# Patient Record
Sex: Female | Born: 1960
Health system: Southern US, Community
[De-identification: ages and names within clinical notes are randomized; demographics above are authoritative.]

## PROBLEM LIST (undated history)

## (undated) DIAGNOSIS — K449 Diaphragmatic hernia without obstruction or gangrene: Secondary | ICD-10-CM

## (undated) DIAGNOSIS — B351 Tinea unguium: Secondary | ICD-10-CM

## (undated) DIAGNOSIS — R630 Anorexia: Secondary | ICD-10-CM

## (undated) DIAGNOSIS — F329 Major depressive disorder, single episode, unspecified: Secondary | ICD-10-CM

## (undated) DIAGNOSIS — F419 Anxiety disorder, unspecified: Secondary | ICD-10-CM

## (undated) DIAGNOSIS — K562 Volvulus: Secondary | ICD-10-CM

## (undated) DIAGNOSIS — I8390 Asymptomatic varicose veins of unspecified lower extremity: Secondary | ICD-10-CM

## (undated) DIAGNOSIS — K589 Irritable bowel syndrome without diarrhea: Secondary | ICD-10-CM

## (undated) DIAGNOSIS — Z9889 Other specified postprocedural states: Secondary | ICD-10-CM

## (undated) DIAGNOSIS — I83893 Varicose veins of bilateral lower extremities with other complications: Secondary | ICD-10-CM

## (undated) DIAGNOSIS — D72820 Lymphocytosis (symptomatic): Secondary | ICD-10-CM

## (undated) DIAGNOSIS — R7303 Prediabetes: Secondary | ICD-10-CM

## (undated) DIAGNOSIS — F32A Depression, unspecified: Secondary | ICD-10-CM

## (undated) DIAGNOSIS — Z460 Encounter for fitting and adjustment of spectacles and contact lenses: Secondary | ICD-10-CM

## (undated) DIAGNOSIS — T7840XA Allergy, unspecified, initial encounter: Secondary | ICD-10-CM

## (undated) DIAGNOSIS — Z8669 Personal history of other diseases of the nervous system and sense organs: Secondary | ICD-10-CM

## (undated) DIAGNOSIS — G4730 Sleep apnea, unspecified: Secondary | ICD-10-CM

## (undated) DIAGNOSIS — K219 Gastro-esophageal reflux disease without esophagitis: Secondary | ICD-10-CM

## (undated) DIAGNOSIS — M549 Dorsalgia, unspecified: Secondary | ICD-10-CM

## (undated) DIAGNOSIS — R5383 Other fatigue: Secondary | ICD-10-CM

## (undated) DIAGNOSIS — Z8679 Personal history of other diseases of the circulatory system: Secondary | ICD-10-CM

## (undated) DIAGNOSIS — R32 Unspecified urinary incontinence: Secondary | ICD-10-CM

## (undated) HISTORY — DX: Anxiety disorder, unspecified: F41.9

## (undated) HISTORY — DX: Lymphocytosis (symptomatic): D72.820

## (undated) HISTORY — DX: Unspecified urinary incontinence: R32

## (undated) HISTORY — DX: Other specified postprocedural states: Z98.890

## (undated) HISTORY — DX: Irritable bowel syndrome, unspecified: K58.9

## (undated) HISTORY — PX: NASAL SEPTUM SURGERY: SHX37

## (undated) HISTORY — DX: Personal history of other diseases of the circulatory system: Z86.79

## (undated) HISTORY — DX: Prediabetes: R73.03

## (undated) HISTORY — DX: Gastro-esophageal reflux disease without esophagitis: K21.9

## (undated) HISTORY — DX: Volvulus: K56.2

## (undated) HISTORY — DX: Asymptomatic varicose veins of unspecified lower extremity: I83.90

## (undated) HISTORY — DX: Varicose veins of bilateral lower extremities with other complications: I83.893

## (undated) HISTORY — PX: UPPER GASTROINTESTINAL ENDOSCOPY: SHX188

## (undated) HISTORY — DX: Dorsalgia, unspecified: M54.9

## (undated) HISTORY — PX: EYE SURGERY: SHX253

## (undated) HISTORY — DX: Allergy, unspecified, initial encounter: T78.40XA

## (undated) HISTORY — DX: Encounter for fitting and adjustment of spectacles and contact lenses: Z46.0

## (undated) HISTORY — DX: Other fatigue: R53.83

## (undated) HISTORY — DX: Tinea unguium: B35.1

## (undated) HISTORY — DX: Anorexia: R63.0

## (undated) HISTORY — DX: Personal history of other diseases of the nervous system and sense organs: Z86.69

## (undated) HISTORY — DX: Diaphragmatic hernia without obstruction or gangrene: K44.9

## (undated) HISTORY — PX: COLONOSCOPY: SHX174

## (undated) HISTORY — DX: Sleep apnea, unspecified: G47.30

## (undated) HISTORY — PX: BALLOON SINUPLASTY: SHX5740

---

## 1987-12-13 DIAGNOSIS — Z8679 Personal history of other diseases of the circulatory system: Secondary | ICD-10-CM

## 1987-12-13 DIAGNOSIS — Z8774 Personal history of (corrected) congenital malformations of heart and circulatory system: Secondary | ICD-10-CM

## 1987-12-13 HISTORY — DX: Personal history of other diseases of the circulatory system: Z86.79

## 1987-12-13 HISTORY — DX: Personal history of (corrected) congenital malformations of heart and circulatory system: Z87.74

## 1988-01-08 DIAGNOSIS — Q211 Atrial septal defect: Secondary | ICD-10-CM | POA: Insufficient documentation

## 1988-01-08 DIAGNOSIS — Q2111 Secundum atrial septal defect: Secondary | ICD-10-CM | POA: Insufficient documentation

## 1988-12-12 HISTORY — PX: ASD REPAIR, SECUNDUM: SHX1195

## 1999-06-07 ENCOUNTER — Ambulatory Visit (HOSPITAL_COMMUNITY): Admission: RE | Admit: 1999-06-07 | Discharge: 1999-06-07 | Payer: Self-pay | Admitting: *Deleted

## 2000-04-17 ENCOUNTER — Other Ambulatory Visit: Admission: RE | Admit: 2000-04-17 | Discharge: 2000-04-17 | Payer: Self-pay | Admitting: Gynecology

## 2001-05-01 ENCOUNTER — Other Ambulatory Visit: Admission: RE | Admit: 2001-05-01 | Discharge: 2001-05-01 | Payer: Self-pay | Admitting: Gynecology

## 2001-05-22 ENCOUNTER — Encounter: Admission: RE | Admit: 2001-05-22 | Discharge: 2001-05-22 | Payer: Self-pay | Admitting: Gynecology

## 2001-05-22 ENCOUNTER — Encounter: Payer: Self-pay | Admitting: Gynecology

## 2002-07-30 ENCOUNTER — Other Ambulatory Visit: Admission: RE | Admit: 2002-07-30 | Discharge: 2002-07-30 | Payer: Self-pay | Admitting: *Deleted

## 2002-10-24 ENCOUNTER — Encounter: Admission: RE | Admit: 2002-10-24 | Discharge: 2002-10-24 | Payer: Self-pay | Admitting: *Deleted

## 2002-10-24 ENCOUNTER — Encounter: Payer: Self-pay | Admitting: *Deleted

## 2002-12-12 HISTORY — PX: ABDOMINAL HYSTERECTOMY: SHX81

## 2003-01-28 ENCOUNTER — Observation Stay (HOSPITAL_COMMUNITY): Admission: RE | Admit: 2003-01-28 | Discharge: 2003-01-29 | Payer: Self-pay | Admitting: *Deleted

## 2003-01-28 ENCOUNTER — Encounter (INDEPENDENT_AMBULATORY_CARE_PROVIDER_SITE_OTHER): Payer: Self-pay

## 2003-04-23 ENCOUNTER — Ambulatory Visit (HOSPITAL_COMMUNITY): Admission: RE | Admit: 2003-04-23 | Discharge: 2003-04-23 | Payer: Self-pay | Admitting: Gastroenterology

## 2003-04-23 ENCOUNTER — Encounter: Payer: Self-pay | Admitting: Gastroenterology

## 2003-08-05 ENCOUNTER — Other Ambulatory Visit: Admission: RE | Admit: 2003-08-05 | Discharge: 2003-08-05 | Payer: Self-pay | Admitting: *Deleted

## 2003-10-30 ENCOUNTER — Ambulatory Visit (HOSPITAL_COMMUNITY): Admission: RE | Admit: 2003-10-30 | Discharge: 2003-10-30 | Payer: Self-pay | Admitting: *Deleted

## 2004-08-05 ENCOUNTER — Other Ambulatory Visit: Admission: RE | Admit: 2004-08-05 | Discharge: 2004-08-05 | Payer: Self-pay | Admitting: *Deleted

## 2004-10-28 ENCOUNTER — Ambulatory Visit: Payer: Self-pay | Admitting: Pulmonary Disease

## 2004-11-11 ENCOUNTER — Ambulatory Visit (HOSPITAL_COMMUNITY): Admission: RE | Admit: 2004-11-11 | Discharge: 2004-11-11 | Payer: Self-pay | Admitting: *Deleted

## 2004-11-24 ENCOUNTER — Ambulatory Visit: Payer: Self-pay | Admitting: Pulmonary Disease

## 2004-12-01 ENCOUNTER — Ambulatory Visit: Payer: Self-pay | Admitting: Pulmonary Disease

## 2005-01-03 ENCOUNTER — Ambulatory Visit: Payer: Self-pay | Admitting: Pulmonary Disease

## 2005-01-04 ENCOUNTER — Ambulatory Visit: Payer: Self-pay | Admitting: Pulmonary Disease

## 2005-01-06 ENCOUNTER — Ambulatory Visit: Payer: Self-pay | Admitting: Pulmonary Disease

## 2005-01-06 ENCOUNTER — Ambulatory Visit: Payer: Self-pay | Admitting: Gastroenterology

## 2005-08-10 ENCOUNTER — Other Ambulatory Visit: Admission: RE | Admit: 2005-08-10 | Discharge: 2005-08-10 | Payer: Self-pay | Admitting: *Deleted

## 2005-09-19 ENCOUNTER — Ambulatory Visit: Payer: Self-pay | Admitting: Pulmonary Disease

## 2005-09-21 ENCOUNTER — Ambulatory Visit (HOSPITAL_COMMUNITY): Admission: RE | Admit: 2005-09-21 | Discharge: 2005-09-21 | Payer: Self-pay | Admitting: Pulmonary Disease

## 2005-09-29 ENCOUNTER — Ambulatory Visit: Payer: Self-pay | Admitting: Pulmonary Disease

## 2006-01-02 ENCOUNTER — Encounter: Admission: RE | Admit: 2006-01-02 | Discharge: 2006-01-02 | Payer: Self-pay | Admitting: *Deleted

## 2006-02-21 ENCOUNTER — Ambulatory Visit: Payer: Self-pay | Admitting: Pulmonary Disease

## 2006-02-28 ENCOUNTER — Ambulatory Visit: Payer: Self-pay | Admitting: Gastroenterology

## 2006-04-11 ENCOUNTER — Ambulatory Visit: Payer: Self-pay | Admitting: Gastroenterology

## 2006-04-11 DIAGNOSIS — K562 Volvulus: Secondary | ICD-10-CM

## 2006-04-11 HISTORY — DX: Volvulus: K56.2

## 2006-05-03 ENCOUNTER — Emergency Department (HOSPITAL_COMMUNITY): Admission: EM | Admit: 2006-05-03 | Discharge: 2006-05-04 | Payer: Self-pay | Admitting: Emergency Medicine

## 2006-05-22 ENCOUNTER — Ambulatory Visit: Payer: Self-pay | Admitting: Gastroenterology

## 2006-08-16 ENCOUNTER — Other Ambulatory Visit: Admission: RE | Admit: 2006-08-16 | Discharge: 2006-08-16 | Payer: Self-pay | Admitting: *Deleted

## 2007-01-03 ENCOUNTER — Encounter: Admission: RE | Admit: 2007-01-03 | Discharge: 2007-01-03 | Payer: Self-pay | Admitting: *Deleted

## 2007-03-07 ENCOUNTER — Ambulatory Visit: Payer: Self-pay | Admitting: Pulmonary Disease

## 2007-03-07 LAB — CONVERTED CEMR LAB
ALT: 26 units/L (ref 0–40)
Albumin: 3.9 g/dL (ref 3.5–5.2)
Alkaline Phosphatase: 35 units/L — ABNORMAL LOW (ref 39–117)
BUN: 12 mg/dL (ref 6–23)
Basophils Absolute: 0.1 10*3/uL (ref 0.0–0.1)
Bilirubin Urine: NEGATIVE
Bilirubin, Direct: 0.2 mg/dL (ref 0.0–0.3)
CO2: 32 meq/L (ref 19–32)
Calcium: 9.2 mg/dL (ref 8.4–10.5)
Creatinine, Ser: 0.7 mg/dL (ref 0.4–1.2)
Eosinophils Absolute: 0.1 10*3/uL (ref 0.0–0.6)
Eosinophils Relative: 2.4 % (ref 0.0–5.0)
Hemoglobin: 14.5 g/dL (ref 12.0–15.0)
MCHC: 33.6 g/dL (ref 30.0–36.0)
Monocytes Relative: 9.8 % (ref 3.0–11.0)
Neutrophils Relative %: 62.4 % (ref 43.0–77.0)
Nitrite: NEGATIVE
Platelets: 333 10*3/uL (ref 150–400)
RBC: 4.77 M/uL (ref 3.87–5.11)
Sodium: 144 meq/L (ref 135–145)
Total Protein: 6.8 g/dL (ref 6.0–8.3)
Urine Glucose: NEGATIVE mg/dL
Urobilinogen, UA: 0.2 (ref 0.0–1.0)

## 2007-08-30 ENCOUNTER — Other Ambulatory Visit: Admission: RE | Admit: 2007-08-30 | Discharge: 2007-08-30 | Payer: Self-pay | Admitting: *Deleted

## 2007-09-05 ENCOUNTER — Ambulatory Visit: Payer: Self-pay | Admitting: Vascular Surgery

## 2007-10-05 ENCOUNTER — Ambulatory Visit: Payer: Self-pay | Admitting: Gastroenterology

## 2007-10-29 ENCOUNTER — Ambulatory Visit: Payer: Self-pay | Admitting: Pulmonary Disease

## 2007-10-29 LAB — CONVERTED CEMR LAB
ALT: 24 units/L (ref 0–35)
Albumin: 3.9 g/dL (ref 3.5–5.2)
Alkaline Phosphatase: 32 units/L — ABNORMAL LOW (ref 39–117)
Basophils Absolute: 0.1 10*3/uL (ref 0.0–0.1)
Chloride: 105 meq/L (ref 96–112)
Eosinophils Absolute: 0.1 10*3/uL (ref 0.0–0.6)
Eosinophils Relative: 1.2 % (ref 0.0–5.0)
Folate: 17 ng/mL
GFR calc Af Amer: 115 mL/min
GFR calc non Af Amer: 95 mL/min
HCT: 41 % (ref 36.0–46.0)
Hemoglobin: 14.5 g/dL (ref 12.0–15.0)
Lymphocytes Relative: 27 % (ref 12.0–46.0)
Neutro Abs: 3.9 10*3/uL (ref 1.4–7.7)
Neutrophils Relative %: 60.9 % (ref 43.0–77.0)
Platelets: 331 10*3/uL (ref 150–400)
Potassium: 3.8 meq/L (ref 3.5–5.1)
RBC: 4.42 M/uL (ref 3.87–5.11)
Sodium: 140 meq/L (ref 135–145)

## 2007-11-02 ENCOUNTER — Ambulatory Visit: Payer: Self-pay | Admitting: Vascular Surgery

## 2007-11-21 ENCOUNTER — Ambulatory Visit: Payer: Self-pay | Admitting: Vascular Surgery

## 2007-11-23 ENCOUNTER — Ambulatory Visit: Payer: Self-pay | Admitting: Vascular Surgery

## 2007-12-13 HISTORY — PX: OTHER SURGICAL HISTORY: SHX169

## 2008-01-04 ENCOUNTER — Ambulatory Visit: Payer: Self-pay | Admitting: Vascular Surgery

## 2008-01-07 ENCOUNTER — Telehealth (INDEPENDENT_AMBULATORY_CARE_PROVIDER_SITE_OTHER): Payer: Self-pay | Admitting: *Deleted

## 2008-01-07 DIAGNOSIS — F419 Anxiety disorder, unspecified: Secondary | ICD-10-CM | POA: Insufficient documentation

## 2008-01-07 DIAGNOSIS — Z87898 Personal history of other specified conditions: Secondary | ICD-10-CM | POA: Insufficient documentation

## 2008-01-07 DIAGNOSIS — K589 Irritable bowel syndrome without diarrhea: Secondary | ICD-10-CM | POA: Insufficient documentation

## 2008-01-08 ENCOUNTER — Ambulatory Visit: Payer: Self-pay | Admitting: Pulmonary Disease

## 2008-01-08 DIAGNOSIS — M79609 Pain in unspecified limb: Secondary | ICD-10-CM | POA: Insufficient documentation

## 2008-01-08 DIAGNOSIS — K562 Volvulus: Secondary | ICD-10-CM | POA: Insufficient documentation

## 2008-01-08 DIAGNOSIS — I8390 Asymptomatic varicose veins of unspecified lower extremity: Secondary | ICD-10-CM | POA: Insufficient documentation

## 2008-02-29 ENCOUNTER — Ambulatory Visit: Payer: Self-pay | Admitting: Vascular Surgery

## 2008-03-06 ENCOUNTER — Ambulatory Visit: Payer: Self-pay | Admitting: Pulmonary Disease

## 2008-03-06 DIAGNOSIS — N809 Endometriosis, unspecified: Secondary | ICD-10-CM | POA: Insufficient documentation

## 2008-03-14 ENCOUNTER — Telehealth: Payer: Self-pay | Admitting: Pulmonary Disease

## 2008-03-23 LAB — CONVERTED CEMR LAB
Bilirubin Urine: NEGATIVE
Crystals: NEGATIVE
Hemoglobin, Urine: NEGATIVE
Nitrite: NEGATIVE
RBC / HPF: NONE SEEN
Specific Gravity, Urine: 1.01 (ref 1.000–1.03)
Total CHOL/HDL Ratio: 3.8
Urine Glucose: NEGATIVE mg/dL
Urobilinogen, UA: 0.2 (ref 0.0–1.0)
VLDL: 11 mg/dL (ref 0–40)

## 2008-03-28 ENCOUNTER — Encounter: Payer: Self-pay | Admitting: Pulmonary Disease

## 2008-04-04 ENCOUNTER — Ambulatory Visit (HOSPITAL_COMMUNITY): Admission: RE | Admit: 2008-04-04 | Discharge: 2008-04-04 | Payer: Self-pay | Admitting: *Deleted

## 2008-04-18 ENCOUNTER — Ambulatory Visit: Payer: Self-pay | Admitting: Vascular Surgery

## 2008-08-22 ENCOUNTER — Ambulatory Visit: Payer: Self-pay | Admitting: Gastroenterology

## 2008-08-22 DIAGNOSIS — R197 Diarrhea, unspecified: Secondary | ICD-10-CM | POA: Insufficient documentation

## 2008-09-17 ENCOUNTER — Ambulatory Visit: Payer: Self-pay | Admitting: Pulmonary Disease

## 2008-10-23 ENCOUNTER — Other Ambulatory Visit: Admission: RE | Admit: 2008-10-23 | Discharge: 2008-10-23 | Payer: Self-pay | Admitting: Gynecology

## 2008-12-12 HISTORY — PX: OTHER SURGICAL HISTORY: SHX169

## 2009-01-27 ENCOUNTER — Telehealth: Payer: Self-pay | Admitting: Pulmonary Disease

## 2009-04-14 ENCOUNTER — Ambulatory Visit: Payer: Self-pay | Admitting: Pulmonary Disease

## 2009-04-17 ENCOUNTER — Ambulatory Visit: Payer: Self-pay | Admitting: Pulmonary Disease

## 2009-04-17 LAB — CONVERTED CEMR LAB
ALT: 24 units/L (ref 0–35)
AST: 23 units/L (ref 0–37)
Albumin: 3.8 g/dL (ref 3.5–5.2)
Basophils Relative: 0.1 % (ref 0.0–3.0)
Bilirubin, Direct: 0.3 mg/dL (ref 0.0–0.3)
Calcium: 8.8 mg/dL (ref 8.4–10.5)
Chloride: 106 meq/L (ref 96–112)
Creatinine, Ser: 0.8 mg/dL (ref 0.4–1.2)
Eosinophils Relative: 3.5 % (ref 0.0–5.0)
HDL: 42.5 mg/dL (ref >39.00)
Leukocytes, UA: NEGATIVE
Lymphocytes Relative: 18.4 % (ref 12.0–46.0)
MCHC: 34 g/dL (ref 30.0–36.0)
MCV: 94.3 fL (ref 78.0–100.0)
Monocytes Relative: 7.6 % (ref 3.0–12.0)
Neutro Abs: 5.8 10*3/uL (ref 1.4–7.7)
Platelets: 282 10*3/uL (ref 150.0–400.0)
RBC: 4.41 M/uL (ref 3.87–5.11)
RDW: 12.2 % (ref 11.5–14.6)
Total CHOL/HDL Ratio: 3
Total Protein, Urine: NEGATIVE mg/dL
Triglycerides: 45 mg/dL (ref 0.0–149.0)
Urine Glucose: NEGATIVE mg/dL
Urobilinogen, UA: 0.2 (ref 0.0–1.0)
VLDL: 9 mg/dL (ref 0.0–40.0)

## 2009-04-22 ENCOUNTER — Ambulatory Visit (HOSPITAL_COMMUNITY): Admission: RE | Admit: 2009-04-22 | Discharge: 2009-04-22 | Payer: Self-pay | Admitting: Gynecology

## 2009-08-31 ENCOUNTER — Telehealth: Payer: Self-pay | Admitting: Pulmonary Disease

## 2009-09-21 ENCOUNTER — Telehealth (INDEPENDENT_AMBULATORY_CARE_PROVIDER_SITE_OTHER): Payer: Self-pay | Admitting: *Deleted

## 2009-10-08 ENCOUNTER — Ambulatory Visit: Payer: Self-pay | Admitting: Pulmonary Disease

## 2009-11-10 ENCOUNTER — Telehealth: Payer: Self-pay | Admitting: Pulmonary Disease

## 2009-11-12 ENCOUNTER — Ambulatory Visit: Payer: Self-pay | Admitting: Pulmonary Disease

## 2009-11-12 DIAGNOSIS — M549 Dorsalgia, unspecified: Secondary | ICD-10-CM | POA: Insufficient documentation

## 2010-03-11 ENCOUNTER — Ambulatory Visit: Payer: Self-pay | Admitting: Pulmonary Disease

## 2010-03-11 DIAGNOSIS — J309 Allergic rhinitis, unspecified: Secondary | ICD-10-CM | POA: Insufficient documentation

## 2010-05-17 ENCOUNTER — Telehealth: Payer: Self-pay | Admitting: Pulmonary Disease

## 2010-05-21 ENCOUNTER — Encounter: Payer: Self-pay | Admitting: Pulmonary Disease

## 2010-05-26 ENCOUNTER — Ambulatory Visit: Payer: Self-pay | Admitting: Pulmonary Disease

## 2010-05-26 DIAGNOSIS — B351 Tinea unguium: Secondary | ICD-10-CM | POA: Insufficient documentation

## 2010-06-30 ENCOUNTER — Telehealth: Payer: Self-pay | Admitting: Pulmonary Disease

## 2010-07-23 ENCOUNTER — Ambulatory Visit: Payer: Self-pay | Admitting: Pulmonary Disease

## 2010-11-30 ENCOUNTER — Encounter: Payer: Self-pay | Admitting: Gastroenterology

## 2010-12-02 ENCOUNTER — Encounter: Payer: Self-pay | Admitting: Gastroenterology

## 2010-12-02 DIAGNOSIS — R1012 Left upper quadrant pain: Secondary | ICD-10-CM | POA: Insufficient documentation

## 2010-12-03 ENCOUNTER — Telehealth: Payer: Self-pay | Admitting: Gastroenterology

## 2010-12-03 ENCOUNTER — Ambulatory Visit: Payer: Self-pay | Admitting: Gastroenterology

## 2010-12-03 ENCOUNTER — Ambulatory Visit (HOSPITAL_COMMUNITY)
Admission: RE | Admit: 2010-12-03 | Discharge: 2010-12-03 | Payer: Self-pay | Source: Home / Self Care | Attending: Gastroenterology | Admitting: Gastroenterology

## 2010-12-07 ENCOUNTER — Telehealth: Payer: Self-pay | Admitting: Gastroenterology

## 2010-12-12 HISTORY — PX: TRANSTHORACIC ECHOCARDIOGRAM: SHX275

## 2010-12-16 ENCOUNTER — Telehealth (INDEPENDENT_AMBULATORY_CARE_PROVIDER_SITE_OTHER): Payer: Self-pay | Admitting: *Deleted

## 2010-12-16 ENCOUNTER — Telehealth: Payer: Self-pay | Admitting: Gastroenterology

## 2010-12-20 ENCOUNTER — Ambulatory Visit
Admission: RE | Admit: 2010-12-20 | Discharge: 2010-12-20 | Payer: Self-pay | Source: Home / Self Care | Attending: Pulmonary Disease | Admitting: Pulmonary Disease

## 2010-12-20 DIAGNOSIS — R002 Palpitations: Secondary | ICD-10-CM | POA: Insufficient documentation

## 2010-12-22 ENCOUNTER — Telehealth: Payer: Self-pay | Admitting: Gastroenterology

## 2010-12-24 ENCOUNTER — Encounter: Payer: Self-pay | Admitting: Pulmonary Disease

## 2010-12-26 ENCOUNTER — Encounter: Payer: Self-pay | Admitting: Gastroenterology

## 2010-12-27 ENCOUNTER — Ambulatory Visit (HOSPITAL_COMMUNITY)
Admission: RE | Admit: 2010-12-27 | Discharge: 2010-12-27 | Payer: Self-pay | Source: Home / Self Care | Attending: Gastroenterology | Admitting: Gastroenterology

## 2010-12-28 ENCOUNTER — Ambulatory Visit (HOSPITAL_COMMUNITY)
Admission: RE | Admit: 2010-12-28 | Discharge: 2010-12-28 | Payer: Self-pay | Source: Home / Self Care | Attending: Gastroenterology | Admitting: Gastroenterology

## 2010-12-29 ENCOUNTER — Telehealth: Payer: Self-pay | Admitting: Gastroenterology

## 2010-12-30 ENCOUNTER — Ambulatory Visit
Admission: RE | Admit: 2010-12-30 | Discharge: 2010-12-30 | Payer: Self-pay | Source: Home / Self Care | Attending: Gastroenterology | Admitting: Gastroenterology

## 2010-12-30 ENCOUNTER — Ambulatory Visit: Admit: 2010-12-30 | Payer: Self-pay | Admitting: Cardiovascular Disease

## 2010-12-30 ENCOUNTER — Encounter: Payer: Self-pay | Admitting: Gastroenterology

## 2010-12-31 ENCOUNTER — Encounter: Payer: Self-pay | Admitting: Gastroenterology

## 2011-01-04 ENCOUNTER — Telehealth: Payer: Self-pay | Admitting: Gastroenterology

## 2011-01-11 NOTE — Assessment & Plan Note (Signed)
Summary: review recent spirometry/ok per SN/lmr   Primary Care Provider:  Lorin Picket Lunden Mcleish,MD  CC:  5 month ROV & add-on to review PFT....  History of Present Illness: [name] here for a CPX...  she has multiple medical problems as noted below...    ~  Apr 17, 2009:  she has been doing well overall- it turns out that her right ankle was fractured while hiking in 2008, and they didn't find it for 6 months- finally had right ankle surgery w/ pinning 2/09 by DrRendall... the leg swelling & discomfort have all resolved w/ yoga, accupuncture,  & exercise... she is also s/p right leg laser ablation (saphenous endovenous laser ablation) 12/08 by DrEarly... she saw DrPatterson 9/09 for IBS w/ constip- improved w/ fiber & miralax... he checked celiac dis antibody test (all neg) but she states all symptoms resolved on a wheat-free diet...   ~  October 09, 2009:  Add-on to review PFT's- she works at [employer] and does some inspection that required her to wear a mask ("respirator")... she had employer sponsored PFT's at [facility] 09/21/09 showing FVC= 4.86 (109%), FEV1= 3.79 (107%), %1sec= 78, & mid-flows= 83% predicted... these are normal numbers but the [employer] PA suggested a medical eval (?why)...  pt has no hx resp problems other than some mild allergies, never smoked, etc... she is cleared to wear the respirator & letter written today.    Current Problems:   PHYSICAL EXAMINATION (ICD-V70.0) - she is up-to-date on needed screening procedures... GYN= DrMezer every Aug w/ PAP, Mammogram, & she's had one BMD (due to mother's hx)- told normal.  ALLERGY (ICD-995.3) - prev on shots per DrESL, off for some time now and doing satis... takes ZYRTEK 10mg /d.  Hx of ATRIAL SEPTAL DEFECT (ICD-745.5) - S/P ASD repair 1992 by DrGearhardt... doing well without CP, palpit, dizzy, syncope, dyspnea etc... 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  VARICOSE VEINS, LOWER EXTREMITIES (ICD-454.9) - full eval by  DrEarly...  ~  11/08 LE Venous Reflux Exam showed right GSV reflux  ~  s/p laser surg 12/08 w/ laser ablation right GSV by DrEarly & improved...  IRRITABLE BOWEL SYNDROME (ICD-564.1) - hx severe constipation, redundant colon & hx cecal volvulus... she's been taking MIRALAX & Metamucil w/ control of symptoms... last saw DrPatterson 9/09 & note reviewed...  ~  normal colonoscopy 5/04 by DrPatterson.   Hx of INTESTINAL VOLVULUS, LARGE BOWEL (ICD-560.2) - cecal volvulus 5/07 on CT in ER, resolved after BE without surgery.  Hx of ENDOMETRIOSIS (ICD-617.9) - s/p hysterectomy for severe endometriosis problem.  BACK PAIN, LUMBAR (ICD-724.2) - eval by DrGioffre and DrRamos in 2006.  LEG PAIN (ICD-729.5) - eval by DrEarly and DrRendall... MRI w/ "chipped bone" and DrRendall did arthroscopic surg right ankle 2/09... all symptoms resolved w/ yoga, accupuncture, and exercise...  MIGRAINES, HX OF (ICD-V13.8) - on RELPAX per DrFreeman- last seen  ~8/09 & doing well...  ANXIETY (ICD-300.00) - not currently on meds.    Allergies: 1)  ! Codeine 2)  ! Penicillin  Comments:  Nurse/Medical Assistant: The patient's medications and allergies were reviewed with the patient and were updated in the Medication and Allergy Lists.  Past History:  Past Medical History: ALLERGY (ICD-995.3) Hx of ATRIAL SEPTAL DEFECT (ICD-745.5) VARICOSE VEINS, LOWER EXTREMITIES (ICD-454.9) IRRITABLE BOWEL SYNDROME (ICD-564.1) Hx of INTESTINAL VOLVULUS, LARGE BOWEL (ICD-560.2) Hx of ENDOMETRIOSIS (ICD-617.9) BACK PAIN, LUMBAR (ICD-724.2) LEG PAIN (ICD-729.5) MIGRAINES, HX OF (ICD-V13.8) ANXIETY (ICD-300.00)  Past Surgical History:  S/P Repair of ASD S/P Hysterectomy for severe endometriosis Right Leg fracture with surgery Varicose laser vein surgery  Family History: Reviewed history from 08/22/2008 and no changes required. Lung cancer: Maternal grandmother No FH of Colon Cancer Family History of Diabetes:  Mother Family History of Heart Disease: Maternal Grandfather  Social History: Reviewed history from 08/22/2008 and no changes required. Married Patient has never smoked.  Alcohol Use - yes Daily Caffeine Use Illicit Drug Use - no  Review of Systems  The patient denies anorexia, fever, weight loss, weight gain, vision loss, decreased hearing, hoarseness, chest pain, syncope, dyspnea on exertion, peripheral edema, prolonged cough, headaches, hemoptysis, abdominal pain, melena, hematochezia, severe indigestion/heartburn, hematuria, incontinence, muscle weakness, suspicious skin lesions, transient blindness, difficulty walking, depression, unusual weight change, abnormal bleeding, enlarged lymph nodes, and angioedema.    Vital Signs:  Patient profile:   50 year old female Height:      71 inches Weight:      174.38 pounds BMI:     24.41 O2 Sat:      100 % on Room air Temp:     98.0 degrees F oral Pulse rate:   62 / minute BP sitting:   110 / 60  (left arm) Cuff size:   regular  Vitals Entered By: Marijo File CMA (October 08, 2009 11:56 AM)  O2 Sat at Rest %:  100 O2 Flow:  Room air CC: 5 month ROV & add-on to review PFT... Comments meds updated today   Physical Exam  Additional Exam:  WD, WN, [name] in NAD... GENERAL:  Alert & oriented; pleasant & cooperative... HEENT:  [place]/AT, EOM-wnl, PERRLA, Fundi-benign, EACs-clear, TMs-wnl, NOSE-clear, THROAT-clear & wnl. NECK:  Supple w/ full ROM; no JVD; normal carotid impulses w/o bruits; no thyromegaly or nodules palpated; no lymphadenopathy. CHEST:  Clear to P & A; without wheezes/ rales/ or rhonchi... s/p ASD repair 1992. HEART:  Regular Rhythm; gr 1/6 SEM without rubs or gallops... ABDOMEN:  Soft & nontender; normal bowel sounds; no organomegaly or masses detected. EXT: without deformities or arthritic changes; +vv laser surg, ven insuffic, no edema... NEURO:  CN's intact; motor testing normal; sensory testing normal; gait  normal & balance OK. DERM:  No lesions noted; no rash etc...     Impression & Recommendations:  Problem # 1:  PHYSICAL EXAMINATION (ICD-V70.0) Her chest is clear, PFT's at [facility] 10/11 reviewed and are WNL.Marland KitchenMarland Kitchen Letter written to company OK'ing the ret to work & use of the "respirator"...  Problem # 2:  Hx of ATRIAL SEPTAL DEFECT (ICD-745.5) Aware-  followed by Cards, stable...  Problem # 3:  VARICOSE VEINS, LOWER EXTREMITIES (ICD-454.9) Stable s/p laser Rx by DrEarly...  Problem # 4:  BACK PAIN, LUMBAR (ICD-724.2) Followed for Ortho by DrRendall...  Problem # 5:  OTHER MEDICAL PROBLEMS AS NOTED>>>  Complete Medication List: 1)  Zyrtec Allergy 10 Mg Tabs (Cetirizine hcl) .... Take 1 tablet by mouth once a day 2)  Coq10 100 Mg Caps (Coenzyme q10) .... Take 1 tablet by mouth once a day 3)  Miralax Powd (Polyethylene glycol 3350) .... Take 1 teaspoon by mouth as needed 4)  Metamucil Plus Calcium Caps (Psyllium-calcium) .... As needed 5)  Cvs Evening Primrose Oil 500 Mg Caps (Evening primrose oil) .... Take one capsule by mouth at bedtime 6)  Multivitamins Tabs (Multiple vitamin) .... Take 1 tablet by mouth once a day 7)  B Complex 100 Tabs (B complex vitamins) .[name].. 1 tab daily.Marland KitchenMarland Kitchen 8)  Relpax 40 Mg Tabs (Eletriptan hydrobromide) .... One tablet by mouth as needed 9)  Ambien 10 Mg Tabs (Zolpidem tartrate) .... Take one tablet by mouth at bedtime as needed 10)  Xanax 0.5 Mg Tabs (Alprazolam) .... Take 1/2 to 1 tablet by mouth three times a day as needed for nerves  Other Orders: Admin 1st Vaccine (63875) Flu Vaccine 32yrs + (64332) Flu Vaccine Consent Questions     Do you have a history of severe allergic reactions to this vaccine? no    Any prior history of allergic reactions to egg and/or gelatin? no    Do you have a sensitivity to the preservative Thimersol? no    Do you have a past history of Guillan-Barre Syndrome? no    Do you currently have an acute febrile illness? no     Have you ever had a severe reaction to latex? no    Vaccine information given and explained to patient? yes    Are you currently pregnant? no    Lot Number:[id number]   Exp Date:06/10/2010   Site Given  Right  Deltoid IM injection given by Caryl Asp, RN Marijo File CMA  October 08, 2009 2:01 PM [id number]]     .lbflu

## 2011-01-11 NOTE — Procedures (Signed)
Summary: Gastroenterology COLON  Gastroenterology COLON   Imported By: Lowry Ram CMA 02/07/2008 15:21:37  _____________________________________________________________________  External Attachment:    Type:   Image     Comment:   External Document

## 2011-01-11 NOTE — Assessment & Plan Note (Signed)
Summary: cpx/apc   Primary Care Provider:  Lorin Picket Michie Molnar,MD  CC:  6 month ROV & yearly CPX....  History of Present Illness: [name] here for a follow up visit...  she has multiple medical problems as noted below...    ~  May10:  she has been doing well overall- it turns out that her right ankle was fractured while hiking in 2008, and they didn't find it for 6 months- finally had right ankle surgery w/ pinning 2/09 by DrRendall... the leg swelling & discomfort have all resolved w/ yoga, accupuncture,  & exercise... she is also s/p right leg laser ablation (saphenous endovenous laser ablation) 12/08 by DrEarly... she saw DrPatterson 9/09 for IBS w/ constip- improved w/ fiber & miralax... he checked celiac dis antibody test (all neg) but she states all symptoms resolved on a wheat-free diet...  ~  Oct10:  Add-on to review PFT's- she works at [employer] and does some inspection that required her to wear a mask ("respirator")... she had employer sponsored PFT's at [facility] 09/21/09 showing FVC= 4.86 (109%), FEV1= 3.79 (107%), %1sec= 78, & mid-flows= 83% predicted... these are normal numbers but the [employer] PA suggested a medical eval (?why)...  pt has no hx resp problems other than some mild allergies, never smoked, etc... she is cleared to wear the respirator & letter written today.  ~  Dec10:  Add-on per GYN>> she was recently seen for routine GYN check & noted left sided back discomfort around to her left shoulder- resolved w/ ASA & no recurrence... GYN told her she needed cardiac eval:  no chest pain, palpit, SOB, etc... 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 hx ASD repair 1992 w/ norm coronaries on cath at that time... Risks:  neg Fam Hx- no CAD etc; & neg smoking, HBP, DM, Chol = 0/5 risk factors... baseline EKG shows NSR, WNL>> repeat today NSR, WNL.[name]. we discussed all this in detail.   ~  May 26, 2010:  3mo ROV & CPX- doing well overall and her  CC= onychomycosis on toenails that has been unresponsive to OTC topical Rx... we discussed LAMISIL orally & Rx written today... she denies cardiopulm symptoms, legs are doing well, and no GI complaints...   Current Problems:   PHYSICAL EXAMINATION (ICD-V70.0) - she is up-to-date on needed screening procedures... GYN= DrMezer every Aug w/ PAP, Mammogram, & she's had one BMD (due to mother's hx)- told normal.  ALLERGY (ICD-995.3) - prev on shots per DrESL, off for some time now and doing satis... takes ZYRTEK 10mg /d Prn...  Hx of ATRIAL SEPTAL DEFECT (ICD-745.5) - S/P ASD repair 1992 by DrGearhardt... doing well without CP, palpit, dizzy, syncope, dyspnea etc... 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  ~  12/10:  **see above** no cardiac risk factors, normal resting EKG...  VARICOSE VEINS, LOWER EXTREMITIES (ICD-454.9) - full eval by DrEarly...  ~  11/08 LE Venous Reflux Exam showed right GSV reflux...  ~  s/p laser surg 12/08 w/ laser ablation right GSV by DrEarly & improved.  IRRITABLE BOWEL SYNDROME (ICD-564.1) - hx severe constipation, redundant colon & hx cecal volvulus... she's been taking MIRALAX & Metamucil w/ control of symptoms... last saw DrPatterson 9/09 & note reviewed... she states all symptoms resolved on Gluten free diet.  ~  normal colonoscopy 5/04 by DrPatterson.   Hx of INTESTINAL VOLVULUS, LARGE BOWEL (ICD-560.2) - cecal volvulus 5/07 on CT in ER, resolved after BE without surgery.  Hx of ENDOMETRIOSIS (ICD-617.9) - s/p hysterectomy for severe endometriosis problem.  BACK PAIN, LUMBAR (ICD-724.2) - eval by DrGioffre and DrRamos in 2006.  LEG PAIN (ICD-729.5) - eval by DrEarly and DrRendall... MRI w/ "chipped bone" and DrRendall did arthroscopic surg right ankle 2/09... all symptoms resolved w/ yoga, accupuncture, and exercise...  MIGRAINES, HX OF (ICD-V13.8) - prev on RELPAX per DrFreeman- last seen  ~8/09 & doing well...  ANXIETY (ICD-300.00) - on ALPRAZOLAM 0.5mg   Prn...   Allergies: 1)  ! Codeine 2)  ! Penicillin  Past History:  Past Medical History: ALLERGY (ICD-995.3) Hx of ATRIAL SEPTAL DEFECT (ICD-745.5) VARICOSE VEINS, LOWER EXTREMITIES (ICD-454.9) IRRITABLE BOWEL SYNDROME (ICD-564.1) Hx of INTESTINAL VOLVULUS, LARGE BOWEL (ICD-560.2) Hx of ENDOMETRIOSIS (ICD-617.9) BACK PAIN, LUMBAR (ICD-724.2) LEG PAIN (ICD-729.5) MIGRAINES, HX OF (ICD-V13.8) ANXIETY (ICD-300.00)  Past Surgical History: S/P Repair of ASD S/P Hysterectomy for severe endometriosis Right Leg fracture with surgery Varicose laser vein surgery  Family History: Reviewed history from 11/12/2009 and no changes required. Father alive, estranged & medical hx unknown Mother [name] Covert) alive age 45, hx DJD, back pain, osteoporosis 2 Siblings- Sister Ames Coupe has hx AFib; sister Asher Muir is A/W...  No FH of Colon Cancer Lung cancer: Maternal grandmother Family History of Diabetes: Mother Family History of Heart Disease: Maternal Grandfather  Social History: Reviewed history from 11/12/2009 and no changes required. Married Children Ex-smoker- quit age 12 after 10 yrs of up to 1ppd Social alcohol Avoids caffeine  Review of Systems  The patient denies fever, chills, sweats, anorexia, fatigue, weakness, malaise, weight loss, sleep disorder, blurring, diplopia, eye irritation, eye discharge, vision loss, eye pain, photophobia, earache, ear discharge, tinnitus, decreased hearing, nasal congestion, nosebleeds, sore throat, hoarseness, chest pain, palpitations, syncope, dyspnea on exertion, orthopnea, PND, peripheral edema, cough, dyspnea at rest, excessive sputum, hemoptysis, wheezing, pleurisy, nausea, vomiting, diarrhea, constipation, change in bowel habits, abdominal pain, melena, hematochezia, jaundice, gas/bloating, indigestion/heartburn, dysphagia, odynophagia, dysuria, hematuria, urinary frequency, urinary hesitancy, nocturia, incontinence, back pain, joint pain,  joint swelling, muscle cramps, muscle weakness, stiffness, arthritis, sciatica, restless legs, leg pain at night, leg pain with exertion, rash, itching, dryness, suspicious lesions, paralysis, paresthesias, seizures, tremors, vertigo, transient blindness, frequent falls, frequent headaches, difficulty walking, depression, anxiety, memory loss, confusion, cold intolerance, heat intolerance, polydipsia, polyphagia, polyuria, unusual weight change, abnormal bruising, bleeding, enlarged lymph nodes, urticaria, allergic rash, hay fever, and recurrent infections.    Vital Signs:  Patient profile:   50 year old female Height:      71 inches Weight:      178 pounds BMI:     24.92 O2 Sat:      97 % on Room air Temp:     97.4 degrees F oral Pulse rate:   62 / minute BP sitting:   100 / 60  (right arm) Cuff size:   regular  Vitals Entered By: Randell Loop CMA (May 26, 2010 3:01 PM)  O2 Sat at Rest %:  97 O2 Flow:  Room air  Physical Exam  Additional Exam:  WD, WN, [name] in NAD... GENERAL:  Alert & oriented; pleasant & cooperative... HEENT:  [place]/AT, EOM-wnl, PERRLA, Fundi-benign, EACs-clear, TMs-wnl, NOSE-clear, THROAT-clear & wnl. NECK:  Supple w/ full ROM; no JVD; normal carotid impulses w/o bruits; no thyromegaly or nodules palpated; no lymphadenopathy. CHEST:  Clear to P & A; without wheezes/ rales/ or rhonchi... s/p ASD repair 1992. HEART:  Regular Rhythm; gr 1/6 SEM without rubs or gallops... ABDOMEN:  Soft &  nontender; normal bowel sounds; no organomegaly or masses detected. EXT: without deformities or arthritic changes; +vv laser surg, ven insuffic, no edema... NEURO:  CN's intact; motor testing normal; sensory testing normal; gait normal & balance OK. DERM:  No lesions noted; no rash etc...    CXR  Procedure date:  05/26/2010  Findings:      CHEST - 2 VIEW Comparison: 03/06/2008   Findings: Trachea is midline.  Heart size normal.  Lungs are clear. No pleural fluid.    IMPRESSION: No acute findings.   Read By:  Reyes Ivan.,  M.D.   EKG  Procedure date:  05/26/2010  Findings:      Normal sinus rhythm with rate of:  76/ min... Tracing is WNL, NAD...  SN   MISC. Report  Procedure date:  05/26/2010  Findings:      LAB DATA:  pending from [facility]... We will review & communicate results to pt...  SN   Impression & Recommendations:  Problem # 1:  PHYSICAL EXAMINATION (ICD-V70.0)  Orders: 12 Lead EKG (12 Lead EKG) T-2 View CXR (71020TC)  Problem # 2:  ONYCHOMYCOSIS (ICD-110.1) We discussed systemic Rx w/ LAMISIL 250mg  by mouth once daily... Her updated medication list for this problem includes:    Terbinafine Hcl 250 Mg Tabs (Terbinafine hcl) .[name]... Take 1 tab by mouth once daily...  Problem # 3:  Hx of ATRIAL SEPTAL DEFECT (ICD-745.5) Stable>  no signif CP, palpit, etc...  Problem # 4:  VARICOSE VEINS, LOWER EXTREMITIES (ICD-454.9) S/p treatment by DrEarly & doing fine now...  Problem # 5:  IRRITABLE BOWEL SYNDROME (ICD-564.1) GI is stable & we discussed her constip & redundent colon> rec Miralax, Senakot-S etc....  Problem # 6:  MIGRAINES, HX OF (ICD-V13.8) She is doing well- no pain & hasn't needed the Relpax> she credits the wheat/ gluten free diet...  Problem # 7:  ANXIETY (ICD-300.00) We will refill the Alpraz for Prn use... Her updated medication list for this problem includes:    Xanax 0.5 Mg Tabs (Alprazolam) .[name]... Take 1/2 to 1 tablet by mouth three times a day as needed for nerves  Complete Medication List: 1)  Zyrtec Allergy 10 Mg Tabs (Cetirizine hcl) .... Take 1 tablet by mouth once a day 2)  Coq10 100 Mg Caps (Coenzyme q10) .... Take 1 tablet by mouth once a day 3)  Cvs Evening Primrose Oil 500 Mg Caps (Evening primrose oil) .... Take one capsule by mouth at bedtime 4)  Multivitamins Tabs (Multiple vitamin) .... Take 1 tablet by mouth once a day 5)  B Complex 100 Tabs (B complex vitamins) .[name].. 1 tab  daily.Marland KitchenMarland Kitchen 6)  Xanax 0.5 Mg Tabs (Alprazolam) .... Take 1/2 to 1 tablet by mouth three times a day as needed for nerves 7)  Terbinafine Hcl 250 Mg Tabs (Terbinafine hcl) .... Take 1 tab by mouth once daily...  Patient Instructions: 1)  Today we updated your med list- see below.... 2)  We refilled your Alprazolam for as needed use.Marland KitchenMarland Kitchen 3)  We wrote a new perscription for Lamisil to take once daily for your nail fungus.Marland KitchenMarland Kitchen 4)  Today we did your follow up CXR & EKG... I will review your [facility] Labs when they are available & record a Phone Tree message for you... 5)  Call for any problems... Prescriptions: TERBINAFINE HCL 250 MG TABS (TERBINAFINE HCL) take 1 tab by mouth once daily...  #30 x 5   Entered and Authorized by:   Michele Mcalpine MD  Signed by:   Michele Mcalpine MD on 05/26/2010   Method used:   Print then Give to Patient   RxID:   [id number] Prudy Feeler 0.5 MG TABS (ALPRAZOLAM) take 1/2 to 1 tablet by mouth three times a day as needed for nerves  #90 x 6   Entered and Authorized by:   Michele Mcalpine MD   Signed by:   Michele Mcalpine MD on 05/26/2010   Method used:   Print then Give to Patient   RxID:   [id number]    CardioPerfect ECG  ID: [id number] Patient: Thurston Pounds DOB: 12-Mar-1961 Age: 50 Years Old Sex: Female Race: White Physician: Shawneequa Baldridge Technician: Randell Loop CMA Height: 71 Weight: 178 Status: Unconfirmed Past Medical History:  ALLERGY (ICD-995.3) Hx of ATRIAL SEPTAL DEFECT (ICD-745.5) VARICOSE VEINS, LOWER EXTREMITIES (ICD-454.9) IRRITABLE BOWEL SYNDROME (ICD-564.1) Hx of INTESTINAL VOLVULUS, LARGE BOWEL (ICD-560.2) Hx of ENDOMETRIOSIS (ICD-617.9) BACK PAIN, LUMBAR (ICD-724.2) LEG PAIN (ICD-729.5) MIGRAINES, HX OF (ICD-V13.8) ANXIETY (ICD-300.00)   Recorded: 05/26/2010 3:27 PM P/PR: 103 ms / 177 ms - Heart rate (maximum exercise) QRS: 101 QT/QTc/QTd: 406 ms / 432 ms / 49 ms - Heart rate (maximum exercise)  P/QRS/T axis: 57 deg / 76 deg /  42 deg - Heart rate (maximum exercise)  Heartrate: 75 bpm  Interpretation:  Normal sinus rhythm with rate of:  76/ min... Tracing is WNL, NAD...  SN    Appended Document: cpx/apc lmomtcb to make sure pt called the phonetree for her lab results  Appended Document: cpx/apc pt called me back and she did get the phonetree message from SN---all labs WNL--everything looks great..  pt is aware

## 2011-01-11 NOTE — Assessment & Plan Note (Signed)
Summary: follow up/discuss anxiety/la   Primary Care Provider:  Lorin Picket Nadel,MD  CC:  2 month ROV & add-on for anxiety....  History of Present Illness: [name] here for a follow up visit...  she has multiple medical problems as noted below...    ~  May10:  she has been doing well overall- it turns out that her right ankle was fractured while hiking in 2008, and they didn't find it for 6 months- finally had right ankle surgery w/ pinning 2/09 by DrRendall... the leg swelling & discomfort have all resolved w/ yoga, accupuncture,  & exercise... she is also s/p right leg laser ablation (saphenous endovenous laser ablation) 12/08 by DrEarly... she saw DrPatterson 9/09 for IBS w/ constip- improved w/ fiber & miralax... he checked celiac dis antibody test (all neg) but she states all symptoms resolved on a wheat-free diet...  ~  Oct10:  Add-on to review PFT's- she works at [employer] and does some inspection that required her to wear a mask ("respirator")... she had employer sponsored PFT's at [facility] 09/21/09 showing FVC= 4.86 (109%), FEV1= 3.79 (107%), %1sec= 78, & mid-flows= 83% predicted... these are normal numbers but the [facility] PA suggested a medical eval (?why)...  pt has no hx resp problems other than some mild allergies, never smoked, etc... she is cleared to wear the respirator & letter written today.  ~  Dec10:  Add-on per GYN>> she was recently seen for routine GYN check & noted left sided back discomfort around to her left shoulder- resolved w/ ASA & no recurrence... GYN told her she needed cardiac eval:  no chest pain, palpit, SOB, etc... she is an aerobic instructor & works out daily w/o any chest discomfort... hx ASD repair 1992 w/ norm coronaries on cath at that time... Risks:  neg Fam Hx- no CAD etc; & neg smoking, HBP, DM, Chol = 0/5 risk factors... baseline EKG shows NSR, WNL>> repeat today NSR, WNL.[name]. we discussed all this in detail.   ~  May 26, 2010:  66mo ROV &  CPX- doing well overall and her CC= onychomycosis on toenails that has been unresponsive to OTC topical Rx... we discussed LAMISIL orally & Rx written today... she denies cardiopulm symptoms, legs are doing well, and no GI complaints...   ~  July 23, 2010:  add-on to discuss anxiety issues> started w/ stress at work, then subconjuctival hemorrhage, & muscle contracton HA... she saw eye doc, then DrFreeman who gave her shots in neck, head for the pain... she noted hx eye prob as child w/ ?retinal hem that resolved & no known recurrence since then... we dioscussed Rx w/ Alprazolam but she can only tol 1/4 tab at a time (1/2 tab makes her sleepy).   Current Problems:   PHYSICAL EXAMINATION (ICD-V70.0) - she is up-to-date on needed screening procedures... GYN= DrMezer every Aug w/ PAP, Mammogram, & she's had one BMD (due to mother's hx)- told normal.  ALLERGY (ICD-995.3) - prev on shots per DrESL, off for some time now and doing satis... takes ZYRTEK 10mg /d Prn...  Hx of ATRIAL SEPTAL DEFECT (ICD-745.5) - S/P ASD repair 1992 by DrGearhardt... doing well without CP, palpit, dizzy, syncope, dyspnea etc... 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  ~  12/10:  **see above** no cardiac risk factors, normal resting EKG...  VARICOSE VEINS, LOWER EXTREMITIES (ICD-454.9) - full eval by DrEarly...  ~  11/08 LE Venous Reflux Exam showed right GSV reflux...  ~  s/p laser surg  12/08 w/ laser ablation right GSV by DrEarly & improved.  IRRITABLE BOWEL SYNDROME (ICD-564.1) - hx severe constipation, redundant colon & hx cecal volvulus... she's been taking MIRALAX & Metamucil w/ control of symptoms... last saw DrPatterson 9/09 & note reviewed... she states all symptoms resolved on Gluten free diet.  ~  normal colonoscopy 5/04 by DrPatterson.   Hx of INTESTINAL VOLVULUS, LARGE BOWEL (ICD-560.2) - cecal volvulus 5/07 on CT in ER, resolved after BE without surgery.  Hx of ENDOMETRIOSIS (ICD-617.9) - s/p hysterectomy for  severe endometriosis problem.  BACK PAIN, LUMBAR (ICD-724.2) - eval by DrGioffre and DrRamos in 2006.  LEG PAIN (ICD-729.5) - eval by DrEarly and DrRendall... MRI w/ "chipped bone" and DrRendall did arthroscopic surg right ankle 2/09... all symptoms resolved w/ yoga, accupuncture, and exercise...  MIGRAINES, HX OF (ICD-V13.8) - prev on RELPAX per DrFreeman- last seen  ~8/09 & doing well...  ANXIETY (ICD-300.00) - on ALPRAZOLAM 0.5mg  Prn >> we discussed taking 1/4 tab by mouth Bid-Tid regularly.   Preventive Screening-Counseling & Management  Alcohol-Tobacco     Alcohol drinks/day: <1     Alcohol type: wine     Smoking Status: quit     Packs/Day: 10 years/1ppd     Year Quit: 1990  Allergies: 1)  ! Codeine 2)  ! Penicillin  Comments:  Nurse/Medical Assistant: The patient's medications and allergies were reviewed with the patient and were updated in the Medication and Allergy Lists.  Past History:  Past Medical History: ALLERGY (ICD-995.3) Hx of ATRIAL SEPTAL DEFECT (ICD-745.5) VARICOSE VEINS, LOWER EXTREMITIES (ICD-454.9) IRRITABLE BOWEL SYNDROME (ICD-564.1) Hx of INTESTINAL VOLVULUS, LARGE BOWEL (ICD-560.2) Hx of ENDOMETRIOSIS (ICD-617.9) BACK PAIN, LUMBAR (ICD-724.2) LEG PAIN (ICD-729.5) MIGRAINES, HX OF (ICD-V13.8) ANXIETY (ICD-300.00)  Past Surgical History: S/P Repair of ASD S/P Hysterectomy for severe endometriosis Right Leg fracture with surgery Varicose laser vein surgery  Family History: Reviewed history from 11/12/2009 and no changes required. Father alive, estranged & medical hx unknown Mother [name] Covert) alive age 60, hx DJD, back pain, osteoporosis 2 Siblings- Sister Ames Coupe has hx AFib; sister Asher Muir is A/W...  No FH of Colon Cancer Lung cancer: Maternal grandmother Family History of Diabetes: Mother Family History of Heart Disease: Maternal Grandfather  Social History: Reviewed history from 05/26/2010 and no changes  required. Married Children Ex-smoker- quit age 64 after 10 yrs of up to 1ppd Social alcohol Avoids caffeine Packs/Day:  10 years/1ppd  Review of Systems      See HPI  The patient denies anorexia, fever, weight loss, weight gain, vision loss, decreased hearing, hoarseness, chest pain, syncope, dyspnea on exertion, peripheral edema, prolonged cough, headaches, hemoptysis, abdominal pain, melena, hematochezia, severe indigestion/heartburn, hematuria, incontinence, muscle weakness, suspicious skin lesions, transient blindness, difficulty walking, depression, abnormal bleeding, enlarged lymph nodes, and angioedema.    Vital Signs:  Patient profile:   49 year old female Height:      71 inches Weight:      177.50 pounds BMI:     24.85 O2 Sat:      99 % on Room air Temp:     98.6 degrees F oral Pulse rate:   68 / minute BP sitting:   112 / 62  (right arm) Cuff size:   regular  Vitals Entered By: Randell Loop CMA (July 23, 2010 9:51 AM)  O2 Sat at Rest %:  99 O2 Flow:  Room air CC: 2 month ROV & add-on for anxiety... Is Patient Diabetic? No Pain Assessment Patient in pain?  no      Comments meds updated today with pt   Physical Exam  Additional Exam:  WD, WN, [name] in NAD... GENERAL:  Alert & oriented; pleasant & cooperative... HEENT:  [place]/AT, EOM-wnl, PERRLA, Fundi-benign, EACs-clear, TMs-wnl, NOSE-clear, THROAT-clear & wnl. NECK:  Supple w/ full ROM; no JVD; normal carotid impulses w/o bruits; no thyromegaly or nodules palpated; no lymphadenopathy. CHEST:  Clear to P & A; without wheezes/ rales/ or rhonchi... s/p ASD repair 1992. HEART:  Regular Rhythm; gr 1/6 SEM without rubs or gallops... ABDOMEN:  Soft & nontender; normal bowel sounds; no organomegaly or masses detected. EXT: without deformities or arthritic changes; +vv laser surg, ven insuffic, no edema... NEURO:  CN's intact; motor testing normal; sensory testing normal; gait normal & balance OK. DERM:  No lesions  noted; no rash etc...    Impression & Recommendations:  Problem # 1:  ANXIETY (ICD-300.00) We had a long discussion about her symptoms... she is reassured. We discussed using the Alprazolam 1/4 tab Bid-Tid regularly... Her updated medication list for this problem includes:    Xanax 0.5 Mg Tabs (Alprazolam) .[name]... Take 1/2 to 1 tablet by mouth three times a day as needed for nerves  Problem # 2:  Hx of ATRIAL SEPTAL DEFECT (ICD-745.5) Stable cardiac...  Problem # 3:  IRRITABLE BOWEL SYNDROME (ICD-564.1) GI has been stable as well...  Problem # 4:  MIGRAINES, HX OF (ICD-V13.8) Hx migraines; these HAs seemed more muscle contraction related & shots from DrFreeman helped some but scared her> we discussed this & she will Rx w/ OTC pain meds...  Complete Medication List: 1)  Zyrtec Allergy 10 Mg Tabs (Cetirizine hcl) .... Take 1 tablet by mouth once a day 2)  Coq10 100 Mg Caps (Coenzyme q10) .... Take 1 tablet by mouth once a day 3)  Fish Oil 1000 Mg Caps (Omega-3 fatty acids) .... Take 1 tablet by mouth once a day 4)  Cvs Evening Primrose Oil 500 Mg Caps (Evening primrose oil) .... Take one capsule by mouth at bedtime 5)  Multivitamins Tabs (Multiple vitamin) .... Take 1 tablet by mouth once a day 6)  B Complex 100 Tabs (B complex vitamins) .[name].. 1 tab daily.Marland KitchenMarland Kitchen 7)  Xanax 0.5 Mg Tabs (Alprazolam) .... Take 1/2 to 1 tablet by mouth three times a day as needed for nerves 8)  Terbinafine Hcl 250 Mg Tabs (Terbinafine hcl) .... Take 1 tab by mouth once daily...  Patient Instructions: 1)  Today we updated your med list- see below.... 2)  We discussed trying the Alprazolam  ~1/4 tab twice daily to help the anxiety & avoid sedation... you may take an extra 1/4 tab as needed too.Marland KitchenMarland Kitchen 3)  Call for any questions.Marland KitchenMarland Kitchen 4)  Please schedule a follow-up appointment in 6 months.

## 2011-01-11 NOTE — Progress Notes (Signed)
Summary: lab orders-  Phone Note Call from Patient Call back at [phone]   Caller: Patient Call For: Daouda Lonzo Reason for Call: Talk to Nurse Summary of Call: need lab orders sent to [facility] on [address] for visit on 05/26/2010 w/ SN. Initial call taken by: Eugene Gavia,  May 17, 2010 11:59 AM  Follow-up for Phone Call        Please advise what labs to order. Thanks.Carron Curie CMA  May 17, 2010 12:09 PM   per SN---lip-[id number]-cbcd-tsh--v70.0/thanks Randell Loop CMA  May 17, 2010 1:42 PM   Additional Follow-up for Phone Call Additional follow up Details #1::        Spoke with pt and advised that we have the orders for her to have labs done.  She states that she will have to call us back with info on where to send the orders Vernie Murders  May 17, 2010 2:14 PM  [facility] to see if pt has contact info for lab. Carron Curie CMA  May 18, 2010 4:03 PM      Additional Follow-up for Phone Call Additional follow up Details #2::    pt returned call. says the contact # is : [phone]. pt # is [phone]. Tivis Ringer, CNA  May 18, 2010 4:29 PM  [employer] at [phone] and got fax #  [phone]. Faxed order for pt labs. Pt aware. Order placed in SN scan folder. Carron Curie CMA  May 18, 2010 4:35 PM

## 2011-01-11 NOTE — Assessment & Plan Note (Signed)
Summary: Acute NP office visit - bilat ear pain   Primary Provider/Referring Provider:  Lorin Picket Nadel,MD  CC:  bilateral ear pain, left worse than right x34months.  went to [facility] last friday and had wax removed and was told that it would improve but it has not, and pain is now going down the side of her neck. - has been treating with OTC therapies.  History of Present Illness: [name] with known hx of Allergic Rhinitis , IBS, hx of ASD s/p repair.     ~  October 09, 2009:  Add-on to review PFT's- she works at [employer] and does some inspection that required her to wear a mask ("respirator")... she had employer sponsored PFT's at [facility] 09/21/09 showing FVC= 4.86 (109%), FEV1= 3.79 (107%), %1sec= 78, & mid-flows= 83% predicted... these are normal numbers but the [facility] PA suggested a medical eval (?why)...  pt has no hx resp problems other than some mild allergies, never smoked, etc... she is cleared to wear the respirator & letter written today.   ~  November 12, 2009:  Add-on per GYN>> she was recently seen for routine GYN check & noted left sided back discomfort around to her left shoulder- resolved w/ ASA & no recurrence... GYN told her she needed cardiac eval:  no chest pain, palpit, SOB, etc... 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 hx ASD repair 1992 w/ norm coronaries on cath at that time... Risks:  neg Fam Hx- no CAD etc; & neg smoking, HBP, DM, Chol = 0/5 risk factors... baseline EKG shows NSR, WNL>> repeat today NSR, WNL.[name]. we discussed all this in detail.  March 11, 2010--Presents for an acute office visit. Complains of bilateral ear pain, left worse than right x72months.  went to [facility] last friday and had wax removed. Has nasal congesiton , drianage and dry cough. NO discolored mucus or fever. Has taken some allegra. Denies chest pain, dyspnea, orthopnea, hemoptysis, fever, n/v/d, edema, headache,recent travel or antibiotics.        Preventive Screening-Counseling & Management  Alcohol-Tobacco     Smoking Status: quit  Medications Prior to Update: 1)  Zyrtec Allergy 10 Mg  Tabs (Cetirizine Hcl) .... Take 1 Tablet By Mouth Once A Day 2)  Coq10 100 Mg  Caps (Coenzyme Q10) .... Take 1 Tablet By Mouth Once A Day 3)  Miralax   Powd (Polyethylene Glycol 3350) .... Take 1 Teaspoon By Mouth As Needed 4)  Metamucil Plus Calcium   Caps (Psyllium-Calcium) .... As Needed 5)  Cvs Evening Primrose Oil 500 Mg  Caps (Evening Primrose Oil) .... Take One Capsule By Mouth At Bedtime 6)  Multivitamins   Tabs (Multiple Vitamin) .... Take 1 Tablet By Mouth Once A Day 7)  B Complex 100   Tabs (B Complex Vitamins) .[name].. 1 Tab Daily.Marland KitchenMarland Kitchen 8)  Relpax 40 Mg Tabs (Eletriptan Hydrobromide) .... One Tablet By Mouth As Needed 9)  Ambien 10 Mg Tabs (Zolpidem Tartrate) .... Take One Tablet By Mouth At Bedtime As Needed 10)  Xanax 0.5 Mg Tabs (Alprazolam) .... Take 1/2 To 1 Tablet By Mouth Three Times A Day As Needed For Nerves  Current Medications (verified): 1)  Zyrtec Allergy 10 Mg  Tabs (Cetirizine Hcl) .... Take 1 Tablet By Mouth Once A Day 2)  Coq10 100 Mg  Caps (Coenzyme Q10) .... Take 1 Tablet By Mouth Once A Day 3)  Cvs Evening Primrose Oil 500 Mg  Caps (  Evening Primrose Oil) .... Take One Capsule By Mouth At Bedtime 4)  Multivitamins   Tabs (Multiple Vitamin) .... Take 1 Tablet By Mouth Once A Day 5)  B Complex 100   Tabs (B Complex Vitamins) .[name].. 1 Tab Daily.Marland KitchenMarland Kitchen 6)  Relpax 40 Mg Tabs (Eletriptan Hydrobromide) .... One Tablet By Mouth As Needed 7)  Xanax 0.5 Mg Tabs (Alprazolam) .... Take 1/2 To 1 Tablet By Mouth Three Times A Day As Needed For Nerves  Allergies (verified): 1)  ! Codeine 2)  ! Penicillin  Past History:  Past Medical History: Last updated: 11/12/2009 ALLERGY (ICD-995.3) Hx of ATRIAL SEPTAL DEFECT (ICD-745.5) VARICOSE VEINS, LOWER EXTREMITIES (ICD-454.9) IRRITABLE BOWEL SYNDROME (ICD-564.1) Hx of INTESTINAL  VOLVULUS, LARGE BOWEL (ICD-560.2) Hx of ENDOMETRIOSIS (ICD-617.9) BACK PAIN, LUMBAR (ICD-724.2) LEG PAIN (ICD-729.5) MIGRAINES, HX OF (ICD-V13.8) ANXIETY (ICD-300.00)  Past Surgical History: Last updated: 11/12/2009 S/P Repair of ASD S/P Hysterectomy for severe endometriosis Right Leg fracture with surgery Varicose laser vein surgery  Family History: Last updated: 11/12/2009 Father alive, estranged & medical hx unknown Mother Engineer, civil (consulting) Covert) alive age 64, hx DJD, back pain, osteoporosis 2 Siblings- Sister [name] has hx AFib; sister [name] is A/W...  No FH of Colon Cancer Lung cancer: Maternal grandmother Family History of Diabetes: Mother Family History of Heart Disease: Maternal Grandfather  Social History: Last updated: 11/12/2009 Married Cildren Ex-smoker- quit age 63 after 10 yrs of up to 1ppd Social alcohol Avoids caffeine  Risk Factors: Alcohol Use: <1 (08/22/2008)  Risk Factors: Smoking Status: quit (03/11/2010)  Social History: Smoking Status:  quit  Review of Systems      See HPI  Vital Signs:  Patient profile:   50 year old female Height:      71 inches Weight:      177.38 pounds BMI:     24.83 O2 Sat:      99 % on Room air Temp:     98.1 degrees F oral Pulse rate:   70 / minute BP sitting:   104 / 62  (left arm) Cuff size:   regular  Vitals Entered By: Candace Master CNA (March 11, 2010 10:20 AM)  O2 Flow:  Room air CC: bilateral ear pain, left worse than right x3months.  went to [facility] last friday and had wax removed and was told that it would improve but it has not, pain is now going down the side of her neck. - has been treating with OTC therapies Is Patient Diabetic? No Comments Medications reviewed with patient Daytime contact number verified with patient. Candace Master CNA  March 11, 2010 10:20 AM    Physical Exam  Additional Exam:  WD, WN, [name] in NAD... GENERAL:  Alert & oriented; pleasant & cooperative... HEENT:   [place]/AT, EOM-wnl, PERRLA, Fundi-benign, EACs-clear, TMs-wnl, NOSE-clear, THROAT-clear & wnl., no lesion noted.  NECK:  Supple w/ full ROM; no JVD; normal carotid impulses w/o bruits; no thyromegaly or nodules palpated; no lymphadenopathy. CHEST:  Clear to P & A; without wheezes/ rales/ or rhonchi... s/p ASD repair 1992. HEART:  Regular Rhythm; gr 1/6 SEM without rubs or gallops... ABDOMEN:  Soft & nontender; normal bowel sounds; no organomegaly or masses detected. EXT: without deformities or arthritic changes; +vv laser surg, ven insuffic, no edema...     Impression & Recommendations:  Problem # 1:  ALLERGIC RHINITIS (ICD-477.9)  Flare. no sign of ear infection  REC:  Take Allegra D once daily for 5 days then as needed for drainage/congestion  Tylneol or Motrin as needed pain/headache Nasonex 2 puffs two times a day until sample is gone.  Saline nasal rinses as needed  Please contact office for sooner follow up if symptoms do not improve or worsen   Her updated medication list for this problem includes:    Zyrtec Allergy 10 Mg Tabs (Cetirizine hcl) .[name]... Take 1 tablet by mouth once a day  Orders: Est. Patient Level III (91478)  Complete Medication List: 1)  Zyrtec Allergy 10 Mg Tabs (Cetirizine hcl) .... Take 1 tablet by mouth once a day 2)  Coq10 100 Mg Caps (Coenzyme q10) .... Take 1 tablet by mouth once a day 3)  Cvs Evening Primrose Oil 500 Mg Caps (Evening primrose oil) .... Take one capsule by mouth at bedtime 4)  Multivitamins Tabs (Multiple vitamin) .... Take 1 tablet by mouth once a day 5)  B Complex 100 Tabs (B complex vitamins) .[name].. 1 tab daily.Marland KitchenMarland Kitchen 6)  Relpax 40 Mg Tabs (Eletriptan hydrobromide) .... One tablet by mouth as needed 7)  Xanax 0.5 Mg Tabs (Alprazolam) .... Take 1/2 to 1 tablet by mouth three times a day as needed for nerves  Patient Instructions: 1)  Take Allegra D once daily for 5 days then as needed for drainage/congestion  2)  Tylneol or Motrin as needed  pain/headache 3)  Nasonex 2 puffs two times a day until sample is gone.  4)  Saline nasal rinses as needed  5)  Please contact office for sooner follow up if symptoms do not improve or worsen

## 2011-01-11 NOTE — Progress Notes (Signed)
Summary: HEADACHE & BUSTED BLOOD VESSEL> defer to dr Neale Burly  Phone Note Call from Patient Call back at [phone]   Caller: Patient Call For: Keely Drennan Reason for Call: Talk to Nurse Summary of Call: BUSTED BLOOD VESSEL IN RIGHT EYE - WENT TO EYE DOCTOR AND THEY SAID IT WAS FROM STRESS.  THIS MORNING PAIN IN HEAD FROM TEMPLE AND TOP OF THE BACK OF NECK - LIKE A TINY TUBE OR THREAD BURNING. PLEASE ADVISE.  TOOK 2 TYLENOL. Initial call taken by: Eugene Gavia,  June 30, 2010 10:00 AM  Follow-up for Phone Call        called and spoke with she stated that she has been under alot of stress at work---noticed yesterday that a blood vessel popped in her right eye and was told by eye doctor that it was stress related.  she has seen Dr. Neale Burly at the [facility] but not for a while.  she also has xanax but they make her sleepy and she has to work.  burning feeling from the right temple down to the top of her neck.. she has taken 2 tylenol with minimal relief,  please advise since SN is out of the office. Randell Loop CMA  June 30, 2010 10:38 AM  Dr Kriste Basque has deferred HA rx to Dr Neale Burly - she should contact him if the meds he rec aren't working and go to urgent care or er in meantime if condition worsens while trying to get DrFreeman Follow-up by: Nyoka Cowden MD,  June 30, 2010 1:13 PM  Additional Follow-up for Phone Call Additional follow up Details #1::        LMOVM to call back.Reynaldo Minium CMA  June 30, 2010 1:24 PM   patient returned call.  advised her of MW's recs.  patient states that she has spoken with Dr. Onnie Boer office who told her to stop the relpax d/t the right eye hemmorage and did not give her any alternatives.  pt does states that her HA is nearly resolved now.  pt also states that she was given drops for the hemmorage in her eye.  pt would like to be seen for eval of her stress and an alternative to the xanax that increases fatigue, though she refuses to see anyone other than SN.   no openings with SN but will speak with his nurse to see if patient may be worked in.  pt okay with a call back tomorrow.  advised pt to call Dr. Onnie Boer office for further recs and to go to UC/ER is symptoms return/worsen.  pt verbalized her understanding.  will forward message to Leigh's inbox for appt. Additional Follow-up by: Boone Master CNA/MA,  June 30, 2010 4:55 PM    Additional Follow-up for Phone Call Additional follow up Details #2::    called and spoke with pt and she is aware of appt on 8-12 with SN---she did see the neurologist this am and given shots to help wth the pain.   Randell Loop [facility]  July 01, 2010 11:44 AM

## 2011-01-12 ENCOUNTER — Telehealth: Payer: Self-pay | Admitting: Gastroenterology

## 2011-01-13 NOTE — Assessment & Plan Note (Signed)
Summary: PAIN LEFT SIDE UNDER RIB CAGE/YF    History of Present Illness Visit Type: Initial Consult Primary GI MD: Sheryn Bison MD FACP FAGA Primary Provider: Alroy Dust, MD  Requesting Provider: Alroy Dust, MD  Chief Complaint: Pt c/o of lower abd pain that radiates to her legs, and loss of appetite History of Present Illness:   50 year old who I previously had seen several years ago because of a very redundant and tortuous colon with possible previous cecal volvulus. She has severe constipation predominant IBS and is taking MiraLax or Metamucil in the past with good response. I really have not seen her in several years. She is on the primary care of Dr.Nadel.  She now presents with 5-6 days of rather sharp subxiphoid discomfort without real precipitating or alleviating elements. She's had no reflux symptoms or dysphagia, nausea and vomiting, change in bowel habits, fever, chills, or specific hepatobiliary complaints. She relates her previous constipation resolved with elimination of gluten from her diet. She now also describes crampy lower abdominal pain without melena or hematochezia, genitourinary or systemic symptomatology.   GI Review of Systems    Reports abdominal pain and  loss of appetite.     Location of  Abdominal pain: lower abdomen.    Denies acid reflux, belching, bloating, chest pain, dysphagia with liquids, dysphagia with solids, heartburn, nausea, vomiting, vomiting blood, weight loss, and  weight gain.        Denies anal fissure, black tarry stools, change in bowel habit, constipation, diarrhea, diverticulosis, fecal incontinence, heme positive stool, hemorrhoids, irritable bowel syndrome, jaundice, light color stool, liver problems, rectal bleeding, and  rectal pain.    Current Medications (verified): 1)  Zyrtec Allergy 10 Mg  Tabs (Cetirizine Hcl) .... Take 1 Tablet By Mouth Once A Day 2)  Coq10 100 Mg  Caps (Coenzyme Q10) .... Take 1 Tablet By Mouth Once A Day 3)   Fish Oil 1000 Mg Caps (Omega-3 Fatty Acids) .... Take 1 Tablet By Mouth Once A Day 4)  Cvs Evening Primrose Oil 500 Mg  Caps (Evening Primrose Oil) .... Take One Capsule By Mouth At Bedtime 5)  Multivitamins   Tabs (Multiple Vitamin) .... Take 1 Tablet By Mouth Once A Day 6)  B Complex 100   Tabs (B Complex Vitamins) .[name].. 1 Tab Daily.Marland KitchenMarland Kitchen 7)  Xanax 0.5 Mg Tabs (Alprazolam) .... Take 1/2 To 1 Tablet By Mouth Three Times A Day As Needed For Nerves 8)  Melatonin 5 Mg Tabs (Melatonin) .... One Tablet By Mouth At Bedtime  Allergies (verified): 1)  ! Codeine 2)  ! Penicillin  Past History:  Past medical, surgical, family and social histories (including risk factors) reviewed for relevance to current acute and chronic problems.  Past Medical History: LOSS OF APPETITE (ICD-783.0) ALLERGIC RHINITIS (ICD-477.9) BACK PAIN (ICD-724.5) DIARRHEA (ICD-787.91) PHYSICAL EXAMINATION (ICD-V70.0) ALLERGY (ICD-995.3) Hx of ATRIAL SEPTAL DEFECT (ICD-745.5) VARICOSE VEINS, LOWER EXTREMITIES (ICD-454.9) IRRITABLE BOWEL SYNDROME (ICD-564.1) Hx of INTESTINAL VOLVULUS, LARGE BOWEL (ICD-560.2) Hx of ENDOMETRIOSIS (ICD-617.9) BACK PAIN, LUMBAR (ICD-724.2) LEG PAIN (ICD-729.5) MIGRAINES, HX OF (ICD-V13.8) ANXIETY (ICD-300.00) ONYCHOMYCOSIS (ICD-110.1)  Past Surgical History: S/P Repair of ASD S/P Hysterectomy for severe endometriosis Right Leg fracture with surgery Varicose laser vein surgery RIght Ankel Surgery   Family History: Reviewed history from 11/12/2009 and no changes required. Father alive, estranged & medical hx unknown Mother [name] Covert) alive age 64, hx DJD, back pain, osteoporosis 2 Siblings- Sister Ames Coupe has hx AFib; sister Asher Muir is A/W... Lung cancer: Maternal  grandmother Family History of Diabetes: Mother Family History of Heart Disease: Maternal Grandfather Family History of Colon Cancer: ? PGF   Social History: Reviewed history from 05/26/2010 and no changes  required. Married Children Ex-smoker- quit age 69 after 10 yrs of up to 1ppd Social alcohol Avoids caffeine  Review of Systems       The patient complains of allergy/sinus, cough, fatigue, and muscle pains/cramps.  The patient denies anemia, anxiety-new, arthritis/joint pain, back pain, blood in urine, breast changes/lumps, change in vision, confusion, coughing up blood, depression-new, fainting, fever, headaches-new, hearing problems, heart murmur, heart rhythm changes, itching, menstrual pain, night sweats, nosebleeds, pregnancy symptoms, shortness of breath, skin rash, sleeping problems, sore throat, swelling of feet/legs, swollen lymph glands, thirst - excessive , urination - excessive , urination changes/pain, urine leakage, vision changes, and voice change.    Vital Signs:  Patient profile:   50 year old female Height:      71 inches Weight:      178 pounds BMI:     24.92 BSA:     2.01 Pulse rate:   72 / minute Pulse rhythm:   regular BP sitting:   126 / 74  (left arm) Cuff size:   regular  Vitals Entered By: Ok Anis CMA (December 02, 2010 11:17 AM)  Physical Exam  General:  Well developed, well nourished, no acute distress.healthy appearing.   Head:  Normocephalic and atraumatic. Eyes:  PERRLA, no icterus.exam deferred to patient's ophthalmologist.   Lungs:  Clear throughout to auscultation. Heart:  Regular rate and rhythm; no murmurs, rubs,  or bruits. Abdomen:  Soft, nontender and nondistended. No masses, hepatosplenomegaly or hernias noted. Normal bowel sounds.There is no distention, masses, tenderness, or abnormal bowel sounds. Most of her discomfort seems to be in the subxiphoid area and epigastric area. There is some scarring in her chest and upper abdomen from previous cardiac septal repair. Msk:  Symmetrical with no gross deformities. Normal posture. Extremities:  No clubbing, cyanosis, edema or deformities noted. Neurologic:  Alert and  oriented x4;  grossly  normal neurologically. Psych:  Alert and cooperative. Normal mood and affect.   Impression & Recommendations:  Problem # 1:  ABDOMINAL PAIN, LEFT UPPER QUADRANT (ICD-789.02) Assessment Unchanged This Patient Does not appear to be acutely ill, and her physical exam is unremarkable. I suspect she has a hiatal hernia with acid reflux disease, also exacerbation of IBS. Clinical presentation does not seem consistent at this point with cecal volvulus. I've schedule upper abdominal ultrasound exam, labs, and I placed her on p.r.n. tramadol 50 mg every 6-8 hours, Librax t.i.d., AcipHex 20 mg twice a day, and will consider endoscopic exam depending on above test and clinical course. Last colonoscopy was in 2004 and otherwise was unremarkable. She probably also will need followup colonoscopy. Patient is status post hysterectomy for severe endometriosis. Orders: TLB-CBC Platelet - w/Differential (85025-CBCD) TLB-BMP (Basic Metabolic Panel-BMET) (80048-METABOL) TLB-Hepatic/Liver Function Pnl (80076-HEPATIC) TLB-TSH (Thyroid Stimulating Hormone) (84443-TSH) TLB-B12, Serum-Total ONLY ([address]-U04) TLB-Ferritin (82728-FER) TLB-Folic Acid (Folate) (82746-FOL) TLB-Iron, (Fe) Total (83540-FE) TLB-IBC Pnl (Iron/FE;Transferrin) (83550-IBC) TLB-Amylase (82150-AMYL) TLB-Lipase (83690-LIPASE) TLB-Sedimentation Rate (ESR) (85652-ESR) Ultrasound Abdomen (UAS)  Problem # 2:  Hx of ATRIAL SEPTAL DEFECT (ICD-745.5) Assessment: Improved  Problem # 3:  Hx of ENDOMETRIOSIS (ICD-617.9) Assessment: Comment Only  Patient Instructions: 1)  Copy sent to : Alroy Dust, MD  2)  You will go to the basement for labs today 3)  Your Ultrasound is scheduled on 12/03/2010 at 8am at [facility],  Nothing to eat or drink after midnight 4)  We are sending in your prescriptions to your pharmacy today 5)  We are giving you Aciphex samples today 6)  The medication list was reviewed and reconciled.  All changed / newly prescribed  medications were explained.  A complete medication list was provided to the patient / caregiver. 7)  Endoscopy may be indicated depending on labs and clinical course. Prescriptions: TRAMADOL HCL 50 MG TABS (TRAMADOL HCL) 1 by mouth every 6-8 hours as needed  #30 x 1   Entered by:   Merri Ray CMA (AAMA)   Authorized by:   Mardella Layman MD [facility]   Signed by:   Merri Ray CMA (AAMA) on 12/02/2010   Method used:   Electronically to        [id number] (retail)       [address]       [place], [place]  [address]       Ph: [phone] or [phone]       Fax: [phone]   RxID:   [id number] CARAFATE 1 GM/10ML SUSP (SUCRALFATE) 1 tbs every 2 hours as needed  #1 bottle x 1   Entered by:   Merri Ray CMA (AAMA)   Authorized by:   Mardella Layman MD [facility]   Signed by:   Merri Ray CMA (AAMA) on 12/02/2010   Method used:   Electronically to        [id number] (retail)       [address]       [place], [place]  [address]       Ph: [phone] or [phone]       Fax: [phone]   RxID:   [id number] LIBRAX 2.5-5 MG CAPS (CLIDINIUM-CHLORDIAZEPOXIDE) 1 by mouth three times a day before meals  #90 x 3   Entered by:   Merri Ray CMA (AAMA)   Authorized by:   Mardella Layman MD [facility]   Signed by:   Merri Ray CMA (AAMA) on 12/02/2010   Method used:   Electronically to        [id number] (retail)       [address]       [place], [place]  [address]       Ph: [phone] or [phone]       Fax: [phone]   RxID:   [id number] ACIPHEX 20 MG TBEC (RABEPRAZOLE SODIUM) 1 by mouth two times a day  #60 x 2   Entered by:   Merri Ray CMA (AAMA)   Authorized by:   Mardella Layman MD [facility]   Signed by:   Merri Ray CMA (AAMA) on 12/02/2010   Method used:   Electronically to        [id number]  (retail)       [address]       [place], [place]  [address]       Ph: [phone] or [phone]       Fax: [phone]   RxID:   [id number]

## 2011-01-13 NOTE — Progress Notes (Signed)
Summary: speak to nurse   Phone Note Call from Patient Call back at [phone]   Caller: Patient Call For: Dr Jarold Motto Reason for Call: Talk to Nurse Summary of Call: Patient states that her GYN told her that the meds given to her might be to strong, wants to discuss with nurse. Initial call taken by: Tawni Levy,  December 16, 2010 3:45 PM  Follow-up for Phone Call        Late entry   12/16/10 @ 1655 for patient to return my call. Patient called Dr Gerre Scull 1 minute earlier and was given an appointment for 12/20/10. Graciella Freer RN  December 17, 2010 10:14 AM   Spoke with patient who stated her GYN thought maybe the Librax was too sedating for patient. Patient stated she takes Librax three times a day. Patient will try to decrease to two times a day -@ breakfast and supper, but will call us back or increase back to three times a day for any IBS symptoms. Follow-up by: Graciella Freer RN,  December 17, 2010 12:08 PM

## 2011-01-13 NOTE — Progress Notes (Signed)
Summary: Ran out of aciphex   Phone Note Call from Patient Call back at [phone]   Call For: Dr Jarold Motto Summary of Call: Her insurance will not pay for aciphex until the 16th and she ran out of medication. Her pharmacy told her they faxed [place] the information so we can get the preauth done for her. They also suggested she take OTC prilosec in the meantime. She looked online and found that maybe  Dexilant might help her. Would like for Dr Jarold Motto to suggest what she should do. Initial call taken by: Leanor Kail [facility],  December 22, 2010 5:04 PM  Follow-up for Phone Call        I advised the patient that I did prior auth for her Aciphex and it is approved and she can come by here an get some samples of Aciphex. I have placed them at the front desk.   She is also stating that she is having lower abdominal pain the same that she had years ago when she had a blockage and needed to have a BE done. She is concerned that she has another blockage. She would like to know if she needs any test before her appt. on 12/30/2010 if not she just wanted Dr. Jarold Motto to know.  Follow-up by: Harlow Mares CMA [name]),  December 23, 2010 8:32 AM  Additional Follow-up for Phone Call Additional follow up Details #1::        repeat BE Additional Follow-up by: Mardella Layman MD FACG,  December 23, 2010 10:32 AM    Additional Follow-up for Phone Call Additional follow up Details #2::    Left a message on patients machine to call back. Harlow Mares CMA (AAMA)  December 23, 2010 10:40 AM advised pt that she needs a BE and I will rx her a refill of carafate. BE schedule 12/27/2010, pt aware.  Dr Kriste Basque advised her to stop Librax i advised her ok to stop and resume after she sees Dr. Clarene Duke. Follow-up by: Harlow Mares CMA [name]),  December 23, 2010 12:25 PM  New/Updated Medications: CARAFATE 1 GM/10ML SUSP (SUCRALFATE) take one gram by mouth with meals and bedtime Prescriptions: CARAFATE 1 GM/10ML SUSP  (SUCRALFATE) take one gram by mouth with meals and bedtime  #1 month x 1   Entered by:   Harlow Mares CMA (AAMA)   Authorized by:   Mardella Layman MD [facility]   Signed by:   Harlow Mares CMA (AAMA) on 12/23/2010   Method used:   Electronically to        [facility]  [address] [id number] (retail)       [address]       [place], [place]  [address]       Ph: [phone] or [phone]       Fax: [phone]   RxID:   [id number]

## 2011-01-13 NOTE — Assessment & Plan Note (Signed)
Summary: fatigue///JJ   Primary Care Provider:  Alroy Dust, MD   CC:  5 month ROV & add-on for fatigue....  History of Present Illness: [name] here for a follow up visit...  she has multiple medical problems including hx ASD repair 1992, VV & VI, IBS improved on gluten free diet, hx LBP, hx migraines, anxiety...   ~  Dec10:  Add-on per GYN>> she was recently seen for routine GYN check & noted left sided back discomfort around to her left shoulder- resolved w/ ASA & no recurrence... GYN told her she needed cardiac eval:  no chest pain, palpit, SOB, etc... she is an aerobic instructor & works out daily w/o any chest discomfort... hx ASD repair 1992 w/ norm coronaries on cath at that time... Risks:  neg Fam Hx- no CAD etc; & neg smoking, HBP, DM, Chol = 0/5 risk factors... baseline EKG shows NSR, WNL>> repeat today NSR, WNL.[name]. we discussed all this in detail.   ~  Jun11:  47mo ROV & CPX- doing well overall and her CC= onychomycosis on toenails that has been unresponsive to OTC topical Rx... we discussed LAMISIL orally & Rx written today... she denies cardiopulm symptoms, legs are doing well, and no GI complaints...  ~  Aug11:  add-on to discuss anxiety issues> started w/ stress at work, then subconjuctival hemorrhage, & muscle contracton HA... she saw eye doc, then DrFreeman who gave her shots in neck, head for the pain... she noted hx eye prob as child w/ ?retinal hem that resolved & no known recurrence since then... we discussed Rx w/ Alprazolam but she can only tol 1/4 tab at a time (1/2 tab makes her sleepy).   ~  December 20, 2010:  Add-on at her request to discuss GYN (DrMezer) wanting to start Estrogen- discussed w/ pt... she also notes 3-4 "episodes" of feeling poorly, low energy, fatigue- "couldn't even  lift head";  rare palpit & advised no caffeine etc but she is concerned & we will refer to Cards for check up (she saw DrLittle in the past ... GI eval 12/11 by DrPatterson- hx redundant  colon, hx volvulus, constip- all improved w/ gluten free diet she says;  Sonar WNL, LABS reported WNL per pt (done by GI at [facility])...   Current Problems:   PHYSICAL EXAMINATION (ICD-V70.0) - she is up-to-date on needed screening procedures... GYN= DrMezer every Aug w/ PAP, Mammogram, & she's had one BMD (due to mother's hx)- told normal.  ~  NOTE: she works at [employer] and does some inspection that required her to wear a mask ("respirator")... she had employer sponsored PFT's at [facility] 09/21/09 showing FVC= 4.86 (109%), FEV1= 3.79 (107%), %1sec= 78, & mid-flows= 83% predicted... these are normal numbers but the [employer] PA suggested a medical eval (?why)>  pt has no hx resp problems other than some mild allergies, never smoked, etc... she is cleared to wear the respirator & letter written.  ALLERGY (ICD-995.3) - prev on shots per DrESL, off for some time now and doing satis... takes ZYRTEK 10mg /d Prn...  Hx of ATRIAL SEPTAL DEFECT (ICD-745.5) - S/P ASD repair 1992 by DrGearhardt... doing well without CP, palpit, dizzy, syncope, dyspnea etc... 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  ~  12/10:  **see above** no cardiac risk factors, normal resting EKG...  ~  1/12:  **see above** pt notes occas palpit & atyp CP> she would like cardiac eval & has seen DrLittle in the past.  VARICOSE VEINS, LOWER EXTREMITIES (ICD-454.9) - full eval by DrEarly...  ~  11/08 LE Venous Reflux Exam showed right GSV reflux...  ~  s/p laser surg 12/08 w/ laser ablation right GSV by DrEarly & improved.  IRRITABLE BOWEL SYNDROME (ICD-564.1) - hx severe constipation, redundant colon & hx cecal volvulus... she's been taking MIRALAX & Metamucil w/ control of symptoms... last saw DrPatterson 9/09 & note reviewed- he checked celiac dis antibody test (all neg)... she states all symptoms resolved on Gluten free diet.  ~  normal colonoscopy 5/04 by DrPatterson.   Hx of INTESTINAL VOLVULUS, LARGE BOWEL (ICD-560.2)  - cecal volvulus 5/07 on CT in ER, resolved after BE without surgery.  Hx of ENDOMETRIOSIS (ICD-617.9) - s/p hysterectomy for severe endometriosis problem.  BACK PAIN, LUMBAR (ICD-724.2) - eval by DrGioffre and DrRamos in 2006.  LEG PAIN (ICD-729.5) - eval by DrEarly and DrRendall... right ankle was fractured while hiking in 2008> they didn't find it for 6 months- finally had right ankle surgery w/ pinning 2/09 by DrRendall (MRI w/ "chipped bone" and DrRendall did arthroscopic surg right ankle 2/09)...  MIGRAINES, HX OF (ICD-V13.8) - prev on RELPAX per DrFreeman- last seen  ~8/09 & doing well...  ANXIETY (ICD-300.00) - on ALPRAZOLAM 0.5mg  Prn >> we discussed taking 1/4 tab by mouth Bid-Tid regularly.   Preventive Screening-Counseling & Management  Alcohol-Tobacco     Alcohol drinks/day: <1     Alcohol type: wine     Smoking Status: quit     Packs/Day: 10 years/1ppd     Year Quit: 1990  Allergies: 1)  ! Codeine 2)  ! Penicillin  Comments:  Nurse/Medical Assistant: The patient's medications and allergies were reviewed with the patient and were updated in the Medication and Allergy Lists.  Past History:  Past Medical History: LOSS OF APPETITE (ICD-783.0) ALLERGIC RHINITIS (ICD-477.9) BACK PAIN (ICD-724.5) DIARRHEA (ICD-787.91) PHYSICAL EXAMINATION (ICD-V70.0) ALLERGY (ICD-995.3) Hx of ATRIAL SEPTAL DEFECT (ICD-745.5) VARICOSE VEINS, LOWER EXTREMITIES (ICD-454.9) IRRITABLE BOWEL SYNDROME (ICD-564.1) Hx of INTESTINAL VOLVULUS, LARGE BOWEL (ICD-560.2) Hx of ENDOMETRIOSIS (ICD-617.9) BACK PAIN, LUMBAR (ICD-724.2) LEG PAIN (ICD-729.5) MIGRAINES, HX OF (ICD-V13.8) ANXIETY (ICD-300.00) ONYCHOMYCOSIS (ICD-110.1)_  Past Surgical History: S/P Repair of ASD S/P Hysterectomy for severe endometriosis Right Leg fracture with surgery Varicose laser vein surgery RIght Ankel Surgery  Family History: Reviewed history from 12/02/2010 and no changes required. Father alive,  estranged & medical hx unknown Mother [name] Covert) alive age 67, hx DJD, back pain, osteoporosis 2 Siblings- Sister Ames Coupe has hx AFib; sister Asher Muir is A/W... Lung cancer: Maternal grandmother Family History of Diabetes: Mother Family History of Heart Disease: Maternal Grandfather Family History of Colon Cancer: ? PGF   Social History: Reviewed history from 05/26/2010 and no changes required. Married Children Ex-smoker- quit age 25 after 10 yrs of up to 1ppd Social alcohol Avoids caffeine  Review of Systems      See HPI  The patient denies anorexia, fever, weight loss, weight gain, vision loss, decreased hearing, hoarseness, chest pain, syncope, dyspnea on exertion, peripheral edema, prolonged cough, headaches, hemoptysis, abdominal pain, melena, hematochezia, severe indigestion/heartburn, hematuria, incontinence, muscle weakness, suspicious skin lesions, transient blindness, difficulty walking, depression, unusual weight change, abnormal bleeding, enlarged lymph nodes, and angioedema.    Vital Signs:  Patient profile:   50 year old female Height:      71 inches Weight:      181.13 pounds BMI:     25.35 O2 Sat:      99 % on Room  air Temp:     98. degrees F oral Pulse rate:   77 / minute BP sitting:   108 / 78  (left arm) Cuff size:   regular  Vitals Entered By: Randell Loop CMA (December 20, 2010 1:54 PM)  O2 Sat at Rest %:  99 O2 Flow:  Room air CC: 5 month ROV & add-on for fatigue... Is Patient Diabetic? No Pain Assessment Patient in pain? yes      Onset of pain  at times with her stomach/cp Comments meds updated today with pt   Physical Exam  Additional Exam:  WD, WN, 49 y/o WF in NAD... GENERAL:  Alert & oriented; pleasant & cooperative... HEENT:  [place]/AT, EOM-wnl, PERRLA, Fundi-benign, EACs-clear, TMs-wnl, NOSE-clear, THROAT-clear & wnl. NECK:  Supple w/ full ROM; no JVD; normal carotid impulses w/o bruits; no thyromegaly or nodules palpated; no  lymphadenopathy. CHEST:  Clear to P & A; without wheezes/ rales/ or rhonchi... s/p ASD repair 1992. HEART:  Regular Rhythm; gr 1/6 SEM without rubs or gallops... ABDOMEN:  Soft & nontender; normal bowel sounds; no organomegaly or masses detected. EXT: without deformities or arthritic changes; +vv laser surg, ven insuffic, no edema... NEURO:  CN's intact; motor testing normal; sensory testing normal; gait normal & balance OK. DERM:  No lesions noted; no rash etc...    Impression & Recommendations:  Problem # 1:  PALPITATIONS (ICD-785.1) She has some atyp CP & palpit & needs reassurance>  she has hx ASD repair... refer to Cards for eval... Orders: Cardiology Referral (Cardiology)  Problem # 2:  FATIGUE (ICD-780.79) ?Etiology>  ?FM, ?other- she says recent blood work at [facility] was all 'WNL (we don't have copies to review)...  Problem # 3:  DIARRHEA (ICD-787.91) GI per DrPatterson>  she reports symptoms alot better on Gluten free diet...  Problem # 4:  MIGRAINES, HX OF (ICD-V13.8) Prev eval by DrFreeman...  Problem # 5:  ANXIETY (ICD-300.00) Advised low dose regular med but she is more inclined to take it just Prn Her updated medication list for this problem includes:    Xanax 0.5 Mg Tabs (Alprazolam) .[name]... Take 1/2 to 1 tablet by mouth three times a day as needed for nerves  Problem # 6:  OTHER MEDICAL PROBLEMS AS NOTED>>>  Complete Medication List: 1)  Zyrtec Allergy 10 Mg Tabs (Cetirizine hcl) .... Take 1 tablet by mouth once a day 2)  Fish Oil 1000 Mg Caps (Omega-3 fatty acids) .... Take 1 tablet by mouth once a day 3)  Coq10 100 Mg Caps (Coenzyme q10) .... Take 1 tablet by mouth once a day 4)  Cvs Evening Primrose Oil 500 Mg Caps (Evening primrose oil) .... Take one capsule by mouth at bedtime 5)  Multivitamins Tabs (Multiple vitamin) .... Take 1 tablet by mouth once a day 6)  B Complex 100 Tabs (B complex vitamins) .[name].. 1 tab daily.Marland KitchenMarland Kitchen 7)  Xanax 0.5 Mg Tabs (Alprazolam) ....  Take 1/2 to 1 tablet by mouth three times a day as needed for nerves 8)  Melatonin 5 Mg Tabs (Melatonin) .... One tablet by mouth at bedtime 9)  Aciphex 20 Mg Tbec (Rabeprazole sodium) .[name].. 1 by mouth two times a day 10)  Librax 2.5-5 Mg Caps (Clidinium-chlordiazepoxide) .[name].. 1 by mouth three times a day before meals  Patient Instructions: 1)  Today we updated your med list- see below.... 2)  Continue your current meds the same for now... 3)  We discussed using the Librax as needed for  abd cramping... 4)  Stay on your laxative regimen regularly to maintain regular movements.Marland KitchenMarland Kitchen 5)  Try the Alprazolam 1/4 tab 2-3 times daily as we discussed... 6)  We will arrange for a cardiac eval of your chest pain & palpitations w/ drLittle as soon as poss...   Immunization History:  Influenza Immunization History:    Influenza:  historical (11/11/2010)

## 2011-01-13 NOTE — Progress Notes (Signed)
Summary: results   Phone Note Call from Patient Call back at [phone]   Caller: Patient Call For: Dr Jarold Motto Reason for Call: Refill Medication, Talk to Nurse Summary of Call: Patient would like BE results from yesterday. Initial call taken by: Tawni Levy,  December 29, 2010 9:06 AM  Follow-up for Phone Call        pt aware scan normal. pt she will keep appt tomorrow Follow-up by: Harlow Mares CMA (AAMA),  December 29, 2010 9:10 AM

## 2011-01-13 NOTE — Progress Notes (Signed)
Summary: req to see sn > 1.9.12  Phone Note Call from Patient   Caller: Patient Call For: nadel Summary of Call: pt wants to see dr Kriste Basque re: fatigue. (didn't want to see tp). wants an appt asap. [phone] Initial call taken by: Tivis Ringer, CNA,  December 16, 2010 3:45 PM  Follow-up for Phone Call        called spoke with patient who c/o fatigue and recent flu-like symptoms.  requests appt to see SN rather than be handled thru phone msg.  again pt declined appt w/ TP.  appt scheduled w/ SN 1.9.12 @ 2pm.  pt ok with this date and time. Boone Master CNA/MA  December 16, 2010 5:10 PM

## 2011-01-13 NOTE — Medication Information (Signed)
Summary: Aciphex approved/[employer]  Aciphex approved/[employer]   Imported By: Sherian Rein 12/30/2010 14:21:40  _____________________________________________________________________  External Attachment:    Type:   Image     Comment:   External Document

## 2011-01-13 NOTE — Progress Notes (Signed)
Summary: Triage   Phone Note Call from Patient Call back at [phone]   Caller: Patient Call For: Dr. Jarold Motto Reason for Call: Talk to Nurse Summary of Call: Pt wants to know what she needs to do next since her results were  normal...thinks she needs a EGD, also medication she is on is making her constipated and needs to know what to do about this Initial call taken by: [place] Johnson,  December 07, 2010 8:36 AM  Follow-up for Phone Call        Spoke with patient to inform her Dr Jarold Motto is off this week. He did mention in his notes that patient may need an EGD and possible Colonoscopy. Patient does report feeling better except for some burning and tightness in her stomach. Patient stated she will continue her meds and try Miralax for constipation. Patient also mentioned she is under a lot of stress. She will call for further problems until we here from Dr Jarold Motto. Dr Jarold Motto, should we schedule EGD/COLON ? Follow-up by: Graciella Freer RN,  December 07, 2010 9:33 AM  Additional Follow-up for Phone Call Additional follow up Details #1::        OV F/U PLEASE Additional Follow-up by: Mardella Layman MD Clementeen Graham,  December 15, 2010 10:06 AM    Additional Follow-up for Phone Call Additional follow up Details #2::    Spoke with patient to inform her Dr Jarold Motto would like to see her to discuss her problems. REV 12/30/10 @ 1130. Patient asked about lab results which were drawn at [facility]. Patient requests we call the 800 number press #8 then #1. Graciella Freer RN  December 15, 2010 11:39 AM   Dr Jarold Motto, f/u is ordered. Patient wanted results of her labs and Ferritin was omitted by [facility]. If pt still wants that result, can we reorder it on 12/30/10? Thanks, Graciella Freer RN  December 16, 2010 8:53 AM   Additional Follow-up for Phone Call Additional follow up Details #3:: Details for Additional Follow-up Action Taken: I will see her in the office before other labs were  ordered. Additional Follow-up by: Mardella Layman MD Clementeen Graham,  December 16, 2010 12:14 PM   Appended Document: Triage Lmom for patient to return my call. Graciella Freer RN 12/16/10  @ 1320  Appended Document: Triage Spoke with patient to inform her her labs were normal except [facility] did not run a Ferritin even though it was ordered. Dr Jarold Motto did not want to reorder it d/t her other labs were good and he didn't think the Ferritin was necessary. Patient stated understanding, she only wanted to know if anemia was a factor in 2 "episodes" she had experienced.

## 2011-01-13 NOTE — Assessment & Plan Note (Addendum)
Summary: f/u to discuss EGD/COL cb    History of Present Illness Visit Type: Follow-up Visit Primary GI MD: Sheryn Bison MD FACP FAGA Primary Provider: Alroy Dust, MD  Requesting Provider: na  Chief Complaint: F/u from barium enema and discuss colon/endo. Pt states that she is having continued weight loss, and denies any GI complaints   History of Present Illness:   [name] is a 50 year old Caucasian female who has had previous hysterectomy and lysis of adhesions related to endometriosis. This was performed by Dr. Barbera Setters in 2004. She has chronic IBS and has a known redundant and tortuous colon with a questionable history of possible sigmoid volvulus in 2007. This was managed by Dr. Lebron Conners. Since that time she has had constipation predominant IBS exacerbated by stress, and has responded to anti-cholinergic medications.  She raises had constant lower abdominal discomfort unrelieved by correction of her constipation. Upper abdominal ultrasound was unremarkable, and barium enema was performed which showed no abnormalities. She denies nausea and vomiting, upper gastrointestinal, or hepatobiliary complaints, but continues with vague lower abdominal burning and discomfort. She denies any specific genitourinary or gynecologic problems, had recent negative urinalysis. Because of these complaints she has lost 15 pounds in weight over the last 2 months. She is followed medically by Dr. Alroy Dust.   GI Review of Systems    Reports weight loss.      Denies abdominal pain, acid reflux, belching, bloating, chest pain, dysphagia with liquids, dysphagia with solids, heartburn, loss of appetite, nausea, vomiting, vomiting blood, and  weight gain.        Denies anal fissure, black tarry stools, change in bowel habit, constipation, diarrhea, diverticulosis, fecal incontinence, heme positive stool, hemorrhoids, irritable bowel syndrome, jaundice, light color stool, liver problems, rectal  bleeding, and  rectal pain.    Current Medications (verified): 1)  Zyrtec Allergy 10 Mg  Tabs (Cetirizine Hcl) .... Take 1 Tablet By Mouth Once A Day 2)  Fish Oil 1000 Mg Caps (Omega-3 Fatty Acids) .... Take 1 Tablet By Mouth Once A Day 3)  Coq10 100 Mg  Caps (Coenzyme Q10) .... Take 1 Tablet By Mouth Once A Day 4)  Cvs Evening Primrose Oil 500 Mg  Caps (Evening Primrose Oil) .... Take One Capsule By Mouth At Bedtime 5)  Multivitamins   Tabs (Multiple Vitamin) .... Take 1 Tablet By Mouth Once A Day 6)  B Complex 100   Tabs (B Complex Vitamins) .[name].. 1 Tab Daily.Marland KitchenMarland Kitchen 7)  Xanax 0.5 Mg Tabs (Alprazolam) .... Take 1/2 To 1 Tablet By Mouth Three Times A Day As Needed For Nerves 8)  Melatonin 5 Mg Tabs (Melatonin) .... One Tablet By Mouth At Bedtime 9)  Omeprazole 20 Mg Cpdr (Omeprazole) .... One Tablet By Mouth Once Daily 10)  Librax 2.5-5 Mg Caps (Clidinium-Chlordiazepoxide) .[name].. 1 By Mouth 2 Times A Day Before Meals 11)  Vitamin C 1000 Mg Tabs (Ascorbic Acid) .... One Tablet By Mouth Once Daily  Allergies (verified): 1)  ! Codeine 2)  ! Penicillin  Past History:  Past medical, surgical, family and social histories (including risk factors) reviewed for relevance to current acute and chronic problems.  Past Medical History: Reviewed history from 12/20/2010 and no changes required. LOSS OF APPETITE (ICD-783.0) ALLERGIC RHINITIS (ICD-477.9) BACK PAIN (ICD-724.5) DIARRHEA (ICD-787.91) PHYSICAL EXAMINATION (ICD-V70.0) ALLERGY (ICD-995.3) Hx of ATRIAL SEPTAL DEFECT (ICD-745.5) VARICOSE VEINS, LOWER EXTREMITIES (ICD-454.9) IRRITABLE BOWEL SYNDROME (ICD-564.1) Hx of INTESTINAL VOLVULUS, LARGE BOWEL (ICD-560.2) Hx of ENDOMETRIOSIS (ICD-617.9) BACK PAIN, LUMBAR (  ICD-724.2) LEG PAIN (ICD-729.5) MIGRAINES, HX OF (ICD-V13.8) ANXIETY (ICD-300.00) ONYCHOMYCOSIS (ICD-110.1)_  Past Surgical History: Reviewed history from 12/20/2010 and no changes required. S/P Repair of ASD S/P Hysterectomy for  severe endometriosis Right Leg fracture with surgery Varicose laser vein surgery RIght Ankel Surgery  Family History: Reviewed history from 12/02/2010 and no changes required. Father alive, estranged & medical hx unknown Mother [name]) alive age 39, hx DJD, back pain, osteoporosis 2 Siblings- Sister [name] has hx AFib; sister [name] is A/W... Lung cancer: Maternal grandmother Family History of Diabetes: Mother Family History of Heart Disease: Maternal Grandfather Family History of Colon Cancer: ? PGF   Social History: Reviewed history from 05/26/2010 and no changes required. Married Children Ex-smoker- quit age 33 after 10 yrs of up to 1ppd Social alcohol Avoids caffeine  Review of Systems       The patient complains of allergy/sinus.  The patient denies anemia, anxiety-new, arthritis/joint pain, back pain, blood in urine, breast changes/lumps, change in vision, confusion, cough, coughing up blood, depression-new, fainting, fatigue, fever, headaches-new, hearing problems, heart murmur, heart rhythm changes, itching, menstrual pain, muscle pains/cramps, night sweats, nosebleeds, pregnancy symptoms, shortness of breath, skin rash, sleeping problems, sore throat, swelling of feet/legs, swollen lymph glands, thirst - excessive, urination - excessive, urination changes/pain, urine leakage, vision changes, and voice change.    Vital Signs:  Patient profile:   50 year old female Height:      71 inches Weight:      172 pounds BMI:     24.08 BSA:     1.98 Pulse rate:   78 / minute Pulse rhythm:   regular BP sitting:   116 / 74  (left arm) Cuff size:   regular  Vitals Entered By: Ok Anis CMA (December 30, 2010 11:32 AM)  Physical Exam  General:  Well developed, well nourished, no acute distress.healthy appearing.   Head:  Normocephalic and atraumatic. Eyes:  PERRLA, no icterus.exam deferred to patient's ophthalmologist.   Abdomen:  Soft, nontender and nondistended.  No masses, hepatosplenomegaly or hernias noted. Normal bowel sounds.Vague tenderness in the lower quadrant areas without any definite mass or rebound tenderness. Bowel sounds are normal. Extremities:  No clubbing, cyanosis, edema or deformities noted. Neurologic:  Alert and  oriented x4;  grossly normal neurologically. Inguinal Nodes:  No significant inguinal adenopathy. Psych:  Alert and cooperative. Normal mood and affect.   Impression & Recommendations:  Problem # 1:  IRRITABLE BOWEL SYNDROME (ICD-564.1) Assessment Unchanged She Has constipation predominant IBS with possible element of chronic abdominal pain related to recurrent pelvic-intestinal adhesions. A repeat her lab test but continue medications as outlined, and scheduled her an appointment to see Dr. Chevis Pretty in gynecology for possible laparoscopy exam. I see no problem with her using Librax several times a day in place of other benzos or narcotics. Orders: TLB-TSH (Thyroid Stimulating Hormone) (84443-TSH) TLB-Sedimentation Rate (ESR) (85652-ESR) TLB-Lipase (83690-LIPASE) TLB-IgA (Immunoglobulin A) (82784-IGA) TLB-IBC Pnl (Iron/FE;Transferrin) (83550-IBC) TLB-Hepatic/Liver Function Pnl (80076-HEPATIC) TLB-Folic Acid (Folate) (82746-FOL) TLB-Ferritin (82728-FER) TLB-CRP-High Sensitivity (C-Reactive Protein) (86140-FCRP) TLB-CBC Platelet - w/Differential (85025-CBCD) T-Sprue Panel (Celiac Disease Aby Eval) (83516x3/[address]) TLB-Amylase (82150-AMYL) TLB-B12, Serum-Total ONLY (16109-U04) TLB-BMP (Basic Metabolic Panel-BMET) (80048-METABOL)  Problem # 2:  LOSS OF APPETITE (ICD-783.0) Assessment: Improved  Problem # 3:  Hx of ENDOMETRIOSIS (ICD-617.9) Assessment: Unchanged  Problem # 4:  ANXIETY (ICD-300.00) Assessment: Improved  Other Orders: Gynecologic Referral (Gyn)  Patient Instructions: 1)  Copy sent to : Alroy Dust, MD  & Teodora Medici, MD  2)  Please go to the basement today for your labs.  3)  Please continue  current medications.  4)  You have an appt scheduled with Dr. Chevis Pretty 01/05/2011 at 2pm, please call his office if you need to reschedule or cancel this appt at [phone]. 5)  The medication list was reviewed and reconciled.  All changed / newly prescribed medications were explained.  A complete medication list was provided to the patient / caregiver.

## 2011-01-13 NOTE — Progress Notes (Signed)
Summary: Lab results   Phone Note Call from Patient Call back at [phone]   Call For: Dr Jarold Motto Reason for Call: Lab or Test Results Initial call taken by: Leanor Kail [facility],  January 04, 2011 10:11 AM  Follow-up for Phone Call        [facility] and they faxed completed lab results to [place]- some remain pending. Informed patient  received results are normal. Patient wanted me to inform Dr Jarold Motto that she has a complex cyst on her r ovary per Dr Carlyn Reichert. She also has adhesions and scar tissue in her abdomen. Patient wants this "fixed surgically" when Dr Jarold Motto complete their assessment. Lance Morin RN  January 04, 2011 3:48 PM  Follow-up by: Graciella Freer RN,  January 04, 2011 3:48 PM  Additional Follow-up for Phone Call Additional follow up Details #1::        noted Additional Follow-up by: Mardella Layman MD Clementeen Graham,  January 04, 2011 4:40 PM

## 2011-01-13 NOTE — Progress Notes (Signed)
Summary: Triage   Phone Note Call from Patient Call back at [phone]   Caller: Patient Call For: Dr. Jarold Motto Reason for Call: Talk to Nurse Summary of Call: Pt got sick to stomach this morning, and also had dark stools this morning Initial call taken by: [place] Johnson,  December 03, 2010 9:00 AM  Follow-up for Phone Call        labsI spoke with the patient this am.  She had an incident of vomiting this am and a dark green stool.  Patient  is advised to continue ordered meds carafate, librax, and aciphex.  I have asked her about the labs she was to have drawn yesterday.  She reports she went to [facility] to have labs drawn.  She is advised to remain on a bland diet and we will call back once Dr Wynonia Hazard her labs and [place].  Follow-up by: Darcey Nora RN, CGRN,  December 03, 2010 10:22 AM  Additional Follow-up for Phone Call Additional follow up Details #1::        ??? labs Additional Follow-up by: Mardella Layman MD FACG,  December 03, 2010 12:05 PM    Additional Follow-up for Phone Call Additional follow up Details #2::    Spoke with patient. She wants to know what the lab results are and ultrasound results. Obtained results of labs from [facility] (all except ferritin). Results are in yellow folder. Pateint aware labs and ultrasound are normal.  Follow-up by: Jesse Fall RN,  December 03, 2010 4:53 PM  Additional Follow-up for Phone Call Additional follow up Details #3:: Details for Additional Follow-up Action Taken: This call was ended d/t patient called in this am and another note was initiated and sent to Dr Jarold Motto when he returns next week. Additional Follow-up by: Graciella Freer RN,  December 07, 2010 1:45 PM   Appended Document: Triage see me if still sick....  Appended Document: Triage done

## 2011-01-18 ENCOUNTER — Ambulatory Visit (INDEPENDENT_AMBULATORY_CARE_PROVIDER_SITE_OTHER): Payer: 59 | Admitting: Gastroenterology

## 2011-01-18 ENCOUNTER — Encounter: Payer: Self-pay | Admitting: Gastroenterology

## 2011-01-18 DIAGNOSIS — K900 Celiac disease: Secondary | ICD-10-CM

## 2011-01-18 DIAGNOSIS — K589 Irritable bowel syndrome without diarrhea: Secondary | ICD-10-CM

## 2011-01-19 NOTE — Progress Notes (Signed)
Summary: results   Phone Note Call from Patient Call back at [phone]   Caller: Patient Call For: Dr. Jarold Motto Reason for Call: Talk to Nurse Summary of Call: calling about blood work results Initial call taken by: [place] Johnson,  January 12, 2011 9:00 AM  Follow-up for Phone Call        Phoned [facility] for remainder of test results. Graciella Freer RN  January 12, 2011 1:46 PM   Informed patient all lab results were received and given to Dr Jarold Motto for review. I will call her when an update is received. Patient stated understanding. Graciella Freer RN  January 12, 2011 2:18 PM   Lmom for patient to call back. Scheduled f/u with Dr Jacquenette Shone for 01/18/11.Graciella Freer RN  January 13, 2011 1:14 PM  Follow-up by: Graciella Freer RN,  January 13, 2011 1:34 PM  Additional Follow-up for Phone Call Additional follow up Details #1::        Spoke with patient and she will keep appointment on 01/18/11. Additional Follow-up by: Graciella Freer RN,  January 13, 2011 1:34 PM

## 2011-01-19 NOTE — Consult Note (Signed)
Summary: Janifer Adie MD/[facility]  Janifer Adie MD/[facility]   Imported By: Lester [place] 01/14/2011 11:31:35  _____________________________________________________________________  External Attachment:    Type:   Image     Comment:   External Document

## 2011-01-19 NOTE — Letter (Signed)
Summary: [facility]  [facility]   Imported By: Lester [place] 01/11/2011 10:49:45  _____________________________________________________________________  External Attachment:    Type:   Image     Comment:   External Document

## 2011-01-25 ENCOUNTER — Telehealth: Payer: Self-pay | Admitting: Gastroenterology

## 2011-01-26 ENCOUNTER — Telehealth (INDEPENDENT_AMBULATORY_CARE_PROVIDER_SITE_OTHER): Payer: Self-pay | Admitting: *Deleted

## 2011-01-26 ENCOUNTER — Telehealth: Payer: Self-pay | Admitting: Pulmonary Disease

## 2011-01-27 NOTE — Assessment & Plan Note (Signed)
Summary: f/u labs abdominal pain/cb   Vital Signs:  Patient profile:   50 year old female Height:      71 inches Weight:      176.50 pounds BMI:     24.71 Pulse rate:   56 / minute Pulse rhythm:   regular BP sitting:   100 / 58  (left arm) Cuff size:   regular  Vitals Entered By: June McMurray CMA [name]) (January 18, 2011 8:40 AM)  History of Present Illness Visit Type: Follow-up Visit Primary GI MD: Sheryn Bison MD FACP FAGA Primary Provider: Alroy Dust, MD  Requesting Provider: na  Chief Complaint: abdominal pain that come & goes since Dec 19, lab results: constipation even with Miralax History of Present Illness:   50 year old Caucasian female with IBS and possible gluten sensitivity. Celiac disease HLA DQ Association is positive for DQ 2 which is associated with a 1-35 risk of celiac disease. Other labs recently drawn were entirely normal including iron levels, B12, folic acid, C. reactive protein, IgA, CBC and liver profile. The patient has had an extensive GI workup recently including barium enema, upper abdominal ultrasound, pelvic ultrasound, and in the past has had negative endoscopy and colonoscopies.  She recently was evaluated by Dr. Chevis Pretty for her chronic abdominal-pelvic pain and was found to have a right ovarian cyst which is felt to be benign in etiology. The patient is convinced that she has" adhesions" and again question surgical intervention. She has rather typical IBS with alternating diarrhea and constipation, currently being constipated on the gluten restricted diet. She uses p.r.n. Librax, Xanax, and is on a variety of vitamin supplements. There is no history of anorexia, weight loss, nausea and vomiting, fever, chills, or other systemic complaints. She is followed closely by Dr. Alroy Dust and by cardiology and gynecology with Dr.Mezer.   GI Review of Systems    Reports bloating, loss of appetite, and  nausea.     Location of  Abdominal pain: lower abdomen.      Denies abdominal pain, acid reflux, belching, chest pain, dysphagia with liquids, dysphagia with solids, heartburn, vomiting, vomiting blood, weight loss, and  weight gain.      Reports constipation.     Denies anal fissure, black tarry stools, change in bowel habit, diarrhea, diverticulosis, fecal incontinence, heme positive stool, hemorrhoids, irritable bowel syndrome, jaundice, light color stool, liver problems, rectal bleeding, and  rectal pain.  Current Medications (verified): 1)  Zyrtec Allergy 10 Mg  Tabs (Cetirizine Hcl) .... Take 1 Tablet By Mouth Once A Day 2)  Fish Oil 1000 Mg Caps (Omega-3 Fatty Acids) .... Take 1 Tablet By Mouth Once A Day 3)  Coq10 100 Mg  Caps (Coenzyme Q10) .... Take 1 Tablet By Mouth Once A Day 4)  Cvs Evening Primrose Oil 500 Mg  Caps (Evening Primrose Oil) .... Take One Capsule By Mouth At Bedtime 5)  Multivitamins   Tabs (Multiple Vitamin) .... Take 1 Tablet By Mouth Once A Day 6)  B Complex 100   Tabs (B Complex Vitamins) .[name].. 1 Tab Daily.Marland KitchenMarland Kitchen 7)  Xanax 0.5 Mg Tabs (Alprazolam) .... Take 1/2 To 1 Tablet By Mouth Three Times A Day As Needed For Nerves 8)  Melatonin 5 Mg Tabs (Melatonin) .... One Tablet By Mouth At Bedtime 9)  Librax 2.5-5 Mg Caps (Clidinium-Chlordiazepoxide) .[name].. 1 By Mouth 2 Times A Day Before Meals 10)  Vitamin C 1000 Mg Tabs (Ascorbic Acid) .... One Tablet By Mouth Once Daily  Allergies (  verified): 1)  ! Codeine 2)  ! Penicillin  Past History:  Past medical, surgical, family and social histories (including risk factors) reviewed for relevance to current acute and chronic problems.  Past Medical History: Reviewed history from 12/20/2010 and no changes required. LOSS OF APPETITE (ICD-783.0) ALLERGIC RHINITIS (ICD-477.9) BACK PAIN (ICD-724.5) DIARRHEA (ICD-787.91) PHYSICAL EXAMINATION (ICD-V70.0) ALLERGY (ICD-995.3) Hx of ATRIAL SEPTAL DEFECT (ICD-745.5) VARICOSE VEINS, LOWER EXTREMITIES (ICD-454.9) IRRITABLE BOWEL SYNDROME  (ICD-564.1) Hx of INTESTINAL VOLVULUS, LARGE BOWEL (ICD-560.2) Hx of ENDOMETRIOSIS (ICD-617.9) BACK PAIN, LUMBAR (ICD-724.2) LEG PAIN (ICD-729.5) MIGRAINES, HX OF (ICD-V13.8) ANXIETY (ICD-300.00) ONYCHOMYCOSIS (ICD-110.1)_  Past Surgical History: Reviewed history from 12/20/2010 and no changes required. S/P Repair of ASD S/P Hysterectomy for severe endometriosis Right Leg fracture with surgery Varicose laser vein surgery RIght Ankel Surgery  Family History: Reviewed history from 12/02/2010 and no changes required. Father alive, estranged & medical hx unknown Mother [name] Covert) alive age 9, hx DJD, back pain, osteoporosis 2 Siblings- Sister Ames Coupe has hx AFib; sister Asher Muir is A/W... Lung cancer: Maternal grandmother Family History of Diabetes: Mother Family History of Heart Disease: Maternal Grandfather Family History of Colon Cancer: ? PGF   Social History: Reviewed history from 05/26/2010 and no changes required. Married Children Ex-smoker- quit age 55 after 10 yrs of up to 1ppd Social alcohol Avoids caffeine  Review of Systems       The patient complains of fatigue, sleeping problems, and urine leakage.  The patient denies allergy/sinus, anemia, anxiety-new, arthritis/joint pain, back pain, blood in urine, breast changes/lumps, change in vision, confusion, cough, coughing up blood, depression-new, fainting, fever, headaches-new, hearing problems, heart murmur, heart rhythm changes, itching, menstrual pain, muscle pains/cramps, night sweats, nosebleeds, pregnancy symptoms, shortness of breath, sore throat, swelling of feet/legs, swollen lymph glands, thirst - excessive , urination - excessive , urination changes/pain, vision changes, and voice change.    Physical Exam  General:  Well developed, well nourished, no acute distress. Head:  Normocephalic and atraumatic. Eyes:  PERRLA, no icterus.exam deferred to patient's ophthalmologist.   Psych:  Alert and  cooperative. Normal mood and affect.   Impression & Recommendations:  Problem # 1:  IRRITABLE BOWEL SYNDROME (ICD-564.1) Assessment Improved I suspect she has gluten" sensitivity" without overt celiac disease. However, she is greatly improved on a gluten restricted diet which I have asked her to continue empirically. As mentioned above, she is positive for DQ 2 allele. She is tapering off of her Librax, and has an  appointment to see Dr. Chevis Pretty in gynecology for followup of her ovarian cyst with her history of endometriosis and previous hysterectomy. Because of her constipation, we will try p.r.n. Amitiza 8 micrograms twice a day as needed. We had a long discussion today concerning her multiple gastrointestinal problems including possible intermittent bowel obstruction. At one point it was suspected that she had an acute cecal volvulus, but this has not recurred, and has not been demonstrated on barium enema exam which was performed recently. I suspect she does have some pelvic-intestinal adhesions exacerbating her chronic IBS. I have urged cautious observation with the above treatment options.  Problem # 2:  CHEST PAIN, ATYPICAL (ICD-786.59) Assessment: Unchanged Cardiac evaluation suggested by Dr. Kriste Basque. She has had previous repair of an atrial septal defect.  Problem # 3:  ABDOMINAL PAIN, LEFT UPPER QUADRANT (ICD-789.02) Assessment: Improved  Problem # 4:  ANXIETY (ICD-300.00) Assessment: Improved  Patient Instructions: 1)  Copy sent to : Alroy Dust, MD and Dr. Teodora Medici in gynecology 2)  Your prescription(s) have been sent to you pharmacy.   3)  Celiac Sprue brochure given.  4)  The medication list was reviewed and reconciled.  All changed / newly prescribed medications were explained.  A complete medication list was provided to the patient / caregiver. 5)  Amitiza 8 micrograms twice a day as needed for constipation. 6)  Please schedule a follow-up appointment as needed.    Prescriptions: AMITIZA 8 MCG CAPS (LUBIPROSTONE) take one by mouth two times a day  #60 x 3   Entered by:   Harlow Mares CMA (AAMA)   Authorized by:   Mardella Layman MD [facility]   Signed by:   Harlow Mares CMA (AAMA) on 01/18/2011   Method used:   Electronically to        [facility]  [address] # [id number]* (retail)       [address]       [place], [place]  [address]       Ph: [phone] or [phone]       Fax: [phone]   RxID:   [id number]

## 2011-02-02 NOTE — Progress Notes (Signed)
Summary: needs a note   Phone Note Call from Patient Call back at Home Phone [phone]   Caller: Patient Call For: Dr Jarold Motto Reason for Call: Talk to Nurse Summary of Call: Patient needs a note from Dr Jarold Motto stating that she needs to see a nutrionist. Initial call taken by: Tawni Levy,  January 25, 2011 3:40 PM  Follow-up for Phone Call        Left a message on patients machine to call back.  Follow-up by: Harlow Mares CMA [name]),  January 25, 2011 3:48 PM  Additional Follow-up for Phone Call Additional follow up Details #1::        Left a message on patients machine to call back.  Additional Follow-up by: Harlow Mares CMA [name]),  January 26, 2011 4:05 PM    Additional Follow-up for Phone Call Additional follow up Details #2::    Left a message on patients machine to call back. multiple attempts if pt needs a referral she will have to call back  Follow-up by: Harlow Mares CMA [name]),  January 27, 2011 8:28 AM

## 2011-02-02 NOTE — Progress Notes (Signed)
  Phone Note Other Incoming   Request: Send information Summary of Call: Patient presented at the window with a completed [place] medical release. Patient was given 15 pages of her records.

## 2011-02-07 ENCOUNTER — Telehealth: Payer: Self-pay | Admitting: Gastroenterology

## 2011-02-08 ENCOUNTER — Encounter (INDEPENDENT_AMBULATORY_CARE_PROVIDER_SITE_OTHER): Payer: Self-pay | Admitting: *Deleted

## 2011-02-08 NOTE — Progress Notes (Signed)
Summary: papers dropped off/ labs  Phone Note Call from Patient   Caller: Patient Call For: Gitty Osterlund Summary of Call: pt dropped off papers (labs) for dr Liviya Santini to look at. call her at [phone]. i have given this to leigh Initial call taken by: Tivis Ringer, CNA,  January 26, 2011 11:08 AM  Follow-up for Phone Call        patient phoned stated that she was still waitting on a call back regarding the paper work that she dropped off. Patient can be reached at [phone].Vedia Coffer  January 28, 2011 8:57 AM  Additional Follow-up for Phone Call Additional follow up Details #1::        pt was called by SN to discuss her labs that she dropped off for review [address] [facility]  February 04, 2011 5:08 PM

## 2011-02-09 ENCOUNTER — Telehealth: Payer: Self-pay | Admitting: Gastroenterology

## 2011-02-09 ENCOUNTER — Encounter: Payer: Self-pay | Admitting: Gastroenterology

## 2011-02-14 ENCOUNTER — Encounter: Payer: 59 | Admitting: Gastroenterology

## 2011-02-14 ENCOUNTER — Other Ambulatory Visit: Payer: 59 | Admitting: Gastroenterology

## 2011-02-14 ENCOUNTER — Telehealth: Payer: Self-pay | Admitting: Gastroenterology

## 2011-02-16 ENCOUNTER — Telehealth: Payer: Self-pay | Admitting: Gastroenterology

## 2011-02-17 NOTE — Miscellaneous (Signed)
Summary: [facility] PREVISIT/PREP  Clinical Lists Changes  Medications: Added new medication of MOVIPREP 100 GM  SOLR (PEG-KCL-NACL-NASULF-NA ASC-C) As per prep instructions. - Signed Rx of MOVIPREP 100 GM  SOLR (PEG-KCL-NACL-NASULF-NA ASC-C) As per prep instructions.;  #1 x 0;  Signed;  Entered by: Wyona Almas RN;  Authorized by: Mardella Layman MD [facility];  Method used: Electronically to [facility] [address] # [id number]*, [address] [place], [place], [place]  [address], Ph: [phone] or [phone], Fax: [phone] Observations: Added new observation of ALLERGY REV: Done (02/09/2011 8:59)    Prescriptions: MOVIPREP 100 GM  SOLR (PEG-KCL-NACL-NASULF-NA ASC-C) As per prep instructions.  #1 x 0   Entered by:   Wyona Almas RN   Authorized by:   Mardella Layman MD [facility]   Signed by:   Wyona Almas RN on 02/09/2011   Method used:   Electronically to        [facility] [address] # [id number]* (retail)       [address]       [place], [place]  [address]       Ph: [phone] or [phone]       Fax: [phone]   RxID:   [id number]

## 2011-02-17 NOTE — Miscellaneous (Signed)
Summary: Orders Update  Clinical Lists Changes  Orders: Added new Test order of T- * Misc. Laboratory test [id number]) - Signed Added new Test order of T- * Misc. Laboratory test [phone]) - Signed   labs ordered per pt request and she will purchase kits and [facility]

## 2011-02-17 NOTE — Progress Notes (Signed)
Summary: speak to nurse   Phone Note Call from Patient Call back at [phone]   Caller: Patient Call For: Dr Jarold Motto Reason for Call: Talk to Nurse, Talk to Doctor Summary of Call: Patient wants to speak to nurse regarding fax that she sent on Friday and a procedure she wants to have done. Initial call taken by: Tawni Levy,  February 07, 2011 10:14 AM  Follow-up for Phone Call        Hulan Saas has labs patient is talking about. Patient wants to know if we can schedule an EGD with biopsy? She has been researching Celiac Disease and feels she needs the biopsy to determine she has the disease.  OK to schedule?Graciella Freer RN  February 07, 2011 11:08 AM   Additional Follow-up for Phone Call Additional follow up Details #1::        DO CELIAC SEROLOGY FIRST Additional Follow-up by: Mardella Layman MD Clementeen Graham,  February 07, 2011 11:38 AM    Additional Follow-up for Phone Call Additional follow up Details #2::    Dr Jarold Motto, patient would like a Colon at the same time if OK. Last Colon 04/23/2003- IN EMR- and she states she has a family hx of Colon Cancer. OK to schedule? Thanks.Graciella Freer RN  February 07, 2011 4:21 PM   Additional Follow-up for Phone Call Additional follow up Details #3:: Details for Additional Follow-up Action Taken: ok..propofol Additional Follow-up by: Mardella Layman MD Clementeen Graham,  February 08, 2011 8:58 AM   Appended Document: speak to nurse Patient scheduled for EGD/COLON on 03/16/11 with Propofol, she will come in am for Pre Visit

## 2011-02-17 NOTE — Letter (Signed)
Summary: [facility] Instructions  [facility]  [address] [place], [place] [address]   Phone: [phone]  Fax: [phone]       [name]    07/04/1961    MRN: [id number]        Procedure Day [name]:  [facility]  03/16/11     Arrival Time:  7:30AM     Procedure Time:  8:30AM     Location of Procedure:                    _ X_  [facility] (4th Floor)                      PREPARATION FOR COLONOSCOPY WITH MOVIPREP   Starting 5 days prior to your procedure 03/11/11 do not eat nuts, seeds, popcorn, corn, beans, peas,  salads, or any raw vegetables.  Do not take any fiber supplements (e.g. Metamucil, Citrucel, and Benefiber).  THE DAY BEFORE YOUR PROCEDURE         DATE: 03/15/11  DAY: TUESDAY  1.  Drink clear liquids the entire day-NO SOLID FOOD  2.  Do not drink anything colored red or purple.  Avoid juices with pulp.  No orange juice.  3.  Drink at least 64 oz. (8 glasses) of fluid/clear liquids during the day to prevent dehydration and help the prep work efficiently.  CLEAR LIQUIDS INCLUDE: Water Jello Ice Popsicles Tea (sugar ok, no milk/cream) Powdered fruit flavored drinks Coffee (sugar ok, no milk/cream) Gatorade Juice: apple, white grape, white cranberry  Lemonade Clear bullion, consomm, broth Carbonated beverages (any kind) Strained chicken noodle soup Hard Candy                             4.  In the morning, mix first dose of MoviPrep solution:    Empty 1 Pouch A and 1 Pouch B into the disposable container    Add lukewarm drinking water to the top line of the container. Mix to dissolve    Refrigerate (mixed solution should be used within 24 hrs)  5.  Begin drinking the prep at 5:00 p.m. The MoviPrep container is divided by 4 marks.   Every 15 minutes drink the solution down to the next mark (approximately 8 oz) until the full liter is complete.   6.  Follow completed prep with 16 oz of clear liquid of your choice (Nothing  red or purple).  Continue to drink clear liquids until bedtime.  7.  Before going to bed, mix second dose of MoviPrep solution:    Empty 1 Pouch A and 1 Pouch B into the disposable container    Add lukewarm drinking water to the top line of the container. Mix to dissolve    Refrigerate  THE DAY OF YOUR PROCEDURE      DATE: 03/16/11  DAY: WEDNESDAY  Beginning at 3:30AM (5 hours before procedure):         1. Every 15 minutes, drink the solution down to the next mark (approx 8 oz) until the full liter is complete.  2. Follow completed prep with 16 oz. of clear liquid of your choice.    3. You may drink clear liquids until 6:30AM (2 HOURS BEFORE PROCEDURE).   MEDICATION INSTRUCTIONS  Unless otherwise instructed, you should take regular prescription medications with a small sip of water   as early as possible the morning of your  procedure.         OTHER INSTRUCTIONS  You will need a responsible adult at least 52 years of age to accompany you and drive you home.   This person must remain in the waiting room during your procedure.  Wear loose fitting clothing that is easily removed.  Leave jewelry and other valuables at home.  However, you may wish to bring a book to read or  an iPod/MP3 player to listen to music as you wait for your procedure to start.  Remove all body piercing jewelry and leave at home.  Total time from sign-in until discharge is approximately 2-3 hours.  You should go home directly after your procedure and rest.  You can resume normal activities the  day after your procedure.  The day of your procedure you should not:   Drive   Make legal decisions   Operate machinery   Drink alcohol   Return to work  You will receive specific instructions about eating, activities and medications before you leave.    The above instructions have been reviewed and explained to me by   Wyona Almas RN  February 09, 2011 9:43 AM     I fully understand and  can verbalize these instructions _____________________________ Date _________

## 2011-02-17 NOTE — Progress Notes (Signed)
Summary: Movi/aspartame   Phone Note Call from Patient   Caller: Patient Details for Reason: Gets Headaches with Aspartame Summary of Call: [name] was here for her previsit and I gave her Movi Prep.  She questioned if you wanted  her to take it because you'd told her to avoid artificial sweetners.  Please advise. Initial call taken by: Wyona Almas RN,  February 09, 2011 10:07 AM  Follow-up for Phone Call        OK FOR THIS Follow-up by: Mardella Layman MD FACG,  February 09, 2011 10:16 AM  Additional Follow-up for Phone Call Additional follow up Details #1::        Pt. notified Additional Follow-up by: Wyona Almas RN,  February 09, 2011 10:34 AM

## 2011-02-18 ENCOUNTER — Encounter: Payer: Self-pay | Admitting: Gastroenterology

## 2011-02-18 ENCOUNTER — Encounter (AMBULATORY_SURGERY_CENTER): Payer: 59 | Admitting: Gastroenterology

## 2011-02-18 ENCOUNTER — Other Ambulatory Visit: Payer: Self-pay | Admitting: Gastroenterology

## 2011-02-18 DIAGNOSIS — K5900 Constipation, unspecified: Secondary | ICD-10-CM

## 2011-02-18 DIAGNOSIS — K219 Gastro-esophageal reflux disease without esophagitis: Secondary | ICD-10-CM

## 2011-02-18 DIAGNOSIS — R1012 Left upper quadrant pain: Secondary | ICD-10-CM

## 2011-02-18 DIAGNOSIS — R630 Anorexia: Secondary | ICD-10-CM

## 2011-02-18 DIAGNOSIS — D126 Benign neoplasm of colon, unspecified: Secondary | ICD-10-CM

## 2011-02-18 DIAGNOSIS — K449 Diaphragmatic hernia without obstruction or gangrene: Secondary | ICD-10-CM

## 2011-02-18 DIAGNOSIS — R197 Diarrhea, unspecified: Secondary | ICD-10-CM

## 2011-02-18 DIAGNOSIS — K209 Esophagitis, unspecified without bleeding: Secondary | ICD-10-CM

## 2011-02-21 ENCOUNTER — Telehealth: Payer: Self-pay | Admitting: Gastroenterology

## 2011-02-22 NOTE — Progress Notes (Signed)
Summary: Questions about Lab order   Phone Note Other Incoming   Caller: Sabrina @ [facility] [phone] Summary of Call: Has questions about Lab orders that was faxed over on Friday Initial call taken by: Karna Christmas,  February 14, 2011 9:13 AM  Follow-up for Phone Call        advised [facility] that we ordered the test per pt request we do not know anything about what type of stool studies they are that her Wellness MD advised her to get them so they would have to contact the patient to get more information.  Follow-up by: Harlow Mares CMA [name]),  February 14, 2011 9:36 AM

## 2011-02-22 NOTE — Procedures (Addendum)
Summary: Colonoscopy  Patient: [name] Note: All result statuses are Final unless otherwise noted.  Tests: (1) Colonoscopy (COL)   COL Colonoscopy           DONE     [facility]     [address]     [place], [place]  [address]          COLONOSCOPY PROCEDURE REPORT          PATIENT:  [name], [name]  MR#:  [id number]     BIRTHDATE:  1961-07-09, 49 yrs. old  GENDER:  female     ENDOSCOPIST:  Vania Rea. Jarold Motto, MD, [facility]     REF. BY:  Alroy Dust, M.D.     PROCEDURE DATE:  02/18/2011     PROCEDURE:  Average-risk screening colonoscopy     G0121     ASA CLASS:  Class II     INDICATIONS:  constipation, Abdominal pain     MEDICATIONS:   propofol (Diprivan) 230 mg IV          DESCRIPTION OF PROCEDURE:   After the risks benefits and     alternatives of the procedure were thoroughly explained, informed     consent was obtained.  Digital rectal exam was performed and     revealed no abnormalities.   The LB 180AL [id number] endoscope was     introduced through the anus and advanced to the cecum, which was     identified by both the appendix and ileocecal valve, limited by a     redundant colon.    The quality of the prep was excellent, using     MoviPrep.  The instrument was then slowly withdrawn as the colon     was fully examined.     <<PROCEDUREIMAGES>>          FINDINGS:  No polyps or cancers were seen.  This was otherwise a     normal examination of the colon.   Retroflexed views in the rectum     revealed no abnormalities.    The scope was then withdrawn from     the patient and the procedure completed.          COMPLICATIONS:  None     ENDOSCOPIC IMPRESSION:     1) No polyps or cancers     2) Otherwise normal examination     CONSTIPATION PREDOMINANT IBS.     RECOMMENDATIONS:     1) Repeat Colonscopy in 10 years.     CONSTIPATION REGIME.     REPEAT EXAM:  No          ______________________________     Vania Rea. Jarold Motto, MD, Clementeen Graham          CC:           n.     eSIGNED:   Vania Rea. Patterson at 02/18/2011 04:15 PM          Gwendalyn Ege, [id number]  Note: An exclamation mark (!) indicates a result that was not dispersed into the flowsheet. Document Creation Date: 02/18/2011 4:16 PM _______________________________________________________________________  (1) Order result status: Final Collection or observation date-time: 02/18/2011 16:07 Requested date-time:  Receipt date-time:  Reported date-time:  Referring Physician:   Ordering Physician: Sheryn Bison [phone]) Specimen Source:  Source: Launa Grill Order Number: [id number] Lab site:   Appended Document: Colonoscopy    Clinical Lists Changes  Observations: Added new observation of COLONNXTDUE: 02/2021 (02/18/2011 16:44)      Appended  Document: Colonoscopy     Procedures Next Due Date:    Colonoscopy: 02/2021

## 2011-02-22 NOTE — Procedures (Addendum)
Summary: Upper Endoscopy  Patient: [name] Note: All result statuses are Final unless otherwise noted.  Tests: (1) Upper Endoscopy (EGD)   EGD Upper Endoscopy       DONE (C)     [facility]     [address]     [place], [place]  [address]          ENDOSCOPY PROCEDURE REPORT          PATIENT:  Candace, Lawson  MR#:  [id number]     BIRTHDATE:  August 16, 1961, 49 yrs. old  GENDER:  female          ENDOSCOPIST:  Vania Rea. Jarold Motto, MD, [facility]     Referred by:          PROCEDURE DATE:  02/18/2011     PROCEDURE:  EGD with biopsy for H. pylori 19147     ASA CLASS:  Class II     INDICATIONS:  abdominal pain despite treatment R/O CELIAC DISEASE.               MEDICATIONS:   propofol (Diprivan) 150 mg IV, There was residual     sedation effect present from prior procedure.     TOPICAL ANESTHETIC:          DESCRIPTION OF PROCEDURE:   After the risks benefits and     alternatives of the procedure were thoroughly explained, informed     consent was obtained.  The LB [id number]-H180 [id number] endoscope was     introduced through the mouth and advanced to the second portion of     the duodenum, without limitations.  The instrument was slowly     withdrawn as the mucosa was fully examined.     <<PROCEDUREIMAGES>>          A hiatal hernia was found. 4 CM HH AND EROSIONS AT GE JUNCTION.CLO     AND DUODENAL BIOPSIES DONE.  The duodenal bulb was normal in     appearance, as was the postbulbar duodenum.  The stomach was     entered and closely examined. The antrum, angularis, and lesser     curvature were well visualized, including a retroflexed view of     the cardia and fundus. The stomach wall was normally distensable.     The scope passed easily through the pylorus into the duodenum.     Esophagitis was found. SEE PICTURES.    Retroflexed views revealed     a hiatal hernia.    The scope was then withdrawn from the patient     and the procedure completed.       COMPLICATIONS:  None          ENDOSCOPIC IMPRESSION:     1) Hiatal hernia     2) Normal duodenum     3) Normal stomach     4) Esophagitis     5) A hiatal hernia     1.GERD     2.R/O CELIAC DISEASE,H.PYLORI VS IBS.     RECOMMENDATIONS:     1) Await pathology results     DEXILANT 60 MG/DAY          REPEAT EXAM:  No          ______________________________     Vania Rea. Jarold Motto, MD, [facility]          CC:  Michele Mcalpine, MD          n.     REVISED:  02/23/2011  04:18 PM     eSIGNED:   Vania Rea. Patterson at 02/23/2011 04:18 PM          Gwendalyn Ege, [id number]  Note: An exclamation mark (!) indicates a result that was not dispersed into the flowsheet. Document Creation Date: 02/23/2011 4:18 PM _______________________________________________________________________  (1) Order result status: Final Collection or observation date-time: 02/18/2011 16:21 Requested date-time:  Receipt date-time:  Reported date-time:  Referring Physician:   Ordering Physician: Sheryn Bison [id number]) Specimen Source:  Source: Launa Grill Order Number: [id number] Lab site:

## 2011-02-22 NOTE — Miscellaneous (Signed)
Summary: RX Dexilant  Clinical Lists Changes  Medications: Added new medication of DEXILANT 60 MG CPDR (DEXLANSOPRAZOLE) Take 1 tab 30 min before breakfast - Signed Rx of DEXILANT 60 MG CPDR (DEXLANSOPRAZOLE) Take 1 tab 30 min before breakfast;  #30 x 6;  Signed;  Entered by: Lowry Ram NCMA;  Authorized by: Mardella Layman MD [facility];  Method used: Electronically to [facility] [address] # [id number]*, [address] [place], [place], [place]  [address], Ph: [phone] or [phone], Fax: [phone]    Prescriptions: DEXILANT 60 MG CPDR (DEXLANSOPRAZOLE) Take 1 tab 30 min before breakfast  #30 x 6   Entered by:   Lowry Ram NCMA   Authorized by:   Mardella Layman MD [facility]   Signed by:   Lowry Ram NCMA on 02/18/2011   Method used:   Electronically to        [facility]  [address] # 11350* (retail)       [address]       [place], [place]  [address]       Ph: [phone] or [phone]       Fax: [phone]   RxID:   [id number]   Appended Document: RX Dexilant Samples given today for Dexilant 60 mg.  lot # [id number] Exp date: 09/2012 Prescription sent to [facility] [address] w/ 6 refills

## 2011-02-22 NOTE — Progress Notes (Signed)
Summary: Triage   Phone Note Call from Patient Call back at [phone]   Caller: Patient Call For: Dr. Jarold Motto Reason for Call: Talk to Nurse Summary of Call: Taking Miralax and still no BM in 4 days, RLQ pain Initial call taken by: Karna Christmas,  February 16, 2011 9:27 AM  Follow-up for Phone Call        Patient stated she's no better than she was in December. Patient continues to have problems with constipation. She is afraid to use Miralax daily- read where she won't go on her own. She gets diarrhea if she takes Miralax and a stool softener daily. Also, the pain in her right side has returned. She saw her GYN and he stated there's no change  in her cyst. Patient has altered her diet for possible Celiac disease and with increased fruit to aid in BM with no luck. She stated the Amitizia did not help and she's not taking it. 1) Do you have any suggestions for the constipation? 2) She is scheduled for ECL on 03/16/11, but your 02/18/11 4PM cancelled- you only have one slot do you want to DOUBLE book to do her procedure on Firday? Thanks. Follow-up by: Graciella Freer RN  February 16, 2011 11:31 AM     Additional Follow-up for Phone Call Additional follow up Details #2::    YES WITH PROPOFOL... Follow-up by: Mardella Layman MD [facility],  February 16, 2011 12:05 PM  Additional Follow-up for Phone Call Additional follow up Details #3:: Details for Additional Follow-up Action Taken: Per Dr Jarold Motto and Dorene Grebe in ENDO, ok to r/s patient for [facility] with Propofol on 02/18/11 at 4pm. Patient will begin her prep tomorrow. Additional Follow-up by: Graciella Freer RN,  February 16, 2011 2:14 PM

## 2011-02-22 NOTE — Miscellaneous (Signed)
Summary: Orders Update  Clinical Lists Changes  Orders: Added new Test order of TLB-H Pylori Screen Gastric Biopsy (83013-CLOTEST) - Signed 

## 2011-02-24 ENCOUNTER — Telehealth: Payer: Self-pay | Admitting: Gastroenterology

## 2011-02-28 ENCOUNTER — Other Ambulatory Visit: Payer: Self-pay | Admitting: Pulmonary Disease

## 2011-02-28 ENCOUNTER — Ambulatory Visit (INDEPENDENT_AMBULATORY_CARE_PROVIDER_SITE_OTHER)
Admission: RE | Admit: 2011-02-28 | Discharge: 2011-02-28 | Disposition: A | Discharge status: Home / Self Care | Payer: 59 | Source: Ambulatory Visit | Attending: Pulmonary Disease | Admitting: Pulmonary Disease

## 2011-02-28 ENCOUNTER — Ambulatory Visit (INDEPENDENT_AMBULATORY_CARE_PROVIDER_SITE_OTHER): Payer: 59 | Admitting: Adult Health

## 2011-02-28 ENCOUNTER — Encounter: Payer: Self-pay | Admitting: Adult Health

## 2011-02-28 ENCOUNTER — Encounter: Payer: Self-pay | Admitting: Gastroenterology

## 2011-02-28 DIAGNOSIS — R0602 Shortness of breath: Secondary | ICD-10-CM

## 2011-02-28 DIAGNOSIS — K219 Gastro-esophageal reflux disease without esophagitis: Secondary | ICD-10-CM | POA: Insufficient documentation

## 2011-03-01 NOTE — Progress Notes (Signed)
Summary: Having trouble breathing   Phone Note Call from Patient Call back at [phone]   Call For: Dr Jarold Motto Summary of Call: Is having trouble breathing - like she cant catch her breath. By the end of the day she is totally eshausted. Initial call taken by: Leanor Kail [facility],  February 24, 2011 10:21 AM  Follow-up for Phone Call        patient complains of SOB when talking and she is crying on the phone. She is very tired and by the end of the day she does not feel like she can even move off the couch. I have advised pt that if she is SOB to the point where she can not function she should go to the ER and if she is feels she can function she should follow up with Dr. Kriste Basque office. she states that she is having anxiety about everything going on and I have explained that her current problems are not related to her Hernia and she had the hernia prior to her EGD and she was not having any of these problems. She verbalized understand and she will call Dr. Kirstie Mirza office.  Follow-up by: Harlow Mares CMA [name]),  February 24, 2011 11:44 AM

## 2011-03-01 NOTE — Medication Information (Signed)
Summary: Amitiza Denial/[employer]  Amitiza Denial/[employer]   Imported By: Lester Forbes 02/23/2011 10:47:20  _____________________________________________________________________  External Attachment:    Type:   Image     Comment:   External Document

## 2011-03-02 ENCOUNTER — Telehealth: Payer: Self-pay | Admitting: Gastroenterology

## 2011-03-03 NOTE — Telephone Encounter (Signed)
Informed pt Dr Jarold Motto stated it's ok to try Nexium. Pt asked about the difference between Dexilant and Nexium and I informed her the Dexilant was longer acting. Pt then decided she will give the Dexilant a little longer to work- she's only be on it 1 week. She then stated she has been seeing a Nutritionist who placed her on various herbs- I cautioned her in that we don't know how some herbs affect prescribed meds. She stated she's on Licorice, Ginger, Vit A with cod liver oil, etc. Pt will for for further problems.

## 2011-03-07 ENCOUNTER — Telehealth: Payer: Self-pay | Admitting: Gastroenterology

## 2011-03-07 DIAGNOSIS — K219 Gastro-esophageal reflux disease without esophagitis: Secondary | ICD-10-CM

## 2011-03-07 DIAGNOSIS — K449 Diaphragmatic hernia without obstruction or gangrene: Secondary | ICD-10-CM

## 2011-03-07 DIAGNOSIS — R110 Nausea: Secondary | ICD-10-CM

## 2011-03-07 NOTE — Telephone Encounter (Signed)
Pt reports nausea that started yesterday that occurs in between meals.  She is also states she has early satiety after a small amount of food. Today she's eaten only 1/2 banana and a container of yogurt. Since she's been told she doesn't have Celiac disease, she ate a bite of a cupcake and the nausea began. Pt is on Dexilant, off Librax. Reports: 1) nausea   2) early fullness    3) feels as though her stomach is in her chest     4) feels like she can't breathe when she talks or walks, but she doesn't feel as though she's not anxious.

## 2011-03-08 NOTE — Telephone Encounter (Signed)
Schedule her for esophageal manometry a 24-hour pH probe testing  off PPI therapy for 3 days.

## 2011-03-09 NOTE — Telephone Encounter (Signed)
Notified pt that Dr Jarold Motto wants her to have an Esophageal Manometry test and 24hr ph probe study. Both scheduled for 04/04/11 @ 0900 at [facility]. She should be off her PPI x 3 days. Booking # [id number] with Katie. Pt stated understanding. Booking #[id number] with Florentina Addison

## 2011-03-10 ENCOUNTER — Encounter: Payer: Self-pay | Admitting: Gastroenterology

## 2011-03-10 ENCOUNTER — Telehealth: Payer: Self-pay | Admitting: Adult Health

## 2011-03-10 NOTE — Letter (Signed)
Summary: Patient [facility] Biopsy Results  [facility]  [address] [place], [place] [address]   Phone: [phone]  Fax: [phone]        February 28, 2011 MRN: [id number]    [name] [address] [place], [place]  [address]    Dear [name],  I am pleased to inform you that the biopsies taken during your recent endoscopic examination did not show any evidence of cancer upon pathologic examination. Biopsies did not show any evidence of celiac disease.  Additional information/recommendations:  __No further action is needed at this time.  Please follow-up with      your primary care physician for your other healthcare needs.  __ Please call [phone] to schedule a return visit to review      your condition.  x__ Continue with the treatment plan as outlined on the day of your      exam.  we will make an appointment with Dr. Luretha Murphy 4 consideration of laparoscopic hiatal hernia repair.  __ You should have a repeat endoscopic examination for this problem              in _ months/years.   Please call us if you are having persistent problems or have questions about your condition that have not been fully answered at this time.  Sincerely,  Mardella Layman MD [facility]  This letter has been electronically signed by your physician.  Appended Document: Patient Notice-Endo Biopsy Results letter mailed

## 2011-03-10 NOTE — Telephone Encounter (Signed)
Called and spoke with pt and she is aware per TP that cxr results were normal.  Pt voiced her understanding

## 2011-03-10 NOTE — Progress Notes (Signed)
Summary: Triage   Phone Note Call from Patient Call back at [phone]   Caller: Patient Call For: Dr. Jarold Motto Reason for Call: Talk to Nurse Summary of Call: Wants to know if it is possible to have corrective surgery for her hernia Initial call taken by: Karna Christmas,  February 21, 2011 8:45 AM  Follow-up for Phone Call        Patient had questions about her [facility] mainly about her activity level. Patient has a one year old grandchild, works in Programme researcher, broadcasting/film/video exercise.  Explained to patient I do not know at what point surgery is done- whether it's the size and/or symptoms, etc. She asks that I ask Dr Jarold Motto when he returns and she has a F/U on 03/22/11. Follow-up by: Graciella Freer RN,  February 21, 2011 12:16 PM  Additional Follow-up for Phone Call Additional follow up Details #1::        REF3ER TO dR. MATT MARTIN FOR FUNDOPLICATION CONSIDERATION... Additional Follow-up by: Mardella Layman MD FACG,  February 28, 2011 11:49 AM    Additional Follow-up for Phone Call Additional follow up Details #2::    [facility] for patient to call back. Patient scheduled to see Dr Daphine Deutscher on 03/24/11, arrive @ 0900. Graciella Freer RN  February 28, 2011 3:26 PM   Notified patient she has been referred to Dr Luretha Murphy at [facility]. Will fax info to them and I mailed directions to patient.  Follow-up by: Graciella Freer RN,  February 28, 2011 4:05 PM

## 2011-03-10 NOTE — Assessment & Plan Note (Signed)
Summary: Acute NP office visit - dyspnea   Copy to:  na Primary Provider/Referring Provider:  Alroy Dust, MD   CC:  increased SOB x1 week at rest and w/ exertion, tightness in chest, wheezing, and occ lightheadedness.  states xanax makes symptoms more tolerable.[name]  History of Present Illness: [name] with known hx of Allergic Rhinitis , IBS, hx of ASD s/p repair.      ~  Dec10:  Add-on per GYN>> she was recently seen for routine GYN check & noted left sided back discomfort around to her left shoulder- resolved w/ ASA & no recurrence... GYN told her she needed cardiac eval:  no chest pain, palpit, SOB, etc... she is an aerobic instructor & works out daily w/o any chest discomfort... hx ASD repair 1992 w/ norm coronaries on cath at that time... Risks:  neg Fam Hx- no CAD etc; & neg smoking, HBP, DM, Chol = 0/5 risk factors... baseline EKG shows NSR, WNL>> repeat today NSR, WNL.[name]. we discussed all this in detail.    February 28, 2011 --Presents for work in visit. She is very distraught over last month. Recently dx with GERd and Hiatal hernia. Feels overwhelmed that she is such a strict diet. She was recently seen by cards for atypical chest pain and dyspnea with neg stress test and echo. Records not available . Also had labs but not availabe. Says she got a good report. She feels very stressed . We discussed in detail GERD dx and prognosis . Moderation in diet and lifestyle modifications. She does use xanax occasionally but uses on 1/4 tablet.  Denies chest pain,  orthopnea, hemoptysis, fever, n/v/d, edema, headache,recent travel or antibiotics., exertional chest pain . discolored mucus.   Medications Prior to Update: 1)  Zyrtec Allergy 10 Mg  Tabs (Cetirizine Hcl) .... Take 1 Tablet By Mouth Once A Day 2)  Fish Oil 1000 Mg Caps (Omega-3 Fatty Acids) .... Take 1 Tablet By Mouth Once A Day 3)  Coq10 100 Mg  Caps (Coenzyme Q10) .... Take 1 Tablet By Mouth Once A Day 4)  Cvs Evening Primrose Oil 500 Mg   Caps (Evening Primrose Oil) .... Take One Capsule By Mouth At Bedtime 5)  Multivitamins   Tabs (Multiple Vitamin) .... Take 1 Tablet By Mouth Once A Day 6)  B Complex 100   Tabs (B Complex Vitamins) .[name].. 1 Tab Daily.Marland KitchenMarland Kitchen 7)  Xanax 0.5 Mg Tabs (Alprazolam) .... Take 1/2 To 1 Tablet By Mouth Three Times A Day As Needed For Nerves 8)  Melatonin 5 Mg Tabs (Melatonin) .... One Tablet By Mouth At Bedtime 9)  Librax 2.5-5 Mg Caps (Clidinium-Chlordiazepoxide) .[name].. 1 By Mouth 2 Times A Day Before Meals 10)  Vitamin C 1000 Mg Tabs (Ascorbic Acid) .... One Tablet By Mouth Once Daily 11)  Amitiza 8 Mcg Caps (Lubiprostone) .... Take One By Mouth Two Times A Day 12)  Moviprep 100 Gm  Solr (Peg-Kcl-Nacl-Nasulf-Na Asc-C) .... As Per Prep Instructions. 13)  Dexilant 60 Mg Cpdr (Dexlansoprazole) .... Take 1 Tab 30 Min Before Breakfast  Current Medications (verified): 1)  Zyrtec Allergy 10 Mg  Tabs (Cetirizine Hcl) .... Take 1 Tablet By Mouth Once A Day 2)  Fish Oil 1000 Mg Caps (Omega-3 Fatty Acids) .... Take 1 Tablet By Mouth Once A Day 3)  Coq10 100 Mg  Caps (Coenzyme Q10) .... Take 1 Tablet By Mouth Once A Day 4)  Multivitamins   Tabs (Multiple Vitamin) .... Take 1 Tablet By Mouth  Once A Day 5)  B Complex 100   Tabs (B Complex Vitamins) .[name].. 1 Tab Daily.Marland KitchenMarland Kitchen 6)  Xanax 0.5 Mg Tabs (Alprazolam) .... Take 1/2 To 1 Tablet By Mouth Three Times A Day As Needed For Nerves 7)  Melatonin 5 Mg Tabs (Melatonin) .... One Tablet By Mouth At Bedtime As Needed 8)  Librax 2.5-5 Mg Caps (Clidinium-Chlordiazepoxide) .[name].. 1 By Mouth 2 Times A Day Before Meals 9)  Vitamin C 1000 Mg Tabs (Ascorbic Acid) .... One Tablet By Mouth Once Daily 10)  Dexilant 60 Mg Cpdr (Dexlansoprazole) .... Take 1 Tab 30 Min Before Breakfast  Allergies (verified): 1)  ! Codeine 2)  ! Penicillin  Past History:  Past Medical History: Last updated: 12/20/2010 LOSS OF APPETITE (ICD-783.0) ALLERGIC RHINITIS (ICD-477.9) BACK PAIN  (ICD-724.5) DIARRHEA (ICD-787.91) PHYSICAL EXAMINATION (ICD-V70.0) ALLERGY (ICD-995.3) Hx of ATRIAL SEPTAL DEFECT (ICD-745.5) VARICOSE VEINS, LOWER EXTREMITIES (ICD-454.9) IRRITABLE BOWEL SYNDROME (ICD-564.1) Hx of INTESTINAL VOLVULUS, LARGE BOWEL (ICD-560.2) Hx of ENDOMETRIOSIS (ICD-617.9) BACK PAIN, LUMBAR (ICD-724.2) LEG PAIN (ICD-729.5) MIGRAINES, HX OF (ICD-V13.8) ANXIETY (ICD-300.00) ONYCHOMYCOSIS (ICD-110.1)_  Past Surgical History: Last updated: 12/20/2010 S/P Repair of ASD S/P Hysterectomy for severe endometriosis Right Leg fracture with surgery Varicose laser vein surgery RIght [facility]kel Surgery  Family History: Last updated: 12/02/2010 Father alive, estranged & medical hx unknown Mother Engineer, civil (consulting) [employer]) alive age 54, hx DJD, back pain, osteoporosis 2 Siblings- Sister Ames Coupe has hx AFib; sister Asher Muir is A/W... Lung cancer: Maternal grandmother Family History of Diabetes: Mother Family History of Heart Disease: Maternal Grandfather Family History of Colon Cancer: ? PGF   Social History: Last updated: 05/26/2010 Married Children Ex-smoker- quit age 55 after 10 yrs of up to 1ppd Social alcohol Avoids caffeine  Risk Factors: Smoking Status: quit (12/20/2010) Packs/Day: 10 years/1ppd (12/20/2010)  Review of Systems      See HPI  Vital Signs:  Patient profile:   50 year old female Height:      71 inches Weight:      169.25 pounds BMI:     23.69 O2 Sat:      98 % on Room air Temp:     98.0 degrees F oral Pulse rate:   65 / minute BP sitting:   94 / 56  (left arm) Cuff size:   regular  Vitals Entered By: Boone Master CNA/MA (February 28, 2011 2:54 PM)  O2 Flow:  Room air CC: increased SOB x1 week at rest and w/ exertion, tightness in chest, wheezing, occ lightheadedness.  states xanax makes symptoms more tolerable. Is Patient Diabetic? No Comments Medications reviewed with patient Daytime contact number verified with patient. Boone Master CNA/MA   February 28, 2011 2:52 PM    Physical Exam  Additional Exam:  WD, WN, [name] in NAD... GENERAL:  Alert & oriented; pleasant & cooperative... HEENT:  [place]/AT, EOM-wnl, PERRLA, Fundi-benign, EACs-clear, TMs-wnl, NOSE-clear, THROAT-clear & wnl., no lesion noted.  NECK:  Supple w/ full ROM; no JVD; normal carotid impulses w/o bruits; no thyromegaly or nodules palpated; no lymphadenopathy. CHEST:  Clear to P & A; without wheezes/ rales/ or rhonchi... s/p ASD repair 1992. HEART:  Regular Rhythm; gr 1/6 SEM without rubs or gallops... ABDOMEN:  Soft & nontender; normal bowel sounds; no organomegaly or masses detected. EXT: without deformities or arthritic changes; +vv laser surg, ven insuffic, no edema...     Impression & Recommendations:  Problem # 1:  DYSPNEA (ICD-786.05)  ?etiology  possible anxiety related.  wil check xray and follow  closely if not improving.   Orders: T-2 View CXR (71020TC) Est. Patient Level IV ([address])  Problem # 2:  GERD (ICD-530.81) gerd diet  meds per gi   Her updated medication list for this problem includes:    Librax 2.5-5 Mg Caps (Clidinium-chlordiazepoxide) .[name]... 1 by mouth 2 times a day before meals    Dexilant 60 Mg Cpdr (Dexlansoprazole) .[name]... Take 1 tab 30 min before breakfast  Orders: Est. Patient Level IV ([address])  Problem # 3:  ANXIETY (ICD-300.00) stress reducers  hold on additonal meds for now.  Her updatsted medication list for this problem includes:    Xanax 0.5 Mg Tabs (Alprazolam) .[name]... Take 1/2 to 1 tablet by mouth three times a day as needed for nerves  Medications Added to Medication List This Visit: 1)  Melatonin 5 Mg Tabs (Melatonin) .... One tablet by mouth at bedtime as needed  Patient Instructions: 1)  Stress reducers  2)  Exercise as tolerated 3)  GERD diet.  4)  Please contact office for sooner follow up if symptoms do not improve or worsen

## 2011-03-15 NOTE — Letter (Signed)
Summary: Appt Reminder 2  [facility]  [address] [place], [place] [address]   Phone: [phone]  Fax: [phone]        March 10, 2011 MRN: [id number]    [name] [address] [place], [place]  [address]    Dear [name],   You have a return appointment with Dr. ______________ on _________ at ________ am/pm.  Please remember to bring a complete list of the medicines you are taking, your insurance card and your co-pay.  If you have to cancel or reschedule this appointment, please call before 5:00 pm the evening before to avoid a cancellation fee.  If you have any questions or concerns, please call [phone].    Sincerely,    Graciella Freer RN

## 2011-03-15 NOTE — Letter (Signed)
Summary: Appt Reminder 2   Gastroenterology  [address] [place], [place] [address]   Phone: [phone]  Fax: [phone]        March 10, 2011 MRN: [id number]    MILICENT ACHEAMPONG [address] [place], [place]  [address]    Dear [name],   You have a return appointment with Dr. Jarold Motto on Apr 26, 2011 at 0900 am. I changed your appointment until after all you tests and reports were complete.  Please remember to bring a complete list of the medicines you are taking, your insurance card and your co-pay.  If you have to cancel or reschedule this appointment, please call before 5:00 pm the evening before to avoid a cancellation fee.  If you have any questions or concerns, please call [phone].    Sincerely,    Graciella Freer RN

## 2011-03-16 ENCOUNTER — Telehealth: Payer: Self-pay | Admitting: Gastroenterology

## 2011-03-16 ENCOUNTER — Encounter: Payer: 59 | Admitting: Gastroenterology

## 2011-03-16 NOTE — Telephone Encounter (Signed)
LMOM for pt that we don't do the testing, but she might try [facility].

## 2011-03-21 ENCOUNTER — Encounter: Payer: 59 | Admitting: Gastroenterology

## 2011-03-22 ENCOUNTER — Ambulatory Visit: Payer: 59 | Admitting: Gastroenterology

## 2011-03-22 ENCOUNTER — Telehealth: Payer: Self-pay | Admitting: Pulmonary Disease

## 2011-03-22 ENCOUNTER — Encounter: Payer: Self-pay | Admitting: Pulmonary Disease

## 2011-03-22 ENCOUNTER — Ambulatory Visit (INDEPENDENT_AMBULATORY_CARE_PROVIDER_SITE_OTHER): Payer: 59 | Admitting: Pulmonary Disease

## 2011-03-22 DIAGNOSIS — R002 Palpitations: Secondary | ICD-10-CM

## 2011-03-22 DIAGNOSIS — Z87898 Personal history of other specified conditions: Secondary | ICD-10-CM

## 2011-03-22 DIAGNOSIS — N809 Endometriosis, unspecified: Secondary | ICD-10-CM

## 2011-03-22 DIAGNOSIS — R5381 Other malaise: Secondary | ICD-10-CM

## 2011-03-22 DIAGNOSIS — F411 Generalized anxiety disorder: Secondary | ICD-10-CM

## 2011-03-22 DIAGNOSIS — B351 Tinea unguium: Secondary | ICD-10-CM

## 2011-03-22 DIAGNOSIS — R0602 Shortness of breath: Secondary | ICD-10-CM

## 2011-03-22 DIAGNOSIS — R5383 Other fatigue: Secondary | ICD-10-CM

## 2011-03-22 DIAGNOSIS — R1012 Left upper quadrant pain: Secondary | ICD-10-CM

## 2011-03-22 DIAGNOSIS — R0789 Other chest pain: Secondary | ICD-10-CM

## 2011-03-22 NOTE — Patient Instructions (Signed)
We updated your med list in EPIC today... Continue your current meds the same... We want to reassure you as much as possible about your medical condition... We will arrange for an appt for you to talk to a specialist about the anxiety you are feeling... Gunnar Fusi in the GI department will discuss the information hand-outs that you received from [facility].Marland KitchenMarland Kitchen

## 2011-03-22 NOTE — Progress Notes (Signed)
Subjective:    Patient ID: [name], female    DOB: Feb 14, 1961, 50 y.o.   MRN: [id number]  HPI [name] here for a follow up visit...  she has multiple medical problems including hx ASD repair 1992, VV & VI, IBS improved on gluten free diet, hx LBP, hx migraines, anxiety...  ~  Aug11:  add-on to discuss anxiety issues> started w/ stress at work, then subconjuctival hemorrhage, & muscle contracton HA... she saw eye doc, then DrFreeman who gave her shots in neck, head for the pain... she noted hx eye prob as child w/ ?retinal hem that resolved & no known recurrence since then... we discussed Rx w/ Alprazolam but she can only tol 1/4 tab at a time (1/2 tab makes her sleepy).  ~  December 20, 2010:  Add-on at her request to discuss GYN (DrMezer) wanting to start Estrogen- discussed w/ pt... she also notes 3-4 "episodes" of feeling poorly, low energy, fatigue- "couldn't even  lift head";  rare palpit & advised no caffeine etc but she is concerned & we will refer to Cards for check up (she saw DrLittle in the past ... GI eval 12/11 by DrPatterson- hx redundant colon, hx volvulus, constip- all improved w/ gluten free diet she says;  Sonar WNL, LABS reported WNL per pt (done by GI at [facility])...  ~  March 22, 2011:  Add-on appt due to her extreme anxiety over her health condition> [name] is anxious, tearful, almost hysterical over her medical problems & restrictions she has placed on herself due to the recent EGD w/ 4cmHH & erosions found- she describes all the things she can't eat & can't do because of the handout she received;  But her anxiety issue are truly generalized & extend to mult organ systems as below;  Unfortunately she is intol to Rebeca Allegra stating that she gets too sedated w/ even 0.125mg  of this med;  We provided reassurance to the pt & husb today, and [name] in GI is going to counsel her about the appropriate GI restrictions w/ her HH...    HAs>  She is followed by DrFreeman w/ prev  trigger point injections for cervicogenic HAs, Migraines, ?FM.Marland KitchenMarland Kitchen    Dyspnea>  Seen by TP w/ dyspnea presentation: exam neg; CXR clear; tried to reassure pt about her resp status...    Cards>  Seen by [facility] 1/12 w/ hx ASD repair yrs ago & stable; they noted "gasping SOB", palpit, CP, etc; they planned Echo & Myoview for completeness...    GI>  Seen by [facility] w/ mult GI complaints & known IBS (constip predom), redundant colon, hx volvulus, etc; she prev noted that everything was better on a gluten free diet but apparently symptoms increased & her chr anxiety lead to full GI work up including BE, EGD, Colonoscopy, etc;  She had a 4cmHH w/ some GE erosions treated w/ PPI (Dexilant) but continued complaints lead to a [facility] referral which is pending;  She is very distraught over her restrictions both dietary & physically- to the point of tears in the office today;  We discussed a milder, reasonable adjustment in lifestyle ( no spicey foods- otherw OK, Ok to pick up grandchild & do her water exercises)...    GYN>  Seen by DrMezer for a complex right ovarian cyst that appears to be benign...    Psyche>  She clearly has a generalized anxiety disorder & in need of psychiatric consultation & counseling...    Past Medical History  Diagnosis Date  . Loss of appetite   . Allergic rhinitis   . Back pain       . History of atrial septal defect   . Varicose veins   . IBS (irritable bowel syndrome)   . Intestinal volvulus   . Endometriosis   . Hx of migraines   . Anxiety   . Onychomycosis     Past Surgical History  Procedure Date  . Repair of asd   . Hysterectomy for severe endometriosis   . Right leg fracture with surgery   . Varicose laser vein surgery   . Right ankle surgery     Outpatient Encounter Prescriptions as of 03/22/2011  Medication Sig Dispense Refill  . ALPRAZolam (XANAX) 0.5 MG tablet Take 1/2 to 1 tablet by mouth three times daily as needed for nerves       . B Complex-Biotin-FA (B  COMPLEX 100 TR PO) Take 1 tablet by mouth daily.        . cetirizine (ZYRTEC) 10 MG tablet Take 10 mg by mouth daily.        . Coenzyme Q10 (COQ10) 100 MG CAPS Take 1 capsule by mouth daily.        [name] dexlansoprazole (DEXILANT) 60 MG capsule Take 60 mg by mouth daily. Take 30 minutes prior to breakfast       . Melatonin 5 MG CAPS Take 1 capsule by mouth at bedtime as needed.        . Multiple Vitamin (MULTIVITAMINS PO) Take 1 tablet by mouth daily.        . Omega-3 Fatty Acids (FISH OIL) 1000 MG CAPS Take 1 capsule by mouth daily.        . clidinium-chlordiazePOXIDE (LIBRAX) 2.5-5 MG per capsule Take 1 capsule by mouth 3 (three) times daily before meals.        . Evening Primrose Oil 500 MG CAPS Take 1 capsule by mouth at bedtime.                Allergies  Allergen Reactions  . Codeine     REACTION: nausea,vomitting and dizziness  . Penicillins     REACTION: hives ---taken as a child    Review of Systems        See HPI - all other systems neg except as noted... The patient notes +HA, +dyspnea, +CP/ palpit, +Abd discomfort, +severe anxiety;  She denies anorexia, fever, weight loss, weight gain, vision loss, decreased hearing, hoarseness, syncope, peripheral edema, prolonged cough, hemoptysis, melena, hematochezia, severe indigestion/heartburn, hematuria, incontinence, muscle weakness, suspicious skin lesions, transient blindness, difficulty walking, unusual weight change, abnormal bleeding, enlarged lymph nodes, and angioedema.     Objective:   Physical Exam     WD, WN, [name] in NAD... GENERAL:  Alert & oriented; pleasant & cooperative... She is distraught & tearful today... HEENT:  [place]/AT, EOM-wnl, PERRLA, Fundi-benign, EACs-clear, TMs-wnl, NOSE-clear, THROAT-clear & wnl. NECK:  Supple w/ fair ROM; no JVD; normal carotid impulses w/o bruits; no thyromegaly or nodules palpated; no lymphadenopathy. CHEST:  Clear to P & A; without wheezes/ rales/ or rhonchi... s/p ASD repair  1992. HEART:  Regular Rhythm; gr 1/6 SEM without rubs or gallops... ABDOMEN:  Soft & nontender; normal bowel sounds; no organomegaly or masses detected. EXT: without deformities or arthritic changes; +vv laser surg, ven insuffic, no edema... NEURO:  CN's intact; motor testing normal; sensory testing normal; gait normal & balance OK. DERM:  No lesions noted; no rash etc... PSYCHE:  She is very distraught & tearful, out of proportion to any real physical ailment...   Assessment & Plan:   Generalized Anxiety Disorder>  Her anxiety & concern are way out of proportion;  We spent 45+min going over her extensive recent medical evaluations & pointed out all the neg findings and good news;  She is sens to the Alprazolam Rx & we discussed Psychiatric consultation for med management & counseling to help her cope better;  We will try to set this up for her...  Dyspnea>  pulm eval neg & CXR clear;  Pt given reassurance...  Cards>  Hx ASD repair; CP & dyspnea eval by [facility] w/ Myoview & 2DEcho...  GI>  Followed by DrPatterson w/ extensive eval as noted;  On Dexilant for erosions, we discussed more approp restrictions & [name] will see pt today for reassurance...  GYN>  Followed by DrMezer for complex right ovarian cyst which he feels is asymtomatic;  Hx endometriosis w/ prev hysterectomy  HAs>  Followed by DrFreeman w/ shots etc...  Other medical problems as noted.Marland KitchenMarland Kitchen

## 2011-03-22 NOTE — Telephone Encounter (Signed)
Pt states she has had increased anxiety and depression. Recent diagnosis of GERD and doesn't feel well in general. Pt crying a lot and this is affecting her daily life. Pt coming in today to see SN at 2:30 pm.

## 2011-03-24 ENCOUNTER — Telehealth: Payer: Self-pay | Admitting: Pulmonary Disease

## 2011-03-24 NOTE — Telephone Encounter (Signed)
lmomtcb x1 to advise pt i did not see anywhere where leigh tried to contact pt. Looked on [employer] as well and did not see anything or papers where she tried to contact pt

## 2011-03-24 NOTE — Telephone Encounter (Signed)
I spoke with [name] and told her she would need to call and set up the appointment herself about the problems discussed.Provided her with information on 2 different providers to select from per Dr. Kriste Basque. Dr. Jennelle Human [phone].Orion Modest at [facility]. [phone]. [phone].Informed her that we were no longer able to set up the appt. For her therefore she needs to call.

## 2011-03-24 NOTE — Telephone Encounter (Signed)
Pt states Leigh tried to call her regarding an appt with psychologist. Pt aware Marliss Czar is out of the office unitl Mon., 03/28/2011 but will forward msg to Sn to see if he has any idea why Leigh had called the patient. Pls advise.

## 2011-03-25 ENCOUNTER — Encounter: Payer: Self-pay | Admitting: Pulmonary Disease

## 2011-03-29 ENCOUNTER — Telehealth: Payer: Self-pay | Admitting: Gastroenterology

## 2011-03-29 ENCOUNTER — Other Ambulatory Visit: Payer: Self-pay | Admitting: Surgery

## 2011-03-29 DIAGNOSIS — K449 Diaphragmatic hernia without obstruction or gangrene: Secondary | ICD-10-CM

## 2011-03-29 NOTE — Telephone Encounter (Signed)
Ok

## 2011-03-29 NOTE — Telephone Encounter (Signed)
Please advise. Thanks.  

## 2011-03-30 NOTE — Telephone Encounter (Signed)
Notified pt that Dr Jarold Motto stated it's ok to cancel the Esophageal Mano. and 24hr probe scheduled for 04/04/11 at 0900. Cancelled appt with Katie at [facility]

## 2011-04-12 ENCOUNTER — Ambulatory Visit
Admission: RE | Admit: 2011-04-12 | Discharge: 2011-04-12 | Disposition: A | Discharge status: Home / Self Care | Payer: 59 | Source: Ambulatory Visit | Attending: Surgery | Admitting: Surgery

## 2011-04-12 DIAGNOSIS — K449 Diaphragmatic hernia without obstruction or gangrene: Secondary | ICD-10-CM

## 2011-04-26 ENCOUNTER — Encounter: Payer: Self-pay | Admitting: Gastroenterology

## 2011-04-26 ENCOUNTER — Ambulatory Visit (INDEPENDENT_AMBULATORY_CARE_PROVIDER_SITE_OTHER): Payer: 59 | Admitting: Gastroenterology

## 2011-04-26 ENCOUNTER — Telehealth: Payer: Self-pay | Admitting: Gastroenterology

## 2011-04-26 VITALS — BP 104/56 | HR 56 | Ht 71.0 in | Wt 170.0 lb

## 2011-04-26 DIAGNOSIS — F32A Depression, unspecified: Secondary | ICD-10-CM

## 2011-04-26 DIAGNOSIS — F419 Anxiety disorder, unspecified: Secondary | ICD-10-CM

## 2011-04-26 DIAGNOSIS — K219 Gastro-esophageal reflux disease without esophagitis: Secondary | ICD-10-CM

## 2011-04-26 DIAGNOSIS — F341 Dysthymic disorder: Secondary | ICD-10-CM

## 2011-04-26 DIAGNOSIS — K589 Irritable bowel syndrome without diarrhea: Secondary | ICD-10-CM

## 2011-04-26 MED ORDER — CITALOPRAM HYDROBROMIDE 10 MG PO TABS: 10.0000 mg | ORAL_TABLET | Freq: Every day | ORAL | Status: DC

## 2011-04-26 NOTE — Assessment & Plan Note (Signed)
OFFICE VISIT   LUELLEN, HOWSON  DOB:  1961/04/27                                       04/18/2008  OZHYQ#:[id number]   The patient presents today for continued followup of her right leg laser  ablation.  She had represented a month ago with concern regarding some  fullness of her right pretibial area.  She had had an ankle fracture and  treatment of this approximately 1 month after laser ablation.  She has  completely resolved any discomfort and is quite pleased with the  results.  She has no further evidence of any varicosities and no  swelling.  She will see [place] again on an as needed basis.   Larina Earthly, M.D.  Electronically Signed   TFE/MEDQ  D:  04/18/2008  T:  04/19/2008  Job:  [id number]

## 2011-04-26 NOTE — Progress Notes (Signed)
History of Present Illness: This is a 50 year old Caucasian female with chronic IBS and acid reflux disease. She is much improved on the Exelon 60 mg a day and strict anti-reflux maneuvers. She has constipation predominant IBS and currently is on daily MiraLax. She continued to have some mild symptoms of anxiety and depression despite Xanax 0.5 mg 3 times a day. She apparently is undergoing menopause and is followed by Dr. Chevis Pretty has recently started estradiol patch. She denies dysphagia, hepatobiliary complaints, melena, hematochezia anorexia or weight loss or systemic complaints. She denies any psychotic symptomatology.    Current Medications, Allergies, Past Medical History, Past Surgical History, Family History and Social History were reviewed in [employer] record.  PE: Awake alert no acute distress. I cannot appreciate stigmata of chronic liver disease. Chest clear cardiac exam is unremarkable. There is no organomegaly, masses or tenderness. Mental status is normal peripheral extremities are unremarkable. There are no gross focal neurological deficits.  Assessment and plan: Acid reflux without significant hiatal hernia demonstrated on upper GI series. However, hiatal hernia was definitely demonstrated the time of endoscopic exam. He'll continue Dexilant 60 mg a day , antireflux maneuvers, and we will add Celexa 10 mg a day to her regime. We reviewed side effects from antidepressants, and she is to call in 2-3 weeks' time for progress report. I decreased her MiraLax to every other day usage. Also advised her to take Caltrate 600 with vitamin D twice a day per menopausal long-term PPI use. We had a long discussion today concerning her symptomatology, and I think she is doing much better than previously assessed. She seems to be very active and very dedicated to improving   her health.     Please copy her primary care physician, referring physician, and pertinent  subspecialists. Encounter Diagnoses  Name Primary?  . GERD (gastroesophageal reflux disease) Yes  . Anxiety and depression   . IBS (irritable bowel syndrome)

## 2011-04-26 NOTE — Assessment & Plan Note (Signed)
[facility]                         GASTROENTEROLOGY OFFICE NOTE   RITAL, CAVEY                     MRN:          [id number]  DATE:10/05/2007                            DOB:          1961/07/08    [name] is a 50 year old white female followed by Dr. Kriste Basque for her  wellness exams.  I have seen her for many years because of a long,  redundant colon associated with complication and 1 episode 2 years ago  of a cecal volvulus.  Last colonoscopy was in May of 2004.   The patient continues with chronic constipation, gas and bloating, and  vague lower abdominal discomfort.  This has all been exacerbated  recently by the dismissal of her husband from a job and rather marked  anxiety.  She, in the past for constipation, has been treated with both  Zelnorm and Amitiza without response.  She takes copious fiber  supplements, says she drinks plenty of liquids, and is experimenting  with MiraLax.  She denies melena, hematochezia, anorexia, weight loss,  or any upper GI or hepatobiliary problems.   MEDICATIONS:  1. Calcium with vitamin D.  2. Multivitamins.  3. Cilium C.  4. Flax seed oil.  5. Something called ColonClenz.  6. Relpax 5 mg a day.  7. P.r.n. Xanax.  8. She apparently uses rather significant doses of ibuprofen.   PAST MEDICAL HISTORY:  She carries a chronic diagnosis of constipation  predominant irritable bowel syndrome.  She was hospitalized in May of  2007 for a cecal volvulus that resolved spontaneously and had a CT scan  and barium enema at that time.  She has had previous repair of an atrial  septal defect and has had a hysterectomy because of uterine fibroids.   EXAMINATION:  She is a healthy-appearing white female in no acute  distress, appearing her stated age.  She weighs 172 pounds.  Her blood pressure is 100/60.  Pulse was 58 and  regular.  I could not appreciate stigmata of chronic liver disease or thyromegaly.  CHEST:  Clear.  She was in a regular rhythm without murmurs, gallops, or  rubs.  I could not appreciate hepatosplenomegaly, abdominal masses, or  significant tenderness.  Bowel sounds are normal.  RECTAL:  Basically unremarkable without an impaction and stool is guaiac  negative.  Peripheral extremities were unremarkable.  Mental status was clear.   ASSESSMENT:  [name] has constipation predominant irritable bowel  syndrome, and this has definitely been exacerbated by recent stress.  She is really not interested in any further evaluations at this point.  I do not think they are indicated at this time.   RECOMMENDATIONS:  1. Continue fiber as tolerated with liberal p.o. fluids.  2. MiraLax 8 ounces on a regular basis at bedtime with p.r.n. Dulcolax      suppository use.  3. Trial of Librax 1 p.o. t.i.d. before meals.  This may paradoxically      help her constipation or perhaps make it worse.  4. Consider sitz marker studies for further clarification of her      constipation, although I  think she has a very elongated redundant      sigmoid colon and has chronic sigmoid colon dysfunction.  5. Consider other medications per Dr. Kriste Basque with GI followup in 3      weeks' time.     Vania Rea. Jarold Motto, MD, Caleen Essex, FAGA  Electronically Signed    DRP/MedQ  DD: 10/05/2007  DT: 10/06/2007  Job #: [id number]   cc:   Lonzo Cloud. Kriste Basque, MD

## 2011-04-26 NOTE — Patient Instructions (Signed)
Start Celexa 10mg  once a day, rx sent to your pharmacy.  Continue all other medication. Call back in a few weeks to give an update on your condition.

## 2011-04-26 NOTE — Assessment & Plan Note (Signed)
OFFICE VISIT   LEXIA, VANDEVENDER  DOB:  12-Oct-1961                                       02/29/2008  EAVWU#:[id number]   The patient presents today for concern regarding discomfort in her  pretibial area and medial calf on the right.  She is status post laser  ablation of right greater saphenous vein on 11/21/2007.  She  subsequently had a fracture and underwent an arthroscopic surgery of her  right ankle on 01/16/2008.  She had sensation of a tingling and burning  sensation on her pretibial and medial calf area.  She also reports some  swelling in the morning when she first arises, and this improves through  the day.  She also reports that she does have some distension of the  veins in her pretibial area with first arising as well.   PHYSICAL EXAM:  She does have a 2+ dorsalis pedis pulse.  She has  incisions on her medial and lateral ankle, which appear to be healing  quite nicely from arthroscopic ankle surgery on 01/16/2008.  She did not  have any venous distension currently or any swelling.  She does have  some typical veins over the dorsum of her foot, which do not appear to  be abnormal or enlarged.  She currently does not have any veins that I  can visualize over her pretibial area.  I imaged her saphenous vein, and  this does reveal complete closure of her vein from her knee proximally.  I have reassured the patient.  I am unclear as to the etiology of her  discomfort.  I doubt that it is related to veins, and discussed this  with her.  I explained to her that her venous hypertension related to  saphenous vein incompetence has been treated.  I explained that we could  inject the veins on the dorsum of her foot, where she has some concern,  and also if they were distended, we could inject them in the pretibial  area, although I do not see that currently.  I am certainly not  convinced that these slight venous distensions are causing any of the  discomfort that she has.  I explained that, the only way to tell would  be to treat these with sclerotherapy and see if she obtains any  improvement.  I explained that, since she is just over 1 month out from  her ankle surgery, that I would certainly wait for at least an  additional month before I would proceed with any treatment to determine  if this resolves spontaneously.  She is reassured with this discussion  and will see [place] again at her convenience to consider further treatment.   Larina Earthly, M.D.  Electronically Signed   TFE/MEDQ  D:  02/29/2008  T:  03/03/2008  Job:  [id number]

## 2011-04-26 NOTE — Assessment & Plan Note (Signed)
OFFICE VISIT   [name], [name]  DOB:  [birth year]                                       11/02/2007  UJWJX#:[id number]   The patient presents today for continued followup of her right leg  venous pathology.  She continues to have a great deal of discomfort with  her right leg despite wearing graduated compression garments.  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  This is very  difficult due to her leg pain.  She reports severe pain in the distal  thigh and proximal calf.  Reports this is a pressure sensation that is  more severe with walking.  She also has pain in driving secondary to  this, and is unable to squat.  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  She also reports that, at the end of  the day, she has severe fatigue in her legs, and is unable to do routine  housework.  She has worn 30-40 mm compression garments thigh-high for  greater than 3 months with no relief of her symptoms.  She elevates her  legs as much as possible and does take ibuprofen 600 mg t.i.d. with no  relief.  She underwent formal duplex evaluation in our office today, and  this reveals incompetence in the saphenous vein from her knee to  proximal thigh.  I had a long discussion with the patient.  I explained  that she clearly has venous hypertension.  I feel that this is most  likely the etiology for her discomfort since her symptoms all appear to  be related to increased venous pressure.  I have recommended that we  proceed with right leg laser ablation of her saphenous vein for  reduction of venous hypertension due to her valvular incompetence.  She  understands and wishes to proceed as an outpatient when insurance  coverage is assured.   Larina Earthly, M.D.  Electronically Signed   TFE/MEDQ  D:  11/02/2007  T:  11/05/2007  Job:  [id number]

## 2011-04-26 NOTE — Assessment & Plan Note (Signed)
OFFICE VISIT   [name], [name]  DOB:  09/05/1961                                       01/04/2008  [id number]#:[id number]   The patient presents today for final followup of her laser ablation of  right greater saphenous vein on 11/21/2007.  She has completely resolved  any erythema that she has had and there has been no further discomfort.  She did have a chip fracture of a bone in her right ankle and is for  orthoscopic treatment of this by Dr. Erasmo Leventhal.  She does have a 2+  dorsalis pedis pulse.  I am pleased with her result, as is the patient,  and she will see [place] again on as needed basis.   Larina Earthly, M.D.  Electronically Signed   TFE/MEDQ  D:  01/04/2008  T:  01/07/2008  Job:  [id number]

## 2011-04-26 NOTE — Assessment & Plan Note (Signed)
OFFICE VISIT   TIARA, MAULTSBY  DOB:  1961-07-06                                       11/23/2007  MVHQI#:[id number]   The patient presents today for followup of her right leg laser ablation  of her saphenous vein and a tributary phlebectomy of a small varix over  the tibial area.  She is now 48 hours post her procedure.  She called  this morning with the sensation of a popping in her right medial thigh.  Removed all of her dressings.  She has a very good initial result with  no bruising and really no discomfort.  She underwent limited venous  duplex showing closure of her saphenous vein and widely patent common  femoral vein.  I have reassured the patient and her husband regarding  this.  I am quite pleased with her initial results.  She will return to  full activities without limitation.  Plan to see her again in 6 weeks  for final followup.  She will notify should she develop any  difficulties.   Larina Earthly, M.D.  Electronically Signed   TFE/MEDQ  D:  11/23/2007  T:  11/26/2007  Job:  [id number]   cc:   Lonzo Cloud. Kriste Basque, MD

## 2011-04-26 NOTE — Procedures (Signed)
LOWER EXTREMITY VENOUS REFLUX EXAM   INDICATION:  Right leg varicosities.   EXAM:  Using color-flow imaging and pulse Doppler spectral analysis, the  right common femoral, superficial femoral, popliteal, posterior tibial,  greater and lesser saphenous veins are evaluated.  There is no evidence  suggesting deep venous insufficiency in the right lower extremity.   The right saphenofemoral junction is competent.  The right GSV is  competent with the caliber as described below.   The right mid-to-distal greater saphenous vein is incompetent (please  see accompanying drawing).   The right proximal short saphenous vein demonstrates competency.   Right saphenofemoral junction 0.64 cm.   GSV Diameter (used if found to be incompetent only)                                            Right    Left  Proximal Greater Saphenous Vein           0.44 cm  cm  Proximal-to-mid-thigh                     0.37 cm  cm  Mid thigh                                 0.39 cm  cm  Mid-distal thigh                          0.37 cm  cm  Distal thigh                              0.40 cm  cm  Knee                                      0.27 cm  cm   IMPRESSION:  1. Right greater saphenous vein reflux is identified with the caliber      ranging from 0.27 cm to 0.67 cm knee to groin.  2. The right greater saphenous vein is not aneurysmal.  3. The right greater saphenous vein is not tortuous.  4. There is no evidence of significant deep venous insufficiency in      the right lower extremity.  5. The deep venous system is competent  6. The right lesser saphenous vein is competent.  7. The right greater saphenous vein is incompetent from the proximal      mid thigh distally at the site of incompetent branch (please see      drawing attached).   ___________________________________________  Larina Earthly, M.D.   DP/MEDQ  D:  11/02/2007  T:  11/03/2007  Job:  [id number]

## 2011-04-26 NOTE — Telephone Encounter (Signed)
Pt forgot to ask Dr Jarold Motto these questions: 1) Is alkaline water ok to take her Dexilant with? 2) Is lemonade alkaline? Informed the pt lemons were acidic but I don't know what happens when sugar is added- still wants you to answer the question. 3) She read WEB MD and it said Celexa could inhibit her sexual drive; she will wait to start this if it's ok?

## 2011-04-26 NOTE — Telephone Encounter (Signed)
Ok x 3  

## 2011-04-26 NOTE — Consult Note (Signed)
NEW PATIENT CONSULTATION   [name], [name]  DOB:  [birth year]                                       09/05/2007  ZOXWR#:[id number]   OFFICE VISIT:  The patient presents today for evaluation of right leg  venous hypertension.  She is an otherwise healthy white female, who  reports right leg discomfort.  This is more pronounced with prolonged  standing and is quite limiting to her at the end of a long day of  standing.  She does not have any history of deep venous thrombosis or  superficial thrombophlebitis.  Her past medical history is significant  for atrial septal defect repaired at age 50.  She does not have any  history of hypertension or diabetes.  She does have premature  atherosclerotic disease in her mother and sister.  She is married.  She  does not smoke, having quit at age 50.  She drinks one to two glasses of  wine per week.  Review of systems is positive for occasional abdominal  pain and constipation, headaches, joint pain, muscle pain, and  nervousness.   ALLERGIES:  MEDICATION ALLERGIES ARE:  1. PENICILLIN.  2. CODEINE.   PHYSICAL EXAMINATION:  GENERAL:  A well-developed, well-nourished white  female, appearing stated age of 50.  VITAL SIGNS:  Blood pressure is 118/62, pulse 68, respirations 16.  EXTREMITIES:  Her radial pulses are 2+ bilaterally.  She has 2+ dorsalis  pedis pulses bilaterally.  She does have some small tributary  varicosities of her pretibial area on the right and at her right medial  calf.  She does not have any varicosities on her left leg.  She  underwent screening venous duplex by me, and this revealed incompetence  in her right greater saphenous vein and no evidence of incompetence in  her left greater saphenous vein.  She had seen an outlying vein clinic  and was not in their network.  She did not have a formal duplex at that  time, but was started on thigh-high graduated compression stockings at  30 mmHg to 40 mmHg on  07/31/2007.  She has also been taking ibuprofen  600 mg for treatment of this and also elevates her legs.  I explained to  her that in all likelihood her feeling of heaviness, especially with  prolonged standing, is related to her venous reflux and venous  hypertension.  She will continue her compression use.  We will see her  again in two months with formal duplex for evaluation of her deep and  superficial system.  We will make further recommendations pending that  examination and also pending her symptom relief.   Larina Earthly, M.D.  Electronically Signed   TFE/MEDQ  D:  09/05/2007  T:  09/06/2007  Job:  [id number]

## 2011-04-27 NOTE — Telephone Encounter (Signed)
Notified pt per Dr Jarold Motto, ok to take Dexilant with alkaline water, lemonade is alkaline, ok to hold Celexa until she feels she should begin taking it. Pt verbalized understanding.

## 2011-04-29 NOTE — Discharge Summary (Signed)
   NAME:  [name], [name]                        ACCOUNT NO.:  [id number]   MEDICAL RECORD NO.:  [id number]                   PATIENT TYPE:  OBV   LOCATION:  0446                                 FACILITY:  [facility]   PHYSICIAN:  Almedia Balls. Fore, M.D.                DATE OF BIRTH:  [birth year]   DATE OF ADMISSION:  01/28/2003  DATE OF DISCHARGE:  01/29/2003                                 DISCHARGE SUMMARY   HISTORY OF PRESENT ILLNESS:  The patient is a 50 year old with rapid  enlargement of uterus with leiomyomata, possibly adenomyosis, history of  endometriosis, history of abnormal uterine bleeding, who was admitted on 17  February for hysterectomy, possible bilateral salpingo-oophorectomy.  The  remainder of his history and physical are as previously dictated.   LABORATORY DATA:  Preoperative hemoglobin 14.5.   Chest x-ray showed chronic obstructive pulmonary disease, mild.  EKG showed  question inferior ischemia versus left ventricular strain, but otherwise  normal.   HOSPITAL COURSE:  The patient was taken to the operating room on 17  February, at which time abdominal supracervical hysterectomy and lysis of  adhesions were performed.  The patient did well postoperatively.  Diet and  ambulation were progressed over the evening of 17 February and early morning  of 18 February.  On the morning of 18 February she was afebrile and  experiencing no problems, and it was felt that she could be discharged at  this time.   FINAL DIAGNOSES:  1. Uterine enlargement.  2. Abnormal uterine bleeding.  3. Pelvic pain.  4. History of endometriosis.   OPERATION:  Abdominal supracervical hysterectomy, lysis of adhesions.  Pathology report unavailable at the time of dictation.   DISPOSITION:  Discharged home to return to the office in two weeks for  follow-up.   ACTIVITY:  She was instructed to gradually progress her activities over  several weeks at home and to limit lifting and driving for  two weeks.  She  was going ambulatory, on a regular diet, and in good condition at the time  of discharge.    MEDICATIONS:  1. She was given a prescription for Dilaudid or generic 2 mg, #30, to be     used one or two q.4h. p.r.n. pain.  2. Doxycycline 100 mg, #12, to be taken one b.i.d.   FOLLOW UP:  She will call for any problems.                                                  Almedia Balls. [name], M.D.    SRF/MEDQ  D:  01/29/2003  T:  01/29/2003  Job:  [id number]

## 2011-04-29 NOTE — H&P (Signed)
[name], [name] NO.:  [id number]   MEDICAL RECORD NO.:  [id number]          PATIENT TYPE:  EMS   LOCATION:  ED                           FACILITY:  [facility]   PHYSICIAN:  Lebron Conners, M.D.   DATE OF BIRTH:  Mar 30, 1961   DATE OF ADMISSION:  05/03/2006  DATE OF DISCHARGE:                                HISTORY & PHYSICAL   CHIEF COMPLAINT:  Abdominal pain.   PRESENT ILLNESS:  The patient is a 50 year old white female who has  irritable bowel syndrome but has no severe problems with constipation or  diarrhea, who has about a 12-hour history of central and lower abdominal  pain, mostly band-like across the lower abdomen.  She had a bowel movement  this morning, has noted some passage of gas, and has had only slight nausea  but no vomiting.  She had fairly significant pain and came to the emergency  department, where she was found have a temperature of 99.9 and a white count  9500 and some tenderness the right lower quadrant.  CT scan was obtained,  which demonstrated some distension of the colon and was read as consistent  with a cecal volvulus per Dr. Molli Posey.  The patient has no history of this  phenomenon.  She was not on any medications for her irritable bowel syndrome  at this time.  She formerly was on Zelnorm.  I was consulted to see her for  this problem.   PAST MEDICAL HISTORY:  The patient has had a repair of an atrial septal  defect and has had a hysterectomy for fibroids.  She has no history of small  intestinal obstruction.  She denies other serious chronic ailments.   MEDICINES:  Tramadol and acetaminophen, Xanax, Allegra occasionally, Optivar  ophthalmic solution.   She has PENICILLIN allergy and intolerance of codeine.   Her family history and childhood illnesses are unremarkable.   A 15-point detailed review of systems is unremarkable.   PHYSICAL EXAMINATION:  VITAL SIGNS:  Temperature recorded as 99.9, blood  pressure 109/68, heart  rate 88, respirations 20.  Pulse oximetry  demonstrated oxygen saturation of 99%.  GENERAL:  The patient was in no acute distress.  She was alert and  appropriate with a normal mental status.  HEENT:  Head, neck, eyes, ears, nose, mouth and throat unremarkable with  moist mucous membranes.  NECK:  No enlargement of thyroid gland.  No thyroid or other neck mass.  No  carotid bruit.  CHEST:  Median sternotomy and mediastinal tube drainage sites.  BREASTS:  Normal.  CHEST:  Clear to auscultation.  CARDIAC:  Rate and rhythm were normal and no murmur or gallop is detected.  ABDOMEN:  Generally soft, minimally distended.  Bowel sounds normoactive.  Very mild right lower quadrant tenderness.  No rebound tenderness detected.  GENITALIA:  Externally normal.  No vaginal exam performed.  RECTAL:  Not performed.  EXTREMITIES:  No edema.  Good pulses.  No lesions noted.  NEUROLOGICAL:  Grossly normal.  LYMPH NODES:  None enlarged in the groin or axillae.   IMPRESSION:  Gastrointestinal upset  of uncertain etiology, possible cecal  volvulus.   PLAN:  I will follow the patient closely in the hospital and obtain a  contrast enema.  I have talked this over with Dr. Molli Posey, the radiologist,  who agrees the study should be done.      Lebron Conners, M.D.  Electronically Signed     WB/MEDQ  D:  05/04/2006  T:  05/04/2006  Job:  [id number]

## 2011-04-29 NOTE — H&P (Signed)
NAME:  [name], [name]                        ACCOUNT NO.:  [id number]   MEDICAL RECORD NO.:  [id number]                   PATIENT TYPE:  AMB   LOCATION:  DAY                                  FACILITY:  [facility]   PHYSICIAN:  Almedia Balls. Fore, M.D.                DATE OF BIRTH:  [birth year]   DATE OF ADMISSION:  01/28/2003  DATE OF DISCHARGE:                                HISTORY & PHYSICAL   CHIEF COMPLAINT:  Heavy bleeding, uterine enlargement.   HISTORY:  The patient is a 50 year old, gravida 2, para 2, whose last  menstrual period was late December 2003.  She has been seen in our office  since August 2003, for the problems as noted above.  She was noted to have  uterus enlarged to approximately [redacted] weeks gestational size and quite  irregular.  She underwent hysteroscopy D&C and laparoscopy in October 2003  with benign findings on the pathology specimens, which were those of  endocervical endometrial polyps.  She has continued to have abnormal  bleeding since that time with pain and findings of endometriosis at the time  of laparoscopy and have led [place] to admit her for definitive therapy at this  time.  She has been on Depot Lupron over the past several months for attempt  to decrease the volume of the uterus to provide for a vaginal approach.  She  has been fully counseled as to the nature of the hysterectomy and possible  bilateral salpingo-oophorectomy.  This discussion has included the procedure  and full risks including anesthesia; injury to bowel, bladder, blood  vessels, ureters, postoperative hemorrhage, infection, recuperation, and  possible hormone replacement should her ovaries be removed.  She fully  understands all of these considerations and wishes to proceed on 29 January 2003.   PAST MEDICAL HISTORY:  1. Repair of probable atrial septal defect in June 1991.  2. She has a history of hypertension for which she has been placed on     Lopressor and takes Xanax for some  occasional anxiety problems.  3. She is ALLERGIC to PENICILLIN, CODEINE.  4. She has a history of also of anemia.   FAMILY HISTORY:  Paternal grandmother with cancer of the lung, grandfather  with cardiovascular disease.   REVIEW OF SYSTEMS:  HEENT:  Negative.  CARDIORESPIRATORY:  As noted above.  GASTROINTESTINAL:  Negative.  GENITOURINARY:  As noted above.  NEUROMUSCULAR:  Negative.   PHYSICAL EXAMINATION:  VITAL SIGNS:  Height 5 feet 11-1/4 inch, weight 172  pounds, blood pressure 119/74, pulse 80, respirations 18.  GENERAL:  Well-developed, white female, in no acute distress.  HEENT:  Within normal limits.  NECK:  Supple without masses, adenopathy, or bruits.  HEART:  Regular rate and rhythm without murmurs.  LUNGS:  Clear to P&A.  BREASTS:  Examined sitting and lying without mass.  Axilla negative.  ABDOMEN:  Soft with slight lower  abdominal mass palpable which is nontender.  PELVIC:  External genitalia, Bartholin urethral Skene's glands within normal  limits.  Cervix slightly inflamed.  Uterus is mid position and approximately  10-[redacted] weeks gestational size and somewhat tender.  There are no palpable  adnexal masses.  Anterior and posterior cul-de-sac exam is confirmatory.  EXTREMITIES:  Within normal limits.  CENTRAL NERVOUS SYSTEM:  Grossly intact.  SKIN:  Without suspicious lesions.   IMPRESSION:  1. Uterine enlargement.  Probable leiomyomata.  2. Abnormal uterine bleeding.  3. History of anemia.  4. History of endometriosis.   DISPOSITION:  As noted above.                                               Almedia Balls. [name], M.D.    SRF/MEDQ  D:  01/23/2003  T:  01/23/2003  Job:  [id number]

## 2011-04-29 NOTE — Op Note (Signed)
NAME:  [name], [name]                        ACCOUNT NO.:  [id number]   MEDICAL RECORD NO.:  [id number]                   Lawson TYPE:  AMB   LOCATION:  DAY                                  FACILITY:  [facility]   PHYSICIAN:  Almedia Balls. Fore, M.D.                DATE OF BIRTH:  05-17-1961   DATE OF PROCEDURE:  01/28/2003  DATE OF DISCHARGE:                                 OPERATIVE REPORT   PREOPERATIVE DIAGNOSES:  1. Abnormal uterine bleeding.  2. Uterine enlargement.  3. Pelvic pain.  4. History of endometriosis.   POSTOPERATIVE DIAGNOSES:  1. Abnormal uterine bleeding.  2. Uterine enlargement.  3. Pelvic pain.  4. History of endometriosis.   Pending pathology.   PROCEDURE:  Abdominal supracervical hysterectomy, lysis of adhesions.   ANESTHESIA:  General orotracheal.   SURGEON:  Almedia Balls. [name], M.D.   ASSISTANT:  Leona Singleton, M.D.   INDICATION FOR PROCEDURE:  The Lawson is a 50 year old with the above-noted  problem, who was counseled as to the need for surgery to treat these  problems and the type of surgery to be performed.  She was fully counseled  as to the nature of the procedure and the risks involved, to include risks  of anesthesia, injury to bowel, blood vessels, ureters, postoperative  hemorrhage, infection, recuperation, possible hormone replacement should her  ovaries be removed.  She fully understands all these considerations and  wishes to proceed on 01/28/03.   OPERATIVE FINDINGS:  An attempt was made to effect descent of the uterus for  a laparoscopic approach to this hysterectomy, which was unsuccessful because  of the size of the uterus.  It was later found that there were adhesions  involving the descending rectosigmoid to the left broad ligament, which  likewise limited the mobility of the uterus.  Exploration of the upper  abdomen revealed some adhesion and loops of bowel to the right upper  quadrant in the area of the gallbladder.  The lower  liver edge, spleen,  periaortic areas, appendix, ovaries were normal.  The uterus was enlarged  and somewhat soft.   DESCRIPTION OF PROCEDURE:  With the Lawson under general anesthesia,  prepared and draped in the usual sterile fashion in the dorsal lithotomy  position, a speculum was placed in the vagina and the anterior lip of the  cervix was grasped with a single-tooth tenaculum.  An attempt was made to  effect descent of the uterus, which was unsuccessful.  Accordingly, it was  felt that proceeding with an abdominal procedure was indicated.   A Foley catheter was placed, and the Lawson was positioned for an abdominal  procedure.  A lower abdominal transverse incision was made and carried into  the peritoneal cavity without difficulty.  A self-retaining retractor was  placed, and the bowel was packed off.  The adhesions involving the  descending rectosigmoid to the left broad ligament  area were lysed using  Bovie electrocoagulation for improved mobility of the uterus.  Kelly clamps  were then placed across the uterine-ovarian anastomoses, tubes, and round  ligaments bilaterally for traction and hemostasis.  The round ligaments were  transected bilaterally using Bovie electrocoagulation with development of a  bladder flap anteriorly and entry into the retroperitoneal space.  Because  the ovaries were normal, it was felt that these structures should be  conserved.  Accordingly, Heaney clamps were placed proximal to the ovaries  and cut free of the uterus.  The ovarian pedicles were then doubly tied with  1 chromic catgut.  Uterine vessels were skeletonized bilaterally, clamped,  cut, and suture ligated with 1 chromic catgut.  Heaney clamps were used to  clamp the remaining portions of the broad ligaments and cardinal ligaments  bilaterally, which were then cut free and suture ligated individually with 1  chromic catgut.  It was then possible to core out the endocervix using Bovie   electrocoagulation, which was done with good hemostasis.  Small bleeders  were rendered hemostatic with Bovie electrocoagulation.  The cervix was  reapproximated and rendered hemostatic with interrupted figure-of-eight  sutures of #1 chromic catgut.  The area was lavaged with copious amounts of  lactated Ringer's solution, and after noting that hemostasis was maintained  and that sponge and instrument counts were correct and after  reperitonealization of the surgical area and suspension of the ovaries to  the round ligaments bilaterally, the peritoneum was closed with a continuous  suture of 0 Vicryl.  Fascia was closed with two sutures of 0 Vicryl, which  were brought from the lateral aspects of the incision and tied separately in  the midline.  Subcutaneous fat was reapproximated with interrupted sutures  of 0 Vicryl.  Skin was closed with a subcuticular suture of 3-0 plain  catgut.  Each layer was lavaged with copious amounts of lactated Ringer's  solution prior to closure.  The estimated blood loss 150 mL.  The Lawson  was taken to the recovery room in good condition with clear urine in the  Foley catheter tubing.  She will be placed on 23-hour observation following  surgery.                                               Almedia Balls. [name], M.D.    SRF/MEDQ  D:  01/28/2003  T:  01/28/2003  Job:  [id number]   cc:   Leona Singleton, M.D.  [address]  [place]  [place] [address]  Fax: [phone]

## 2011-06-01 ENCOUNTER — Telehealth: Payer: Self-pay | Admitting: Gastroenterology

## 2011-06-01 MED ORDER — RABEPRAZOLE SODIUM 20 MG PO TBEC: 20.0000 mg | DELAYED_RELEASE_TABLET | Freq: Every day | ORAL | Status: DC

## 2011-06-01 NOTE — Telephone Encounter (Signed)
lmom for pt; ok to switch to Aciphex, if she needs a script, please call back.

## 2011-06-01 NOTE — Telephone Encounter (Signed)
ANY PPI IS OK.Marland KitchenMarland Kitchen

## 2011-06-01 NOTE — Telephone Encounter (Signed)
Pt reports she's beginning to lose her hair which began 3-4 weeks ago. She saw Dr Chevis Pretty who drew a Thyroid panel, but it was normal. She spoke with a pharmacist who stated it could be the Dexilant, blocking nutrient absorption to her hair. Pt has tried Aciphex before and tolerated it she thinks; she stated she did not do well with omeprazole.

## 2011-06-13 ENCOUNTER — Encounter (INDEPENDENT_AMBULATORY_CARE_PROVIDER_SITE_OTHER): Payer: 59 | Admitting: Surgery

## 2011-06-24 ENCOUNTER — Telehealth: Payer: Self-pay | Admitting: Pulmonary Disease

## 2011-06-24 ENCOUNTER — Telehealth: Payer: Self-pay | Admitting: Gastroenterology

## 2011-06-24 DIAGNOSIS — L659 Nonscarring hair loss, unspecified: Secondary | ICD-10-CM

## 2011-06-24 NOTE — Telephone Encounter (Signed)
Advised her she will be on dexilant long term and she can take caltrate with vit d if she is concerned about osteoporosis

## 2011-06-24 NOTE — Telephone Encounter (Signed)
I spoke with the pt and she states she has been having issues with hair loss, and fatigue so her GYN ordered a TSH level and told her it was abnormal and advised her to start on thyroid medication. Pt wanted to know what SN recs were. She did not know the TSH level so I called her GYN Dr. Chevis Pretty and requested the results be faxed for SN to review. I will await fax. Pt aware SN out until Monday. Carron Curie, CMA   Fax received and phone note printed and placed on SN look-at. Carron Curie, CMA

## 2011-06-28 NOTE — Telephone Encounter (Signed)
Per SN---received labs and these have been reviewed--and her tsh is normal---its not her thyroid---this corresponds to our labs that were done and they have all been normal here.  recs are to send pt to dermatology  asap for eval of hair loss. lmomtcb for pt

## 2011-06-29 DIAGNOSIS — L659 Nonscarring hair loss, unspecified: Secondary | ICD-10-CM | POA: Insufficient documentation

## 2011-06-29 NOTE — Telephone Encounter (Signed)
Called and spoke with pt and she is aware per SN that her thyroid is normal and she will not need thyroid meds.  She is aware that we will set up appt for her to see dermatology for her hair loss.  Pt is aware that we will call once appt has been made and she wanted SN to know that she is using a personal trainer at this time and she is feeling much better.

## 2011-06-29 NOTE — Telephone Encounter (Signed)
Pt retuning call to triage, call [phone] [place]

## 2011-07-05 ENCOUNTER — Encounter (INDEPENDENT_AMBULATORY_CARE_PROVIDER_SITE_OTHER): Payer: Self-pay | Admitting: General Surgery

## 2011-07-06 ENCOUNTER — Encounter (INDEPENDENT_AMBULATORY_CARE_PROVIDER_SITE_OTHER): Payer: Self-pay | Admitting: Surgery

## 2011-07-06 ENCOUNTER — Ambulatory Visit (INDEPENDENT_AMBULATORY_CARE_PROVIDER_SITE_OTHER): Payer: 59 | Admitting: Surgery

## 2011-07-06 VITALS — Temp 99.1°F

## 2011-07-06 DIAGNOSIS — K219 Gastro-esophageal reflux disease without esophagitis: Secondary | ICD-10-CM

## 2011-07-06 NOTE — Progress Notes (Signed)
[name] comes in today in followup. I last saw her on May 4 regarding her gastroesophageal erosions on a recent upper endoscopy. An upper GI was performed which showed no evidence of reflux and no significant hiatal hernia.  She comes in today having had a chemical peel of her face. She's been having quite a bit of discomfort with that. She raised concerns about being on Dexilant long-term and its effect on osteoporosis.  I suggested that she try to stop excellent and see how symptomatic her GERD  is.  If she is not having any problems after stopping the excellent and I think she could discuss the pros and cons of PPI therapy with Dr. Jarold Motto.  If she has symptomatic GERD and I think she will have to decide if she wants to go ahead and pursue a laparoscopic Nissen fundoplication.  I will see her again in 2 months and followup unless she wants to move adequately. She is to do the trial of stopping her Dexilant and see Dr. Jarold Motto.

## 2011-07-24 ENCOUNTER — Other Ambulatory Visit: Payer: Self-pay | Admitting: Pulmonary Disease

## 2011-07-27 ENCOUNTER — Other Ambulatory Visit: Payer: Self-pay | Admitting: *Deleted

## 2011-07-27 MED ORDER — ALPRAZOLAM 0.5 MG PO TABS: ORAL_TABLET | ORAL | Status: DC

## 2011-08-03 ENCOUNTER — Telehealth: Payer: Self-pay | Admitting: Gastroenterology

## 2011-08-03 MED ORDER — DEXLANSOPRAZOLE 60 MG PO CPDR: 60.0000 mg | DELAYED_RELEASE_CAPSULE | Freq: Every day | ORAL | Status: DC

## 2011-08-03 NOTE — Telephone Encounter (Signed)
Pt reports her insurance has changed from [facility] to [facility] and she doesn't think they will pay for Dexilant; asked for samples and I gave her 15. Asked her to have pharmacy sent [place] a prior auth and we will send it in; pt stated understanding.

## 2011-08-05 ENCOUNTER — Telehealth: Payer: Self-pay | Admitting: *Deleted

## 2011-08-05 MED ORDER — PANTOPRAZOLE SODIUM 40 MG PO TBEC: 40.0000 mg | DELAYED_RELEASE_TABLET | Freq: Every day | ORAL | Status: DC

## 2011-08-05 NOTE — Telephone Encounter (Signed)
Covered PPIs include Omeprazole, Zegerid, protonix, prevacid, I have dced the Dexilant and will sent Protonix.

## 2011-08-22 ENCOUNTER — Encounter (INDEPENDENT_AMBULATORY_CARE_PROVIDER_SITE_OTHER): Payer: Self-pay | Admitting: Surgery

## 2011-09-15 ENCOUNTER — Telehealth: Payer: Self-pay | Admitting: Pulmonary Disease

## 2011-09-15 ENCOUNTER — Telehealth (INDEPENDENT_AMBULATORY_CARE_PROVIDER_SITE_OTHER): Payer: Self-pay | Admitting: General Surgery

## 2011-09-15 NOTE — Telephone Encounter (Signed)
Can take Ibuprofen 200mg  3 tabs Twice daily  With food  Alternate ice and heat  Please contact office for sooner follow up if symptoms do not improve or worsen or seek emergency care

## 2011-09-15 NOTE — Telephone Encounter (Signed)
Patient states that you asked her to come off her dexilant for several weeks, pt still having problems with GERD while on medication, would like to know if it is a necessity to go off the dexilant and can we proceed with the nissen before the end of the year.

## 2011-09-15 NOTE — Telephone Encounter (Signed)
Pt is aware of recs per TP and will call if her symptoms do not improve or seek emergency care if needed.

## 2011-09-15 NOTE — Telephone Encounter (Signed)
Pt says while exercising last night she pulled muscles in her back and she is having muscle spasms. She did see a chiropractor this morning and they gave her instructions of using heat this weekend. She wants to know if there is anything she can take that will help her relax and sleep at night. Pls advise. Allergies  Allergen Reactions  . Codeine     REACTION: nausea,vomitting and dizziness  . Librax     "loopy"  . Penicillins     REACTION: hives ---taken as a child

## 2011-09-19 ENCOUNTER — Other Ambulatory Visit: Payer: Self-pay | Admitting: Gastroenterology

## 2011-09-19 ENCOUNTER — Telehealth: Payer: Self-pay | Admitting: *Deleted

## 2011-09-19 MED ORDER — DEXLANSOPRAZOLE 60 MG PO CPDR: 60.0000 mg | DELAYED_RELEASE_CAPSULE | Freq: Every day | ORAL | Status: DC

## 2011-09-19 MED ORDER — DEXLANSOPRAZOLE 30 MG PO CPDR: 30.0000 mg | DELAYED_RELEASE_CAPSULE | Freq: Every day | ORAL | Status: DC

## 2011-09-19 NOTE — Telephone Encounter (Signed)
yes

## 2011-09-19 NOTE — Telephone Encounter (Signed)
Dr Jarold Motto, can pt decrease her Dexilant to 30mg  daily?

## 2011-09-19 NOTE — Telephone Encounter (Signed)
Notified pt Dr Jarold Motto will order the 30mg  Dexilant.

## 2011-09-19 NOTE — Telephone Encounter (Signed)
Received a call from the [facility] pharmacist stating she received a refill for Dexilant 30mg  and they had sent a refill request for 60mg  tabs. Again, spoke with pt who stated she requested 30mg  in order to try to cut down on the med. She will however, take 30 tabs of 60 mg while I work on the prior [place].

## 2011-09-21 ENCOUNTER — Telehealth: Payer: Self-pay | Admitting: *Deleted

## 2011-09-21 ENCOUNTER — Telehealth: Payer: Self-pay | Admitting: Gastroenterology

## 2011-09-21 MED ORDER — DEXLANSOPRAZOLE 60 MG PO CPDR: 60.0000 mg | DELAYED_RELEASE_CAPSULE | Freq: Every day | ORAL | Status: DC

## 2011-09-21 NOTE — Telephone Encounter (Signed)
Called patient and left a message. I called [employer] for Candace Freer RN and per [name]. They will not cover Dexilant 30mg  but will continue to cover Dexilant 60mg . [name] and will wait for patient call back

## 2011-09-21 NOTE — Telephone Encounter (Signed)
Notified pt Prior [name] has not gone thru- will leave her samples at the front desk.

## 2011-09-22 NOTE — Telephone Encounter (Signed)
Prior auth still hasn't gone thru.

## 2011-09-22 NOTE — Telephone Encounter (Signed)
Notified pt her script finally went through; pt stated understanding.

## 2011-10-21 ENCOUNTER — Telehealth: Payer: Self-pay | Admitting: Pulmonary Disease

## 2011-10-21 NOTE — Telephone Encounter (Signed)
Called and spoke with pt and she stated that for her CPX--she will come in to see SN and we will give her rx that lists her labs to be done so she can take this to [facility].  Pt is aware that we will put a fax number on the orders so they can fax the results back to SN.

## 2011-10-25 ENCOUNTER — Ambulatory Visit (INDEPENDENT_AMBULATORY_CARE_PROVIDER_SITE_OTHER): Payer: BC Managed Care – PPO | Admitting: Pulmonary Disease

## 2011-10-25 ENCOUNTER — Encounter: Payer: Self-pay | Admitting: Pulmonary Disease

## 2011-10-25 VITALS — BP 100/60 | HR 60 | Temp 98.4°F | Resp 16 | Ht 71.0 in | Wt 179.0 lb

## 2011-10-25 DIAGNOSIS — Q2111 Secundum atrial septal defect: Secondary | ICD-10-CM

## 2011-10-25 DIAGNOSIS — I8390 Asymptomatic varicose veins of unspecified lower extremity: Secondary | ICD-10-CM

## 2011-10-25 DIAGNOSIS — M545 Low back pain, unspecified: Secondary | ICD-10-CM

## 2011-10-25 DIAGNOSIS — Z23 Encounter for immunization: Secondary | ICD-10-CM

## 2011-10-25 DIAGNOSIS — K219 Gastro-esophageal reflux disease without esophagitis: Secondary | ICD-10-CM

## 2011-10-25 DIAGNOSIS — F411 Generalized anxiety disorder: Secondary | ICD-10-CM

## 2011-10-25 DIAGNOSIS — J309 Allergic rhinitis, unspecified: Secondary | ICD-10-CM

## 2011-10-25 DIAGNOSIS — Z87898 Personal history of other specified conditions: Secondary | ICD-10-CM

## 2011-10-25 DIAGNOSIS — Q211 Atrial septal defect: Secondary | ICD-10-CM

## 2011-10-25 DIAGNOSIS — K589 Irritable bowel syndrome without diarrhea: Secondary | ICD-10-CM

## 2011-10-25 DIAGNOSIS — Z0000 Encounter for general adult medical examination without abnormal findings: Secondary | ICD-10-CM

## 2011-10-25 MED ORDER — ELETRIPTAN HYDROBROMIDE 40 MG PO TABS: ORAL_TABLET | ORAL | Status: DC

## 2011-10-25 NOTE — Patient Instructions (Signed)
Today we updated your med list in our EPIC system...    Continue your current medications the same...    We refilled your Relpax per request...  We wrote a prescription for labs to be done at [facility]...    We will call you w/ the results when available...  We gave you the 2012 flu vaccine today...  Call for any questions.Marland KitchenMarland Kitchen

## 2011-10-25 NOTE — Progress Notes (Signed)
Subjective:    Patient ID: [name], female    DOB: 01-24-1961, 50 y.o.   MRN: [id number]  HPI  [name] here for a follow up visit...  she has multiple medical problems including hx ASD repair 1992, VV & VI, HH/ GERD, IBS improved on gluten free diet, hx LBP, hx migraines, anxiety...  ~  Aug11:  add-on to discuss anxiety issues> started w/ stress at work, then subconjuctival hemorrhage, & muscle contracton HA... she saw eye doc, then DrFreeman who gave her shots in neck & head for the pain... she noted hx eye prob as child w/ ?retinal hem that resolved & no known recurrence since then... we discussed Rx w/ Alprazolam but she can only tol 1/4 tab at a time (1/2 tab makes her sleepy).  ~  December 20, 2010:  Add-on at her request to discuss GYN (DrMezer) wanting to start Estrogen- discussed w/ pt... she also notes 3-4 "episodes" of feeling poorly, low energy, fatigue- "couldn't even  lift head";  rare palpit & advised no caffeine etc but she is concerned & we will refer to Cards for check up (she saw DrLittle in the past)... GI eval 12/11 by DrPatterson- hx redundant colon, hx volvulus, constip- all improved w/ gluten free diet she says;  Sonar WNL, LABS reported WNL per pt (done by GI at [facility])...  ~  March 22, 2011:  Add-on appt due to her extreme anxiety over her health condition> [name] is anxious, tearful, almost hysterical over her medical problems & restrictions she has placed on herself due to the recent EGD w/ 4cmHH & erosions found- she describes all the things she can't eat & can't do because of the handout she received;  But her anxiety issue are truly generalized & extend to mult organ systems as below;  Unfortunately she is intol to Rebeca Allegra stating that she gets too sedated w/ even 0.125mg  of this med;  We provided reassurance to the pt & husb today, and [name] in GI is going to counsel her about the appropriate GI restrictions w/ her HH...    HAs>  She is followed by  DrFreeman w/ prev trigger point injections for cervicogenic HAs, Migraines, ?FM; he treats w/ Relpax40mg  prn...    Dyspnea>  Seen by TP w/ dyspnea presentation: exam neg; CXR clear; tried to reassure pt about her resp status...    Cards>  Seen by [facility] 1/12 w/ hx ASD repair yrs ago & stable; they noted "gasping SOB", palpit, CP, etc; they planned Echo & Myoview for completeness...    GI>  Seen by [facility] w/ mult GI complaints & known IBS (constip predom), redundant colon, hx volvulus, etc; she prev noted that everything was better on a gluten free diet but apparently symptoms increased & her chr anxiety lead to full GI work up including BE, EGD, Colonoscopy, etc;  She had a 4cmHH w/ some GE erosions treated w/ PPI (Dexilant) but continued complaints lead to a [facility] referral which is pending;  She is very distraught over her restrictions both dietary & physically- to the point of tears in the office today;  We discussed a milder, reasonable adjustment in lifestyle (no spicey foods- otherw OK, & Ok to pick up grandchild & do her water exercises)...    GYN>  Seen by DrMezer for a complex right ovarian cyst that appears to be benign...    Psyche>  She clearly has a generalized anxiety disorder & in need of psychiatric consultation &  counseling (she declines)...   ~  October 25, 2011:  39mo ROV & CPX today (she had EKG by [facility] 1/12, & CXR here 3/12)> we discussed her last visit & she states "I was sick";  OK flu shot today & she will get fasting labs from [facility]; See prob list below >>    GI> she remains on Dexilant daily & doing well; has seen DrMMartin & UGIseries didn't show much of a hernia & otherw wnl, mod stool burden; she wonders about proceeding w/ Nissen but advised to continue PPI & reasonable antireflux accommodations instead; she notes that [facility] did a manipulation that "rearranged" her [facility] & she feels better...    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    Anxiety> on Alprazolam 0.5mg  taking 1/2 tab about every other day on ave; DrPatterson tried her on Celexa but she was intol "it didn't work for me"...          Problem List:   PHYSICAL EXAMINATION (ICD-V70.0) - she is up-to-date on needed screening procedures... GYN= DrMezer every Aug w/ PAP, Mammogram, & she's had one BMD (due to mother's hx)- told normal. ~  NOTE: she works at [employer] and does some inspection that required her to wear a mask ("respirator")... she had employer sponsored PFT's at [facility] 09/21/09 showing FVC= 4.86 (109%), FEV1= 3.79 (107%), %1sec= 78, & mid-flows= 83% predicted... these are normal numbers but the [facility] PA suggested a medical eval (?why)>  pt has no hx resp problems other than some mild allergies, never smoked, etc... she is cleared to wear the respirator & letter written.  ALLERGY (ICD-995.3) - prev on shots per DrESL, off for some time now and doing satis... takes ZYRTEK 10mg /d Prn...  Hx of ATRIAL SEPTAL DEFECT (ICD-745.5) - S/P ASD repair 1992 by DrGearhardt... doing well without CP, palpit, dizzy, syncope, dyspnea etc... 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 ~  1/12: pt notes occas palpit & atyp CP> she would like cardiac eval & was set up to see DrLittle... ~  1/12: Cardiac eval DrHarding,[facility]> hx ASD repair by DrGearhardt 1989, occas fluttering episodes, no CP/ angina; he reports doing an EKG= SBrady at 54, NSSTTWA, NAD;  2DEcho reported to show norm LV size & function w/ EF>55%, mild LAdil w/ mild MR/ TR, triv effusion, othrew neg;  Nuclear Stress test showed breast attenuation, no ischemia, no infarct, exercise  capacity >49mets...  VARICOSE VEINS, LOWER EXTREMITIES (ICD-454.9) - full eval by DrEarly... ~  11/08 LE Venous Reflux Exam showed right GSV reflux... ~  s/p laser surg 12/08 w/ laser ablation right GSV by DrEarly & improved.  ?of HH/ GERD >> GI eval by DrPatterson   IRRITABLE BOWEL SYNDROME (ICD-564.1) - hx severe constipation, redundant colon & hx cecal volvulus... she's been taking MIRALAX & Metamucil w/ control of symptoms... last saw DrPatterson 9/09 & note reviewed- he checked celiac dis antibody test (all neg)... she states all symptoms resolved on Gluten free diet. ~  normal colonoscopy 5/04 by DrPatterson.   Hx of INTESTINAL VOLVULUS, LARGE BOWEL (ICD-560.2) - cecal volvulus 5/07 on CT in ER, resolved  after BE without surgery.  Hx of ENDOMETRIOSIS (ICD-617.9) - s/p hysterectomy for severe endometriosis problem.  BACK PAIN, LUMBAR (ICD-724.2) - eval by DrGioffre and DrRamos in 2006. ~  She tells me that DrRendall diagnosed 3 deteriorating discs & rec surgery; she went to [facility] who notes sl scoliosis & keeps her in-line w/ adjustments Q2wks that help...  LEG PAIN (ICD-729.5) - eval by DrEarly and DrRendall... right ankle was fractured while hiking in 2008> they didn't find it for 6 months- finally had right ankle surgery w/ pinning 2/09 by DrRendall (MRI w/ "chipped bone" and DrRendall did arthroscopic surg right ankle 2/09)...  MIGRAINES, HX OF (ICD-V13.8) - on RELPAX 40mg  prn per DrFreeman- she asked me to refill this prn med for her...  ANXIETY (ICD-300.00) - on ALPRAZOLAM 0.5mg  Prn >> we discussed taking 1/4 tab by mouth Bid-Tid regularly.   Past Surgical History  Procedure Date  . Repair of asd 1990  . Hysterectomy for severe endometriosis   . Right leg fracture with surgery     done with ankle surgery  . Varicose laser vein surgery   . Right ankle surgery     Outpatient Encounter Prescriptions as of 10/25/2011  Medication Sig Dispense Refill  .  ALPRAZolam (XANAX) 0.5 MG tablet Take 1/2 to 1 tablet by mouth three times daily as needed for nerves  90 tablet  5  . B Complex-Biotin-FA (B COMPLEX 100 TR PO) Take 1 tablet by mouth daily.        . cetirizine (ZYRTEC) 10 MG tablet Take 10 mg by mouth daily.        . Coenzyme Q10 (COQ10) 100 MG CAPS Take 1 capsule by mouth daily.        [name] DEXILANT 60 MG capsule Take 1 capsule by mouth daily.      [name] eletriptan (RELPAX) 40 MG tablet One tablet by mouth as needed for migraine headache.  If the headache improves and then returns, dose may be repeated after 2 hours have elapsed since first dose (do not exceed 80 mg per day). may repeat in 2 hours if necessary       . estradiol (VIVELLE-DOT) 0.075 MG/24HR Place 1 patch onto the skin 2 (two) times a week.        . Multiple Vitamin (MULTIVITAMINS PO) Take 1 tablet by mouth daily.        . Omega-3 Fatty Acids (FISH OIL) 1000 MG CAPS Take 1 capsule by mouth daily.        . polyethylene glycol powder (MIRALAX) powder Take 17 g by mouth at bedtime.       . citalopram (CELEXA) 10 MG tablet Take 1 tablet (10 mg total) by mouth daily.  30 tablet  11  . Evening Primrose Oil 500 MG CAPS Take 1 capsule by mouth at bedtime.        . Melatonin 5 MG CAPS Take 1 capsule by mouth at bedtime as needed.        [name] DISCONTD: pantoprazole (PROTONIX) 40 MG tablet Take 1 tablet (40 mg total) by mouth daily.  30 tablet  9    Allergies  Allergen Reactions  . Codeine     REACTION: nausea,vomitting and dizziness  . Librax     "loopy"  . Penicillins     REACTION: hives ---taken as a child    Current Medications, Allergies, Past Medical History, Past Surgical History, Family History, and Social History were reviewed in [employer] record.  Review of Systems        See HPI - all other systems neg except as noted... The patient notes +HA, +dyspnea, +CP/ palpit, +Abd discomfort, +anxiety;  She denies anorexia, fever, weight loss, weight gain,  vision loss, decreased hearing, hoarseness, syncope, peripheral edema, prolonged cough, hemoptysis, melena, hematochezia, severe indigestion/heartburn, hematuria, incontinence, muscle weakness, suspicious skin lesions, transient blindness, difficulty walking, unusual weight change, abnormal bleeding, enlarged lymph nodes, and angioedema.     Objective:   Physical Exam     WD, WN, [name] in NAD... GENERAL:  Alert & oriented; pleasant & cooperative... She is distraught & tearful today... HEENT:  [place]/AT, EOM-wnl, PERRLA, Fundi-benign, EACs-clear, TMs-wnl, NOSE-clear, THROAT-clear & wnl. NECK:  Supple w/ fair ROM; no JVD; normal carotid impulses w/o bruits; no thyromegaly or nodules palpated; no lymphadenopathy. CHEST:  Clear to P & A; without wheezes/ rales/ or rhonchi... s/p ASD repair 1992. HEART:  Regular Rhythm; gr 1/6 SEM without rubs or gallops... ABDOMEN:  Soft & nontender; normal bowel sounds; no organomegaly or masses detected. EXT: without deformities or arthritic changes; +vv laser surg, ven insuffic, no edema... NEURO:  CN's intact; motor testing normal; sensory testing normal; gait normal & balance OK. DERM:  No lesions noted; no rash etc... PSYCHE:  She is very distraught & tearful, out of proportion to any real physical ailment...  RADIOLOGY DATA:  Reviewed in the EPIC EMR & discussed w/ the patient...  LABORATORY DATA:  Reviewed in the EPIC EMR & discussed w/ the patient...   Assessment & Plan:   Dyspnea>  pulm eval neg & CXR clear;  Pt given reassurance...  Cards>  Hx ASD repair; CP & dyspnea eval by [facility] w/ Myoview & 2DEcho...  GI>  Followed by DrPatterson w/ extensive eval as noted;  On Dexilant & reasonable antireflux restrictions...  GYN>  Followed by DrMezer for complex right ovarian cyst which he feels is asymtomatic;  Hx endometriosis w/ prev hysterectomy  ORTHO>  Followed by DrRendall as above, but her back is better after Chiropractor adjustments...  HAs>   Followed by DrFreeman w/ shots & Relpax which we have refilled for her...  Generalized Anxiety Disorder>  Her anxiety & concern are way out of proportion;  We spent 45+min going over her extensive recent medical evaluations & pointed out all the neg findings and good news;  She is sens to the Alprazolam Rx & we discussed Psychiatric consultation for med management & counseling to help her cope better;  We will try to set this up for her...  Other medical problems as noted.Marland KitchenMarland Kitchen

## 2011-10-27 ENCOUNTER — Encounter (INDEPENDENT_AMBULATORY_CARE_PROVIDER_SITE_OTHER): Payer: Self-pay | Admitting: Surgery

## 2011-11-10 ENCOUNTER — Telehealth: Payer: Self-pay | Admitting: Pulmonary Disease

## 2011-11-10 NOTE — Telephone Encounter (Signed)
Pt seen by SN and given rx for her labs to be done at [facility].  [facility] called and needed the diagnosis code for the labs---dx code given was v70.0.

## 2011-11-15 ENCOUNTER — Telehealth (INDEPENDENT_AMBULATORY_CARE_PROVIDER_SITE_OTHER): Payer: Self-pay | Admitting: Surgery

## 2011-11-15 NOTE — Telephone Encounter (Signed)
Pt states that she called last week and left a voice mail message for you regarding a couple of questions she needed to ask Dr. Kerry Fort, please call.

## 2011-11-16 ENCOUNTER — Telehealth: Payer: Self-pay | Admitting: Pulmonary Disease

## 2011-11-16 NOTE — Telephone Encounter (Signed)
We have not received her lab results back from [facility].  i will have to call and get these sent over and i will call her once they have faxed these and SN has reviewed her labs.  Thanks.

## 2011-11-16 NOTE — Telephone Encounter (Signed)
Spoke with pt and notified of recs per Leigh. She verbalized understanding. Will forward this back to Leigh until we receive the results.

## 2011-11-16 NOTE — Telephone Encounter (Signed)
lmomtcb for pt about her lab results per SN

## 2011-11-16 NOTE — Telephone Encounter (Signed)
I spoke with pt and she is requesting her lab results that she had done at [facility]. She states Dr. Kriste Basque ordered these for her to have done before thanksgiving. Please advise Dr. Kriste Basque, thanks

## 2011-11-17 NOTE — Telephone Encounter (Signed)
SN reviewed lab results from [facility]----all labs are normal.  Cont the same meds.  Called and spoke with pt and she is aware per SN of all normal labs.  Copy of labs placed in scan folder to be scanned into pts chart.  Pt will call for any other concerns.

## 2011-11-19 ENCOUNTER — Other Ambulatory Visit: Payer: Self-pay | Admitting: Gastroenterology

## 2011-11-23 ENCOUNTER — Encounter: Payer: Self-pay | Admitting: Pulmonary Disease

## 2011-11-28 ENCOUNTER — Telehealth: Payer: Self-pay | Admitting: Gastroenterology

## 2011-11-28 NOTE — Telephone Encounter (Signed)
Removed orders for mano and 24hour ph

## 2012-01-12 ENCOUNTER — Telehealth: Payer: Self-pay | Admitting: Pulmonary Disease

## 2012-01-12 NOTE — Telephone Encounter (Signed)
I do not see where the last Tetanus is documented in Epic, I have order the paper chart for review

## 2012-01-13 NOTE — Telephone Encounter (Signed)
I have placed pt on injection schedule to get TDAP injection. Nothing further was needed

## 2012-01-13 NOTE — Telephone Encounter (Signed)
I have looked through pt's paper chart x 2 and have did not see where pt has ever had a tetanus shot. Pt states she is wanting to come in next week to have a tetanus shot. Please advise if okay to place pt on injections schedule Dr. Kriste Basque, thanks

## 2012-01-13 NOTE — Telephone Encounter (Signed)
Per SN----ok for her to have the tdap.  thanks

## 2012-01-16 ENCOUNTER — Ambulatory Visit (INDEPENDENT_AMBULATORY_CARE_PROVIDER_SITE_OTHER): Payer: BC Managed Care – PPO

## 2012-01-16 DIAGNOSIS — Z23 Encounter for immunization: Secondary | ICD-10-CM

## 2012-01-24 ENCOUNTER — Telehealth: Payer: Self-pay | Admitting: Gastroenterology

## 2012-01-25 NOTE — Telephone Encounter (Signed)
Pt reports a case of food poisoning on Saturday with N/V, diarrhea. She spoke with Dr Kriste Basque and he ordered something for nausea and Imodium. She has gradually increased her POs from Powerade diluted with water to straight Powerade and she is slowly advancing her diet. Is there anything else she can do? She remains concerned about her Hiatal Hernia, She has recently read about LYNX surgery for [facility]. A magnetic bracelet is wrapped around your esophagus and opens to allow food in, then closes to prevent reflux. She reports she can eat the next day and there's a 1 week out of work recovery. On the other hand, fundoplication requires a 3 week recuperation   And diet restrictions. 1) What do you think about the LYNX surgery and could she get a referral? 2) Is there anything else she can do for recovery from food poisoning- she is eating yogurt and taking a Probiotic?  Thanks, no capital letters.

## 2012-01-25 NOTE — Telephone Encounter (Signed)
peptobismol 2 tabs tid,,,,She would need to call [facility] per surgery,,,

## 2012-01-25 NOTE — Telephone Encounter (Signed)
Informed pt to try Pepto Bismol tabs 2, tid. Dr Jarold Motto is not aware of the LYNX surgery; she can call should she decide to have it and I will ask for a referral then; pt stated understanding.

## 2012-02-10 ENCOUNTER — Telehealth: Payer: Self-pay | Admitting: Gastroenterology

## 2012-02-10 MED ORDER — ESCITALOPRAM OXALATE 10 MG PO TABS: ORAL_TABLET | ORAL | Status: DC

## 2012-02-10 NOTE — Telephone Encounter (Signed)
OK 

## 2012-02-10 NOTE — Telephone Encounter (Signed)
ok 

## 2012-02-10 NOTE — Telephone Encounter (Signed)
Notified pt that an interaction with Relpax came up when I ordered the Lexapro. The  Nurse's drug book states there may be a reaction when taken together: weakness, hyperreflexia, incoordination. Pt reports she hasn't taken the drug in several months, but is aware of the problems.

## 2012-02-10 NOTE — Telephone Encounter (Signed)
lmom for pt to call back. Dr Jarold Motto ok'd Lexapro, but when I brought it up, there is a warning with Relpax.

## 2012-02-10 NOTE — Telephone Encounter (Signed)
Pt reports problems with her marriage and at work due to the economy. She reports this has taken a toll on her stomach. Dr Jarold Motto started her on Celexa last May, but she states it's not helping. Her sister in law takes Lexapro and she wonders if you will order this. INFORMED PT SHE SHOULD SEE HER PCP FOR THIS, BUT SHE STATES DR NADEL WILL MAKE HER SEE A PSYCHOLOGIST. DR Jarold Motto, WILL YOU ORDER THE LEXAPRO? Thanks.

## 2012-03-12 ENCOUNTER — Encounter (INDEPENDENT_AMBULATORY_CARE_PROVIDER_SITE_OTHER): Payer: Self-pay | Admitting: Surgery

## 2012-03-14 ENCOUNTER — Encounter (INDEPENDENT_AMBULATORY_CARE_PROVIDER_SITE_OTHER): Payer: Self-pay | Admitting: Surgery

## 2012-03-14 ENCOUNTER — Telehealth (INDEPENDENT_AMBULATORY_CARE_PROVIDER_SITE_OTHER): Payer: Self-pay | Admitting: Surgery

## 2012-03-14 ENCOUNTER — Ambulatory Visit (INDEPENDENT_AMBULATORY_CARE_PROVIDER_SITE_OTHER): Payer: BC Managed Care – PPO | Admitting: Surgery

## 2012-03-14 VITALS — BP 88/58 | HR 62 | Temp 97.7°F | Resp 16 | Ht 71.0 in | Wt 175.1 lb

## 2012-03-14 DIAGNOSIS — K219 Gastro-esophageal reflux disease without esophagitis: Secondary | ICD-10-CM | POA: Insufficient documentation

## 2012-03-14 NOTE — Progress Notes (Signed)
[name] 51 y.o.  Body mass index is 24.42 kg/(m^2).  Patient Active Problem List  Diagnoses  . ANXIETY  . VARICOSE VEINS, LOWER EXTREMITIES  . ALLERGIC RHINITIS  . INTESTINAL VOLVULUS, LARGE BOWEL  . IRRITABLE BOWEL SYNDROME  . ENDOMETRIOSIS  . BACK PAIN  . LEG PAIN  . ATRIAL SEPTAL DEFECT  . MIGRAINES, HX OF  . CHEST PAIN, ATYPICAL  . GERD  . DYSPNEA  . Hair loss  . Annual physical exam  . GERD (gastroesophageal reflux disease)-probable paroxsysmal relaxation LES    Allergies  Allergen Reactions  . Codeine     REACTION: nausea,vomitting and dizziness  . Librax     "loopy"  . Penicillins     REACTION: hives ---taken as a child    Past Surgical History  Procedure Date  . Repair of asd 1990  . Hysterectomy for severe endometriosis   . Right leg fracture with surgery     done with ankle surgery  . Varicose laser vein surgery 2009  . Right ankle surgery 2010  . Abdominal hysterectomy 2004   Michele Mcalpine, MD, MD 1. GERD (gastroesophageal reflux disease)-probable paroxsysmal relaxation LES     [name] is interested in the Torx magnetic beads device for GER.  This has preliminary approval of the FDA.  I told her I will be going to [facility] in 2 weeks and would try to find out about the stage of approval of this device. I would be interested in performing the surgery and she would like to work with me on this. She has a number of health concerns and is trying to get more fit and also Ms. a minimal amount of work. She works for [employer] at [employer].   Plan is for her to return in 6 weeks and we will discuss the Torex device Matt B. Daphine Deutscher, MD, [facility] [phone] beeper [phone]  03/14/2012 10:46 AM

## 2012-03-29 ENCOUNTER — Telehealth: Payer: Self-pay | Admitting: Pulmonary Disease

## 2012-03-29 MED ORDER — AZELASTINE-FLUTICASONE 137-50 MCG/ACT NA SUSP: 2.0000 | Freq: Every day | NASAL | Status: DC

## 2012-03-29 NOTE — Telephone Encounter (Signed)
Called and spoke with pt and she stated that she has had this for about 2-3 weeks with the nasal congestion.  She has been taking the zyrtec and using the salt water saline spray without relief.  Pt stated that the left side of her nose gets stopped up at night while sleeping and this makes it hard for her to breath.  Small amount of congestion that is green in color but no fever or sore throat.  Pt has appt with TP on 4/24 but is wanting something called in for her now.  SN please advise. Thanks  Allergies  Allergen Reactions  . Codeine     REACTION: nausea,vomitting and dizziness  . Librax     "loopy"  . Penicillins     REACTION: hives ---taken as a child

## 2012-03-29 NOTE — Telephone Encounter (Signed)
Per SN---can try dymista  2 sprays each nostril at bedtime.  attemtped to call pt to make her aware but unable to speak with pt.  lmomtcb

## 2012-03-29 NOTE — Telephone Encounter (Signed)
Pt called back and she is aware of SN recs to try the dymista.  Pt is aware that this has been sent to her pharmacy.

## 2012-03-30 ENCOUNTER — Telehealth: Payer: Self-pay | Admitting: *Deleted

## 2012-03-30 NOTE — Telephone Encounter (Signed)
Received paper from pts pharmacy stating that the dymista requires PA.  Called and lmomtcb for pt to see if she wants to try fluticasone or if she would like for [place] to try to get the dymista approved.

## 2012-04-02 MED ORDER — FLUTICASONE PROPIONATE 50 MCG/ACT NA SUSP: 2.0000 | Freq: Every day | NASAL | Status: DC

## 2012-04-02 NOTE — Telephone Encounter (Signed)
Called and spoke with pt and she is aware that her insurance will not cover the dymista without a PA---pt is ok to change to fluticasone instead.  This has been sent to the pts pharmacy.

## 2012-04-02 NOTE — Telephone Encounter (Signed)
Pt return call. Please cb at [phone]

## 2012-04-04 ENCOUNTER — Ambulatory Visit: Payer: BC Managed Care – PPO | Admitting: Adult Health

## 2012-04-13 ENCOUNTER — Encounter (INDEPENDENT_AMBULATORY_CARE_PROVIDER_SITE_OTHER): Payer: Self-pay | Admitting: Surgery

## 2012-04-13 ENCOUNTER — Ambulatory Visit (INDEPENDENT_AMBULATORY_CARE_PROVIDER_SITE_OTHER): Payer: BC Managed Care – PPO | Admitting: Surgery

## 2012-04-13 VITALS — BP 104/62 | HR 54 | Temp 98.8°F | Resp 16 | Ht 71.0 in | Wt 173.0 lb

## 2012-04-13 DIAGNOSIS — K219 Gastro-esophageal reflux disease without esophagitis: Secondary | ICD-10-CM

## 2012-04-13 NOTE — Progress Notes (Signed)
[name] wants the Torax Linxx magnetic anti GER device.  I just attended these presentations at [facility] and am waiting to hear from the company about being a site.  Will see her back in 2 months.  She will be checking with her insurance about coverage and I will be waiting to hear from the company.

## 2012-05-24 ENCOUNTER — Other Ambulatory Visit: Payer: Self-pay | Admitting: Pulmonary Disease

## 2012-06-05 ENCOUNTER — Telehealth (INDEPENDENT_AMBULATORY_CARE_PROVIDER_SITE_OTHER): Payer: Self-pay | Admitting: General Surgery

## 2012-06-07 ENCOUNTER — Telehealth (INDEPENDENT_AMBULATORY_CARE_PROVIDER_SITE_OTHER): Payer: Self-pay | Admitting: General Surgery

## 2012-06-07 NOTE — Telephone Encounter (Signed)
The patient wanted to advise [place] she is not able to have surgery right now, will call back to schedule a visit with Dr Daphine Deutscher in Sept/oct

## 2012-06-07 NOTE — Telephone Encounter (Signed)
Message copied by Latricia Heft on Thu Jun 07, 2012  5:24 PM ------      Message from: Zacarias Pontes      Created: Tue Jun 05, 2012 10:29 AM       Pt has a question about her 7/3 visit.... [phone]

## 2012-06-09 ENCOUNTER — Other Ambulatory Visit: Payer: Self-pay | Admitting: Gastroenterology

## 2012-06-13 ENCOUNTER — Encounter (INDEPENDENT_AMBULATORY_CARE_PROVIDER_SITE_OTHER): Payer: BC Managed Care – PPO | Admitting: Surgery

## 2012-06-28 NOTE — Telephone Encounter (Signed)
Erroneous encounter

## 2012-07-03 ENCOUNTER — Ambulatory Visit (INDEPENDENT_AMBULATORY_CARE_PROVIDER_SITE_OTHER): Payer: BC Managed Care – PPO | Admitting: Gastroenterology

## 2012-07-03 ENCOUNTER — Encounter: Payer: Self-pay | Admitting: Gastroenterology

## 2012-07-03 VITALS — BP 92/60 | HR 64 | Ht 71.0 in | Wt 174.2 lb

## 2012-07-03 DIAGNOSIS — K5901 Slow transit constipation: Secondary | ICD-10-CM

## 2012-07-03 DIAGNOSIS — K219 Gastro-esophageal reflux disease without esophagitis: Secondary | ICD-10-CM

## 2012-07-03 MED ORDER — DEXLANSOPRAZOLE 60 MG PO CPDR: 60.0000 mg | DELAYED_RELEASE_CAPSULE | Freq: Every day | ORAL | Status: DC

## 2012-07-03 NOTE — Progress Notes (Signed)
History of Present Illness: This is a 51 year old Caucasian female with chronic GERD currently asymptomatic on Dexilant 60 mg a day and probiotic therapy. She has not had surgical GERD-reflux surgery, but has been evaluated previously by Dr. Luretha Murphy. We had a long discussion today concerning PPI therapy and long-term side effects. She is on a variety of herbal supplements, also when necessary MiraLax for constipation. Because of chronic anxiety syndrome she takes and ask 0.5 mg 3 times a day as needed. Otherwise, the patient is doing well without dysphagia, hepatobiliary or other general medical problems. She the past as had reactions to Librax, codeine, and penicillin.    Current Medications, Allergies, Past Medical History, Past Surgical History, Family History and Social History were reviewed in [employer] record.   Assessment and plan:Chronic GERD doing well on daily PPI therapy. I gave her printed information today concerning long-term PPI use, and have suggested and oral B12 supplement. Otherwise she is to continue Dexilant with when necessary MiraLax use. I see no problems with intermittent probiotic therapy. We will copy this note to Dr. Luretha Murphy At [facility]. No diagnosis found.

## 2012-07-03 NOTE — Patient Instructions (Addendum)
You have been given Dexilant samples and a prescription has been sent.

## 2012-08-14 ENCOUNTER — Telehealth: Payer: Self-pay | Admitting: Pulmonary Disease

## 2012-08-14 ENCOUNTER — Other Ambulatory Visit: Payer: Self-pay | Admitting: Pulmonary Disease

## 2012-08-14 DIAGNOSIS — Z0000 Encounter for general adult medical examination without abnormal findings: Secondary | ICD-10-CM

## 2012-08-14 NOTE — Telephone Encounter (Signed)
Labs have been placed in the computer for the pt.

## 2012-08-14 NOTE — Telephone Encounter (Signed)
Spoke with pt. She wants to come in the first wk of Nov for cpx labs to be done.  Please advise thanks! No need to call her back unless there is a question/problem thanks

## 2012-09-20 ENCOUNTER — Encounter: Payer: Self-pay | Admitting: Adult Health

## 2012-09-20 ENCOUNTER — Ambulatory Visit (INDEPENDENT_AMBULATORY_CARE_PROVIDER_SITE_OTHER): Payer: PRIVATE HEALTH INSURANCE | Admitting: Adult Health

## 2012-09-20 VITALS — BP 110/66 | HR 68 | Temp 97.0°F | Ht 71.0 in | Wt 171.6 lb

## 2012-09-20 DIAGNOSIS — M549 Dorsalgia, unspecified: Secondary | ICD-10-CM

## 2012-09-20 MED ORDER — METAXALONE 800 MG PO TABS: 800.0000 mg | ORAL_TABLET | Freq: Three times a day (TID) | ORAL | Status: DC | PRN

## 2012-09-20 MED ORDER — HYDROCODONE-ACETAMINOPHEN 5-325 MG PO TABS: 1.0000 | ORAL_TABLET | Freq: Four times a day (QID) | ORAL | Status: DC | PRN

## 2012-09-20 NOTE — Patient Instructions (Addendum)
May use Advil 200mg  2-3 tabs Twice daily  For 5 days , take w/ food  Skelaxin 800mg  Three times a day  As needed  Muscle spasm Alternate ice and heat.  Vicodin 1 every 6 hr As needed  Severe pain. May make you sleepy.  Please contact office for sooner follow up if symptoms do not improve or worsen or seek emergency care  follow up with Dr. Kriste Basque  For physical in 2 months
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
## 2012-10-03 ENCOUNTER — Encounter: Payer: Self-pay | Admitting: Adult Health

## 2012-10-03 ENCOUNTER — Ambulatory Visit (INDEPENDENT_AMBULATORY_CARE_PROVIDER_SITE_OTHER): Payer: BC Managed Care – PPO | Admitting: Adult Health

## 2012-10-03 VITALS — BP 108/70 | HR 51 | Temp 97.4°F | Ht 71.0 in | Wt 175.0 lb

## 2012-10-03 DIAGNOSIS — J069 Acute upper respiratory infection, unspecified: Secondary | ICD-10-CM

## 2012-10-03 MED ORDER — HYDROCODONE-HOMATROPINE 5-1.5 MG/5ML PO SYRP: 5.0000 mL | ORAL_SOLUTION | ORAL | Status: DC | PRN

## 2012-10-03 MED ORDER — AZITHROMYCIN 250 MG PO TABS: ORAL_TABLET | ORAL | Status: AC

## 2012-10-03 NOTE — Assessment & Plan Note (Signed)
Acute URI  Plan Mucinex DM twice daily as needed. For cough and congestion Saline nasal rinses twice daily and as needed. Begin Flonase 2 puffs twice daily. Zyrtec 10 mg daily as needed. For drainage Tylenol as needed. Fluids and rest. Z-Pak to have on hold if symptoms do not improve or worsen with discolored mucus Please contact office for sooner follow up if symptoms do not improve or worsen or seek emergency care    Ear irrigation for cerumen impaction

## 2012-10-03 NOTE — Patient Instructions (Addendum)
Mucinex DM twice daily as needed. For cough and congestion Saline nasal rinses twice daily and as needed. Begin Flonase 2 puffs twice daily. Zyrtec 10 mg daily as needed. For drainage Tylenol as needed. Fluids and rest. Z-Pak to have on hold if symptoms do not improve or worsen with discolored mucus Please contact office for sooner follow up if symptoms do not improve or worsen or seek emergency care
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
## 2012-10-03 NOTE — Addendum Note (Signed)
Addended by: Boone Master E on: 10/03/2012 10:11 AM   Modules accepted: Orders

## 2012-10-25 ENCOUNTER — Ambulatory Visit (INDEPENDENT_AMBULATORY_CARE_PROVIDER_SITE_OTHER): Payer: BC Managed Care – PPO | Admitting: Pulmonary Disease

## 2012-10-25 ENCOUNTER — Encounter: Payer: Self-pay | Admitting: Pulmonary Disease

## 2012-10-25 ENCOUNTER — Telehealth: Payer: Self-pay | Admitting: Pulmonary Disease

## 2012-10-25 VITALS — BP 112/72 | HR 62 | Temp 96.7°F | Ht 71.0 in | Wt 175.2 lb

## 2012-10-25 DIAGNOSIS — F411 Generalized anxiety disorder: Secondary | ICD-10-CM

## 2012-10-25 DIAGNOSIS — Z0000 Encounter for general adult medical examination without abnormal findings: Secondary | ICD-10-CM

## 2012-10-25 DIAGNOSIS — Q2111 Secundum atrial septal defect: Secondary | ICD-10-CM

## 2012-10-25 DIAGNOSIS — M79609 Pain in unspecified limb: Secondary | ICD-10-CM

## 2012-10-25 DIAGNOSIS — Z23 Encounter for immunization: Secondary | ICD-10-CM

## 2012-10-25 DIAGNOSIS — I8390 Asymptomatic varicose veins of unspecified lower extremity: Secondary | ICD-10-CM

## 2012-10-25 DIAGNOSIS — Z87898 Personal history of other specified conditions: Secondary | ICD-10-CM

## 2012-10-25 DIAGNOSIS — N809 Endometriosis, unspecified: Secondary | ICD-10-CM

## 2012-10-25 DIAGNOSIS — K219 Gastro-esophageal reflux disease without esophagitis: Secondary | ICD-10-CM

## 2012-10-25 DIAGNOSIS — K589 Irritable bowel syndrome without diarrhea: Secondary | ICD-10-CM

## 2012-10-25 DIAGNOSIS — M549 Dorsalgia, unspecified: Secondary | ICD-10-CM

## 2012-10-25 DIAGNOSIS — Q211 Atrial septal defect: Secondary | ICD-10-CM

## 2012-10-25 MED ORDER — HYDROCODONE-ACETAMINOPHEN 5-325 MG PO TABS: 1.0000 | ORAL_TABLET | Freq: Three times a day (TID) | ORAL | Status: DC | PRN

## 2012-10-25 MED ORDER — CELECOXIB 200 MG PO CAPS: 200.0000 mg | ORAL_CAPSULE | Freq: Two times a day (BID) | ORAL | Status: DC | PRN

## 2012-10-25 MED ORDER — CYCLOBENZAPRINE HCL 10 MG PO TABS: 10.0000 mg | ORAL_TABLET | Freq: Three times a day (TID) | ORAL | Status: DC | PRN
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
## 2012-10-25 NOTE — Patient Instructions (Addendum)
Today we updated your med list in our EPIC system...    Continue your current medications the same...  We wrote for an anti-inflamm medication (CELEBREX 200mg - take one cap twice daily w/ food)  We will arrange for an Orthopedic consultation w/ [facility] for your LBP...  Call for any questions or if we can be of service in any way.Marland KitchenMarland Kitchen

## 2012-10-25 NOTE — Telephone Encounter (Signed)
Strength of Hydrocodone was given to Weston Brass at [facility] on [address].

## 2012-11-02 ENCOUNTER — Telehealth: Payer: Self-pay | Admitting: Gastroenterology

## 2012-11-02 NOTE — Telephone Encounter (Signed)
lmom for pt to call back

## 2012-11-12 NOTE — Telephone Encounter (Signed)
Pt never called back.

## 2012-12-13 ENCOUNTER — Other Ambulatory Visit: Payer: Self-pay | Admitting: Pulmonary Disease

## 2013-02-21 ENCOUNTER — Telehealth: Payer: Self-pay | Admitting: Gastroenterology

## 2013-02-22 NOTE — Telephone Encounter (Signed)
Pt states she wakes up with dry, cotton-like mouth in the am and wants to make sure it's not related reflux. She is on Dexilant. Informed her it may be d/t sleeping with her mouth open d/t her age, but she wants to make sure it's not worsening reflux. When she 1st came in for GERD, she had upper abdominal pain. Can GERD cause dry mouth in the am? Thanks.

## 2013-02-22 NOTE — Telephone Encounter (Signed)
Informed pt that Dr Jarold Motto is not aware that Dexilant causes dry mouth; pt states understanding.

## 2013-02-22 NOTE — Telephone Encounter (Signed)
Not that I am aware of.

## 2013-04-21 ENCOUNTER — Other Ambulatory Visit: Payer: Self-pay | Admitting: Pulmonary Disease

## 2013-06-19 ENCOUNTER — Encounter: Payer: Self-pay | Admitting: *Deleted

## 2013-06-26 ENCOUNTER — Encounter: Payer: Self-pay | Admitting: Gastroenterology

## 2013-06-26 ENCOUNTER — Ambulatory Visit (INDEPENDENT_AMBULATORY_CARE_PROVIDER_SITE_OTHER): Payer: BC Managed Care – PPO | Admitting: Gastroenterology

## 2013-06-26 VITALS — BP 100/60 | HR 68 | Ht 70.47 in | Wt 177.0 lb

## 2013-06-26 DIAGNOSIS — K219 Gastro-esophageal reflux disease without esophagitis: Secondary | ICD-10-CM

## 2013-06-26 MED ORDER — DEXLANSOPRAZOLE 60 MG PO CPDR: 60.0000 mg | DELAYED_RELEASE_CAPSULE | Freq: Every day | ORAL | Status: DC

## 2013-06-26 NOTE — Progress Notes (Signed)
This is a very nice 52 year old Caucasian female who has chronic GERD proven by endoscopy which recently showed erosive esophagitis.  Attempts esophageal manometry been unsuccessful.  She currently is doing extremely well and denies any acid reflux symptoms or dysphagia.  She is concerned that she has been to the dentist and has had some black discoloration of her teeth which she attributes to possible GERD.  She otherwise denies any GI or general medical problems.  She takes Dexilant 60 mg every morning in addition to other medicines listed and reviewed.  She is up-to-date on her endoscopy and colonoscopy exams.  Current Medications, Allergies, Past Medical History, Past Surgical History, Family History and Social History were reviewed in [employer] record.  ROS: All systems were reviewed and are negative unless otherwise stated in the HPI.          Physical Exam: Healthy  appearing patient in no distress.  Blood pressure 100/60 pulse 68 and weight 177 with a BMI of 25.06.  Cannot appreciate stigmata of chronic liver disease.  Examination oral pharyngeal areas entirely unremarkable.  There is no thyromegaly or lymphadenopathy.,  Exam is unremarkable.  Mental status is normal    Assessment and Plan: Chronic GERD under good control on daily Dexilant therapy.  I do not think repeat endoscopy is needed at this time.  I have reviewed reflux and is management with this patient.  She does not have Barrett's mucosa on review of her previous endoscopic exams.  She is to call should she have worsening symptoms, dysphagia, recurrent chest pain.  Otherwise she is to continue medications as listed and reviewed. No diagnosis found.

## 2013-06-26 NOTE — Patient Instructions (Signed)
  New prescription for Dexilant was sent to your pharmacy.  Please follow up in one year or sooner if symptoms reoccur. ______________________________________________________________                                               We are excited to introduce MyChart, a new best-in-class service that provides you online access to important information in your electronic medical record. We want to make it easier for you to view your health information - all in one secure location - when and where you need it. We expect MyChart will enhance the quality of care and service we provide.  When you register for MyChart, you can:    View your test results.    Request appointments and receive appointment reminders via email.    Request medication renewals.    View your medical history, allergies, medications and immunizations.    Communicate with your physician's office through a password-protected site.    Conveniently print information such as your medication lists.  To find out if MyChart is right for you, please talk to a member of our clinical staff today. We will gladly answer your questions about this free health and wellness tool.  If you are age 75 or older and want a member of your family to have access to your record, you must provide written consent by completing a proxy form available at our office. Please speak to our clinical staff about guidelines regarding accounts for patients younger than age 47.  As you activate your MyChart account and need any technical assistance, please call the MyChart technical support line at [phone]) or email your question to [email or web address]. If you email your question(s), please include your name, a return phone number and the best time to reach you.  If you have non-urgent health-related questions, you can send a message to our office through MyChart at [email or web address]. If you have a medical emergency, call  [phone].  Thank you for using MyChart as your new health and wellness resource!   MyChart licensed from [employer],  [id number]. Patents Pending.

## 2013-06-26 NOTE — Addendum Note (Signed)
Addended by: Ok Anis A on: 06/26/2013 12:48 PM   Modules accepted: Orders

## 2013-08-26 ENCOUNTER — Telehealth: Payer: Self-pay | Admitting: Pulmonary Disease

## 2013-08-26 MED ORDER — LEVOFLOXACIN 500 MG PO TABS: 500.0000 mg | ORAL_TABLET | Freq: Every day | ORAL | Status: DC

## 2013-08-26 NOTE — Telephone Encounter (Signed)
I spoke with pt. She c/o cough w/ brown-green phlem, chest congestion, ears feels clogged, nasal congestion, chest tx, PND, slight sore throat x 1 week. She has been taking muciex 1200 mg BID, used netti pot, and took a steamed shower. Not helping. Please advise SN thanks Last OV 10/25/12  Allergies  Allergen Reactions  . Clidinium-Chlordiazepoxide     "loopy"  . Codeine     REACTION: nausea,vomitting and dizziness  . Penicillins     REACTION: hives ---taken as a child

## 2013-08-26 NOTE — Telephone Encounter (Signed)
Per SN  Call in levaquin 500 mg #7  1 daily Cont the mucinex, fluids, netti pot, etc.

## 2013-08-26 NOTE — Telephone Encounter (Signed)
I called and spoke with pt. She is aware and needed nothing further. RX sent

## 2013-10-22 ENCOUNTER — Telehealth: Payer: Self-pay | Admitting: Gastroenterology

## 2013-10-22 NOTE — Telephone Encounter (Signed)
Pt reports more hoarseness than usual and she is clearing her throat more around 10am. She reports taking Zyrtec for allergies. Advised her to try Mucinex for sinus drainage; it may not be reflux. Pt stated understanding.

## 2013-11-18 ENCOUNTER — Ambulatory Visit (INDEPENDENT_AMBULATORY_CARE_PROVIDER_SITE_OTHER): Payer: BC Managed Care – PPO

## 2013-11-18 DIAGNOSIS — Z23 Encounter for immunization: Secondary | ICD-10-CM

## 2013-12-02 ENCOUNTER — Ambulatory Visit: Payer: Self-pay | Admitting: Cardiology

## 2013-12-04 ENCOUNTER — Telehealth: Payer: Self-pay | Admitting: Pulmonary Disease

## 2013-12-04 NOTE — Telephone Encounter (Signed)
I spoke with the pt and she states that she has to get her labs done at [facility] now and would like to wait until after oV with SN to have these done and wants to make sure this was fine. I advised this is fine to do. Carron Curie, CMA

## 2013-12-11 ENCOUNTER — Encounter: Payer: Self-pay | Admitting: Podiatry

## 2013-12-11 ENCOUNTER — Ambulatory Visit: Payer: BC Managed Care – PPO | Admitting: Podiatry

## 2013-12-11 VITALS — BP 100/59 | HR 57 | Resp 16 | Ht 71.0 in | Wt 176.0 lb

## 2013-12-11 DIAGNOSIS — B351 Tinea unguium: Secondary | ICD-10-CM

## 2013-12-11 DIAGNOSIS — M779 Enthesopathy, unspecified: Secondary | ICD-10-CM

## 2013-12-11 DIAGNOSIS — Q828 Other specified congenital malformations of skin: Secondary | ICD-10-CM

## 2013-12-11 DIAGNOSIS — L84 Corns and callosities: Secondary | ICD-10-CM

## 2013-12-11 NOTE — Progress Notes (Signed)
   Subjective:    Patient ID: [name], female    DOB: [birth year], 52 y.o.   MRN: [id number]  HPI Comments: "I've got a callus and a couple of nails that need to be checked"  Pt has callused area plantar forefoot right for years. Getting thicker and sometimes painful after walking a lot. Files it down occasionally  Also her 1st nails bilateral are thick and discolored. Interested in laser treatment, but concerned about cost.     Review of Systems  Skin:       Change in nails  All other systems reviewed and are negative.       Objective:   Physical Exam        Assessment & Plan:

## 2013-12-11 NOTE — Patient Instructions (Signed)
Onychomycosis/Fungal Toenails  WHAT IS IT? An infection that lies within the keratin of your nail plate that is caused by a fungus.  WHY ME? Fungal infections affect all ages, sexes, races, and creeds.  There may be many factors that predispose you to a fungal infection such as age, coexisting medical conditions such as diabetes, or an autoimmune disease; stress, medications, fatigue, genetics, etc.  Bottom line: fungus thrives in a warm, moist environment and your shoes offer such a location.  IS IT CONTAGIOUS? Theoretically, yes.  You do not want to share shoes, nail clippers or files with someone who has fungal toenails.  Walking around barefoot in the same room or sleeping in the same bed is unlikely to transfer the organism.  It is important to realize, however, that fungus can spread easily from one nail to the next on the same foot.  HOW DO WE TREAT THIS?  There are several ways to treat this condition.  Treatment may depend on many factors such as age, medications, pregnancy, liver and kidney conditions, etc.  It is best to ask your doctor which options are available to you.  1. No treatment.   Unlike many other medical concerns, you can live with this condition.  However for many people this can be a painful condition and may lead to ingrown toenails or a bacterial infection.  It is recommended that you keep the nails cut short to help reduce the amount of fungal nail. 2. Topical treatment.  These range from herbal remedies to prescription strength nail lacquers.  About 40-50% effective, topicals require twice daily application for approximately 9 to 12 months or until an entirely new nail has grown out.  The most effective topicals are medical grade medications available through physicians offices. 3. Oral antifungal medications.  With an 80-90% cure rate, the most common oral medication requires 3 to 4 months of therapy and stays in your system for a year as the new nail grows out.  Oral  antifungal medications do require blood work to make sure it is a safe drug for you.  A liver function panel will be performed prior to starting the medication and after the first month of treatment.  It is important to have the blood work performed to avoid any harmful side effects.  In general, this medication safe but blood work is required. 4. Laser Therapy.  This treatment is performed by applying a specialized laser to the affected nail plate.  This therapy is noninvasive, fast, and non-painful.  It is not covered by insurance and is therefore, out of pocket.  The results have been very good with a 80-95% cure rate.  [facility] is the only practice in the area to offer this therapy. 5. Permanent Nail Avulsion.  Removing the entire nail so that a new nail will not grow back.  Corns and Calluses A thickening of the skin layer (usually over bony areas, such as toe joints) is known as a corn. Two types of corns exist: hard corns and soft corns. Calluses are painless areas of skin thickening that are caused by repeated pressure or irritation. Corns tend to affect toe joints and the skin between the toes; whereas, a callus can appear on any part of the body (especially the hands, feet, or knees).  SYMPTOMS   Corn:  Presence of a small (1/8 to 3/8 inch [3 to 10 mm in diameter]), painful bump on the side or over the joint of a toe.  Hard corns are more common on the outer portion of the little (fifth) toe at the joint.  Soft corns are more common between bony bumps (prominences), usually between the fourth and fifth toes or between the second and third toes.  Callus:  A rough, thickened area of skin that appears after repeated pressure or irritation. CAUSES  The purpose of corns and calluses is to protect an area of skin from injury caused by repeated irritation (rubbing or squeezing). The presence of pressure causes the skin cells to grow at a faster rate than the cells of unaffected  areas. This leads to an overgrowth (corn or callus). As apposed to hard corns, soft corns tend to develop between toes, because there is more moisture. Soft corns are often the result of prolonged shoe wear, which leads to increased perspiration and moisture.  RISK INCREASES WITH:  Shoes that are too tight.  Occupations or sports that involve repetitive pressure on the hands (racquetball and baseball) or sudden stops on hard surfaces (track and tennis).  Sports that require the athlete to wear shoes, perspire, or wear clothing or protective gear that causes the production of heat and friction. PREVENTION  Properly fitted shoes and equipment.  Modify activities to prevent constant pressure on specific areas of skin.  If possible, wear padding over areas of skin that are exposed to repeated pressure or irritation.  Keep the area between the toes dry (with powder or by removing shoes often).  Relieve shoe pressure by stretching the areas of the shoe that cause the pressure and or use ointments to soften leather shoes. PROGNOSIS  Corns and calluses typically subside if the activity that causes them is eliminated. Recovery may take up to 3 weeks. Recurrence is likely even with treatment if the cause is not removed.  RELATED COMPLICATIONS  If one overcompensates in an attempt to avoid pain, he or she may experience pain in other areas due to the changes in body movements (mechanics). TREATMENT  The best way to treat corns and calluses is to remove the source of pressure. Corn and callus pads may be helpful in reducing pressure on the affected skin. For soft corns, try to keep the affected area dry. If you cannot find shoes that fit properly, a shoe repair shop may be able to alter your shoes to reduce pressure. Occasionally a cushion for the bottom of the foot (metatarsal bar) worn within the shoe may relieve pressure on corns or calluses of the foot. For calluses, you may be able to peel or rub  the thickened area with a pumice stone, sandstone, callus file, or with sandpaper to remove the callus; wetting the affected area may make this process more effective. Do not cut the corn or callus with a razor or knife. If the corn or callus must be removed, then a medically trained person should perform the procedure. After peeling away the upper layers of a corn once or twice a day, it may be recommended to apply a non-prescription 5% to 10% salicylic ointment and cover the area with a bandage. It very uncommon to have the bony bumps (at toe joints) surgically removed. MEDICATION   If pain medication is necessary, nonsteroidal anti-inflammatory medications, such as aspirin and ibuprofen, or other minor pain relievers, such as acetaminophen, are often recommended. Contact your caregiver immediately if any bleeding, stomach upset, or signs of an allergic reaction occur.  Topical salicylic ointments (5% to 10%) may be of benefit.  Prescription pain medications may  be given by a caregiver. Use only as directed and only as much as you need.  Soak the foot for 20 minutes, twice a day, in a gallon of warm water. This may help to soften corns and calluses. Care should be taken to thoroughly dry the foot, especially between the toes, after soaking. SEEK MEDICAL CARE IF:   Symptoms get worse or do not improve in 2 weeks despite treatment.  Any signs of infection develop, including redness, swelling, increased pain or tenderness, or increased warmth around the corn or callus.  New, unexplained symptoms develop (drugs used in treatment may produce side effects). Document Released: 11/28/2005 Document Revised: 02/20/2012 Document Reviewed: 03/12/2009 [facility] Patient Information 2014 [place], [place].

## 2013-12-13 NOTE — Progress Notes (Signed)
Subjective:     Patient ID: [name], female   DOB: 04-10-1961, 53 y.o.   MRN: [id number]  HPI patient presents stating that she has fungus on a number of her nails plantar calluses do become tender and pain in her feet in general   Review of Systems  All other systems reviewed and are negative.       Objective:   Physical Exam  Nursing note and vitals reviewed. Constitutional: She is oriented to person, place, and time.  Cardiovascular: Intact distal pulses.   Musculoskeletal: Normal range of motion.  Neurological: She is oriented to person, place, and time.  Skin: Skin is warm.   neurovascular status intact with muscle strength adequate and no equinus condition noted bilateral. Patient has plantar keratotic lesions that are painful when pressed so 13 right and inflammation in the forefoot noted with chronic pain on pressure. Nail disease times approximate 4-6 nails with the big toenails being worse     Assessment:     Plantar callus with inflammation and capsulitis noted. Mycotic nail infection with thickness and disease noted    Plan:     H&P performed and conditions discussed. Debridement of lesions accomplished today and orthotics to reduce stress on the feet were scanned at this time. Discussed laser therapy for nails and patient wants to have this done and is scheduled and formula 3 dispensed at the current time

## 2013-12-16 ENCOUNTER — Ambulatory Visit: Payer: Self-pay | Admitting: Pulmonary Disease

## 2013-12-20 ENCOUNTER — Encounter: Payer: Self-pay | Admitting: Cardiology

## 2013-12-20 ENCOUNTER — Ambulatory Visit (INDEPENDENT_AMBULATORY_CARE_PROVIDER_SITE_OTHER): Payer: BC Managed Care – PPO | Admitting: Cardiology

## 2013-12-20 VITALS — BP 128/72 | HR 58 | Ht 71.0 in | Wt 185.1 lb

## 2013-12-20 DIAGNOSIS — Q2111 Secundum atrial septal defect: Secondary | ICD-10-CM

## 2013-12-20 DIAGNOSIS — R0789 Other chest pain: Secondary | ICD-10-CM | POA: Insufficient documentation

## 2013-12-20 DIAGNOSIS — Q211 Atrial septal defect: Secondary | ICD-10-CM

## 2013-12-20 DIAGNOSIS — I8393 Asymptomatic varicose veins of bilateral lower extremities: Secondary | ICD-10-CM

## 2013-12-20 DIAGNOSIS — I8390 Asymptomatic varicose veins of unspecified lower extremity: Secondary | ICD-10-CM

## 2013-12-20 NOTE — Patient Instructions (Signed)
Your physician wants you to follow-up in 24 months DR HARDING.  You will receive a reminder letter in the mail two months in advance. If you don't receive a letter, please call our office to schedule the follow-up appointment.

## 2013-12-20 NOTE — Assessment & Plan Note (Deleted)
The symptoms she described were basically 2 occasions at rest left parasternal. When I examined her as a loop pinpoint a locations where the symptoms were, and with deep palpation could reproduce some of the discomfort she had to this event suggests that the symptoms are probably related to costochondritis type musculoskeletal pain. Most likely related to her exercise routines. She was quite reassured by this explanation of her symptoms. 

## 2013-12-20 NOTE — Assessment & Plan Note (Signed)
The symptoms she described were basically 2 occasions at rest left parasternal. When I examined her as a loop pinpoint a locations where the symptoms were, and with deep palpation could reproduce some of the discomfort she had to this event suggests that the symptoms are probably related to costochondritis type musculoskeletal pain. Most likely related to her exercise routines. She was quite reassured by this explanation of her symptoms.

## 2013-12-20 NOTE — Progress Notes (Signed)
PATIENT: [name] MRN: [id number]  DOB: 02-11-1961   DOV:12/20/2013 PCP: Noralee Space, MD  Clinic Note: Chief Complaint  Patient presents with  . Annual Exam    C/o 2 episodes of burning, dull aching chest pain since Thanksgiving-no radiation. Has an atrial-septal defect, wants to know how the "patch" in her heart is doing.    HPI: [name] is a 53 y.o.  female with a PMH below who presents today for what is essentially a 2 year followup. As you may recall she is a pleasant 53 year old woman who I last saw in January of 2012 after being followed by Dr. Rex Kras back in the 1990s. She has a history of ASD repair by Dr. Servando Snare in either 1989 or 1990.  She also has a history of varicose vein with reflux status post laser surgery by Dr. Donnetta Hutching in December 2008. When I saw her in 2012, we checked an echocardiogram, but did not check a stress test as her symptoms were diagnosed as GI related.  Interval History: She presents today partially because she wanted to followup with her history of the ASD repair and also because of some episodes of chest discomfort she's had since Thanksgiving. She really describe 2 major episodes of left-sided parasternal chest discomfort that was somewhat sharp in nature. It occurred while sitting at her desk. Never occurred with exertion. It was worse with deep inspiration. She is otherwise not had any more symptoms that would be associated with this or similar to it. She's not had PND, orthopnea or edema. No more episodes of chest discomfort with rest her surgeon. No rapid or irregular heartbeats. No lightheadedness, dizziness, weakness or syncope/near syncope. No TIA or amaurosis fugax symptoms. No melena, hematochezia or hematuria. No claudication.  Past Medical History  Diagnosis Date  . Loss of appetite   . Allergic rhinitis   . Back pain   . History of Ostium Secundum Atrial Septal Defect 1989    Status post repair in 1989/1990  . Varicose veins   .  IBS (irritable bowel syndrome)   . 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 Endometriosis   . Hx of migraines   . Anxiety   . Onychomycosis   . Fatigue   . Contact lens/glasses fitting   . Incontinence   . Hiatal hernia     Prior Cardiac Evaluation and Past Surgical History: Past Surgical History  Procedure Laterality Date  . Asd repair, secundum  1990    Dr. Servando Snare  . Hysterectomy for severe endometriosis    . Right leg fracture with surgery      done with ankle surgery  . Varicose laser vein surgery  2009  . Right ankle surgery  2010  . Abdominal hysterectomy  2004  . Transthoracic echocardiogram   January 2012    Normal LV size and function, EF greater than 55%. Mild LA dilation. No intra-atrial shunt with bubble study. Normal pulmonary pressures. Only trace to mild mitral and tricuspid regurgitation.    Allergies  Allergen Reactions  . Chlordiazepoxide-Clidinium     "loopy"  . Codeine     REACTION: nausea,vomitting and dizziness  . Penicillins     REACTION: hives ---taken as a child    Current Outpatient Prescriptions  Medication Sig Dispense Refill  . B Complex-Biotin-FA (B COMPLEX 100 TR PO) Take 1 tablet by mouth daily.        . Calcium Carb-Cholecalciferol (CALTRATE 600+D) 600-800 MG-UNIT TABS Take 1 tablet by mouth 2 (  two) times daily.      . Calcium Glycerophosphate (PRELIEF) 340 (65-50) MG (CA-P) TABS Take 1 tablet by mouth as needed.      . cetirizine (ZYRTEC) 10 MG tablet Take 10 mg by mouth daily.        . cyclobenzaprine (FLEXERIL) 10 MG tablet Take 10 mg by mouth as needed.      [name] dexlansoprazole (DEXILANT) 60 MG capsule Take 1 capsule (60 mg total) by mouth daily.  30 capsule  11  . fluticasone (FLONASE) 50 MCG/ACT nasal spray Place 1 spray into both nostrils as needed.      . magnesium oxide (MAG-OX) 400 MG tablet Take 400 mg by mouth daily.      . Multiple Vitamin (MULTIVITAMINS PO) Take 1 tablet by mouth daily.        . Omega-3 Fatty Acids (FISH OIL) 1000  MG CAPS Take 1 capsule by mouth daily.        . polyethylene glycol powder (MIRALAX) powder Take 17 g by mouth at bedtime.       . Probiotic Product (PROBIOTIC DAILY PO) Take by mouth.       No current facility-administered medications for this visit.   History   Social History Narrative   She has been married for 33 years. They have 2 children and at least one grandchild. She lives with her husband. She does all her activities of daily living. She works as a Midwife for [employer].     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 She does other days of the gym and she is able to at least 60 minutes a day 5 days a week.   She is a former smoker who quit in 1990. This was after smoking a pack a day for 10 years. She drinks social alcohol on occasion.   ROS: A comprehensive Review of Systems - Negative if not noted above in history of present illness  PHYSICAL EXAM BP 128/72  Pulse 58  Ht 5\' 11"  (1.803 m)  Wt 185 lb 1.6 oz (83.961 kg)  BMI 25.83 kg/m2 General appearance: alert, cooperative, appears stated age, no distress and Healthy-appearing. Well-nourished and well-groomed. Pleasant mood and affect. Neck: no adenopathy, no carotid bruit, no JVD, supple, symmetrical, trachea midline and HEENT: Door/AT, EOMI, MMM, anicteric sclera Lungs: clear to auscultation bilaterally, normal percussion bilaterally and Nonlabored, good air movement Heart: regular rate and rhythm, S1, S2 normal, no murmur, click, rub or gallop and normal apical impulse Abdomen: soft, non-tender; bowel sounds normal; no masses,  no organomegaly Extremities: extremities normal, atraumatic, no cyanosis or edema Pulses: 2+ and symmetric Neurologic: Alert and oriented X 3, normal strength and tone. Normal symmetric reflexes. Normal coordination and gait  VPX:[id number] today: Yes Rate:58 , Rhythm: Sinus Bradycardia - Sinus  Arrhythmia; Otherwise normal.  Recent Labs: none  ASSESSMENT / PLAN: ATRIAL SEPTAL DEFECT - Status Post Repair She was concerned about status of her "patch repair ". I assured her that at this stage again, her Gore-Tex patch as long since endothelialized, and is basically just a part of her.  Her echocardiogram in 2012 confirmed lack of persistent ASD with a negative bubble study, in fact the reader tonight and a mallet any abnormalities in the atrial septum.  VARICOSE VEINS, LOWER EXTREMITIES No current complaints of edema, or venous stasis symptoms.  Chest pain, atypical The symptoms  she described were basically 2 occasions at rest left parasternal. When I examined her as a loop pinpoint a locations where the symptoms were, and with deep palpation could reproduce some of the discomfort she had to this event suggests that the symptoms are probably related to costochondritis type musculoskeletal pain. Most likely related to her exercise routines. She was quite reassured by this explanation of her symptoms.    Orders Placed This Encounter  Procedures  . EKG 12-Lead    Followup: 2 yrs  January Bergthold W. Ellyn Hack, M.D., M.S. [facility] [address] [place], [place]  [id number]  [phone] Pager # [phone]

## 2013-12-20 NOTE — Assessment & Plan Note (Signed)
She was concerned about status of her "patch repair ". I assured her that at this stage again, her Gore-Tex patch as long since endothelialized, and is basically just a part of her.  Her echocardiogram in 2012 confirmed lack of persistent ASD with a negative bubble study, in fact the reader tonight and a mallet any abnormalities in the atrial septum.

## 2013-12-20 NOTE — Assessment & Plan Note (Signed)
No current complaints of edema, or venous stasis symptoms.

## 2013-12-30 ENCOUNTER — Encounter: Payer: Self-pay | Admitting: Podiatry

## 2013-12-30 ENCOUNTER — Ambulatory Visit: Payer: BC Managed Care – PPO | Admitting: Podiatry

## 2013-12-30 VITALS — BP 109/62 | HR 62 | Resp 12

## 2013-12-30 DIAGNOSIS — B351 Tinea unguium: Secondary | ICD-10-CM

## 2013-12-30 NOTE — Progress Notes (Signed)
Subjective:     Patient ID: [name], female   DOB: Apr 05, 1961, 53 y.o.   MRN: [id number]  HPI patient presents for laser surgery x5 at this time   Review of Systems     Objective:   Physical Exam Neurovascular status intact with the hallux nails thickened second both feet and fourth right    Assessment:     Mycotic nails with trauma also as part of his shoe both feet    Plan:     Laser of 5 nails approximate 1500 pulses tolerated well

## 2014-01-13 LAB — HM MAMMOGRAPHY

## 2014-02-10 ENCOUNTER — Ambulatory Visit (INDEPENDENT_AMBULATORY_CARE_PROVIDER_SITE_OTHER): Payer: BC Managed Care – PPO | Admitting: Podiatry

## 2014-02-10 ENCOUNTER — Encounter: Payer: Self-pay | Admitting: Podiatry

## 2014-02-10 VITALS — BP 113/94 | HR 60 | Resp 12

## 2014-02-10 DIAGNOSIS — B351 Tinea unguium: Secondary | ICD-10-CM

## 2014-02-11 NOTE — Progress Notes (Signed)
Subjective:     Patient ID: [name], female   DOB: October 23, 1961, 53 y.o.   MRN: [id number]  HPI patient presents for laser x5 stating my nails are getting better   Review of Systems     Objective:   Physical Exam The nailbeds look improved with the distal yellow discoloration of approximately 1/3 of the nailbeds 12 left and 1-3 right    Assessment:     Improvement of mycosis both feet    Plan:     Laser accomplished today with no problems and continue topical and reappoint 4 months

## 2014-02-14 ENCOUNTER — Telehealth: Payer: Self-pay | Admitting: Pulmonary Disease

## 2014-02-14 NOTE — Telephone Encounter (Signed)
Called and spoke with pt and she is requesting that appt be scheduled with TP and then pt will set up with new primary care doctor.  This has been done and nothing further is needed.

## 2014-02-19 ENCOUNTER — Encounter: Payer: Self-pay | Admitting: Podiatry

## 2014-02-19 ENCOUNTER — Ambulatory Visit: Payer: Self-pay | Admitting: Pulmonary Disease

## 2014-02-19 ENCOUNTER — Ambulatory Visit: Payer: BC Managed Care – PPO | Admitting: Podiatry

## 2014-02-19 VITALS — BP 109/62 | HR 62 | Resp 16

## 2014-02-19 DIAGNOSIS — M779 Enthesopathy, unspecified: Secondary | ICD-10-CM

## 2014-02-19 DIAGNOSIS — M7660 Achilles tendinitis, unspecified leg: Secondary | ICD-10-CM

## 2014-02-19 DIAGNOSIS — M21549 Acquired clubfoot, unspecified foot: Secondary | ICD-10-CM

## 2014-02-19 NOTE — Patient Instructions (Signed)

## 2014-02-20 NOTE — Progress Notes (Signed)
Subjective:     Patient ID: [name], female   DOB: 18-Jul-1961, 53 y.o.   MRN: [id number]  HPI patient presents stating I'm here to get my orthotics and my feet are feeling painful   Review of Systems     Objective:   Physical Exam Neurovascular status unchanged with discomfort plantar feet chronic inflammation    Assessment:     Tendinitis both feet    Plan:     Dispensed orthotics with instructions on usage and begin physical therapy

## 2014-02-24 ENCOUNTER — Ambulatory Visit (INDEPENDENT_AMBULATORY_CARE_PROVIDER_SITE_OTHER): Payer: BC Managed Care – PPO | Admitting: Adult Health

## 2014-02-24 ENCOUNTER — Ambulatory Visit: Payer: BC Managed Care – PPO | Admitting: Podiatry

## 2014-02-24 ENCOUNTER — Encounter: Payer: Self-pay | Admitting: Adult Health

## 2014-02-24 VITALS — BP 100/56 | HR 56 | Temp 98.7°F | Ht 71.0 in | Wt 172.4 lb

## 2014-02-24 DIAGNOSIS — K219 Gastro-esophageal reflux disease without esophagitis: Secondary | ICD-10-CM

## 2014-02-24 DIAGNOSIS — J309 Allergic rhinitis, unspecified: Secondary | ICD-10-CM

## 2014-02-24 NOTE — Patient Instructions (Signed)
Saline nasal rinses As needed   Refresh eye drops As needed   May add Chlortrimeton 4mg  1-2 At bedtime  As needed  Drainage  Continue on Flonase and Zyrtec for allergy symptoms.  Labs as discussed  Continue great job with low fat cholesterol diet and exercise.  Dr. Lenna Gilford is retiring and we will need to set you up with a new Primary Care provider.  Please let us know if you would like [place] to assist with this referral.

## 2014-02-24 NOTE — Progress Notes (Signed)
Subjective:    Patient ID: [name], female    DOB: 1961-10-09, 53 y.o.   MRN: [id number]  HPI    Review of Systems     Objective:   Physical Exam        Assessment & Plan:    Subjective:    Patient ID: [name], female    DOB: 01/30/1961, 53 y.o.   MRN: [id number]  HPI  53y/o WF  she has multiple medical problems including hx ASD repair 1992, VV & VI, HH/ GERD, IBS improved on gluten free diet, hx LBP, hx migraines, anxiety...   02/24/2014  Follow up  Returns for follow up . Reports doing well.  Does complain of some allergy-type symptoms with puffy/itchy/watery/red eyes x2 months.  Taking Zytec-D, Flonase, eye drops with some relief.   Would like labs done today as she is fasting.  Doing well on Dexilant , no GERD flare/sx.  Very active , exercises most days.  No chest pain, orthopnea, edema , n/v, wt loss, or dyspnea.          Problem List:   PHYSICAL EXAMINATION (ICD-V70.0) - she is up-to-date on needed screening procedures... GYN= DrMezer every Aug w/ PAP, Mammogram, & she's had one BMD (due to mother's hx)- told normal. 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 she had employer sponsored PFT's at [place] 09/21/09 showing FVC= 4.86 (109%), FEV1= 3.79 (107%), %1sec= 78, & mid-flows= 83% predicted... these are normal numbers but the [facility] PA suggested a medical eval (?why)>  pt has no hx resp problems other than some mild allergies, never smoked, etc... she is cleared to wear the respirator & letter written.  ALLERGY (ICD-995.3) - prev on shots per DrESL, off for some time now and doing satis... takes ZYRTEK 10mg /d Prn...  Hx of ATRIAL SEPTAL DEFECT (ICD-745.5) - S/P ASD repair 1992 by DrGearhardt... doing well without CP, palpit, dizzy, syncope, dyspnea etc... she [facility] water aerob[employer], and works out w/ a Physiological scientist... ~  1/12: pt notes occas  palpit & atyp CP> she would like cardiac eval & was set up to see DrLittle... ~  1/12: Cardiac eval DrHarding,[facility]> hx ASD repair by DrGearhardt 1989, occas fluttering episodes, no CP/ angina; he reports doing an EKG= SBrady at 48, NSSTTWA, NAD;  2DEcho reported to show norm LV size & function w/ EF>55%, mild LAdil w/ mild MR/ TR, triv effusion, othrew neg;  Nuclear Stress test showed breast attenuation, no ischemia, no infarct, exercise capacity >45mets...  VARICOSE VEINS, LOWER EXTREMITIES (ICD-454.9) - full eval by DrEarly... ~  11/08 LE Venous Reflux Exam showed right GSV reflux... ~  s/p laser surg 12/08 w/ laser ablation right GSV by DrEarly & improved.  ?of HH/ GERD >> GI eval by DrPatterson   IRRITABLE BOWEL SYNDROME (ICD-564.1) - hx severe constipation, redundant colon & hx cecal volvulus... she's been taking MIRALAX & Metamucil w/ control of symptoms... last saw DrPatterson 9/09 & note reviewed- he checked celiac dis antibody test (all neg)... she states all symptoms resolved on Gluten free diet. ~  normal colonoscopy 5/04 by DrPatterson.   Hx of INTESTINAL VOLVULUS, LARGE BOWEL (ICD-560.2) - cecal volvulus 5/07 on CT in ER, resolved after BE without surgery.  Hx of ENDOMETRIOSIS (ICD-617.9) - s/p hysterectomy for severe endometriosis problem.  BACK PAIN, LUMBAR (ICD-724.2) - eval by DrGioffre and DrRamos in 2006. ~  She tells me that DrRendall diagnosed 3 deteriorating discs & rec surgery; she went to [facility] who notes sl scoliosis & keeps her in-line w/ adjustments Q2wks that help...  LEG PAIN (ICD-729.5) - eval by DrEarly and DrRendall... right ankle was fractured while hiking in 2008> they didn't find it for 6 months- finally had right ankle surgery w/ pinning 2/09 by DrRendall (MRI w/ "chipped bone" and DrRendall did arthroscopic surg right ankle 2/09)...  MIGRAINES, HX OF (ICD-V13.8) - on RELPAX 40mg  prn per DrFreeman- she asked me to refill this prn med for  her...  ANXIETY (ICD-300.00) - on ALPRAZOLAM 0.5mg  Prn >> we discussed taking 1/4 tab by mouth Bid-Tid regularly.   Past Surgical History  Procedure Laterality Date  . Asd repair, secundum  1990    Dr. Servando Snare  . Hysterectomy for severe endometriosis    . Right leg fracture with surgery      done with ankle surgery  . Varicose laser vein surgery  2009  . Right ankle surgery  2010  . Abdominal hysterectomy  2004  . Transthoracic echocardiogram   January 2012    Normal LV size and function, EF greater than 55%. Mild LA dilation. No intra-atrial shunt with bubble study. Normal pulmonary pressures. Only trace to mild mitral and tricuspid regurgitation.   Allergies  Allergen Reactions  . Chlordiazepoxide-Clidinium     "loopy"  . Codeine     REACTION: nausea,vomitting and dizziness  . Penicillins     REACTION: hives ---taken as a child    Current Medications, Allergies, Past Medical History, Past Surgical History, Family History, and Social History were reviewed in [facility] record.     Review of Systems        Constitutional:   No  weight loss, night sweats,  Fevers, chills, fatigue, or  lassitude.  HEENT:   No headaches,  Difficulty swallowing,  Tooth/dental problems, or  Sore throat,                No sneezing, itching, ear ache,  +nasal congestion, post nasal drip,   CV:  No chest pain,  Orthopnea, PND, swelling in lower extremities, anasarca, dizziness, palpitations, syncope.   GI  No heartburn, indigestion, abdominal pain, nausea, vomiting, diarrhea, change in bowel habits, loss of appetite, bloody stools.   Resp: No shortness of breath with exertion or at rest.  ,  No coughing up of blood.   No wheezing.  No chest wall deformity  Skin: no rash or lesions.  GU: no dysuria, change in color of urine, no urgency or frequency.  No flank pain, no hematuria   MS:    No decreased range of motion.     Psych:  No change in mood or affect. No  depression or anxiety.  No memory loss.       Objective:   Physical Exam     WD, WN, 53 y/o WF in NAD... GENERAL:  Alert & oriented; pleasant & cooperative...  HEENT:  [place]/AT,  EACs-bilateral wax impaction , TMs-wnl, NOSE-clear drainage THROAT-clear & wnl. NECK:  Supple w/ fair ROM; no JVD; normal carotid impulses w/o bruits; no thyromegaly or nodules palpated; no lymphadenopathy. CHEST:  Clear to P & A; without wheezes/ rales/ or rhonchi... s/p ASD repair 1992. HEART:  Regular Rhythm; gr 1/6 SEM without rubs or gallops... ABDOMEN:  Soft & nontender; normal bowel sounds; no organomegaly or masses detected. EXT: without deformities or arthritic changes;   ven insuffic, no  edema...  NEURO:   ; sensory testing normal; gait normal & balance OK. DERM:  No lesions noted; no rash etc...    Assessment & Plan:

## 2014-02-26 ENCOUNTER — Telehealth: Payer: Self-pay | Admitting: Pulmonary Disease

## 2014-02-26 DIAGNOSIS — K219 Gastro-esophageal reflux disease without esophagitis: Secondary | ICD-10-CM

## 2014-02-26 DIAGNOSIS — F411 Generalized anxiety disorder: Secondary | ICD-10-CM

## 2014-02-26 DIAGNOSIS — Z0000 Encounter for general adult medical examination without abnormal findings: Secondary | ICD-10-CM

## 2014-02-26 NOTE — Telephone Encounter (Signed)
Pt returning call, csn be reached @ [phone].[name]

## 2014-02-26 NOTE — Telephone Encounter (Signed)
I spoke with the pt and she states that she checked with her insurance and she can now come to our lab and get labs done so she wanted to know what she needed to do. She did not have the order with her at this time to let me know what labs were ordered. I advised her to bring the order with her to the lab and they will draw what is ordered. [name] states she will notify the lab as well. Prospect Bing, CMA

## 2014-02-26 NOTE — Telephone Encounter (Signed)
lmomtcb x1 

## 2014-02-27 NOTE — Assessment & Plan Note (Signed)
Doing well   Plan  Labs as discussed  GERD diet  No changes in rx

## 2014-02-27 NOTE — Assessment & Plan Note (Signed)
Flare   Plan  Saline nasal rinses As needed   Refresh eye drops As needed   May add Chlortrimeton 4mg  1-2 At bedtime  As needed  Drainage  Continue on Flonase and Zyrtec for allergy symptoms.

## 2014-02-28 ENCOUNTER — Other Ambulatory Visit (INDEPENDENT_AMBULATORY_CARE_PROVIDER_SITE_OTHER): Payer: BC Managed Care – PPO

## 2014-02-28 DIAGNOSIS — Z0000 Encounter for general adult medical examination without abnormal findings: Secondary | ICD-10-CM

## 2014-02-28 DIAGNOSIS — K219 Gastro-esophageal reflux disease without esophagitis: Secondary | ICD-10-CM

## 2014-02-28 DIAGNOSIS — F411 Generalized anxiety disorder: Secondary | ICD-10-CM

## 2014-02-28 LAB — BASIC METABOLIC PANEL
BUN: 13 mg/dL (ref 6–23)
CO2: 32 mEq/L (ref 19–32)
Calcium: 9.4 mg/dL (ref 8.4–10.5)
Chloride: 104 mEq/L (ref 96–112)
Creatinine, Ser: 0.8 mg/dL (ref 0.4–1.2)
GFR: 79.35 mL/min (ref >60.00)
Glucose, Bld: 92 mg/dL (ref 70–99)
POTASSIUM: 3.9 meq/L (ref 3.5–5.1)
Sodium: 141 mEq/L (ref 135–145)

## 2014-02-28 LAB — LIPID PANEL
Cholesterol: 144 mg/dL (ref 0–200)
HDL: 40.5 mg/dL (ref >39.00)
LDL Cholesterol: 96 mg/dL (ref 0–99)
Total CHOL/HDL Ratio: 4
Triglycerides: 40 mg/dL (ref 0.0–149.0)
VLDL: 8 mg/dL (ref 0.0–40.0)

## 2014-02-28 LAB — CBC WITH DIFFERENTIAL/PLATELET
Basophils Absolute: 0 10*3/uL (ref 0.0–0.1)
Basophils Relative: 0.7 % (ref 0.0–3.0)
EOS ABS: 0.2 10*3/uL (ref 0.0–0.7)
Eosinophils Relative: 4.2 % (ref 0.0–5.0)
HCT: 43.9 % (ref 36.0–46.0)
Hemoglobin: 14.8 g/dL (ref 12.0–15.0)
Lymphocytes Relative: 33.6 % (ref 12.0–46.0)
Lymphs Abs: 1.8 10*3/uL (ref 0.7–4.0)
MCHC: 33.6 g/dL (ref 30.0–36.0)
MCV: 90.9 fl (ref 78.0–100.0)
MONO ABS: 0.5 10*3/uL (ref 0.1–1.0)
Monocytes Relative: 9.3 % (ref 3.0–12.0)
NEUTROS PCT: 52.2 % (ref 43.0–77.0)
Neutro Abs: 2.8 10*3/uL (ref 1.4–7.7)
Platelets: 310 10*3/uL (ref 150.0–400.0)
RBC: 4.83 Mil/uL (ref 3.87–5.11)
RDW: 12.5 % (ref 11.5–14.6)
WBC: 5.3 10*3/uL (ref 4.5–10.5)

## 2014-02-28 LAB — URINALYSIS, ROUTINE W REFLEX MICROSCOPIC
Bilirubin Urine: NEGATIVE
Hgb urine dipstick: NEGATIVE
Ketones, ur: NEGATIVE
NITRITE: NEGATIVE
PH: 7.5 (ref 5.0–8.0)
SPECIFIC GRAVITY, URINE: 1.015 (ref 1.000–1.030)
TOTAL PROTEIN, URINE-UPE24: NEGATIVE
Urine Glucose: NEGATIVE
Urobilinogen, UA: 0.2 (ref 0.0–1.0)

## 2014-02-28 LAB — TSH: TSH: 2.13 u[IU]/mL (ref 0.35–5.50)

## 2014-02-28 LAB — HEPATIC FUNCTION PANEL
ALBUMIN: 4.1 g/dL (ref 3.5–5.2)
ALT: 17 U/L (ref 0–35)
AST: 22 U/L (ref 0–37)
Alkaline Phosphatase: 55 U/L (ref 39–117)
Bilirubin, Direct: 0.1 mg/dL (ref 0.0–0.3)
Total Bilirubin: 1.2 mg/dL (ref 0.3–1.2)
Total Protein: 6.7 g/dL (ref 6.0–8.3)

## 2014-02-28 NOTE — Addendum Note (Signed)
Addended by: Lorane Gell on: 02/28/2014 08:53 AM   Modules accepted: Orders

## 2014-03-01 LAB — VITAMIN D 25 HYDROXY (VIT D DEFICIENCY, FRACTURES): Vit D, 25-Hydroxy: 68 ng/mL (ref 30–89)

## 2014-03-06 NOTE — Progress Notes (Signed)
Quick Note:  Patient returned call. Advised of lab results / recs as stated by TP. Pt verbalized understanding and denied any questions - pt denies any urinary symptoms at this time and will call if any develop. ______

## 2014-03-06 NOTE — Progress Notes (Signed)
Quick Note:  LMOM TCB x1. ______ 

## 2014-03-24 ENCOUNTER — Encounter: Payer: Self-pay | Admitting: Podiatry

## 2014-05-02 ENCOUNTER — Other Ambulatory Visit: Payer: Self-pay

## 2014-05-15 ENCOUNTER — Ambulatory Visit (INDEPENDENT_AMBULATORY_CARE_PROVIDER_SITE_OTHER): Payer: BC Managed Care – PPO | Admitting: Internal Medicine

## 2014-05-15 ENCOUNTER — Encounter: Payer: Self-pay | Admitting: Internal Medicine

## 2014-05-15 ENCOUNTER — Ambulatory Visit: Payer: Self-pay | Admitting: Internal Medicine

## 2014-05-15 VITALS — BP 90/64 | HR 68 | Ht 71.0 in | Wt 170.0 lb

## 2014-05-15 DIAGNOSIS — Z1211 Encounter for screening for malignant neoplasm of colon: Secondary | ICD-10-CM

## 2014-05-15 DIAGNOSIS — K219 Gastro-esophageal reflux disease without esophagitis: Secondary | ICD-10-CM

## 2014-05-15 MED ORDER — DEXLANSOPRAZOLE 30 MG PO CPDR: 30.0000 mg | DELAYED_RELEASE_CAPSULE | Freq: Every day | ORAL | Status: DC

## 2014-05-15 NOTE — Progress Notes (Signed)
Subjective:    Patient ID: AMIJAH TIMOTHY, female    DOB: 1961/02/23, 53 y.o.   MRN: [id number]  HPI [name] is a 53 year old female please followed by Dr. Sharlett Iles for GERD who is seen in followup. She also has a history of atrial septal defect status post repair, endometriosis, migraines, and back pain. She is here alone today. She was last seen by Dr. Sharlett Iles on 06/26/2013. Her last upper endoscopy was in March 2012 and she had a hiatal hernia and mild erosive esophagitis. She has been on Dexilant 60 mg daily. She reports this medication works very well for her heartburn. She is also using an over-the-counter product called Prelief 1-2 capsules before each meal. This medication consists of calcium glycerophosphate and mag stearate.  She also feels that this helps prevent heartburn and would like to decrease Dexilant possible. She denies nausea or vomiting. No dysphagia. No abdominal pain normal bowel movements. She did try a whole foods smoothie diet and this seemed to help with constipation. He does have a history of mild constipation. No blood in her stools or melena.  She has a bone mineral density test scheduled for later this year.  Dr. Sharlett Iles had a previous order manometry which was unsuccessful, but she is currently denying swallowing difficulty, see above  The patient did recently have nasal septal surgery  Last colonoscopy performed March 2012 excellent prep no polyps. Repeat recommended March 2022. No history of polyps  Review of Systems As per history of present illness, otherwise negative  Current Medications, Allergies, Past Medical History, Past Surgical History, Family History and Social History were reviewed in [facility] record.     Objective:   Physical Exam BP 90/64  Pulse 68  Ht 5\' 11"  (1.803 m)  Wt 170 lb (77.111 kg)  BMI 23.72 kg/m2 Constitutional: Well-developed and well-nourished. No distress. HEENT: Normocephalic with  small bruises over maxillary sinuses bilaterally. Oropharynx is clear and moist. No oropharyngeal exudate. Conjunctivae are normal.  No scleral icterus. Neck: Neck supple. Trachea midline. Cardiovascular: Normal rate, regular rhythm and intact distal pulses. No M/R/G Pulmonary/chest: Effort normal and breath sounds normal. No wheezing, rales or rhonchi. Abdominal: Soft, nontender, nondistended. Bowel sounds active throughout.  Extremities: no clubbing, cyanosis, or edema Lymphadenopathy: No cervical adenopathy noted. Neurological: Alert and oriented to person place and time. Skin: Skin is warm and dry. No rashes noted. Psychiatric: Normal mood and affect. Behavior is normal.  CBC    Component Value Date/Time   WBC 5.3 02/28/2014 0911   RBC 4.83 02/28/2014 0911   HGB 14.8 02/28/2014 0911   HCT 43.9 02/28/2014 0911   PLT 310.0 02/28/2014 0911   MCV 90.9 02/28/2014 0911   MCHC 33.6 02/28/2014 0911   RDW 12.5 02/28/2014 0911   LYMPHSABS 1.8 02/28/2014 0911   MONOABS 0.5 02/28/2014 0911   EOSABS 0.2 02/28/2014 0911   BASOSABS 0.0 02/28/2014 0911    CMP     Component Value Date/Time   NA 141 02/28/2014 0911   K 3.9 02/28/2014 0911   CL 104 02/28/2014 0911   CO2 32 02/28/2014 0911   GLUCOSE 92 02/28/2014 0911   BUN 13 02/28/2014 0911   CREATININE 0.8 02/28/2014 0911   CALCIUM 9.4 02/28/2014 0911   PROT 6.7 02/28/2014 0911   ALBUMIN 4.1 02/28/2014 0911   AST 22 02/28/2014 0911   ALT 17 02/28/2014 0911   ALKPHOS 55 02/28/2014 0911   BILITOT 1.2 02/28/2014 0911   GFRNONAA 80.88  04/14/2009 0808   GFRAA 115 10/29/2007 1126        Assessment & Plan:   53 year old female please followed by Dr. Sharlett Iles for GERD who is seen in followup.  1.  GERD -- well-controlled without alarm symptoms. We will try to decrease Dexilant at 30 mg daily. She is reminded to take this 30 minutes before her first meal of the day. She can continue OTC Prelief as directed. GERD diet. Return in one year for this issue  2.   CRC screening -- up-to-date next screening colonoscopy recommended March 2022.

## 2014-05-15 NOTE — Patient Instructions (Signed)
Decrease Dexilant to 30 mg daily, a new prescription has been sent to your pharmacy.  Prelief is ok to take as you are.  Diet for Gastroesophageal Reflux Disease, Adult Reflux (acid reflux) is when acid from your stomach flows up into the esophagus. When acid comes in contact with the esophagus, the acid causes irritation and soreness (inflammation) in the esophagus. When reflux happens often or so severely that it causes damage to the esophagus, it is called gastroesophageal reflux disease (GERD). Nutrition therapy can help ease the discomfort of GERD. FOODS OR DRINKS TO AVOID OR LIMIT  Smoking or chewing tobacco. Nicotine is one of the most potent stimulants to acid production in the gastrointestinal tract.  Caffeinated and decaffeinated coffee and black tea.  Regular or low-calorie carbonated beverages or energy drinks (caffeine-free carbonated beverages are allowed).   Strong spices, such as black pepper, white pepper, red pepper, cayenne, curry powder, and chili powder.  Peppermint or spearmint.  Chocolate.  High-fat foods, including meats and fried foods. Extra added fats including oils, butter, salad dressings, and nuts. Limit these to less than 8 tsp per day.  Fruits and vegetables if they are not tolerated, such as citrus fruits or tomatoes.  Alcohol.  Any food that seems to aggravate your condition. If you have questions regarding your diet, call your caregiver or a registered dietitian. OTHER THINGS THAT MAY HELP GERD INCLUDE:   Eating your meals slowly, in a relaxed setting.  Eating 5 to 6 small meals per day instead of 3 large meals.  Eliminating food for a period of time if it causes distress.  Not lying down until 3 hours after eating a meal.  Keeping the head of your bed raised 6 to 9 inches (15 to 23 cm) by using a foam wedge or blocks under the legs of the bed. Lying flat may make symptoms worse.  Being physically active. Weight loss may be helpful in  reducing reflux in overweight or obese adults.  Wear loose fitting clothing EXAMPLE MEAL PLAN This meal plan is approximately 2,000 calories based on [email or web address] meal planning guidelines. Breakfast   cup cooked oatmeal.  1 cup strawberries.  1 cup low-fat milk.  1 oz almonds. Snack  1 cup cucumber slices.  6 oz yogurt (made from low-fat or fat-free milk). Lunch  2 slice whole-wheat bread.  2 oz sliced [place].  2 tsp mayonnaise.  1 cup blueberries.  1 cup snap peas. Snack  6 whole-wheat crackers.  1 oz string cheese. Dinner   cup brown rice.  1 cup mixed veggies.  1 tsp olive oil.  3 oz grilled fish. Document Released: 11/28/2005 Document Revised: 02/20/2012 Document Reviewed: 10/14/2011 [facility] Patient Information 2014 [place], [place].

## 2014-06-30 ENCOUNTER — Ambulatory Visit: Payer: BC Managed Care – PPO | Admitting: Podiatry

## 2014-07-02 ENCOUNTER — Ambulatory Visit: Payer: BC Managed Care – PPO | Admitting: Podiatry

## 2014-07-04 ENCOUNTER — Telehealth: Payer: Self-pay | Admitting: Pulmonary Disease

## 2014-07-04 NOTE — Telephone Encounter (Signed)
Called and spoke to pt. Pt requesting the last tdap vaccine from her husband and herself. Records given verbally. Pt also requesting information about a new PCP. All locations of [place] [facility] given to pt. Pt stated she will talk with her husband and they will discuss further. Nothing further needed.

## 2014-07-16 ENCOUNTER — Ambulatory Visit (INDEPENDENT_AMBULATORY_CARE_PROVIDER_SITE_OTHER): Payer: Self-pay | Admitting: Podiatry

## 2014-07-16 DIAGNOSIS — B351 Tinea unguium: Secondary | ICD-10-CM

## 2014-07-16 NOTE — Progress Notes (Signed)
Subjective:     Patient ID: [name], female   DOB: Apr 29, 1961, 53 y.o.   MRN: [id number]  HPI patient presents with nail disease of nails 1-3 of both feet  Review of Systems     Objective:   Physical Exam Neurovascular status unchanged with improved color of the nailbed with some fungal element left on the big toenails of both feet    Assessment:     Improving with laser treatment    Plan:     Laser #3 accomplished of nailbeds 12 in 3 of both feet tolerated well and 1500 pulses approximate

## 2014-08-01 ENCOUNTER — Encounter: Payer: Self-pay | Admitting: Gastroenterology

## 2014-09-12 ENCOUNTER — Ambulatory Visit (INDEPENDENT_AMBULATORY_CARE_PROVIDER_SITE_OTHER): Payer: PRIVATE HEALTH INSURANCE | Admitting: Family Medicine

## 2014-09-12 ENCOUNTER — Encounter: Payer: Self-pay | Admitting: Family Medicine

## 2014-09-12 VITALS — BP 114/70 | HR 67 | Temp 98.4°F | Wt 175.3 lb

## 2014-09-12 DIAGNOSIS — J0111 Acute recurrent frontal sinusitis: Secondary | ICD-10-CM

## 2014-09-12 DIAGNOSIS — G4700 Insomnia, unspecified: Secondary | ICD-10-CM

## 2014-09-12 DIAGNOSIS — J0100 Acute maxillary sinusitis, unspecified: Secondary | ICD-10-CM

## 2014-09-12 MED ORDER — CLARITHROMYCIN ER 500 MG PO TB24: 1000.0000 mg | ORAL_TABLET | Freq: Every day | ORAL | Status: AC

## 2014-09-12 MED ORDER — ALPRAZOLAM 0.5 MG PO TABS: ORAL_TABLET | ORAL | Status: DC

## 2014-09-12 NOTE — Progress Notes (Signed)
Pre visit review using our clinic review tool, if applicable. No additional management support is needed unless otherwise documented below in the visit note. 

## 2014-09-12 NOTE — Progress Notes (Signed)
  Subjective:     [name] is a 53 y.o. female who presents for evaluation of sinus pain. Symptoms include: congestion, cough, facial pain, fevers, headaches, nasal congestion and sinus pressure. Onset of symptoms was 3 days ago. Symptoms have been gradually worsening since that time. Past history is significant for environmental allergies. Patient is a non-smoker.  The following portions of the patient's history were reviewed and updated as appropriate: allergies, current medications, past family history, past medical history, past social history, past surgical history and problem list.  Review of Systems Pertinent items are noted in HPI.   Objective:    BP 114/70  Pulse 67  Temp(Src) 98.4 F (36.9 C) (Oral)  Wt 175 lb 4.3 oz (79.5 kg)  SpO2 98% General appearance: alert, cooperative, appears stated age and no distress Ears: normal TM's and external ear canals both ears Nose: green discharge, moderate congestion, turbinates red, swollen, sinus tenderness bilateral Throat: abnormal findings: mild oropharyngeal erythema Neck: mild anterior cervical adenopathy and thyroid not enlarged, symmetric, no tenderness/mass/nodules Lungs: clear to auscultation bilaterally Heart: S1, S2 normal Extremities: extremities normal, atraumatic, no cyanosis or edema    Assessment:    Acute bacterial sinusitis.    Plan:    Nasal steroids per medication orders. Antihistamines per medication orders. Biaxin per medication orders.

## 2014-09-12 NOTE — Patient Instructions (Signed)

## 2014-09-22 ENCOUNTER — Other Ambulatory Visit: Payer: Self-pay | Admitting: *Deleted

## 2014-09-22 MED ORDER — DEXLANSOPRAZOLE 30 MG PO CPDR: 30.0000 mg | DELAYED_RELEASE_CAPSULE | Freq: Every day | ORAL | Status: DC

## 2014-10-09 ENCOUNTER — Ambulatory Visit: Payer: Self-pay

## 2015-01-01 ENCOUNTER — Encounter: Payer: Self-pay | Admitting: Family Medicine

## 2015-01-01 ENCOUNTER — Encounter: Payer: Self-pay | Admitting: Internal Medicine

## 2015-01-01 ENCOUNTER — Telehealth: Payer: Self-pay | Admitting: Internal Medicine

## 2015-01-01 NOTE — Telephone Encounter (Signed)
Explained to the patient on how to get a prescription transferred. Also discussed OTC PPI prilosec and prevacid.

## 2015-01-01 NOTE — Telephone Encounter (Signed)
Error

## 2015-01-05 NOTE — Telephone Encounter (Signed)
Patient states that she no longer needs anything. She is back home from being out of town and has her Dexilant available.

## 2015-01-16 ENCOUNTER — Telehealth: Payer: Self-pay | Admitting: Internal Medicine

## 2015-01-16 ENCOUNTER — Other Ambulatory Visit: Payer: Self-pay | Admitting: *Deleted

## 2015-01-16 MED ORDER — DEXLANSOPRAZOLE 30 MG PO CPDR: 30.0000 mg | DELAYED_RELEASE_CAPSULE | Freq: Every day | ORAL | Status: DC

## 2015-01-16 NOTE — Telephone Encounter (Signed)
Rx sent 

## 2015-02-11 ENCOUNTER — Other Ambulatory Visit: Payer: Self-pay | Admitting: Gynecology

## 2015-02-12 LAB — CYTOLOGY - PAP

## 2015-02-16 ENCOUNTER — Telehealth: Payer: Self-pay | Admitting: *Deleted

## 2015-02-16 ENCOUNTER — Encounter: Payer: Self-pay | Admitting: *Deleted

## 2015-02-16 NOTE — Telephone Encounter (Signed)
Pre-Visit Call completed with patient and chart updated.   Pre-Visit Info documented in Specialty Comments under [employer].    

## 2015-02-17 ENCOUNTER — Ambulatory Visit (INDEPENDENT_AMBULATORY_CARE_PROVIDER_SITE_OTHER): Payer: PRIVATE HEALTH INSURANCE | Admitting: Family Medicine

## 2015-02-17 ENCOUNTER — Encounter: Payer: Self-pay | Admitting: Family Medicine

## 2015-02-17 VITALS — BP 116/60 | HR 65 | Temp 98.3°F | Ht 71.0 in | Wt 176.2 lb

## 2015-02-17 DIAGNOSIS — Z8774 Personal history of (corrected) congenital malformations of heart and circulatory system: Secondary | ICD-10-CM

## 2015-02-17 DIAGNOSIS — Z0000 Encounter for general adult medical examination without abnormal findings: Secondary | ICD-10-CM

## 2015-02-17 DIAGNOSIS — Z9889 Other specified postprocedural states: Secondary | ICD-10-CM

## 2015-02-17 NOTE — Progress Notes (Signed)
""""Subjective:     [name] is a 54 y.o. female and is here for a comprehensive physical exam. The patient reports no problems.  History   Social History  . Marital Status: Married    Spouse Name: Richard  . Number of Children: N/A  . Years of Education: N/A   Occupational History  . SALES    Social History Main Topics  . Smoking status: Former Smoker -- 1.00 packs/day for 10 years    Types: Cigarettes    Quit date: 12/12/1988  . Smokeless tobacco: Never Used  . Alcohol Use: Yes     Comment: social use/wine  . Drug Use: No  . Sexual Activity: Not on file   Other Topics Concern  . Not on file   Social History Narrative   She has been married for 33 years. They have 2 children and at least one grandchild. She lives with her husband. She does all her activities of daily living. She works as a Midwife for [employer].     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 She does other days of the gym and she is able to at least 60 minutes a day 5 days a week.   She is a former smoker who quit in 1990. This was after smoking a pack a day for 10 years. She drinks social alcohol on occasion.   Health Maintenance  Topic Date Due  . HIV Screening  02/17/2016 (Originally 10/30/1976)  . INFLUENZA VACCINE  07/13/2015  . MAMMOGRAM  01/13/2016  . PAP SMEAR  02/10/2018  . COLONOSCOPY  02/17/2021  . TETANUS/TDAP  01/15/2022    The following portions of the patient's history were reviewed and updated as appropriate:  She  has a past medical history of Loss of appetite; Allergic rhinitis; Back pain; History of Ostium Secundum Atrial Septal Defect (1989); Varicose veins; IBS (irritable bowel syndrome); Intestinal volvulus (May 2007); Endometriosis; migraines; Anxiety; Onychomycosis; Fatigue; Contact lens/glasses fitting; Incontinence; and Hiatal hernia. She  does not have any  pertinent problems on file. She  has past surgical history that includes ASD repair, secundum (1990); hysterectomy for severe endometriosis; right leg fracture with surgery; varicose laser vein surgery (2009); right ankle surgery (2010); Abdominal hysterectomy (2004); transthoracic echocardiogram ( January 2012); and Nasal septum surgery. Her family history includes Cancer in her maternal grandmother and paternal grandfather; Clotting disorder in her maternal grandmother and sister; Colon cancer in her paternal grandfather; Diabetes in her mother; Heart disease in her maternal grandfather and sister; Lung cancer in her maternal grandmother; Prostate cancer in her maternal grandfather. She  reports that she quit smoking about 26 years ago. Her smoking use included Cigarettes. She has a 10 pack-year smoking history. She has never used smokeless tobacco. She reports that she drinks alcohol. She reports that she does not use illicit drugs. She has a current medication list which includes the following prescription(s): alprazolam, b complex-biotin-fa, calcium carb-cholecalciferol, calcium glycerophosphate, cyclobenzaprine, dexlansoprazole, epipen 2-pak, levocetirizine, magnesium oxide, montelukast, multiple vitamin, fish oil, pataday, polyethylene glycol powder, and probiotic product. Current Outpatient Prescriptions on File Prior to Visit  Medication Sig Dispense Refill  . ALPRAZolam (XANAX) 0.5 MG tablet Take 1/2 to 1 tablet by mouth three times daily as needed for nerves 90 tablet 0  . B Complex-Biotin-FA (B COMPLEX 100 TR PO) Take 1 tablet by mouth daily.      [name]  Calcium Carb-Cholecalciferol (CALTRATE 600+D) 600-800 MG-UNIT TABS Take 1 tablet by mouth 2 (two) times daily.    . Calcium Glycerophosphate (PRELIEF) 340 (65-50) MG (CA-P) TABS Take 1 tablet by mouth as needed.    . cyclobenzaprine (FLEXERIL) 10 MG tablet Take 10 mg by mouth as needed.    [name] Dexlansoprazole 30 MG capsule Take 1 capsule (30 mg  total) by mouth daily. 30 capsule 3  . EPIPEN 2-PAK 0.3 MG/0.3ML SOAJ injection     . levocetirizine (XYZAL) 5 MG tablet     . magnesium oxide (MAG-OX) 400 MG tablet Take 400 mg by mouth daily.    . montelukast (SINGULAIR) 10 MG tablet     . Multiple Vitamin (MULTIVITAMINS PO) Take 1 tablet by mouth daily.      . Omega-3 Fatty Acids (FISH OIL) 1000 MG CAPS Take 1 capsule by mouth daily.      [name] PATADAY 0.2 % SOLN     . polyethylene glycol powder (MIRALAX) powder Take 17 g by mouth at bedtime.     . Probiotic Product (PROBIOTIC DAILY PO) Take 1 capsule by mouth daily.      No current facility-administered medications on file prior to visit.   She is allergic to chlordiazepoxide-clidinium; codeine; and penicillins..  Review of Systems Review of Systems  Constitutional: Negative for activity change, appetite change and fatigue.  HENT: Negative for hearing loss, congestion, tinnitus and ear discharge.  dentist q69m Eyes: Negative for visual disturbance (see optho q1y -- vision corrected to 20/20 with glasses).  Respiratory: Negative for cough, chest tightness and shortness of breath.   Cardiovascular: Negative for chest pain, palpitations and leg swelling.  Gastrointestinal: Negative for abdominal pain, diarrhea, constipation and abdominal distention.  Genitourinary: Negative for urgency, frequency, decreased urine volume and difficulty urinating.  Musculoskeletal: Negative for back pain, arthralgias and gait problem.  Skin: Negative for color change, pallor and rash.  Neurological: Negative for dizziness, light-headedness, numbness and headaches.  Hematological: Negative for adenopathy. Does not bruise/bleed easily.  Psychiatric/Behavioral: Negative for suicidal ideas, confusion, sleep disturbance, self-injury, dysphoric mood, decreased concentration and agitation.       Objective:    BP 116/60 mmHg  Pulse 65  Temp(Src) 98.3 F (36.8 C) (Oral)  Ht 5\' 11"  (1.803 m)  Wt 176 lb 3.2  oz (79.924 kg)  BMI 24.59 kg/m2  SpO2 97% General appearance: alert, cooperative, appears stated age and no distress Head: Normocephalic, without obvious abnormality, atraumatic Eyes: conjunctivae/corneas clear. PERRL, EOM's intact. Fundi benign. Ears: normal TM's and external ear canals both ears Nose: Nares normal. Septum midline. Mucosa normal. No drainage or sinus tenderness. Throat: lips, mucosa, and tongue normal; teeth and gums normal Neck: no adenopathy, no carotid bruit, no JVD, supple, symmetrical, trachea midline and thyroid not enlarged, symmetric, no tenderness/mass/nodules Back: symmetric, no curvature. ROM normal. No CVA tenderness. Lungs: clear to auscultation bilaterally Breasts: gyn Heart: regular rate and rhythm, S1, S2 normal, no murmur, click, rub or gallop Abdomen: soft, non-tender; bowel sounds normal; no masses,  no organomegaly Pelvic: deferred--gyn Extremities: extremities normal, atraumatic, no cyanosis or edema Pulses: 2+ and symmetric Skin: Skin color, texture, turgor normal. No rashes or lesions Lymph nodes: Cervical, supraclavicular, and axillary nodes normal. Neurologic: Alert and oriented X 3, normal strength and tone. Normal symmetric reflexes. Normal coordination and gait Psych- no depression, no anxiety      Assessment:    Healthy female exam.     Plan:    ghm utd  Check  labs See After Visit Summary for Counseling Recommendations    1. Preventative health care   - Basic metabolic panel; Future - CBC with Differential/Platelet; Future - Hepatic function panel; Future - Lipid panel; Future - POCT urinalysis dipstick; Future - TSH; Future  2. Status post patch closure of ASD F/u cardiology - EKG 12-Lead

## 2015-02-17 NOTE — Progress Notes (Signed)
Pre visit review using our clinic review tool, if applicable. No additional management support is needed unless otherwise documented below in the visit note. 

## 2015-02-17 NOTE — Patient Instructions (Signed)
Preventive Care for Adults A healthy lifestyle and preventive care can promote health and wellness. Preventive health guidelines for women include the following key practices.  A routine yearly physical is a good way to check with your health care provider about your health and preventive screening. It is a chance to share any concerns and updates on your health and to receive a thorough exam.  Visit your dentist for a routine exam and preventive care every 6 months. Brush your teeth twice a day and floss once a day. Good oral hygiene prevents tooth decay and gum disease.  The frequency of eye exams is based on your age, health, family medical history, use of contact lenses, and other factors. Follow your health care provider's recommendations for frequency of eye exams.  Eat a healthy diet. Foods like vegetables, fruits, whole grains, low-fat dairy products, and lean protein foods contain the nutrients you need without too many calories. Decrease your intake of foods high in solid fats, added sugars, and salt. Eat the right amount of calories for you.Get information about a proper diet from your health care provider, if necessary.  Regular physical exercise is one of the most important things you can do for your health. Most adults should get at least 150 minutes of moderate-intensity exercise (any activity that increases your heart rate and causes you to sweat) each week. In addition, most adults need muscle-strengthening exercises on 2 or more days a week.  Maintain a healthy weight. The body mass index (BMI) is a screening tool to identify possible weight problems. It provides an estimate of body fat based on height and weight. Your health care provider can find your BMI and can help you achieve or maintain a healthy weight.For adults 20 years and older:  A BMI below 18.5 is considered underweight.  A BMI of 18.5 to 24.9 is normal.  A BMI of 25 to 29.9 is considered overweight.  A BMI of  30 and above is considered obese.  Maintain normal blood lipids and cholesterol levels by exercising and minimizing your intake of saturated fat. Eat a balanced diet with plenty of fruit and vegetables. Blood tests for lipids and cholesterol should begin at age 76 and be repeated every 5 years. If your lipid or cholesterol levels are high, you are over 50, or you are at high risk for heart disease, you may need your cholesterol levels checked more frequently.Ongoing high lipid and cholesterol levels should be treated with medicines if diet and exercise are not working.  If you smoke, find out from your health care provider how to quit. If you do not use tobacco, do not start.  Lung cancer screening is recommended for adults aged 22-80 years who are at high risk for developing lung cancer because of a history of smoking. A yearly low-dose CT scan of the lungs is recommended for people who have at least a 30-pack-year history of smoking and are a current smoker or have quit within the past 15 years. A pack year of smoking is smoking an average of 1 pack of cigarettes a day for 1 year (for example: 1 pack a day for 30 years or 2 packs a day for 15 years). Yearly screening should continue until the smoker has stopped smoking for at least 15 years. Yearly screening should be stopped for people who develop a health problem that would prevent them from having lung cancer treatment.  If you are pregnant, do not drink alcohol. If you are breastfeeding,  be very cautious about drinking alcohol. If you are not pregnant and choose to drink alcohol, do not have more than 1 drink per day. One drink is considered to be 12 ounces (355 mL) of beer, 5 ounces (148 mL) of wine, or 1.5 ounces (44 mL) of liquor.  Avoid use of street drugs. Do not share needles with anyone. Ask for help if you need support or instructions about stopping the use of drugs.  High blood pressure causes heart disease and increases the risk of  stroke. Your blood pressure should be checked at least every 1 to 2 years. Ongoing high blood pressure should be treated with medicines if weight loss and exercise do not work.  If you are 75-52 years old, ask your health care provider if you should take aspirin to prevent strokes.  Diabetes screening involves taking a blood sample to check your fasting blood sugar level. This should be done once every 3 years, after age 15, if you are within normal weight and without risk factors for diabetes. Testing should be considered at a younger age or be carried out more frequently if you are overweight and have at least 1 risk factor for diabetes.  Breast cancer screening is essential preventive care for women. You should practice "breast self-awareness." This means understanding the normal appearance and feel of your breasts and may include breast self-examination. Any changes detected, no matter how small, should be reported to a health care provider. Women in their 58s and 30s should have a clinical breast exam (CBE) by a health care provider as part of a regular health exam every 1 to 3 years. After age 16, women should have a CBE every year. Starting at age 53, women should consider having a mammogram (breast X-ray test) every year. Women who have a family history of breast cancer should talk to their health care provider about genetic screening. Women at a high risk of breast cancer should talk to their health care providers about having an MRI and a mammogram every year.  Breast cancer gene (BRCA)-related cancer risk assessment is recommended for women who have family members with BRCA-related cancers. BRCA-related cancers include breast, ovarian, tubal, and peritoneal cancers. Having family members with these cancers may be associated with an increased risk for harmful changes (mutations) in the breast cancer genes BRCA1 and BRCA2. Results of the assessment will determine the need for genetic counseling and  BRCA1 and BRCA2 testing.  Routine pelvic exams to screen for cancer are no longer recommended for nonpregnant women who are considered low risk for cancer of the pelvic organs (ovaries, uterus, and vagina) and who do not have symptoms. Ask your health care provider if a screening pelvic exam is right for you.  If you have had past treatment for cervical cancer or a condition that could lead to cancer, you need Pap tests and screening for cancer for at least 20 years after your treatment. If Pap tests have been discontinued, your risk factors (such as having a new sexual partner) need to be reassessed to determine if screening should be resumed. Some women have medical problems that increase the chance of getting cervical cancer. In these cases, your health care provider may recommend more frequent screening and Pap tests.  The HPV test is an additional test that may be used for cervical cancer screening. The HPV test looks for the virus that can cause the cell changes on the cervix. The cells collected during the Pap test can be  tested for HPV. The HPV test could be used to screen women aged 30 years and older, and should be used in women of any age who have unclear Pap test results. After the age of 30, women should have HPV testing at the same frequency as a Pap test.  Colorectal cancer can be detected and often prevented. Most routine colorectal cancer screening begins at the age of 50 years and continues through age 75 years. However, your health care provider may recommend screening at an earlier age if you have risk factors for colon cancer. On a yearly basis, your health care provider may provide home test kits to check for hidden blood in the stool. Use of a small camera at the end of a tube, to directly examine the colon (sigmoidoscopy or colonoscopy), can detect the earliest forms of colorectal cancer. Talk to your health care provider about this at age 50, when routine screening begins. Direct  exam of the colon should be repeated every 5-10 years through age 75 years, unless early forms of pre-cancerous polyps or small growths are found.  People who are at an increased risk for hepatitis B should be screened for this virus. You are considered at high risk for hepatitis B if:  You were born in a country where hepatitis B occurs often. Talk with your health care provider about which countries are considered high risk.  Your parents were born in a high-risk country and you have not received a shot to protect against hepatitis B (hepatitis B vaccine).  You have HIV or AIDS.  You use needles to inject street drugs.  You live with, or have sex with, someone who has hepatitis B.  You get hemodialysis treatment.  You take certain medicines for conditions like cancer, organ transplantation, and autoimmune conditions.  Hepatitis C blood testing is recommended for all people born from 1945 through 1965 and any individual with known risks for hepatitis C.  Practice safe sex. Use condoms and avoid high-risk sexual practices to reduce the spread of sexually transmitted infections (STIs). STIs include gonorrhea, chlamydia, syphilis, trichomonas, herpes, HPV, and human immunodeficiency virus (HIV). Herpes, HIV, and HPV are viral illnesses that have no cure. They can result in disability, cancer, and death.  You should be screened for sexually transmitted illnesses (STIs) including gonorrhea and chlamydia if:  You are sexually active and are younger than 24 years.  You are older than 24 years and your health care provider tells you that you are at risk for this type of infection.  Your sexual activity has changed since you were last screened and you are at an increased risk for chlamydia or gonorrhea. Ask your health care provider if you are at risk.  If you are at risk of being infected with HIV, it is recommended that you take a prescription medicine daily to prevent HIV infection. This is  called preexposure prophylaxis (PrEP). You are considered at risk if:  You are a heterosexual woman, are sexually active, and are at increased risk for HIV infection.  You take drugs by injection.  You are sexually active with a partner who has HIV.  Talk with your health care provider about whether you are at high risk of being infected with HIV. If you choose to begin PrEP, you should first be tested for HIV. You should then be tested every 3 months for as long as you are taking PrEP.  Osteoporosis is a disease in which the bones lose minerals and strength   with aging. This can result in serious bone fractures or breaks. The risk of osteoporosis can be identified using a bone density scan. Women ages 65 years and over and women at risk for fractures or osteoporosis should discuss screening with their health care providers. Ask your health care provider whether you should take a calcium supplement or vitamin D to reduce the rate of osteoporosis.  Menopause can be associated with physical symptoms and risks. Hormone replacement therapy is available to decrease symptoms and risks. You should talk to your health care provider about whether hormone replacement therapy is right for you.  Use sunscreen. Apply sunscreen liberally and repeatedly throughout the day. You should seek shade when your shadow is shorter than you. Protect yourself by wearing long sleeves, pants, a wide-brimmed hat, and sunglasses year round, whenever you are outdoors.  Once a month, do a whole body skin exam, using a mirror to look at the skin on your back. Tell your health care provider of new moles, moles that have irregular borders, moles that are larger than a pencil eraser, or moles that have changed in shape or color.  Stay current with required vaccines (immunizations).  Influenza vaccine. All adults should be immunized every year.  Tetanus, diphtheria, and acellular pertussis (Td, Tdap) vaccine. Pregnant women should  receive 1 dose of Tdap vaccine during each pregnancy. The dose should be obtained regardless of the length of time since the last dose. Immunization is preferred during the 27th-36th week of gestation. An adult who has not previously received Tdap or who does not know her vaccine status should receive 1 dose of Tdap. This initial dose should be followed by tetanus and diphtheria toxoids (Td) booster doses every 10 years. Adults with an unknown or incomplete history of completing a 3-dose immunization series with Td-containing vaccines should begin or complete a primary immunization series including a Tdap dose. Adults should receive a Td booster every 10 years.  Varicella vaccine. An adult without evidence of immunity to varicella should receive 2 doses or a second dose if she has previously received 1 dose. Pregnant females who do not have evidence of immunity should receive the first dose after pregnancy. This first dose should be obtained before leaving the health care facility. The second dose should be obtained 4-8 weeks after the first dose.  Human papillomavirus (HPV) vaccine. Females aged 13-26 years who have not received the vaccine previously should obtain the 3-dose series. The vaccine is not recommended for use in pregnant females. However, pregnancy testing is not needed before receiving a dose. If a female is found to be pregnant after receiving a dose, no treatment is needed. In that case, the remaining doses should be delayed until after the pregnancy. Immunization is recommended for any person with an immunocompromised condition through the age of 26 years if she did not get any or all doses earlier. During the 3-dose series, the second dose should be obtained 4-8 weeks after the first dose. The third dose should be obtained 24 weeks after the first dose and 16 weeks after the second dose.  Zoster vaccine. One dose is recommended for adults aged 60 years or older unless certain conditions are  present.  Measles, mumps, and rubella (MMR) vaccine. Adults born before 1957 generally are considered immune to measles and mumps. Adults born in 1957 or later should have 1 or more doses of MMR vaccine unless there is a contraindication to the vaccine or there is laboratory evidence of immunity to   each of the three diseases. A routine second dose of MMR vaccine should be obtained at least 28 days after the first dose for students attending postsecondary schools, health care workers, or international travelers. People who received inactivated measles vaccine or an unknown type of measles vaccine during 1963-1967 should receive 2 doses of MMR vaccine. People who received inactivated mumps vaccine or an unknown type of mumps vaccine before 1979 and are at high risk for mumps infection should consider immunization with 2 doses of MMR vaccine. For females of childbearing age, rubella immunity should be determined. If there is no evidence of immunity, females who are not pregnant should be vaccinated. If there is no evidence of immunity, females who are pregnant should delay immunization until after pregnancy. Unvaccinated health care workers born before 1957 who lack laboratory evidence of measles, mumps, or rubella immunity or laboratory confirmation of disease should consider measles and mumps immunization with 2 doses of MMR vaccine or rubella immunization with 1 dose of MMR vaccine.  Pneumococcal 13-valent conjugate (PCV13) vaccine. When indicated, a person who is uncertain of her immunization history and has no record of immunization should receive the PCV13 vaccine. An adult aged 19 years or older who has certain medical conditions and has not been previously immunized should receive 1 dose of PCV13 vaccine. This PCV13 should be followed with a dose of pneumococcal polysaccharide (PPSV23) vaccine. The PPSV23 vaccine dose should be obtained at least 8 weeks after the dose of PCV13 vaccine. An adult aged 19  years or older who has certain medical conditions and previously received 1 or more doses of PPSV23 vaccine should receive 1 dose of PCV13. The PCV13 vaccine dose should be obtained 1 or more years after the last PPSV23 vaccine dose.  Pneumococcal polysaccharide (PPSV23) vaccine. When PCV13 is also indicated, PCV13 should be obtained first. All adults aged 65 years and older should be immunized. An adult younger than age 65 years who has certain medical conditions should be immunized. Any person who resides in a nursing home or long-term care facility should be immunized. An adult smoker should be immunized. People with an immunocompromised condition and certain other conditions should receive both PCV13 and PPSV23 vaccines. People with human immunodeficiency virus (HIV) infection should be immunized as soon as possible after diagnosis. Immunization during chemotherapy or radiation therapy should be avoided. Routine use of PPSV23 vaccine is not recommended for American Indians, [place] Natives, or people younger than 65 years unless there are medical conditions that require PPSV23 vaccine. When indicated, people who have unknown immunization and have no record of immunization should receive PPSV23 vaccine. One-time revaccination 5 years after the first dose of PPSV23 is recommended for people aged 19-64 years who have chronic kidney failure, nephrotic syndrome, asplenia, or immunocompromised conditions. People who received 1-2 doses of PPSV23 before age 65 years should receive another dose of PPSV23 vaccine at age 65 years or later if at least 5 years have passed since the previous dose. Doses of PPSV23 are not needed for people immunized with PPSV23 at or after age 65 years.  Meningococcal vaccine. Adults with asplenia or persistent complement component deficiencies should receive 2 doses of quadrivalent meningococcal conjugate (MenACWY-D) vaccine. The doses should be obtained at least 2 months apart.  Microbiologists working with certain meningococcal bacteria, military recruits, people at risk during an outbreak, and people who travel to or live in countries with a high rate of meningitis should be immunized. A first-year college student up through age   21 years who is living in a residence hall should receive a dose if she did not receive a dose on or after her 16th birthday. Adults who have certain high-risk conditions should receive one or more doses of vaccine.  Hepatitis A vaccine. Adults who wish to be protected from this disease, have certain high-risk conditions, work with hepatitis A-infected animals, work in hepatitis A research labs, or travel to or work in countries with a high rate of hepatitis A should be immunized. Adults who were previously unvaccinated and who anticipate close contact with an international adoptee during the first 60 days after arrival in the [place] from a country with a high rate of hepatitis A should be immunized.  Hepatitis B vaccine. Adults who wish to be protected from this disease, have certain high-risk conditions, may be exposed to blood or other infectious body fluids, are household contacts or sex partners of hepatitis B positive people, are clients or workers in certain care facilities, or travel to or work in countries with a high rate of hepatitis B should be immunized.  Haemophilus influenzae type b (Hib) vaccine. A previously unvaccinated person with asplenia or sickle cell disease or having a scheduled splenectomy should receive 1 dose of Hib vaccine. Regardless of previous immunization, a recipient of a hematopoietic stem cell transplant should receive a 3-dose series 6-12 months after her successful transplant. Hib vaccine is not recommended for adults with HIV infection. Preventive Services / Frequency Ages 64 to 68 years  Blood pressure check.** / Every 1 to 2 years.  Lipid and cholesterol check.** / Every 5 years beginning at age  22.  Clinical breast exam.** / Every 3 years for women in their 88s and 53s.  BRCA-related cancer risk assessment.** / For women who have family members with a BRCA-related cancer (breast, ovarian, tubal, or peritoneal cancers).  Pap test.** / Every 2 years from ages 90 through 51. Every 3 years starting at age 21 through age 56 or 3 with a history of 3 consecutive normal Pap tests.  HPV screening.** / Every 3 years from ages 24 through ages 1 to 46 with a history of 3 consecutive normal Pap tests.  Hepatitis C blood test.** / For any individual with known risks for hepatitis C.  Skin self-exam. / Monthly.  Influenza vaccine. / Every year.  Tetanus, diphtheria, and acellular pertussis (Tdap, Td) vaccine.** / Consult your health care provider. Pregnant women should receive 1 dose of Tdap vaccine during each pregnancy. 1 dose of Td every 10 years.  Varicella vaccine.** / Consult your health care provider. Pregnant females who do not have evidence of immunity should receive the first dose after pregnancy.  HPV vaccine. / 3 doses over 6 months, if 72 and younger. The vaccine is not recommended for use in pregnant females. However, pregnancy testing is not needed before receiving a dose.  Measles, mumps, rubella (MMR) vaccine.** / You need at least 1 dose of MMR if you were born in 1957 or later. You may also need a 2nd dose. For females of childbearing age, rubella immunity should be determined. If there is no evidence of immunity, females who are not pregnant should be vaccinated. If there is no evidence of immunity, females who are pregnant should delay immunization until after pregnancy.  Pneumococcal 13-valent conjugate (PCV13) vaccine.** / Consult your health care provider.  Pneumococcal polysaccharide (PPSV23) vaccine.** / 1 to 2 doses if you smoke cigarettes or if you have certain conditions.  Meningococcal vaccine.** /  1 dose if you are age 19 to 21 years and a first-year college  student living in a residence hall, or have one of several medical conditions, you need to get vaccinated against meningococcal disease. You may also need additional booster doses.  Hepatitis A vaccine.** / Consult your health care provider.  Hepatitis B vaccine.** / Consult your health care provider.  Haemophilus influenzae type b (Hib) vaccine.** / Consult your health care provider. Ages 40 to 64 years  Blood pressure check.** / Every 1 to 2 years.  Lipid and cholesterol check.** / Every 5 years beginning at age 20 years.  Lung cancer screening. / Every year if you are aged 55-80 years and have a 30-pack-year history of smoking and currently smoke or have quit within the past 15 years. Yearly screening is stopped once you have quit smoking for at least 15 years or develop a health problem that would prevent you from having lung cancer treatment.  Clinical breast exam.** / Every year after age 40 years.  BRCA-related cancer risk assessment.** / For women who have family members with a BRCA-related cancer (breast, ovarian, tubal, or peritoneal cancers).  Mammogram.** / Every year beginning at age 40 years and continuing for as long as you are in good health. Consult with your health care provider.  Pap test.** / Every 3 years starting at age 30 years through age 65 or 70 years with a history of 3 consecutive normal Pap tests.  HPV screening.** / Every 3 years from ages 30 years through ages 65 to 70 years with a history of 3 consecutive normal Pap tests.  Fecal occult blood test (FOBT) of stool. / Every year beginning at age 50 years and continuing until age 75 years. You may not need to do this test if you get a colonoscopy every 10 years.  Flexible sigmoidoscopy or colonoscopy.** / Every 5 years for a flexible sigmoidoscopy or every 10 years for a colonoscopy beginning at age 50 years and continuing until age 75 years.  Hepatitis C blood test.** / For all people born from 1945 through  1965 and any individual with known risks for hepatitis C.  Skin self-exam. / Monthly.  Influenza vaccine. / Every year.  Tetanus, diphtheria, and acellular pertussis (Tdap/Td) vaccine.** / Consult your health care provider. Pregnant women should receive 1 dose of Tdap vaccine during each pregnancy. 1 dose of Td every 10 years.  Varicella vaccine.** / Consult your health care provider. Pregnant females who do not have evidence of immunity should receive the first dose after pregnancy.  Zoster vaccine.** / 1 dose for adults aged 60 years or older.  Measles, mumps, rubella (MMR) vaccine.** / You need at least 1 dose of MMR if you were born in 1957 or later. You may also need a 2nd dose. For females of childbearing age, rubella immunity should be determined. If there is no evidence of immunity, females who are not pregnant should be vaccinated. If there is no evidence of immunity, females who are pregnant should delay immunization until after pregnancy.  Pneumococcal 13-valent conjugate (PCV13) vaccine.** / Consult your health care provider.  Pneumococcal polysaccharide (PPSV23) vaccine.** / 1 to 2 doses if you smoke cigarettes or if you have certain conditions.  Meningococcal vaccine.** / Consult your health care provider.  Hepatitis A vaccine.** / Consult your health care provider.  Hepatitis B vaccine.** / Consult your health care provider.  Haemophilus influenzae type b (Hib) vaccine.** / Consult your health care provider. Ages 65   years and over  Blood pressure check.** / Every 1 to 2 years.  Lipid and cholesterol check.** / Every 5 years beginning at age 22 years.  Lung cancer screening. / Every year if you are aged 73-80 years and have a 30-pack-year history of smoking and currently smoke or have quit within the past 15 years. Yearly screening is stopped once you have quit smoking for at least 15 years or develop a health problem that would prevent you from having lung cancer  treatment.  Clinical breast exam.** / Every year after age 4 years.  BRCA-related cancer risk assessment.** / For women who have family members with a BRCA-related cancer (breast, ovarian, tubal, or peritoneal cancers).  Mammogram.** / Every year beginning at age 40 years and continuing for as long as you are in good health. Consult with your health care provider.  Pap test.** / Every 3 years starting at age 9 years through age 34 or 91 years with 3 consecutive normal Pap tests. Testing can be stopped between 65 and 70 years with 3 consecutive normal Pap tests and no abnormal Pap or HPV tests in the past 10 years.  HPV screening.** / Every 3 years from ages 57 years through ages 64 or 45 years with a history of 3 consecutive normal Pap tests. Testing can be stopped between 65 and 70 years with 3 consecutive normal Pap tests and no abnormal Pap or HPV tests in the past 10 years.  Fecal occult blood test (FOBT) of stool. / Every year beginning at age 15 years and continuing until age 17 years. You may not need to do this test if you get a colonoscopy every 10 years.  Flexible sigmoidoscopy or colonoscopy.** / Every 5 years for a flexible sigmoidoscopy or every 10 years for a colonoscopy beginning at age 86 years and continuing until age 71 years.  Hepatitis C blood test.** / For all people born from [birth year] through 1967 and any individual with known risks for hepatitis C.  Osteoporosis screening.** / A one-time screening for women ages 83 years and over and women at risk for fractures or osteoporosis.  Skin self-exam. / Monthly.  Influenza vaccine. / Every year.  Tetanus, diphtheria, and acellular pertussis (Tdap/Td) vaccine.** / 1 dose of Td every 10 years.  Varicella vaccine.** / Consult your health care provider.  Zoster vaccine.** / 1 dose for adults aged 61 years or older.  Pneumococcal 13-valent conjugate (PCV13) vaccine.** / Consult your health care provider.  Pneumococcal  polysaccharide (PPSV23) vaccine.** / 1 dose for all adults aged 28 years and older.  Meningococcal vaccine.** / Consult your health care provider.  Hepatitis A vaccine.** / Consult your health care provider.  Hepatitis B vaccine.** / Consult your health care provider.  Haemophilus influenzae type b (Hib) vaccine.** / Consult your health care provider. ** Family history and personal history of risk and conditions may change your health care provider's recommendations. Document Released: 01/24/2002 Document Revised: 04/14/2014 Document Reviewed: 04/25/2011 [facility] Patient Information 2015 [place], [place]. This information is not intended to replace advice given to you by your health care provider. Make sure you discuss any questions you have with your health care provider.

## 2015-02-18 ENCOUNTER — Other Ambulatory Visit (INDEPENDENT_AMBULATORY_CARE_PROVIDER_SITE_OTHER): Payer: PRIVATE HEALTH INSURANCE

## 2015-02-18 DIAGNOSIS — Z0000 Encounter for general adult medical examination without abnormal findings: Secondary | ICD-10-CM

## 2015-02-18 LAB — HEPATIC FUNCTION PANEL
ALT: 20 U/L (ref 0–35)
AST: 25 U/L (ref 0–37)
Albumin: 4.2 g/dL (ref 3.5–5.2)
Alkaline Phosphatase: 52 U/L (ref 39–117)
BILIRUBIN DIRECT: 0.2 mg/dL (ref 0.0–0.3)
TOTAL PROTEIN: 7 g/dL (ref 6.0–8.3)
Total Bilirubin: 1.2 mg/dL (ref 0.2–1.2)

## 2015-02-18 LAB — CBC WITH DIFFERENTIAL/PLATELET
BASOS PCT: 0.6 % (ref 0.0–3.0)
Basophils Absolute: 0 10*3/uL (ref 0.0–0.1)
Eosinophils Absolute: 0.2 10*3/uL (ref 0.0–0.7)
Eosinophils Relative: 3.2 % (ref 0.0–5.0)
HEMATOCRIT: 42.9 % (ref 36.0–46.0)
Hemoglobin: 14.6 g/dL (ref 12.0–15.0)
LYMPHS ABS: 2.2 10*3/uL (ref 0.7–4.0)
LYMPHS PCT: 33.1 % (ref 12.0–46.0)
MCHC: 34 g/dL (ref 30.0–36.0)
MCV: 89.5 fl (ref 78.0–100.0)
MONO ABS: 0.6 10*3/uL (ref 0.1–1.0)
Monocytes Relative: 9.1 % (ref 3.0–12.0)
NEUTROS ABS: 3.5 10*3/uL (ref 1.4–7.7)
Neutrophils Relative %: 54 % (ref 43.0–77.0)
Platelets: 297 10*3/uL (ref 150.0–400.0)
RBC: 4.79 Mil/uL (ref 3.87–5.11)
RDW: 13.1 % (ref 11.5–15.5)
WBC: 6.5 10*3/uL (ref 4.0–10.5)

## 2015-02-18 LAB — LIPID PANEL
Cholesterol: 145 mg/dL (ref 0–200)
HDL: 45.4 mg/dL (ref >39.00)
LDL Cholesterol: 87 mg/dL (ref 0–99)
NONHDL: 99.6
TRIGLYCERIDES: 61 mg/dL (ref 0.0–149.0)
Total CHOL/HDL Ratio: 3
VLDL: 12.2 mg/dL (ref 0.0–40.0)

## 2015-02-18 LAB — BASIC METABOLIC PANEL
BUN: 14 mg/dL (ref 6–23)
CALCIUM: 9.5 mg/dL (ref 8.4–10.5)
CHLORIDE: 105 meq/L (ref 96–112)
CO2: 29 meq/L (ref 19–32)
CREATININE: 0.7 mg/dL (ref 0.40–1.20)
GFR: 92.23 mL/min (ref >60.00)
Glucose, Bld: 99 mg/dL (ref 70–99)
POTASSIUM: 3.8 meq/L (ref 3.5–5.1)
SODIUM: 140 meq/L (ref 135–145)

## 2015-02-18 LAB — TSH: TSH: 3.03 u[IU]/mL (ref 0.35–4.50)

## 2015-03-03 ENCOUNTER — Telehealth: Payer: Self-pay | Admitting: Family Medicine

## 2015-03-03 NOTE — Telephone Encounter (Signed)
No phone note

## 2015-05-05 ENCOUNTER — Encounter: Payer: Self-pay | Admitting: *Deleted

## 2015-05-13 ENCOUNTER — Ambulatory Visit (INDEPENDENT_AMBULATORY_CARE_PROVIDER_SITE_OTHER): Payer: PRIVATE HEALTH INSURANCE | Admitting: Physician Assistant

## 2015-05-13 ENCOUNTER — Encounter: Payer: Self-pay | Admitting: Physician Assistant

## 2015-05-13 VITALS — BP 118/57 | HR 72 | Temp 99.0°F | Ht 71.0 in | Wt 177.8 lb

## 2015-05-13 DIAGNOSIS — J019 Acute sinusitis, unspecified: Secondary | ICD-10-CM | POA: Diagnosis not present

## 2015-05-13 DIAGNOSIS — B9689 Other specified bacterial agents as the cause of diseases classified elsewhere: Secondary | ICD-10-CM | POA: Insufficient documentation

## 2015-05-13 MED ORDER — BENZONATATE 200 MG PO CAPS: 200.0000 mg | ORAL_CAPSULE | Freq: Three times a day (TID) | ORAL | Status: DC | PRN

## 2015-05-13 MED ORDER — DOXYCYCLINE HYCLATE 100 MG PO CAPS: 100.0000 mg | ORAL_CAPSULE | Freq: Two times a day (BID) | ORAL | Status: DC

## 2015-05-13 NOTE — Progress Notes (Signed)
Patient presents to clinic today c/o rapidly worsening chest congestion with productive cough associated with sinus pressure, sinus pain, facial tooth pain and ear pressure. Endorses intermittent fevers.  Denies recent travel or sick contact.  Past Medical History  Diagnosis Date  . Loss of appetite   . Allergic rhinitis   . Back pain   . History of Ostium Secundum Atrial Septal Defect 1989    Status post repair in 1989/1990  . Varicose veins   . IBS (irritable bowel syndrome)   . 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 Endometriosis   . Hx of migraines   . Anxiety   . Onychomycosis   . Fatigue   . Contact lens/glasses fitting   . Incontinence   . Hiatal hernia   . GERD (gastroesophageal reflux disease)     Current Outpatient Prescriptions on File Prior to Visit  Medication Sig Dispense Refill  . ALPRAZolam (XANAX) 0.5 MG tablet Take 1/2 to 1 tablet by mouth three times daily as needed for nerves 90 tablet 0  . B Complex-Biotin-FA (B COMPLEX 100 TR PO) Take 1 tablet by mouth daily.      . Calcium Carb-Cholecalciferol (CALTRATE 600+D) 600-800 MG-UNIT TABS Take 1 tablet by mouth 2 (two) times daily.    . Calcium Glycerophosphate (PRELIEF) 340 (65-50) MG (CA-P) TABS Take 1 tablet by mouth as needed.    . cyclobenzaprine (FLEXERIL) 10 MG tablet Take 10 mg by mouth as needed.    [name] Dexlansoprazole 30 MG capsule Take 1 capsule (30 mg total) by mouth daily. 30 capsule 3  . EPIPEN 2-PAK 0.3 MG/0.3ML SOAJ injection     . levocetirizine (XYZAL) 5 MG tablet     . magnesium oxide (MAG-OX) 400 MG tablet Take 400 mg by mouth daily.    . montelukast (SINGULAIR) 10 MG tablet     . Multiple Vitamin (MULTIVITAMINS PO) Take 1 tablet by mouth daily.      . Omega-3 Fatty Acids (FISH OIL) 1000 MG CAPS Take 1 capsule by mouth daily.      [name] PATADAY 0.2 % SOLN     . polyethylene glycol powder (MIRALAX) powder Take 17 g by mouth at bedtime.     . Probiotic Product (PROBIOTIC DAILY PO) Take 1 capsule by mouth  daily.      No current facility-administered medications on file prior to visit.    Allergies  Allergen Reactions  . Chlordiazepoxide-Clidinium     "loopy"  . Codeine     REACTION: nausea,vomitting and dizziness  . Penicillins     REACTION: hives ---taken as a child    Family History  Problem Relation Age of Onset  . Diabetes Mother   . Colon cancer Paternal Grandfather   . Cancer Paternal Grandfather     colon  . Heart disease Maternal Grandfather   . Prostate cancer Maternal Grandfather   . Lung cancer Maternal Grandmother   . Clotting disorder Maternal Grandmother   . Cancer Maternal Grandmother     lung  . Clotting disorder Sister   . Heart disease Sister     History   Social History  . Marital Status: Married    Spouse Name: Richard  . Number of Children: N/A  . Years of Education: N/A   Occupational History  . SALES    Social History Main Topics  . Smoking status: Former Smoker -- 1.00 packs/day for 10 years    Types: Cigarettes    Quit date: 12/12/1988  .  Smokeless tobacco: Never Used  . Alcohol Use: Yes     Comment: social use/wine  . Drug Use: No  . Sexual Activity: Not on file   Other Topics Concern  . None   Social History Narrative   She has been married for 33 years. They have 2 children and at least one grandchild. She lives with her husband. She does all her activities of daily living. She works as a Midwife for [employer].     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 She does other days of the gym and she is able to at least 60 minutes a day 5 days a week.   She is a former smoker who quit in 1990. This was after smoking a pack a day for 10 years. She drinks social alcohol on occasion.   Review of Systems - See HPI.  All other ROS are negative.  BP 118/57 mmHg  Pulse 72  Temp(Src) 99 F (37.2 C) (Oral)  Ht 5\' 11"  (1.803 m)  Wt  177 lb 12.8 oz (80.65 kg)  BMI 24.81 kg/m2  SpO2 100%  Physical Exam  Constitutional: She is oriented to person, place, and time and well-developed, well-nourished, and in no distress.  HENT:  Head: Normocephalic and atraumatic.  Right Ear: Tympanic membrane, external ear and ear canal normal.  Left Ear: Tympanic membrane, external ear and ear canal normal.  Nose: Mucosal edema and rhinorrhea present. Right sinus exhibits maxillary sinus tenderness. Left sinus exhibits maxillary sinus tenderness.  Mouth/Throat: Uvula is midline, oropharynx is clear and moist and mucous membranes are normal. No oropharyngeal exudate.  Eyes: Conjunctivae are normal.  Neck: Neck supple.  Cardiovascular: Normal rate, regular rhythm, normal heart sounds and intact distal pulses.   Pulmonary/Chest: Effort normal and breath sounds normal. No respiratory distress. She has no wheezes. She has no rales. She exhibits no tenderness.  Neurological: She is alert and oriented to person, place, and time.  Skin: Skin is warm and dry. No rash noted.  Psychiatric: Affect normal.  Vitals reviewed.   Recent Results (from the past 2160 hour(s))  Basic metabolic panel     Status: None   Collection Time: 02/18/15  7:55 AM  Result Value Ref Range   Sodium 140 135 - 145 mEq/L   Potassium 3.8 3.5 - 5.1 mEq/L   Chloride 105 96 - 112 mEq/L   CO2 29 19 - 32 mEq/L   Glucose, Bld 99 70 - 99 mg/dL   BUN 14 6 - 23 mg/dL   Creatinine, Ser 0.70 0.40 - 1.20 mg/dL   Calcium 9.5 8.4 - 10.5 mg/dL   GFR 92.23 >60.00 mL/min  CBC with Differential/Platelet     Status: None   Collection Time: 02/18/15  7:55 AM  Result Value Ref Range   WBC 6.5 4.0 - 10.5 K/uL   RBC 4.79 3.87 - 5.11 Mil/uL   Hemoglobin 14.6 12.0 - 15.0 g/dL   HCT 42.9 36.0 - 46.0 %   MCV 89.5 78.0 - 100.0 fl   MCHC 34.0 30.0 - 36.0 g/dL   RDW 13.1 11.5 - 15.5 %   Platelets 297.0 150.0 - 400.0 K/uL   Neutrophils Relative % 54.0 43.0 - 77.0 %   Lymphocytes Relative  33.1 12.0 - 46.0 %   Monocytes Relative 9.1 3.0 - 12.0 %   Eosinophils Relative 3.2 0.0 - 5.0 %  Basophils Relative 0.6 0.0 - 3.0 %   Neutro Abs 3.5 1.4 - 7.7 K/uL   Lymphs Abs 2.2 0.7 - 4.0 K/uL   Monocytes Absolute 0.6 0.1 - 1.0 K/uL   Eosinophils Absolute 0.2 0.0 - 0.7 K/uL   Basophils Absolute 0.0 0.0 - 0.1 K/uL  Hepatic function panel     Status: None   Collection Time: 02/18/15  7:55 AM  Result Value Ref Range   Total Bilirubin 1.2 0.2 - 1.2 mg/dL   Bilirubin, Direct 0.2 0.0 - 0.3 mg/dL   Alkaline Phosphatase 52 39 - 117 U/L   AST 25 0 - 37 U/L   ALT 20 0 - 35 U/L   Total Protein 7.0 6.0 - 8.3 g/dL   Albumin 4.2 3.5 - 5.2 g/dL  Lipid panel     Status: None   Collection Time: 02/18/15  7:55 AM  Result Value Ref Range   Cholesterol 145 0 - 200 mg/dL    Comment: ATP III Classification       Desirable:  < 200 mg/dL               Borderline High:  200 - 239 mg/dL          High:  > = 240 mg/dL   Triglycerides 61.0 0.0 - 149.0 mg/dL    Comment: Normal:  <150 mg/dLBorderline High:  150 - 199 mg/dL   HDL 45.40 >39.00 mg/dL   VLDL 12.2 0.0 - 40.0 mg/dL   LDL Cholesterol 87 0 - 99 mg/dL   Total CHOL/HDL Ratio 3     Comment:                Men          Women1/2 Average Risk     3.4          3.3Average Risk          5.0          4.42X Average Risk          9.6          7.13X Average Risk          15.0          11.0                       NonHDL 99.60     Comment: NOTE:  Non-HDL goal should be 30 mg/dL higher than patient's LDL goal (i.e. LDL goal of < 70 mg/dL, would have non-HDL goal of < 100 mg/dL)  TSH     Status: None   Collection Time: 02/18/15  7:55 AM  Result Value Ref Range   TSH 3.03 0.35 - 4.50 uIU/mL    Assessment/Plan: Acute bacterial sinusitis Rx Doxycycline.  Increase fluids.  Rest.  Saline nasal spray.  Probiotic.  Mucinex as directed.  Humidifier in bedroom. Tessalon per orders.  Call or return to clinic if symptoms are not improving.

## 2015-05-13 NOTE — Patient Instructions (Signed)
Please take antibiotic as directed.  Increase fluid intake.  Use Saline nasal spray.  Take a daily multivitamin. Use Tessalon as directed.  Place a humidifier in the bedroom.  Please call or return clinic if symptoms are not improving.  Sinusitis Sinusitis is redness, soreness, and swelling (inflammation) of the paranasal sinuses. Paranasal sinuses are air pockets within the bones of your face (beneath the eyes, the middle of the forehead, or above the eyes). In healthy paranasal sinuses, mucus is able to drain out, and air is able to circulate through them by way of your nose. However, when your paranasal sinuses are inflamed, mucus and air can become trapped. This can allow bacteria and other germs to grow and cause infection. Sinusitis can develop quickly and last only a short time (acute) or continue over a long period (chronic). Sinusitis that lasts for more than 12 weeks is considered chronic.  CAUSES  Causes of sinusitis include:  Allergies.  Structural abnormalities, such as displacement of the cartilage that separates your nostrils (deviated septum), which can decrease the air flow through your nose and sinuses and affect sinus drainage.  Functional abnormalities, such as when the small hairs (cilia) that line your sinuses and help remove mucus do not work properly or are not present. SYMPTOMS  Symptoms of acute and chronic sinusitis are the same. The primary symptoms are pain and pressure around the affected sinuses. Other symptoms include:  Upper toothache.  Earache.  Headache.  Bad breath.  Decreased sense of smell and taste.  A cough, which worsens when you are lying flat.  Fatigue.  Fever.  Thick drainage from your nose, which often is green and may contain pus (purulent).  Swelling and warmth over the affected sinuses. DIAGNOSIS  Your caregiver will perform a physical exam. During the exam, your caregiver may:  Look in your nose for signs of abnormal growths in  your nostrils (nasal polyps).  Tap over the affected sinus to check for signs of infection.  View the inside of your sinuses (endoscopy) with a special imaging device with a light attached (endoscope), which is inserted into your sinuses. If your caregiver suspects that you have chronic sinusitis, one or more of the following tests may be recommended:  Allergy tests.  Nasal culture A sample of mucus is taken from your nose and sent to a lab and screened for bacteria.  Nasal cytology A sample of mucus is taken from your nose and examined by your caregiver to determine if your sinusitis is related to an allergy. TREATMENT  Most cases of acute sinusitis are related to a viral infection and will resolve on their own within 10 days. Sometimes medicines are prescribed to help relieve symptoms (pain medicine, decongestants, nasal steroid sprays, or saline sprays).  However, for sinusitis related to a bacterial infection, your caregiver will prescribe antibiotic medicines. These are medicines that will help kill the bacteria causing the infection.  Rarely, sinusitis is caused by a fungal infection. In theses cases, your caregiver will prescribe antifungal medicine. For some cases of chronic sinusitis, surgery is needed. Generally, these are cases in which sinusitis recurs more than 3 times per year, despite other treatments. HOME CARE INSTRUCTIONS   Drink plenty of water. Water helps thin the mucus so your sinuses can drain more easily.  Use a humidifier.  Inhale steam 3 to 4 times a day (for example, sit in the bathroom with the shower running).  Apply a warm, moist washcloth to your face 3 to  4 times a day, or as directed by your caregiver.  Use saline nasal sprays to help moisten and clean your sinuses.  Take over-the-counter or prescription medicines for pain, discomfort, or fever only as directed by your caregiver. SEEK IMMEDIATE MEDICAL CARE IF:  You have increasing pain or severe  headaches.  You have nausea, vomiting, or drowsiness.  You have swelling around your face.  You have vision problems.  You have a stiff neck.  You have difficulty breathing. MAKE SURE YOU:   Understand these instructions.  Will watch your condition.  Will get help right away if you are not doing well or get worse. Document Released: 11/28/2005 Document Revised: 02/20/2012 Document Reviewed: 12/13/2011 [facility] Patient Information 2014 [place], [place].

## 2015-05-13 NOTE — Assessment & Plan Note (Signed)
Rx Doxycycline.  Increase fluids.  Rest.  Saline nasal spray.  Probiotic.  Mucinex as directed.  Humidifier in bedroom. Tessalon per orders.  Call or return to clinic if symptoms are not improving.  

## 2015-05-13 NOTE — Progress Notes (Signed)
Pre visit review using our clinic review tool, if applicable. No additional management support is needed unless otherwise documented below in the visit note. 

## 2015-05-14 ENCOUNTER — Other Ambulatory Visit: Payer: Self-pay

## 2015-05-14 MED ORDER — DEXLANSOPRAZOLE 30 MG PO CPDR: 30.0000 mg | DELAYED_RELEASE_CAPSULE | Freq: Every day | ORAL | Status: DC

## 2015-06-03 ENCOUNTER — Encounter: Payer: Self-pay | Admitting: Internal Medicine

## 2015-06-03 ENCOUNTER — Ambulatory Visit (INDEPENDENT_AMBULATORY_CARE_PROVIDER_SITE_OTHER): Payer: PRIVATE HEALTH INSURANCE | Admitting: Internal Medicine

## 2015-06-03 ENCOUNTER — Other Ambulatory Visit (INDEPENDENT_AMBULATORY_CARE_PROVIDER_SITE_OTHER): Payer: PRIVATE HEALTH INSURANCE

## 2015-06-03 VITALS — BP 102/60 | HR 68 | Ht 70.25 in | Wt 174.2 lb

## 2015-06-03 DIAGNOSIS — K219 Gastro-esophageal reflux disease without esophagitis: Secondary | ICD-10-CM | POA: Diagnosis not present

## 2015-06-03 LAB — MAGNESIUM: Magnesium: 2 mg/dL (ref 1.5–2.5)

## 2015-06-03 LAB — VITAMIN B12: Vitamin B-12: 563 pg/mL (ref 211–911)

## 2015-06-03 LAB — VITAMIN D 25 HYDROXY (VIT D DEFICIENCY, FRACTURES): VITD: 39.5 ng/mL (ref 30.00–100.00)

## 2015-06-03 MED ORDER — DEXLANSOPRAZOLE 30 MG PO CPDR: 30.0000 mg | DELAYED_RELEASE_CAPSULE | Freq: Every day | ORAL | Status: DC

## 2015-06-03 NOTE — Patient Instructions (Signed)
   Your physician has requested that you go to the basement for the lab work before leaving today.   We have sent the following medications to your pharmacy for you to pick up at your convenience: Dexilant refilled for a year   Follow up with Dr Hilarie Fredrickson in a year or sooner if needed.    I appreciate the opportunity to care for you.

## 2015-06-03 NOTE — Progress Notes (Signed)
   Subjective:    Patient ID: [name], female    DOB: 1961-05-07, 54 y.o.   MRN: [id number]  HPI [name] is a 54 year old female with past medical history of GERD who seen for follow-up. She also has a history of ASD status post repair, endometriosis, migraines and back pain. She's here alone today. She was last seen one year ago. At that time Dexilant was decreased to 30 mg daily. She reports this is been working well to control her reflux. She also has made dietary modifications including eating smaller more frequent meals. She is using alkaline water which she reports has a slightly higher pH to make her coffee in the morning. She feels this is helped. She is using MiraLAX 17 g at bedtime daily which helps with constipation. She's having regular bowel movements without blood in her stool or melena. She denies abdominal pain. Denies dysphagia or odynophagia.  Prior bone mineral density was normal  Colonoscopy March 2012 excellent prep no polyps. Repeat recommended 2022   Review of Systems As per history of present illness, otherwise negative  Current Medications, Allergies, Past Medical History, Past Surgical History, Family History and Social History were reviewed in [employer] record.     Objective:   Physical Exam BP 102/60 mmHg  Pulse 68  Ht 5' 10.25" (1.784 m)  Wt 174 lb 4 oz (79.039 kg)  BMI 24.83 kg/m2 Constitutional: Well-developed and well-nourished. No distress. HEENT: Normocephalic and atraumatic. Oropharynx is clear and moist. No oropharyngeal exudate. Conjunctivae are normal.  No scleral icterus. Neck: Neck supple. Trachea midline. Cardiovascular: Normal rate, regular rhythm and intact distal pulses. No M/R/G Pulmonary/chest: Effort normal and breath sounds normal. No wheezing, rales or rhonchi. Abdominal: Soft, nontender, nondistended. Bowel sounds active throughout. There are no masses palpable. No  hepatosplenomegaly. Extremities: no clubbing, cyanosis, or edema Lymphadenopathy: No cervical adenopathy noted. Neurological: Alert and oriented to person place and time. Skin: Skin is warm and dry. No rashes noted. Psychiatric: Normal mood and affect. Behavior is normal.     Assessment & Plan:   54 year old female with past medical history of GERD who seen for follow-up.  1. GERD -- well-controlled and tolerating the lower dose PPI. She did have concerns today regarding PPI and long-term side effects. We spent considerable time today discussing chronic PPI therapy. We discussed calcium and bone mineral density data, the possibility of hypomagnesemia, the association with C. difficile and also aspiration pneumonia. We discussed recent nonrandomized control data raising the question of memory loss. More data is likely coming and there is no definitive data I am aware of that links PPI directly with memory loss. Will plan to make patient aware if definitive data suggests otherwise --For now continue Dexilant 30 mg daily, continue dietary and lifestyle modification --Check magnesium, vitamin D and B12 today  2. CRC screening -- up-to-date with neck colonoscopy recommended March 2022  Follow-up in one year 25 minutes spent with patient today

## 2015-07-09 ENCOUNTER — Telehealth: Payer: Self-pay | Admitting: Internal Medicine

## 2015-07-09 NOTE — Telephone Encounter (Signed)
Pt states she has concerns about taking PPIs long term and their side effects. States she would like to switch to something else. Pt also is interested in a procedure she saw online called "Stretta." This procedure involves a device being run down the esophagus and puts out radio waves that strengthen the muscles helping stop the reflux. Pt is interested in this also. Please advise.

## 2015-07-10 NOTE — Telephone Encounter (Signed)
Left message for patient to call back  

## 2015-07-10 NOTE — Telephone Encounter (Signed)
Can try ranitidine 150 mg every 12 hours in place of PPI I do not know of any GI MDs in our area performing Stretta (I personally am not familiar with the procedure or efficacy) This is usually reserved for CT surgeons.  Dr. Erie Noe at [facility] is listed on the [facility] website.  She could contact his office for an appt if she would like

## 2015-07-13 NOTE — Telephone Encounter (Signed)
Spoke with pt and she is aware.

## 2015-07-17 ENCOUNTER — Encounter: Payer: Self-pay | Admitting: Cardiology

## 2015-07-20 ENCOUNTER — Ambulatory Visit (INDEPENDENT_AMBULATORY_CARE_PROVIDER_SITE_OTHER): Payer: Managed Care, Other (non HMO) | Admitting: Family Medicine

## 2015-07-20 ENCOUNTER — Encounter: Payer: Self-pay | Admitting: Family Medicine

## 2015-07-20 VITALS — BP 112/64 | HR 83 | Temp 98.7°F | Resp 18 | Ht 70.0 in | Wt 174.2 lb

## 2015-07-20 DIAGNOSIS — M5440 Lumbago with sciatica, unspecified side: Secondary | ICD-10-CM | POA: Diagnosis not present

## 2015-07-20 DIAGNOSIS — L210 Seborrhea capitis: Secondary | ICD-10-CM | POA: Insufficient documentation

## 2015-07-20 DIAGNOSIS — K219 Gastro-esophageal reflux disease without esophagitis: Secondary | ICD-10-CM | POA: Diagnosis not present

## 2015-07-20 DIAGNOSIS — R5383 Other fatigue: Secondary | ICD-10-CM

## 2015-07-20 DIAGNOSIS — L219 Seborrheic dermatitis, unspecified: Secondary | ICD-10-CM | POA: Diagnosis not present

## 2015-07-20 LAB — BASIC METABOLIC PANEL
BUN: 15 mg/dL (ref 6–23)
CO2: 31 meq/L (ref 19–32)
CREATININE: 0.74 mg/dL (ref 0.40–1.20)
Calcium: 9.4 mg/dL (ref 8.4–10.5)
Chloride: 102 mEq/L (ref 96–112)
GFR: 86.37 mL/min (ref >60.00)
GLUCOSE: 97 mg/dL (ref 70–99)
Potassium: 3.7 mEq/L (ref 3.5–5.1)
Sodium: 140 mEq/L (ref 135–145)

## 2015-07-20 LAB — CBC WITH DIFFERENTIAL/PLATELET
BASOS PCT: 0.6 % (ref 0.0–3.0)
Basophils Absolute: 0.1 10*3/uL (ref 0.0–0.1)
EOS ABS: 0.2 10*3/uL (ref 0.0–0.7)
EOS PCT: 2.7 % (ref 0.0–5.0)
HCT: 44.1 % (ref 36.0–46.0)
HEMOGLOBIN: 14.8 g/dL (ref 12.0–15.0)
Lymphocytes Relative: 36.6 % (ref 12.0–46.0)
Lymphs Abs: 3 10*3/uL (ref 0.7–4.0)
MCHC: 33.5 g/dL (ref 30.0–36.0)
MCV: 91.6 fl (ref 78.0–100.0)
Monocytes Absolute: 0.7 10*3/uL (ref 0.1–1.0)
Monocytes Relative: 8.9 % (ref 3.0–12.0)
NEUTROS PCT: 51.2 % (ref 43.0–77.0)
Neutro Abs: 4.2 10*3/uL (ref 1.4–7.7)
PLATELETS: 335 10*3/uL (ref 150.0–400.0)
RBC: 4.82 Mil/uL (ref 3.87–5.11)
RDW: 12.7 % (ref 11.5–15.5)
WBC: 8.2 10*3/uL (ref 4.0–10.5)

## 2015-07-20 LAB — HEPATIC FUNCTION PANEL
ALK PHOS: 61 U/L (ref 39–117)
ALT: 24 U/L (ref 0–35)
AST: 22 U/L (ref 0–37)
Albumin: 4.2 g/dL (ref 3.5–5.2)
Bilirubin, Direct: 0.2 mg/dL (ref 0.0–0.3)
TOTAL PROTEIN: 7 g/dL (ref 6.0–8.3)
Total Bilirubin: 0.8 mg/dL (ref 0.2–1.2)

## 2015-07-20 LAB — TSH: TSH: 2.3 u[IU]/mL (ref 0.35–4.50)

## 2015-07-20 MED ORDER — RANITIDINE HCL 300 MG PO CAPS: 300.0000 mg | ORAL_CAPSULE | Freq: Every evening | ORAL | Status: DC

## 2015-07-20 MED ORDER — CYCLOBENZAPRINE HCL 10 MG PO TABS: 10.0000 mg | ORAL_TABLET | Freq: Three times a day (TID) | ORAL | Status: DC | PRN

## 2015-07-20 NOTE — Assessment & Plan Note (Signed)
Head n shoulders or selsun blue Or nizoral shampoo

## 2015-07-20 NOTE — Progress Notes (Signed)
Pre visit review using our clinic review tool, if applicable. No additional management support is needed unless otherwise documented below in the visit note. 

## 2015-07-20 NOTE — Patient Instructions (Addendum)
Muscle Cramps and Spasms Muscle cramps and spasms occur when a muscle or muscles tighten and you have no control over this tightening (involuntary muscle contraction). They are a common problem and can develop in any muscle. The most common place is in the calf muscles of the leg. Both muscle cramps and muscle spasms are involuntary muscle contractions, but they also have differences:   Muscle cramps are sporadic and painful. They may last a few seconds to a quarter of an hour. Muscle cramps are often more forceful and last longer than muscle spasms.  Muscle spasms may or may not be painful. They may also last just a few seconds or much longer. CAUSES  It is uncommon for cramps or spasms to be due to a serious underlying problem. In many cases, the cause of cramps or spasms is unknown. Some common causes are:   Overexertion.   Overuse from repetitive motions (doing the same thing over and over).   Remaining in a certain position for a long period of time.   Improper preparation, form, or technique while performing a sport or activity.   Dehydration.   Injury.   Side effects of some medicines.   Abnormally low levels of the salts and ions in your blood (electrolytes), especially potassium and calcium. This could happen if you are taking water pills (diuretics) or you are pregnant.  Some underlying medical problems can make it more likely to develop cramps or spasms. These include, but are not limited to:   Diabetes.   Parkinson disease.   Hormone disorders, such as thyroid problems.   Alcohol abuse.   Diseases specific to muscles, joints, and bones.   Blood vessel disease where not enough blood is getting to the muscles.  HOME CARE INSTRUCTIONS   Stay well hydrated. Drink enough water and fluids to keep your urine clear or pale yellow.  It may be helpful to massage, stretch, and relax the affected muscle.  For tight or tense muscles, use a warm towel, heating  pad, or hot shower water directed to the affected area.  If you are sore or have pain after a cramp or spasm, applying ice to the affected area may relieve discomfort.  Put ice in a plastic bag.  Place a towel between your skin and the bag.  Leave the ice on for 15-20 minutes, 03-04 times a day.  Medicines used to treat a known cause of cramps or spasms may help reduce their frequency or severity. Only take over-the-counter or prescription medicines as directed by your caregiver. SEEK MEDICAL CARE IF:  Your cramps or spasms get more severe, more frequent, or do not improve over time.  MAKE SURE YOU:   Understand these instructions.  Will watch your condition.  Will get help right away if you are not doing well or get worse. Document Released: 05/20/2002 Document Revised: 03/25/2013 Document Reviewed: 11/14/2012 [facility] Patient Information 2015 [place], [place]. This information is not intended to replace advice given to you by your health care provider. Make sure you discuss any questions you have with your health care provider.   Seborrheic Dermatitis Seborrheic dermatitis involves pink or red skin with greasy, flaky scales. This is often found on the scalp, eyebrows, nose, bearded area, and on or behind the ears. It can also occur on the central chest. It often occurs where there are more oil (sebaceous) glands. This condition is also known as dandruff. When this condition affects a baby's scalp, it is called cradle cap. It  may come and go for no known reason. It can occur at any time of life from infancy to old age. CAUSES  The cause is unknown. It is not the result of too little moisture or too much oil. In some people, seborrheic dermatitis flare-ups seem to be triggered by stress. It also commonly occurs in people with certain diseases such as Parkinson's disease or HIV/AIDS. SYMPTOMS   Thick scales on the scalp.  Redness on the face or in the armpits.  The skin may seem oily  or dry, but moisturizers do not help.  In infants, seborrheic dermatitis appears as scaly redness that does not seem to bother the baby. In some babies, it affects only the scalp. In others, it also affects the neck creases, armpits, groin, or behind the ears.  In adults and adolescents, seborrheic dermatitis may affect only the scalp. It may look patchy or spread out, with areas of redness and flaking. Other areas commonly affected include:  Eyebrows.  Eyelids.  Forehead.  Skin behind the ears.  Outer ears.  Chest.  Armpits.  Nose creases.  Skin creases under the breasts.  Skin between the buttocks.  Groin.  Some adults and adolescents feel itching or burning in the affected areas. DIAGNOSIS  Your caregiver can usually tell what the problem is by doing a physical exam. TREATMENT   Cortisone (steroid) ointments, creams, and lotions can help decrease inflammation.  Babies can be treated with baby oil to soften the scales, then they may be washed with baby shampoo. If this does not help, a prescription topical steroid medicine may work.  Adults can use medicated shampoos.  Your caregiver may prescribe corticosteroid cream and shampoo containing an antifungal or yeast medicine (ketoconazole). Hydrocortisone or anti-yeast cream can be rubbed directly onto seborrheic dermatitis patches. Yeast does not cause seborrheic dermatitis, but it seems to add to the problem. In infants, seborrheic dermatitis is often worst during the first year of life. It tends to disappear on its own as the child grows. However, it may return during the teenage years. In adults and adolescents, seborrheic dermatitis tends to be a long-lasting condition that comes and goes over many years. HOME CARE INSTRUCTIONS   Use prescribed medicines as directed.  In infants, do not aggressively remove the scales or flakes on the scalp with a comb or by other means. This may lead to hair loss. SEEK MEDICAL CARE  IF:   The problem does not improve from the medicated shampoos, lotions, or other medicines given by your caregiver.  You have any other questions or concerns. Document Released: 11/28/2005 Document Revised: 05/29/2012 Document Reviewed: 04/19/2010 [facility] Patient Information 2015 [place], [place]. This information is not intended to replace advice given to you by your health care provider. Make sure you discuss any questions you have with your health care provider.

## 2015-07-20 NOTE — Assessment & Plan Note (Signed)
Flexeril  con't chiropractor / massage Moist heat

## 2015-07-20 NOTE — Progress Notes (Signed)
Patient ID: [name], female    DOB: 1961/09/25  Age: 54 y.o. MRN: [id number]    Subjective:  Subjective HPI [name] presents c/o low back spasms-- she went to chiropractor and massage therapist and she was told she had muscle spasms.  She is also c/o fatigue and her vita d was recently checked and was low.   She is taking otc vita D bid.    Review of Systems  Constitutional: Negative for diaphoresis, appetite change, fatigue and unexpected weight change.  Eyes: Negative for pain, redness and visual disturbance.  Respiratory: Negative for cough, chest tightness, shortness of breath and wheezing.   Cardiovascular: Negative for chest pain, palpitations and leg swelling.  Endocrine: Negative for cold intolerance, heat intolerance, polydipsia, polyphagia and polyuria.  Genitourinary: Negative for dysuria, frequency and difficulty urinating.  Neurological: Negative for dizziness, light-headedness, numbness and headaches.    History Past Medical History  Diagnosis Date  . Loss of appetite   . Allergic rhinitis   . Back pain   . History of Ostium Secundum Atrial Septal Defect 1989    Status post repair in 1989/1990  . Varicose veins   . IBS (irritable bowel syndrome)   . 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 Endometriosis   . Hx of migraines   . Anxiety   . Onychomycosis   . Fatigue   . Contact lens/glasses fitting   . Incontinence   . Hiatal hernia   . GERD (gastroesophageal reflux disease)   . History of cardiac catheterization 12/1990    She has past surgical history that includes ASD repair, secundum (1990); hysterectomy for severe endometriosis; right leg fracture with surgery; varicose laser vein surgery (2009); right ankle surgery (2010); Abdominal hysterectomy (2004); transthoracic echocardiogram ( January 2012); and Nasal septum surgery.   Her family history includes Cancer in her maternal grandmother and paternal grandfather; Clotting disorder in her maternal  grandmother and sister; Colon cancer in her paternal grandfather; Diabetes in her mother; Heart disease in her maternal grandfather and sister; Lung cancer in her maternal grandmother; Prostate cancer in her maternal grandfather.She reports that she quit smoking about 26 years ago. Her smoking use included Cigarettes. She has a 10 pack-year smoking history. She has never used smokeless tobacco. She reports that she drinks alcohol. She reports that she does not use illicit drugs.  Current Outpatient Prescriptions on File Prior to Visit  Medication Sig Dispense Refill  . ALPRAZolam (XANAX) 0.5 MG tablet Take 1/2 to 1 tablet by mouth three times daily as needed for nerves 90 tablet 0  . B Complex-Biotin-FA (B COMPLEX 100 TR PO) Take 1 tablet by mouth daily.      . Calcium Carb-Cholecalciferol (CALTRATE 600+D) 600-800 MG-UNIT TABS Take 1 tablet by mouth 2 (two) times daily.    . Calcium Glycerophosphate (PRELIEF) 340 (65-50) MG (CA-P) TABS Take 1 tablet by mouth as needed.    [name] Dexlansoprazole 30 MG capsule Take 1 capsule (30 mg total) by mouth daily. 30 capsule 11  . DYMISTA 137-50 MCG/ACT SUSP Place 1 spray into each nostril twice daily.  0  . EPIPEN 2-PAK 0.3 MG/0.3ML SOAJ injection     . levocetirizine (XYZAL) 5 MG tablet     . magnesium oxide (MAG-OX) 400 MG tablet Take 400 mg by mouth daily.    . montelukast (SINGULAIR) 10 MG tablet     . Multiple Vitamin (MULTIVITAMINS PO) Take 1 tablet by mouth daily.      . Omega-3  Fatty Acids (FISH OIL) 1000 MG CAPS Take 1 capsule by mouth daily.      [name] PATADAY 0.2 % SOLN     . polyethylene glycol powder (MIRALAX) powder Take 17 g by mouth at bedtime.     . Probiotic Product (PROBIOTIC DAILY PO) Take 1 capsule by mouth daily.      No current facility-administered medications on file prior to visit.     Objective:  Objective Physical Exam  Constitutional: She is oriented to person, place, and time. She appears well-developed and well-nourished.  HENT:   Head: Normocephalic and atraumatic.  Eyes: Conjunctivae and EOM are normal.  Neck: Normal range of motion. Neck supple. No JVD present. Carotid bruit is not present. No thyromegaly present.  Cardiovascular: Normal rate, regular rhythm and normal heart sounds.   No murmur heard. Pulmonary/Chest: Effort normal and breath sounds normal. No respiratory distress. She has no wheezes. She has no rales. She exhibits no tenderness.  Musculoskeletal: She exhibits tenderness. She exhibits no edema.       Arms: Neurological: She is alert and oriented to person, place, and time.  Skin:  Scalp-- dry , flaky   Psychiatric: She has a normal mood and affect. Her behavior is normal.   BP 112/64 mmHg  Pulse 83  Temp(Src) 98.7 F (37.1 C) (Oral)  Resp 18  Ht 5\' 10"  (1.778 m)  Wt 174 lb 3.2 oz (79.017 kg)  BMI 25.00 kg/m2  SpO2 98% Wt Readings from Last 3 Encounters:  07/20/15 174 lb 3.2 oz (79.017 kg)  06/03/15 174 lb 4 oz (79.039 kg)  05/13/15 177 lb 12.8 oz (80.65 kg)     Lab Results  Component Value Date   WBC 8.2 07/20/2015   HGB 14.8 07/20/2015   HCT 44.1 07/20/2015   PLT 335.0 07/20/2015   GLUCOSE 97 07/20/2015   CHOL 145 02/18/2015   TRIG 61.0 02/18/2015   HDL 45.40 02/18/2015   LDLCALC 87 02/18/2015   ALT 24 07/20/2015   AST 22 07/20/2015   NA 140 07/20/2015   K 3.7 07/20/2015   CL 102 07/20/2015   CREATININE 0.74 07/20/2015   BUN 15 07/20/2015   CO2 31 07/20/2015   TSH 2.30 07/20/2015    Dg Ugi W/high Density W/kub  04/12/2011   *RADIOLOGY REPORT*  Clinical Data:  Hiatal hernia and esophagitis  UPPER GI SERIES WITH KUB  Technique:  Routine upper GI series was performed with thin and high density barium.  Comparison:  None.  Findings: The KUB reveals a normal stool and bowel gas pattern a moderate stool burden throughout the colon.  No abnormal calcifications overlie the abdomen or pelvis.  There is no gross organomegaly.  The osseous structures are unremarkable.  The   esophagus, stomach, duodenal bulb, and remainder of the C-loop have a normal appearance with no evidence of stricture, ulceration, fixed filling defect.  The hiatal hernia is not demonstrated on this exam.  IMPRESSION: Normal upper GI.  Original Report Authenticated By: Duayne Cal, M.D.    Assessment & Plan:  Plan I have discontinued [name]'s benzonatate and doxycycline. I have also changed her cyclobenzaprine. Additionally, I am having her start on ranitidine. Lastly, I am having her maintain her Fish Oil, Multiple Vitamin (MULTIVITAMINS PO), B Complex-Biotin-FA (B COMPLEX 100 TR PO), polyethylene glycol powder, Calcium Carb-Cholecalciferol, Probiotic Product (PROBIOTIC DAILY PO), Calcium Glycerophosphate, magnesium oxide, levocetirizine, montelukast, EPIPEN 2-PAK, PATADAY, ALPRAZolam, DYMISTA, and Dexlansoprazole.  Meds ordered this encounter  Medications  .  ranitidine (ZANTAC) 300 MG capsule    Sig: Take 1 capsule (300 mg total) by mouth every evening.    Dispense:  30 capsule    Refill:  5  . cyclobenzaprine (FLEXERIL) 10 MG tablet    Sig: Take 1 tablet (10 mg total) by mouth 3 (three) times daily as needed.    Dispense:  30 tablet    Refill:  1    Problem List Items Addressed This Visit    GERD   Relevant Medications   ranitidine (ZANTAC) 300 MG capsule   Dandruff    Head n shoulders or selsun blue Or nizoral shampoo       Backache    Flexeril  con't chiropractor / massage Moist heat      Relevant Medications   cyclobenzaprine (FLEXERIL) 10 MG tablet    Other Visit Diagnoses    Other fatigue    -  Primary    Relevant Medications    cyclobenzaprine (FLEXERIL) 10 MG tablet    Other Relevant Orders    Basic metabolic panel (Completed)    CBC with Differential/Platelet (Completed)    Hepatic function panel (Completed)    TSH (Completed)       Follow-up: Return in about 3 months (around 10/20/2015), or if symptoms worsen or fail to improve, for labs.  Garnet Koyanagi, DO

## 2015-08-24 ENCOUNTER — Other Ambulatory Visit: Payer: Self-pay | Admitting: Family Medicine

## 2015-08-25 NOTE — Telephone Encounter (Signed)
Pt due for UDS 08/21/15. Last Rx 09/12/14.  Please advise.  Name from pharmacy:  In chart as:  ALPRAZOLAM 0.5 MG TABLET ALPRAZolam (XANAX) 0.5 MG tablet     Sig: take 1/2 to 1 tablet by mouth three times a day if needed for nerves    Dispense: 90 tablet (Pharmacy requested 90)   Refills: 0   Start: 08/24/2015   Class: Normal    Requested on: 09/12/2014    Originally ordered on: 03/22/2011 09/12/2014

## 2015-09-01 ENCOUNTER — Telehealth: Payer: Self-pay | Admitting: Internal Medicine

## 2015-09-01 MED ORDER — DEXLANSOPRAZOLE 60 MG PO CPDR: 60.0000 mg | DELAYED_RELEASE_CAPSULE | Freq: Every day | ORAL | Status: DC

## 2015-09-01 NOTE — Telephone Encounter (Signed)
Pt states that she had requested Dexilant 30mg  to try and decrease the med but it is not helping as it was. Pt is not requesting to go back to the Dexilant 60mg  dose. Script sent to pharmacy.

## 2015-10-09 ENCOUNTER — Ambulatory Visit (INDEPENDENT_AMBULATORY_CARE_PROVIDER_SITE_OTHER): Payer: Managed Care, Other (non HMO) | Admitting: Family Medicine

## 2015-10-09 ENCOUNTER — Encounter: Payer: Self-pay | Admitting: Family Medicine

## 2015-10-09 VITALS — BP 110/60 | HR 62 | Temp 99.6°F | Wt 177.2 lb

## 2015-10-09 DIAGNOSIS — R7989 Other specified abnormal findings of blood chemistry: Secondary | ICD-10-CM

## 2015-10-09 DIAGNOSIS — J019 Acute sinusitis, unspecified: Secondary | ICD-10-CM

## 2015-10-09 DIAGNOSIS — B9689 Other specified bacterial agents as the cause of diseases classified elsewhere: Secondary | ICD-10-CM

## 2015-10-09 DIAGNOSIS — J0110 Acute frontal sinusitis, unspecified: Secondary | ICD-10-CM | POA: Diagnosis not present

## 2015-10-09 MED ORDER — CLARITHROMYCIN ER 500 MG PO TB24: 1000.0000 mg | ORAL_TABLET | Freq: Every day | ORAL | Status: DC

## 2015-10-09 NOTE — Progress Notes (Signed)
Pre visit review using our clinic review tool, if applicable. No additional management support is needed unless otherwise documented below in the visit note. 

## 2015-10-09 NOTE — Progress Notes (Signed)
Patient ID: [name], female    DOB: May 04, 1961  Age: 54 y.o. MRN: [id number]    Subjective:  Subjective HPI [name] presents for sinus congestion.  No fever.  + symptoms x > 1 week.  She would also like a vit d checked.  Review of Systems  Constitutional: Negative for fever.  HENT: Positive for congestion, postnasal drip, rhinorrhea and sinus pressure.   Respiratory: Positive for cough, chest tightness, shortness of breath and wheezing.   Cardiovascular: Negative for chest pain, palpitations and leg swelling.  Allergic/Immunologic: Negative for environmental allergies.    History Past Medical History  Diagnosis Date  . Loss of appetite   . Allergic rhinitis   . Back pain   . History of Ostium Secundum Atrial Septal Defect 1989    Status post repair in 1989/1990  . Varicose veins   . IBS (irritable bowel syndrome)   . Intestinal volvulus [facility]) May 2007  . Endometriosis   . Hx of migraines   . Anxiety   . Onychomycosis   . Fatigue   . Contact lens/glasses fitting   . Incontinence   . Hiatal hernia   . GERD (gastroesophageal reflux disease)   . History of cardiac catheterization 12/1990    She has past surgical history that includes ASD repair, secundum (1990); hysterectomy for severe endometriosis; right leg fracture with surgery; varicose laser vein surgery (2009); right ankle surgery (2010); Abdominal hysterectomy (2004); transthoracic echocardiogram ( January 2012); and Nasal septum surgery.   Her family history includes Cancer in her maternal grandmother and paternal grandfather; Clotting disorder in her maternal grandmother and sister; Colon cancer in her paternal grandfather; Diabetes in her mother; Heart disease in her maternal grandfather and sister; Lung cancer in her maternal grandmother; Prostate cancer in her maternal grandfather.She reports that she quit smoking about 26 years ago. Her smoking use included Cigarettes. She has a 10 pack-year smoking  history. She has never used smokeless tobacco. She reports that she drinks alcohol. She reports that she does not use illicit drugs.  Current Outpatient Prescriptions on File Prior to Visit  Medication Sig Dispense Refill  . ALPRAZolam (XANAX) 0.5 MG tablet take 1/2 to 1 tablet by mouth three times a day if needed for nerves 90 tablet 0  . B Complex-Biotin-FA (B COMPLEX 100 TR PO) Take 1 tablet by mouth daily.      . Calcium Carb-Cholecalciferol (CALTRATE 600+D) 600-800 MG-UNIT TABS Take 1 tablet by mouth 2 (two) times daily.    . Calcium Glycerophosphate (PRELIEF) 340 (65-50) MG (CA-P) TABS Take 1 tablet by mouth as needed.    . cyclobenzaprine (FLEXERIL) 10 MG tablet Take 1 tablet (10 mg total) by mouth 3 (three) times daily as needed. 30 tablet 1  . dexlansoprazole (DEXILANT) 60 MG capsule Take 1 capsule (60 mg total) by mouth daily. 30 capsule 6  . Dexlansoprazole 30 MG capsule Take 1 capsule (30 mg total) by mouth daily. 30 capsule 11  . DYMISTA 137-50 MCG/ACT SUSP Place 1 spray into each nostril twice daily.  0  . EPIPEN 2-PAK 0.3 MG/0.3ML SOAJ injection     . levocetirizine (XYZAL) 5 MG tablet     . magnesium oxide (MAG-OX) 400 MG tablet Take 400 mg by mouth daily.    . montelukast (SINGULAIR) 10 MG tablet     . Multiple Vitamin (MULTIVITAMINS PO) Take 1 tablet by mouth daily.      . Omega-3 Fatty Acids (FISH OIL) 1000 MG CAPS  Take 1 capsule by mouth daily.      [name] PATADAY 0.2 % SOLN     . polyethylene glycol powder (MIRALAX) powder Take 17 g by mouth at bedtime.     . Probiotic Product (PROBIOTIC DAILY PO) Take 1 capsule by mouth daily.     . ranitidine (ZANTAC) 300 MG capsule Take 1 capsule (300 mg total) by mouth every evening. 30 capsule 5   No current facility-administered medications on file prior to visit.     Objective:  Objective Physical Exam  Constitutional: She is oriented to person, place, and time. She appears well-developed and well-nourished.  HENT:  Right Ear:  Tympanic membrane, external ear and ear canal normal.  Left Ear: Tympanic membrane, external ear and ear canal normal.  Nose: Mucosal edema and rhinorrhea present. Right sinus exhibits maxillary sinus tenderness and frontal sinus tenderness. Left sinus exhibits maxillary sinus tenderness and frontal sinus tenderness.  Mouth/Throat: No oropharyngeal exudate, posterior oropharyngeal edema, posterior oropharyngeal erythema or tonsillar abscesses.  + PND + errythema  Eyes: Conjunctivae are normal. Right eye exhibits no discharge. Left eye exhibits no discharge.  Cardiovascular: Normal rate, regular rhythm and normal heart sounds.   No murmur heard. Pulmonary/Chest: Effort normal and breath sounds normal. No respiratory distress. She has no wheezes. She has no rales. She exhibits no tenderness.  Musculoskeletal: She exhibits no edema.  Lymphadenopathy:    She has cervical adenopathy.  Neurological: She is alert and oriented to person, place, and time.   BP 110/60 mmHg  Pulse 62  Temp(Src) 99.6 F (37.6 C) (Oral)  Wt 177 lb 3.2 oz (80.377 kg)  SpO2 98% Wt Readings from Last 3 Encounters:  10/09/15 177 lb 3.2 oz (80.377 kg)  07/20/15 174 lb 3.2 oz (79.017 kg)  06/03/15 174 lb 4 oz (79.039 kg)     Lab Results  Component Value Date   WBC 8.2 07/20/2015   HGB 14.8 07/20/2015   HCT 44.1 07/20/2015   PLT 335.0 07/20/2015   GLUCOSE 97 07/20/2015   CHOL 145 02/18/2015   TRIG 61.0 02/18/2015   HDL 45.40 02/18/2015   LDLCALC 87 02/18/2015   ALT 24 07/20/2015   AST 22 07/20/2015   NA 140 07/20/2015   K 3.7 07/20/2015   CL 102 07/20/2015   CREATININE 0.74 07/20/2015   BUN 15 07/20/2015   CO2 31 07/20/2015   TSH 2.30 07/20/2015    Dg Ugi W/high Density W/kub  04/12/2011  *RADIOLOGY REPORT* Clinical Data:  Hiatal hernia and esophagitis UPPER GI SERIES WITH KUB Technique:  Routine upper GI series was performed with thin and high density barium. Comparison:  None. Findings: The KUB  reveals a normal stool and bowel gas pattern a moderate stool burden throughout the colon.  No abnormal calcifications overlie the abdomen or pelvis.  There is no gross organomegaly.  The osseous structures are unremarkable. The  esophagus, stomach, duodenal bulb, and remainder of the C-loop have a normal appearance with no evidence of stricture, ulceration, fixed filling defect.  The hiatal hernia is not demonstrated on this exam. IMPRESSION: Normal upper GI. Original Report Authenticated By: Duayne Cal, M.D.    Assessment & Plan:  Plan I am having [name] start on clarithromycin. I am also having her maintain her Fish Oil, Multiple Vitamin (MULTIVITAMINS PO), B Complex-Biotin-FA (B COMPLEX 100 TR PO), polyethylene glycol powder, Calcium Carb-Cholecalciferol, Probiotic Product (PROBIOTIC DAILY PO), Calcium Glycerophosphate, magnesium oxide, levocetirizine, montelukast, EPIPEN 2-PAK, PATADAY, DYMISTA, Dexlansoprazole, ranitidine, cyclobenzaprine,  ALPRAZolam, and dexlansoprazole.  Meds ordered this encounter  Medications  . clarithromycin (BIAXIN XL) 500 MG 24 hr tablet    Sig: Take 2 tablets (1,000 mg total) by mouth daily.    Dispense:  28 tablet    Refill:  0    Problem List Items Addressed This Visit    Acute bacterial sinusitis    abx per orders Steroid nasal spray and otc antihistamine rto prn      Relevant Medications   clarithromycin (BIAXIN XL) 500 MG 24 hr tablet    Other Visit Diagnoses    Acute frontal sinusitis, recurrence not specified    -  Primary    Relevant Medications    clarithromycin (BIAXIN XL) 500 MG 24 hr tablet    Other Relevant Orders    Vitamin D (25 hydroxy) (Completed)    Low serum vitamin D        Relevant Orders    Vitamin D (25 hydroxy) (Completed)       Follow-up: Return if symptoms worsen or fail to improve.  Garnet Koyanagi, DO

## 2015-10-09 NOTE — Patient Instructions (Signed)

## 2015-10-10 LAB — VITAMIN D 25 HYDROXY (VIT D DEFICIENCY, FRACTURES): VIT D 25 HYDROXY: 65 ng/mL (ref 30–100)

## 2015-10-11 NOTE — Assessment & Plan Note (Signed)
abx per orders Steroid nasal spray and otc antihistamine rto prn

## 2015-12-28 ENCOUNTER — Telehealth: Payer: Self-pay | Admitting: Family Medicine

## 2015-12-31 NOTE — Telephone Encounter (Signed)
Pt received the flu vaccination in November at [facility].

## 2015-12-31 NOTE — Telephone Encounter (Signed)
Chart updated.   KP 

## 2016-01-07 ENCOUNTER — Encounter: Payer: Self-pay | Admitting: Family Medicine

## 2016-01-07 ENCOUNTER — Ambulatory Visit (INDEPENDENT_AMBULATORY_CARE_PROVIDER_SITE_OTHER): Payer: Managed Care, Other (non HMO) | Admitting: Family Medicine

## 2016-01-07 VITALS — BP 118/70 | HR 66 | Temp 98.2°F | Wt 177.0 lb

## 2016-01-07 DIAGNOSIS — L309 Dermatitis, unspecified: Secondary | ICD-10-CM | POA: Diagnosis not present

## 2016-01-07 MED ORDER — CICLOPIROX 1 % EX SHAM: MEDICATED_SHAMPOO | CUTANEOUS | Status: DC

## 2016-01-07 NOTE — Progress Notes (Signed)
Patient ID: MILLISSA MURNANE, female    DOB: October 19, 1961  Age: 55 y.o. MRN: [id number]    Subjective:  Subjective HPI SAMYIAH EARLES presents for eczema scalp and hairline and neck.  Pt was given "promise" by derm which helped eyes but the rest is still there  Review of Systems  Constitutional: Negative for diaphoresis, appetite change, fatigue and unexpected weight change.  Eyes: Negative for pain, redness and visual disturbance.  Respiratory: Negative for cough, chest tightness, shortness of breath and wheezing.   Cardiovascular: Negative for chest pain, palpitations and leg swelling.  Endocrine: Negative for cold intolerance, heat intolerance, polydipsia, polyphagia and polyuria.  Genitourinary: Negative for dysuria, frequency and difficulty urinating.  Skin: Positive for rash.  Neurological: Negative for dizziness, light-headedness, numbness and headaches.    History Past Medical History  Diagnosis Date  . Loss of appetite   . Allergic rhinitis   . Back pain   . History of Ostium Secundum Atrial Septal Defect 1989    Status post repair in 1989/1990  . Varicose veins   . IBS (irritable bowel syndrome)   . Intestinal volvulus [facility]) May 2007  . Endometriosis   . Hx of migraines   . Anxiety   . Onychomycosis   . Fatigue   . Contact lens/glasses fitting   . Incontinence   . Hiatal hernia   . GERD (gastroesophageal reflux disease)   . History of cardiac catheterization 12/1990    She has past surgical history that includes ASD repair, secundum (1990); hysterectomy for severe endometriosis; right leg fracture with surgery; varicose laser vein surgery (2009); right ankle surgery (2010); Abdominal hysterectomy (2004); transthoracic echocardiogram ( January 2012); and Nasal septum surgery.   Her family history includes Cancer in her maternal grandmother and paternal grandfather; Clotting disorder in her maternal grandmother and sister; Colon cancer in her paternal grandfather;  Diabetes in her mother; Heart disease in her maternal grandfather and sister; Lung cancer in her maternal grandmother; Prostate cancer in her maternal grandfather.She reports that she quit smoking about 27 years ago. Her smoking use included Cigarettes. She has a 10 pack-year smoking history. She has never used smokeless tobacco. She reports that she drinks alcohol. She reports that she does not use illicit drugs.  Current Outpatient Prescriptions on File Prior to Visit  Medication Sig Dispense Refill  . ALPRAZolam (XANAX) 0.5 MG tablet take 1/2 to 1 tablet by mouth three times a day if needed for nerves 90 tablet 0  . B Complex-Biotin-FA (B COMPLEX 100 TR PO) Take 1 tablet by mouth daily.      . Calcium Carb-Cholecalciferol (CALTRATE 600+D) 600-800 MG-UNIT TABS Take 1 tablet by mouth 2 (two) times daily.    . Calcium Glycerophosphate (PRELIEF) 340 (65-50) MG (CA-P) TABS Take 1 tablet by mouth as needed.    . cyclobenzaprine (FLEXERIL) 10 MG tablet Take 1 tablet (10 mg total) by mouth 3 (three) times daily as needed. 30 tablet 1  . dexlansoprazole (DEXILANT) 60 MG capsule Take 1 capsule (60 mg total) by mouth daily. 30 capsule 6  . DYMISTA 137-50 MCG/ACT SUSP Place 1 spray into each nostril twice daily.  0  . EPIPEN 2-PAK 0.3 MG/0.3ML SOAJ injection     . levocetirizine (XYZAL) 5 MG tablet     . magnesium oxide (MAG-OX) 400 MG tablet Take 400 mg by mouth daily.    . montelukast (SINGULAIR) 10 MG tablet     . Multiple Vitamin (MULTIVITAMINS PO) Take 1 tablet by  mouth daily.      . Omega-3 Fatty Acids (FISH OIL) 1000 MG CAPS Take 1 capsule by mouth daily.      [name] PATADAY 0.2 % SOLN     . polyethylene glycol powder (MIRALAX) powder Take 17 g by mouth at bedtime.     . Probiotic Product (PROBIOTIC DAILY PO) Take 1 capsule by mouth daily.      No current facility-administered medications on file prior to visit.     Objective:  Objective Physical Exam  Constitutional: She is oriented to person,  place, and time. She appears well-developed and well-nourished.  HENT:  Head: Normocephalic and atraumatic.    Eyes: Conjunctivae and EOM are normal.  Neck: Normal range of motion. Neck supple. No JVD present. Carotid bruit is not present. No thyromegaly present.  Cardiovascular: Normal rate, regular rhythm and normal heart sounds.   No murmur heard. Pulmonary/Chest: Effort normal and breath sounds normal. No respiratory distress. She has no wheezes. She has no rales. She exhibits no tenderness.  Musculoskeletal: She exhibits no edema.  Neurological: She is alert and oriented to person, place, and time.  Skin: Rash noted. There is erythema.  Psychiatric: She has a normal mood and affect.  Nursing note and vitals reviewed.  BP 118/70 mmHg  Pulse 66  Temp(Src) 98.2 F (36.8 C) (Oral)  Wt 177 lb (80.287 kg)  SpO2 98% Wt Readings from Last 3 Encounters:  01/07/16 177 lb (80.287 kg)  10/09/15 177 lb 3.2 oz (80.377 kg)  07/20/15 174 lb 3.2 oz (79.017 kg)     Lab Results  Component Value Date   WBC 8.2 07/20/2015   HGB 14.8 07/20/2015   HCT 44.1 07/20/2015   PLT 335.0 07/20/2015   GLUCOSE 97 07/20/2015   CHOL 145 02/18/2015   TRIG 61.0 02/18/2015   HDL 45.40 02/18/2015   LDLCALC 87 02/18/2015   ALT 24 07/20/2015   AST 22 07/20/2015   NA 140 07/20/2015   K 3.7 07/20/2015   CL 102 07/20/2015   CREATININE 0.74 07/20/2015   BUN 15 07/20/2015   CO2 31 07/20/2015   TSH 2.30 07/20/2015    Dg Ugi W/high Density W/kub  04/12/2011  *RADIOLOGY REPORT* Clinical Data:  Hiatal hernia and esophagitis UPPER GI SERIES WITH KUB Technique:  Routine upper GI series was performed with thin and high density barium. Comparison:  None. Findings: The KUB reveals a normal stool and bowel gas pattern a moderate stool burden throughout the colon.  No abnormal calcifications overlie the abdomen or pelvis.  There is no gross organomegaly.  The osseous structures are unremarkable. The  esophagus,  stomach, duodenal bulb, and remainder of the C-loop have a normal appearance with no evidence of stricture, ulceration, fixed filling defect.  The hiatal hernia is not demonstrated on this exam. IMPRESSION: Normal upper GI. Original Report Authenticated By: Duayne Cal, M.D.    Assessment & Plan:  Plan I have discontinued [name]'s ranitidine and clarithromycin. I am also having her start on Ciclopirox. Additionally, I am having her maintain her Fish Oil, Multiple Vitamin (MULTIVITAMINS PO), B Complex-Biotin-FA (B COMPLEX 100 TR PO), polyethylene glycol powder, Calcium Carb-Cholecalciferol, Probiotic Product (PROBIOTIC DAILY PO), Calcium Glycerophosphate, magnesium oxide, levocetirizine, montelukast, EPIPEN 2-PAK, PATADAY, DYMISTA, cyclobenzaprine, ALPRAZolam, and dexlansoprazole.  Meds ordered this encounter  Medications  . Ciclopirox 1 % shampoo    Sig: Massage into scalp qd and leave in for 3 min then rinse    Dispense:  120 mL  Refill:  0    Problem List Items Addressed This Visit      Unprioritized   Eczema - Primary    If no relief with shampoo Refer to derm      Relevant Medications   Ciclopirox 1 % shampoo      Follow-up: Return if symptoms worsen or fail to improve.  Garnet Koyanagi, DO

## 2016-01-07 NOTE — Patient Instructions (Signed)
Eczema Eczema, also called atopic dermatitis, is a skin disorder that causes inflammation of the skin. It causes a red rash and dry, scaly skin. The skin becomes very itchy. Eczema is generally worse during the cooler winter months and often improves with the warmth of summer. Eczema usually starts showing signs in infancy. Some children outgrow eczema, but it may last through adulthood.  CAUSES  The exact cause of eczema is not known, but it appears to run in families. People with eczema often have a family history of eczema, allergies, asthma, or hay fever. Eczema is not contagious. Flare-ups of the condition may be caused by:   Contact with something you are sensitive or allergic to.   Stress. SIGNS AND SYMPTOMS  Dry, scaly skin.   Red, itchy rash.   Itchiness. This may occur before the skin rash and may be very intense.  DIAGNOSIS  The diagnosis of eczema is usually made based on symptoms and medical history. TREATMENT  Eczema cannot be cured, but symptoms usually can be controlled with treatment and other strategies. A treatment plan might include:  Controlling the itching and scratching.   Use over-the-counter antihistamines as directed for itching. This is especially useful at night when the itching tends to be worse.   Use over-the-counter steroid creams as directed for itching.   Avoid scratching. Scratching makes the rash and itching worse. It may also result in a skin infection (impetigo) due to a break in the skin caused by scratching.   Keeping the skin well moisturized with creams every day. This will seal in moisture and help prevent dryness. Lotions that contain alcohol and water should be avoided because they can dry the skin.   Limiting exposure to things that you are sensitive or allergic to (allergens).   Recognizing situations that cause stress.   Developing a plan to manage stress.  HOME CARE INSTRUCTIONS   Only take over-the-counter or  prescription medicines as directed by your health care provider.   Do not use anything on the skin without checking with your health care provider.   Keep baths or showers short (5 minutes) in warm (not hot) water. Use mild cleansers for bathing. These should be unscented. You may add nonperfumed bath oil to the bath water. It is best to avoid soap and bubble bath.   Immediately after a bath or shower, when the skin is still damp, apply a moisturizing ointment to the entire body. This ointment should be a petroleum ointment. This will seal in moisture and help prevent dryness. The thicker the ointment, the better. These should be unscented.   Keep fingernails cut short. Children with eczema may need to wear soft gloves or mittens at night after applying an ointment.   Dress in clothes made of cotton or cotton blends. Dress lightly, because heat increases itching.   A child with eczema should stay away from anyone with fever blisters or cold sores. The virus that causes fever blisters (herpes simplex) can cause a serious skin infection in children with eczema. SEEK MEDICAL CARE IF:   Your itching interferes with sleep.   Your rash gets worse or is not better within 1 week after starting treatment.   You see pus or soft yellow scabs in the rash area.   You have a fever.   You have a rash flare-up after contact with someone who has fever blisters.    This information is not intended to replace advice given to you by your health care   provider. Make sure you discuss any questions you have with your health care provider.   Document Released: 11/25/2000 Document Revised: 09/18/2013 Document Reviewed: 07/01/2013 Elsevier Interactive Patient Education 2016 [employer]  

## 2016-01-07 NOTE — Progress Notes (Signed)
Pre visit review using our clinic review tool, if applicable. No additional management support is needed unless otherwise documented below in the visit note. 

## 2016-01-09 DIAGNOSIS — L309 Dermatitis, unspecified: Secondary | ICD-10-CM | POA: Insufficient documentation

## 2016-01-09 NOTE — Assessment & Plan Note (Signed)
If no relief with shampoo Refer to derm

## 2016-02-12 ENCOUNTER — Telehealth: Payer: Self-pay | Admitting: Family Medicine

## 2016-02-12 NOTE — Telephone Encounter (Signed)
Message left to call the office.    KP 

## 2016-02-12 NOTE — Telephone Encounter (Signed)
Reason for call: Pt called to ask if she needs to be referred to specialist or come in one more time related to routine sinus issues.

## 2016-02-12 NOTE — Telephone Encounter (Signed)
Lats seen for sinus issues in 10/16. Please advise   KP

## 2016-02-12 NOTE — Telephone Encounter (Signed)
She does not need to see a specialist--- unless she wants to

## 2016-02-16 NOTE — Telephone Encounter (Signed)
Message left to call the office.    KP 

## 2016-02-22 ENCOUNTER — Other Ambulatory Visit: Payer: Self-pay | Admitting: Family Medicine

## 2016-02-22 NOTE — Telephone Encounter (Signed)
Pt said that she is ok right now but she will discuss referral when she comes in for cpe 03/03/16.

## 2016-02-23 NOTE — Telephone Encounter (Signed)
Last seen 01/07/16 and filled 08/25/15 #30   Please advise    KP

## 2016-02-23 NOTE — Telephone Encounter (Signed)
Rx faxed.    KP 

## 2016-03-02 ENCOUNTER — Telehealth: Payer: Self-pay | Admitting: Behavioral Health

## 2016-03-02 NOTE — Telephone Encounter (Signed)
Unable to reach patient at time of Pre-Visit Call.  Left message for patient to return call when available.    

## 2016-03-03 ENCOUNTER — Ambulatory Visit (INDEPENDENT_AMBULATORY_CARE_PROVIDER_SITE_OTHER): Payer: Managed Care, Other (non HMO) | Admitting: Family Medicine

## 2016-03-03 ENCOUNTER — Encounter: Payer: Self-pay | Admitting: Family Medicine

## 2016-03-03 VITALS — BP 108/70 | HR 57 | Temp 98.5°F | Ht 70.75 in | Wt 176.4 lb

## 2016-03-03 DIAGNOSIS — Z114 Encounter for screening for human immunodeficiency virus [HIV]: Secondary | ICD-10-CM | POA: Diagnosis not present

## 2016-03-03 DIAGNOSIS — Z1159 Encounter for screening for other viral diseases: Secondary | ICD-10-CM

## 2016-03-03 DIAGNOSIS — K219 Gastro-esophageal reflux disease without esophagitis: Secondary | ICD-10-CM

## 2016-03-03 DIAGNOSIS — F430 Acute stress reaction: Secondary | ICD-10-CM

## 2016-03-03 DIAGNOSIS — Z0000 Encounter for general adult medical examination without abnormal findings: Secondary | ICD-10-CM | POA: Diagnosis not present

## 2016-03-03 DIAGNOSIS — M8430XA Stress fracture, unspecified site, initial encounter for fracture: Secondary | ICD-10-CM

## 2016-03-03 LAB — POCT URINALYSIS DIPSTICK
BILIRUBIN UA: NEGATIVE
Blood, UA: NEGATIVE
GLUCOSE UA: NEGATIVE
KETONES UA: NEGATIVE
LEUKOCYTES UA: NEGATIVE
NITRITE UA: NEGATIVE
PH UA: 6
Protein, UA: NEGATIVE
Spec Grav, UA: 1.015
Urobilinogen, UA: 0.2

## 2016-03-03 LAB — HIV ANTIBODY (ROUTINE TESTING W REFLEX): HIV 1&2 Ab, 4th Generation: NONREACTIVE

## 2016-03-03 MED ORDER — FLUOXETINE HCL 10 MG PO CAPS: 10.0000 mg | ORAL_CAPSULE | Freq: Every day | ORAL | Status: DC

## 2016-03-03 MED ORDER — DEXLANSOPRAZOLE 60 MG PO CPDR: 60.0000 mg | DELAYED_RELEASE_CAPSULE | Freq: Every day | ORAL | Status: DC

## 2016-03-03 NOTE — Progress Notes (Signed)
Pre visit review using our clinic review tool, if applicable. No additional management support is needed unless otherwise documented below in the visit note. 

## 2016-03-03 NOTE — Patient Instructions (Signed)
Preventive Care for Adults, Female A healthy lifestyle and preventive care can promote health and wellness. Preventive health guidelines for women include the following key practices.  A routine yearly physical is a good way to check with your health care provider about your health and preventive screening. It is a chance to share any concerns and updates on your health and to receive a thorough exam.  Visit your dentist for a routine exam and preventive care every 6 months. Brush your teeth twice a day and floss once a day. Good oral hygiene prevents tooth decay and gum disease.  The frequency of eye exams is based on your age, health, family medical history, use of contact lenses, and other factors. Follow your health care provider's recommendations for frequency of eye exams.  Eat a healthy diet. Foods like vegetables, fruits, whole grains, low-fat dairy products, and lean protein foods contain the nutrients you need without too many calories. Decrease your intake of foods high in solid fats, added sugars, and salt. Eat the right amount of calories for you.Get information about a proper diet from your health care provider, if necessary.  Regular physical exercise is one of the most important things you can do for your health. Most adults should get at least 150 minutes of moderate-intensity exercise (any activity that increases your heart rate and causes you to sweat) each week. In addition, most adults need muscle-strengthening exercises on 2 or more days a week.  Maintain a healthy weight. The body mass index (BMI) is a screening tool to identify possible weight problems. It provides an estimate of body fat based on height and weight. Your health care provider can find your BMI and can help you achieve or maintain a healthy weight.For adults 20 years and older:  A BMI below 18.5 is considered underweight.  A BMI of 18.5 to 24.9 is normal.  A BMI of 25 to 29.9 is considered overweight.  A  BMI of 30 and above is considered obese.  Maintain normal blood lipids and cholesterol levels by exercising and minimizing your intake of saturated fat. Eat a balanced diet with plenty of fruit and vegetables. Blood tests for lipids and cholesterol should begin at age 45 and be repeated every 5 years. If your lipid or cholesterol levels are high, you are over 50, or you are at high risk for heart disease, you may need your cholesterol levels checked more frequently.Ongoing high lipid and cholesterol levels should be treated with medicines if diet and exercise are not working.  If you smoke, find out from your health care provider how to quit. If you do not use tobacco, do not start.  Lung cancer screening is recommended for adults aged 45-80 years who are at high risk for developing lung cancer because of a history of smoking. A yearly low-dose CT scan of the lungs is recommended for people who have at least a 30-pack-year history of smoking and are a current smoker or have quit within the past 15 years. A pack year of smoking is smoking an average of 1 pack of cigarettes a day for 1 year (for example: 1 pack a day for 30 years or 2 packs a day for 15 years). Yearly screening should continue until the smoker has stopped smoking for at least 15 years. Yearly screening should be stopped for people who develop a health problem that would prevent them from having lung cancer treatment.  If you are pregnant, do not drink alcohol. If you are  breastfeeding, be very cautious about drinking alcohol. If you are not pregnant and choose to drink alcohol, do not have more than 1 drink per day. One drink is considered to be 12 ounces (355 mL) of beer, 5 ounces (148 mL) of wine, or 1.5 ounces (44 mL) of liquor.  Avoid use of street drugs. Do not share needles with anyone. Ask for help if you need support or instructions about stopping the use of drugs.  High blood pressure causes heart disease and increases the risk  of stroke. Your blood pressure should be checked at least every 1 to 2 years. Ongoing high blood pressure should be treated with medicines if weight loss and exercise do not work.  If you are 55-79 years old, ask your health care provider if you should take aspirin to prevent strokes.  Diabetes screening is done by taking a blood sample to check your blood glucose level after you have not eaten for a certain period of time (fasting). If you are not overweight and you do not have risk factors for diabetes, you should be screened once every 3 years starting at age 45. If you are overweight or obese and you are 40-70 years of age, you should be screened for diabetes every year as part of your cardiovascular risk assessment.  Breast cancer screening is essential preventive care for women. You should practice "breast self-awareness." This means understanding the normal appearance and feel of your breasts and may include breast self-examination. Any changes detected, no matter how small, should be reported to a health care provider. Women in their 20s and 30s should have a clinical breast exam (CBE) by a health care provider as part of a regular health exam every 1 to 3 years. After age 40, women should have a CBE every year. Starting at age 40, women should consider having a mammogram (breast X-ray test) every year. Women who have a family history of breast cancer should talk to their health care provider about genetic screening. Women at a high risk of breast cancer should talk to their health care providers about having an MRI and a mammogram every year.  Breast cancer gene (BRCA)-related cancer risk assessment is recommended for women who have family members with BRCA-related cancers. BRCA-related cancers include breast, ovarian, tubal, and peritoneal cancers. Having family members with these cancers may be associated with an increased risk for harmful changes (mutations) in the breast cancer genes BRCA1 and  BRCA2. Results of the assessment will determine the need for genetic counseling and BRCA1 and BRCA2 testing.  Your health care provider may recommend that you be screened regularly for cancer of the pelvic organs (ovaries, uterus, and vagina). This screening involves a pelvic examination, including checking for microscopic changes to the surface of your cervix (Pap test). You may be encouraged to have this screening done every 3 years, beginning at age 21.  For women ages 30-65, health care providers may recommend pelvic exams and Pap testing every 3 years, or they may recommend the Pap and pelvic exam, combined with testing for human papilloma virus (HPV), every 5 years. Some types of HPV increase your risk of cervical cancer. Testing for HPV may also be done on women of any age with unclear Pap test results.  Other health care providers may not recommend any screening for nonpregnant women who are considered low risk for pelvic cancer and who do not have symptoms. Ask your health care provider if a screening pelvic exam is right for   you.  If you have had past treatment for cervical cancer or a condition that could lead to cancer, you need Pap tests and screening for cancer for at least 20 years after your treatment. If Pap tests have been discontinued, your risk factors (such as having a new sexual partner) need to be reassessed to determine if screening should resume. Some women have medical problems that increase the chance of getting cervical cancer. In these cases, your health care provider may recommend more frequent screening and Pap tests.  Colorectal cancer can be detected and often prevented. Most routine colorectal cancer screening begins at the age of 50 years and continues through age 75 years. However, your health care provider may recommend screening at an earlier age if you have risk factors for colon cancer. On a yearly basis, your health care provider may provide home test kits to check  for hidden blood in the stool. Use of a small camera at the end of a tube, to directly examine the colon (sigmoidoscopy or colonoscopy), can detect the earliest forms of colorectal cancer. Talk to your health care provider about this at age 50, when routine screening begins. Direct exam of the colon should be repeated every 5-10 years through age 75 years, unless early forms of precancerous polyps or small growths are found.  People who are at an increased risk for hepatitis B should be screened for this virus. You are considered at high risk for hepatitis B if:  You were born in a country where hepatitis B occurs often. Talk with your health care provider about which countries are considered high risk.  Your parents were born in a high-risk country and you have not received a shot to protect against hepatitis B (hepatitis B vaccine).  You have HIV or AIDS.  You use needles to inject street drugs.  You live with, or have sex with, someone who has hepatitis B.  You get hemodialysis treatment.  You take certain medicines for conditions like cancer, organ transplantation, and autoimmune conditions.  Hepatitis C blood testing is recommended for all people born from 1945 through 1965 and any individual with known risks for hepatitis C.  Practice safe sex. Use condoms and avoid high-risk sexual practices to reduce the spread of sexually transmitted infections (STIs). STIs include gonorrhea, chlamydia, syphilis, trichomonas, herpes, HPV, and human immunodeficiency virus (HIV). Herpes, HIV, and HPV are viral illnesses that have no cure. They can result in disability, cancer, and death.  You should be screened for sexually transmitted illnesses (STIs) including gonorrhea and chlamydia if:  You are sexually active and are younger than 24 years.  You are older than 24 years and your health care provider tells you that you are at risk for this type of infection.  Your sexual activity has changed  since you were last screened and you are at an increased risk for chlamydia or gonorrhea. Ask your health care provider if you are at risk.  If you are at risk of being infected with HIV, it is recommended that you take a prescription medicine daily to prevent HIV infection. This is called preexposure prophylaxis (PrEP). You are considered at risk if:  You are sexually active and do not regularly use condoms or know the HIV status of your partner(s).  You take drugs by injection.  You are sexually active with a partner who has HIV.  Talk with your health care provider about whether you are at high risk of being infected with HIV. If   you choose to begin PrEP, you should first be tested for HIV. You should then be tested every 3 months for as long as you are taking PrEP.  Osteoporosis is a disease in which the bones lose minerals and strength with aging. This can result in serious bone fractures or breaks. The risk of osteoporosis can be identified using a bone density scan. Women ages 67 years and over and women at risk for fractures or osteoporosis should discuss screening with their health care providers. Ask your health care provider whether you should take a calcium supplement or vitamin D to reduce the rate of osteoporosis.  Menopause can be associated with physical symptoms and risks. Hormone replacement therapy is available to decrease symptoms and risks. You should talk to your health care provider about whether hormone replacement therapy is right for you.  Use sunscreen. Apply sunscreen liberally and repeatedly throughout the day. You should seek shade when your shadow is shorter than you. Protect yourself by wearing long sleeves, pants, a wide-brimmed hat, and sunglasses year round, whenever you are outdoors.  Once a month, do a whole body skin exam, using a mirror to look at the skin on your back. Tell your health care provider of new moles, moles that have irregular borders, moles that  are larger than a pencil eraser, or moles that have changed in shape or color.  Stay current with required vaccines (immunizations).  Influenza vaccine. All adults should be immunized every year.  Tetanus, diphtheria, and acellular pertussis (Td, Tdap) vaccine. Pregnant women should receive 1 dose of Tdap vaccine during each pregnancy. The dose should be obtained regardless of the length of time since the last dose. Immunization is preferred during the 27th-36th week of gestation. An adult who has not previously received Tdap or who does not know her vaccine status should receive 1 dose of Tdap. This initial dose should be followed by tetanus and diphtheria toxoids (Td) booster doses every 10 years. Adults with an unknown or incomplete history of completing a 3-dose immunization series with Td-containing vaccines should begin or complete a primary immunization series including a Tdap dose. Adults should receive a Td booster every 10 years.  Varicella vaccine. An adult without evidence of immunity to varicella should receive 2 doses or a second dose if she has previously received 1 dose. Pregnant females who do not have evidence of immunity should receive the first dose after pregnancy. This first dose should be obtained before leaving the health care facility. The second dose should be obtained 4-8 weeks after the first dose.  Human papillomavirus (HPV) vaccine. Females aged 13-26 years who have not received the vaccine previously should obtain the 3-dose series. The vaccine is not recommended for use in pregnant females. However, pregnancy testing is not needed before receiving a dose. If a female is found to be pregnant after receiving a dose, no treatment is needed. In that case, the remaining doses should be delayed until after the pregnancy. Immunization is recommended for any person with an immunocompromised condition through the age of 61 years if she did not get any or all doses earlier. During the  3-dose series, the second dose should be obtained 4-8 weeks after the first dose. The third dose should be obtained 24 weeks after the first dose and 16 weeks after the second dose.  Zoster vaccine. One dose is recommended for adults aged 30 years or older unless certain conditions are present.  Measles, mumps, and rubella (MMR) vaccine. Adults born  before 1957 generally are considered immune to measles and mumps. Adults born in 1957 or later should have 1 or more doses of MMR vaccine unless there is a contraindication to the vaccine or there is laboratory evidence of immunity to each of the three diseases. A routine second dose of MMR vaccine should be obtained at least 28 days after the first dose for students attending postsecondary schools, health care workers, or international travelers. People who received inactivated measles vaccine or an unknown type of measles vaccine during 1963-1967 should receive 2 doses of MMR vaccine. People who received inactivated mumps vaccine or an unknown type of mumps vaccine before 1979 and are at high risk for mumps infection should consider immunization with 2 doses of MMR vaccine. For females of childbearing age, rubella immunity should be determined. If there is no evidence of immunity, females who are not pregnant should be vaccinated. If there is no evidence of immunity, females who are pregnant should delay immunization until after pregnancy. Unvaccinated health care workers born before 1957 who lack laboratory evidence of measles, mumps, or rubella immunity or laboratory confirmation of disease should consider measles and mumps immunization with 2 doses of MMR vaccine or rubella immunization with 1 dose of MMR vaccine.  Pneumococcal 13-valent conjugate (PCV13) vaccine. When indicated, a person who is uncertain of his immunization history and has no record of immunization should receive the PCV13 vaccine. All adults 65 years of age and older should receive this  vaccine. An adult aged 19 years or older who has certain medical conditions and has not been previously immunized should receive 1 dose of PCV13 vaccine. This PCV13 should be followed with a dose of pneumococcal polysaccharide (PPSV23) vaccine. Adults who are at high risk for pneumococcal disease should obtain the PPSV23 vaccine at least 8 weeks after the dose of PCV13 vaccine. Adults older than 57 years of age who have normal immune system function should obtain the PPSV23 vaccine dose at least 1 year after the dose of PCV13 vaccine.  Pneumococcal polysaccharide (PPSV23) vaccine. When PCV13 is also indicated, PCV13 should be obtained first. All adults aged 65 years and older should be immunized. An adult younger than age 65 years who has certain medical conditions should be immunized. Any person who resides in a nursing home or long-term care facility should be immunized. An adult smoker should be immunized. People with an immunocompromised condition and certain other conditions should receive both PCV13 and PPSV23 vaccines. People with human immunodeficiency virus (HIV) infection should be immunized as soon as possible after diagnosis. Immunization during chemotherapy or radiation therapy should be avoided. Routine use of PPSV23 vaccine is not recommended for American Indians, [place] Natives, or people younger than 65 years unless there are medical conditions that require PPSV23 vaccine. When indicated, people who have unknown immunization and have no record of immunization should receive PPSV23 vaccine. One-time revaccination 5 years after the first dose of PPSV23 is recommended for people aged 19-64 years who have chronic kidney failure, nephrotic syndrome, asplenia, or immunocompromised conditions. People who received 1-2 doses of PPSV23 before age 65 years should receive another dose of PPSV23 vaccine at age 65 years or later if at least 5 years have passed since the previous dose. Doses of PPSV23 are not  needed for people immunized with PPSV23 at or after age 65 years.  Meningococcal vaccine. Adults with asplenia or persistent complement component deficiencies should receive 2 doses of quadrivalent meningococcal conjugate (MenACWY-D) vaccine. The doses should be obtained   at least 2 months apart. Microbiologists working with certain meningococcal bacteria, [place] recruits, people at risk during an outbreak, and people who travel to or live in countries with a high rate of meningitis should be immunized. A first-year college student up through age 34 years who is living in a residence hall should receive a dose if she did not receive a dose on or after her 16th birthday. Adults who have certain high-risk conditions should receive one or more doses of vaccine.  Hepatitis A vaccine. Adults who wish to be protected from this disease, have certain high-risk conditions, work with hepatitis A-infected animals, work in hepatitis A research labs, or travel to or work in countries with a high rate of hepatitis A should be immunized. Adults who were previously unvaccinated and who anticipate close contact with an international adoptee during the first 60 days after arrival in the [place] from a country with a high rate of hepatitis A should be immunized.  Hepatitis B vaccine. Adults who wish to be protected from this disease, have certain high-risk conditions, may be exposed to blood or other infectious body fluids, are household contacts or sex partners of hepatitis B positive people, are clients or workers in certain care facilities, or travel to or work in countries with a high rate of hepatitis B should be immunized.  Haemophilus influenzae type b (Hib) vaccine. A previously unvaccinated person with asplenia or sickle cell disease or having a scheduled splenectomy should receive 1 dose of Hib vaccine. Regardless of previous immunization, a recipient of a hematopoietic stem cell transplant should receive a  3-dose series 6-12 months after her successful transplant. Hib vaccine is not recommended for adults with HIV infection. Preventive Services / Frequency Ages 35 to 4 years  Blood pressure check.** / Every 3-5 years.  Lipid and cholesterol check.** / Every 5 years beginning at age 60.  Clinical breast exam.** / Every 3 years for women in their 71s and 10s.  BRCA-related cancer risk assessment.** / For women who have family members with a BRCA-related cancer (breast, ovarian, tubal, or peritoneal cancers).  Pap test.** / Every 2 years from ages 76 through 26. Every 3 years starting at age 61 through age 76 or 93 with a history of 3 consecutive normal Pap tests.  HPV screening.** / Every 3 years from ages 37 through ages 60 to 51 with a history of 3 consecutive normal Pap tests.  Hepatitis C blood test.** / For any individual with known risks for hepatitis C.  Skin self-exam. / Monthly.  Influenza vaccine. / Every year.  Tetanus, diphtheria, and acellular pertussis (Tdap, Td) vaccine.** / Consult your health care provider. Pregnant women should receive 1 dose of Tdap vaccine during each pregnancy. 1 dose of Td every 10 years.  Varicella vaccine.** / Consult your health care provider. Pregnant females who do not have evidence of immunity should receive the first dose after pregnancy.  HPV vaccine. / 3 doses over 6 months, if 93 and younger. The vaccine is not recommended for use in pregnant females. However, pregnancy testing is not needed before receiving a dose.  Measles, mumps, rubella (MMR) vaccine.** / You need at least 1 dose of MMR if you were born in 1957 or later. You may also need a 2nd dose. For females of childbearing age, rubella immunity should be determined. If there is no evidence of immunity, females who are not pregnant should be vaccinated. If there is no evidence of immunity, females who are  pregnant should delay immunization until after pregnancy.  Pneumococcal  13-valent conjugate (PCV13) vaccine.** / Consult your health care provider.  Pneumococcal polysaccharide (PPSV23) vaccine.** / 1 to 2 doses if you smoke cigarettes or if you have certain conditions.  Meningococcal vaccine.** / 1 dose if you are age 68 to 8 years and a Market researcher living in a residence hall, or have one of several medical conditions, you need to get vaccinated against meningococcal disease. You may also need additional booster doses.  Hepatitis A vaccine.** / Consult your health care provider.  Hepatitis B vaccine.** / Consult your health care provider.  Haemophilus influenzae type b (Hib) vaccine.** / Consult your health care provider. Ages 7 to 53 years  Blood pressure check.** / Every year.  Lipid and cholesterol check.** / Every 5 years beginning at age 25 years.  Lung cancer screening. / Every year if you are aged 11-80 years and have a 30-pack-year history of smoking and currently smoke or have quit within the past 15 years. Yearly screening is stopped once you have quit smoking for at least 15 years or develop a health problem that would prevent you from having lung cancer treatment.  Clinical breast exam.** / Every year after age 48 years.  BRCA-related cancer risk assessment.** / For women who have family members with a BRCA-related cancer (breast, ovarian, tubal, or peritoneal cancers).  Mammogram.** / Every year beginning at age 41 years and continuing for as long as you are in good health. Consult with your health care provider.  Pap test.** / Every 3 years starting at age 65 years through age 37 or 70 years with a history of 3 consecutive normal Pap tests.  HPV screening.** / Every 3 years from ages 72 years through ages 60 to 40 years with a history of 3 consecutive normal Pap tests.  Fecal occult blood test (FOBT) of stool. / Every year beginning at age 21 years and continuing until age 5 years. You may not need to do this test if you get  a colonoscopy every 10 years.  Flexible sigmoidoscopy or colonoscopy.** / Every 5 years for a flexible sigmoidoscopy or every 10 years for a colonoscopy beginning at age 35 years and continuing until age 48 years.  Hepatitis C blood test.** / For all people born from [birth year] through 1965 and any individual with known risks for hepatitis C.  Skin self-exam. / Monthly.  Influenza vaccine. / Every year.  Tetanus, diphtheria, and acellular pertussis (Tdap/Td) vaccine.** / Consult your health care provider. Pregnant women should receive 1 dose of Tdap vaccine during each pregnancy. 1 dose of Td every 10 years.  Varicella vaccine.** / Consult your health care provider. Pregnant females who do not have evidence of immunity should receive the first dose after pregnancy.  Zoster vaccine.** / 1 dose for adults aged 30 years or older.  Measles, mumps, rubella (MMR) vaccine.** / You need at least 1 dose of MMR if you were born in 1957 or later. You may also need a second dose. For females of childbearing age, rubella immunity should be determined. If there is no evidence of immunity, females who are not pregnant should be vaccinated. If there is no evidence of immunity, females who are pregnant should delay immunization until after pregnancy.  Pneumococcal 13-valent conjugate (PCV13) vaccine.** / Consult your health care provider.  Pneumococcal polysaccharide (PPSV23) vaccine.** / 1 to 2 doses if you smoke cigarettes or if you have certain conditions.  Meningococcal vaccine.** /  Consult your health care provider.  Hepatitis A vaccine.** / Consult your health care provider.  Hepatitis B vaccine.** / Consult your health care provider.  Haemophilus influenzae type b (Hib) vaccine.** / Consult your health care provider. Ages 64 years and over  Blood pressure check.** / Every year.  Lipid and cholesterol check.** / Every 5 years beginning at age 23 years.  Lung cancer screening. / Every year if you  are aged 16-80 years and have a 30-pack-year history of smoking and currently smoke or have quit within the past 15 years. Yearly screening is stopped once you have quit smoking for at least 15 years or develop a health problem that would prevent you from having lung cancer treatment.  Clinical breast exam.** / Every year after age 74 years.  BRCA-related cancer risk assessment.** / For women who have family members with a BRCA-related cancer (breast, ovarian, tubal, or peritoneal cancers).  Mammogram.** / Every year beginning at age 44 years and continuing for as long as you are in good health. Consult with your health care provider.  Pap test.** / Every 3 years starting at age 58 years through age 22 or 39 years with 3 consecutive normal Pap tests. Testing can be stopped between 65 and 70 years with 3 consecutive normal Pap tests and no abnormal Pap or HPV tests in the past 10 years.  HPV screening.** / Every 3 years from ages 64 years through ages 70 or 61 years with a history of 3 consecutive normal Pap tests. Testing can be stopped between 65 and 70 years with 3 consecutive normal Pap tests and no abnormal Pap or HPV tests in the past 10 years.  Fecal occult blood test (FOBT) of stool. / Every year beginning at age 40 years and continuing until age 27 years. You may not need to do this test if you get a colonoscopy every 10 years.  Flexible sigmoidoscopy or colonoscopy.** / Every 5 years for a flexible sigmoidoscopy or every 10 years for a colonoscopy beginning at age 7 years and continuing until age 32 years.  Hepatitis C blood test.** / For all people born from [birth year] through 1967 and any individual with known risks for hepatitis C.  Osteoporosis screening.** / A one-time screening for women ages 30 years and over and women at risk for fractures or osteoporosis.  Skin self-exam. / Monthly.  Influenza vaccine. / Every year.  Tetanus, diphtheria, and acellular pertussis (Tdap/Td)  vaccine.** / 1 dose of Td every 10 years.  Varicella vaccine.** / Consult your health care provider.  Zoster vaccine.** / 1 dose for adults aged 35 years or older.  Pneumococcal 13-valent conjugate (PCV13) vaccine.** / Consult your health care provider.  Pneumococcal polysaccharide (PPSV23) vaccine.** / 1 dose for all adults aged 46 years and older.  Meningococcal vaccine.** / Consult your health care provider.  Hepatitis A vaccine.** / Consult your health care provider.  Hepatitis B vaccine.** / Consult your health care provider.  Haemophilus influenzae type b (Hib) vaccine.** / Consult your health care provider. ** Family history and personal history of risk and conditions may change your health care provider's recommendations.   This information is not intended to replace advice given to you by your health care provider. Make sure you discuss any questions you have with your health care provider.   Document Released: 01/24/2002 Document Revised: 12/19/2014 Document Reviewed: 04/25/2011 Elsevier Interactive Patient Education [employer].

## 2016-03-04 LAB — CBC WITH DIFFERENTIAL/PLATELET
BASOS PCT: 0.7 % (ref 0.0–3.0)
Basophils Absolute: 0.1 10*3/uL (ref 0.0–0.1)
EOS PCT: 1.7 % (ref 0.0–5.0)
Eosinophils Absolute: 0.1 10*3/uL (ref 0.0–0.7)
HCT: 43.8 % (ref 36.0–46.0)
Hemoglobin: 14.6 g/dL (ref 12.0–15.0)
LYMPHS ABS: 3.2 10*3/uL (ref 0.7–4.0)
Lymphocytes Relative: 36.1 % (ref 12.0–46.0)
MCHC: 33.4 g/dL (ref 30.0–36.0)
MCV: 90.4 fl (ref 78.0–100.0)
MONO ABS: 0.7 10*3/uL (ref 0.1–1.0)
Monocytes Relative: 8 % (ref 3.0–12.0)
NEUTROS PCT: 53.5 % (ref 43.0–77.0)
Neutro Abs: 4.8 10*3/uL (ref 1.4–7.7)
Platelets: 341 10*3/uL (ref 150.0–400.0)
RBC: 4.84 Mil/uL (ref 3.87–5.11)
RDW: 13.1 % (ref 11.5–15.5)
WBC: 9 10*3/uL (ref 4.0–10.5)

## 2016-03-04 LAB — COMPREHENSIVE METABOLIC PANEL
ALBUMIN: 4.2 g/dL (ref 3.5–5.2)
ALK PHOS: 56 U/L (ref 39–117)
ALT: 19 U/L (ref 0–35)
AST: 21 U/L (ref 0–37)
BUN: 15 mg/dL (ref 6–23)
CO2: 32 mEq/L (ref 19–32)
CREATININE: 0.79 mg/dL (ref 0.40–1.20)
Calcium: 9.4 mg/dL (ref 8.4–10.5)
Chloride: 104 mEq/L (ref 96–112)
GFR: 79.92 mL/min (ref >60.00)
GLUCOSE: 94 mg/dL (ref 70–99)
POTASSIUM: 3.8 meq/L (ref 3.5–5.1)
SODIUM: 142 meq/L (ref 135–145)
TOTAL PROTEIN: 7 g/dL (ref 6.0–8.3)
Total Bilirubin: 0.7 mg/dL (ref 0.2–1.2)

## 2016-03-04 LAB — LIPID PANEL
CHOLESTEROL: 150 mg/dL (ref 0–200)
HDL: 49 mg/dL (ref >39.00)
LDL Cholesterol: 89 mg/dL (ref 0–99)
NonHDL: 101.17
TRIGLYCERIDES: 63 mg/dL (ref 0.0–149.0)
Total CHOL/HDL Ratio: 3
VLDL: 12.6 mg/dL (ref 0.0–40.0)

## 2016-03-04 LAB — HEPATITIS C ANTIBODY: HCV Ab: NEGATIVE

## 2016-03-04 LAB — TSH: TSH: 1.86 u[IU]/mL (ref 0.35–4.50)

## 2016-03-06 NOTE — Progress Notes (Deleted)
Patient ID: [name], female    DOB: May 05, 1961  Age: 55 y.o. MRN: [id number]    Subjective:  Subjective HPI [name] presents for ***  Review of Systems  History Past Medical History  Diagnosis Date  . Loss of appetite   . Allergic rhinitis   . Back pain   . History of Ostium Secundum Atrial Septal Defect 1989    Status post repair in 1989/1990  . Varicose veins   . IBS (irritable bowel syndrome)   . Intestinal volvulus [facility]) May 2007  . Endometriosis   . Hx of migraines   . Anxiety   . Onychomycosis   . Fatigue   . Contact lens/glasses fitting   . Incontinence   . Hiatal hernia   . GERD (gastroesophageal reflux disease)   . History of cardiac catheterization 12/1990    She has past surgical history that includes ASD repair, secundum (1990); hysterectomy for severe endometriosis; right leg fracture with surgery; varicose laser vein surgery (2009); right ankle surgery (2010); Abdominal hysterectomy (2004); transthoracic echocardiogram ( January 2012); and Nasal septum surgery.   Her family history includes Cancer in her maternal grandmother and paternal grandfather; Clotting disorder in her maternal grandmother and sister; Colon cancer in her paternal grandfather; Diabetes in her mother; Heart disease in her maternal grandfather and sister; Lung cancer in her maternal grandmother; Prostate cancer in her maternal grandfather.She reports that she quit smoking about 27 years ago. Her smoking use included Cigarettes. She has a 10 pack-year smoking history. She has never used smokeless tobacco. She reports that she drinks alcohol. She reports that she does not use illicit drugs.  Current Outpatient Prescriptions on File Prior to Visit  Medication Sig Dispense Refill  . ALPRAZolam (XANAX) 0.5 MG tablet take 1/2 to 1 tablet by mouth three times a day if needed for nerves 90 tablet 0  . B Complex-Biotin-FA (B COMPLEX 100 TR PO) Take 1 tablet by mouth daily.      .  Calcium Carb-Cholecalciferol (CALTRATE 600+D) 600-800 MG-UNIT TABS Take 1 tablet by mouth 2 (two) times daily.    . Calcium Glycerophosphate (PRELIEF) 340 (65-50) MG (CA-P) TABS Take 1 tablet by mouth as needed.    . Ciclopirox 1 % shampoo Massage into scalp qd and leave in for 3 min then rinse 120 mL 0  . cyclobenzaprine (FLEXERIL) 10 MG tablet Take 1 tablet (10 mg total) by mouth 3 (three) times daily as needed. 30 tablet 1  . DYMISTA 137-50 MCG/ACT SUSP Place 1 spray into each nostril twice daily.  0  . EPIPEN 2-PAK 0.3 MG/0.3ML SOAJ injection     . levocetirizine (XYZAL) 5 MG tablet     . magnesium oxide (MAG-OX) 400 MG tablet Take 400 mg by mouth daily.    . montelukast (SINGULAIR) 10 MG tablet     . Multiple Vitamin (MULTIVITAMINS PO) Take 1 tablet by mouth daily.      . Omega-3 Fatty Acids (FISH OIL) 1000 MG CAPS Take 1 capsule by mouth daily.      . polyethylene glycol powder (MIRALAX) powder Take 17 g by mouth at bedtime.     . Probiotic Product (PROBIOTIC DAILY PO) Take 1 capsule by mouth daily.      No current facility-administered medications on file prior to visit.     Objective:  Objective Physical Exam BP 108/70 mmHg  Pulse 57  Temp(Src) 98.5 F (36.9 C) (Oral)  Ht 5' 10.75" (1.797 m)  Wt  176 lb 6.4 oz (80.015 kg)  BMI 24.78 kg/m2  SpO2 96% Wt Readings from Last 3 Encounters:  03/03/16 176 lb 6.4 oz (80.015 kg)  01/07/16 177 lb (80.287 kg)  10/09/15 177 lb 3.2 oz (80.377 kg)     Lab Results  Component Value Date   WBC 9.0 03/03/2016   HGB 14.6 03/03/2016   HCT 43.8 03/03/2016   PLT 341.0 03/03/2016   GLUCOSE 94 03/03/2016   CHOL 150 03/03/2016   TRIG 63.0 03/03/2016   HDL 49.00 03/03/2016   LDLCALC 89 03/03/2016   ALT 19 03/03/2016   AST 21 03/03/2016   NA 142 03/03/2016   K 3.8 03/03/2016   CL 104 03/03/2016   CREATININE 0.79 03/03/2016   BUN 15 03/03/2016   CO2 32 03/03/2016   TSH 1.86 03/03/2016    Dg Ugi W/high Density W/kub  04/12/2011   *RADIOLOGY REPORT* Clinical Data:  Hiatal hernia and esophagitis UPPER GI SERIES WITH KUB Technique:  Routine upper GI series was performed with thin and high density barium. Comparison:  None. Findings: The KUB reveals a normal stool and bowel gas pattern a moderate stool burden throughout the colon.  No abnormal calcifications overlie the abdomen or pelvis.  There is no gross organomegaly.  The osseous structures are unremarkable. The  esophagus, stomach, duodenal bulb, and remainder of the C-loop have a normal appearance with no evidence of stricture, ulceration, fixed filling defect.  The hiatal hernia is not demonstrated on this exam. IMPRESSION: Normal upper GI. Original Report Authenticated By: Duayne Cal, M.D.    Assessment & Plan:  Plan I have discontinued [name]'s PATADAY. I am also having her start on FLUoxetine. Additionally, I am having her maintain her Fish Oil, Multiple Vitamin (MULTIVITAMINS PO), B Complex-Biotin-FA (B COMPLEX 100 TR PO), polyethylene glycol powder, Calcium Carb-Cholecalciferol, Probiotic Product (PROBIOTIC DAILY PO), Calcium Glycerophosphate, magnesium oxide, levocetirizine, montelukast, EPIPEN 2-PAK, DYMISTA, cyclobenzaprine, Ciclopirox, ALPRAZolam, PAZEO, and dexlansoprazole.  Meds ordered this encounter  Medications  . PAZEO 0.7 % SOLN    Sig:     Refill:  0  . dexlansoprazole (DEXILANT) 60 MG capsule    Sig: Take 1 capsule (60 mg total) by mouth daily.    Dispense:  90 capsule    Refill:  3  . FLUoxetine (PROZAC) 10 MG capsule    Sig: Take 1 capsule (10 mg total) by mouth daily.    Dispense:  30 capsule    Refill:  3    Problem List Items Addressed This Visit      Unprioritized   GERD   Relevant Medications   dexlansoprazole (DEXILANT) 60 MG capsule    Other Visit Diagnoses    Preventative health care    -  Primary    Relevant Orders    Comprehensive metabolic panel (Completed)    CBC with Differential/Platelet (Completed)    Lipid panel  (Completed)    POCT urinalysis dipstick (Completed)    TSH (Completed)    Hepatitis C antibody (Completed)    HIV antibody (Completed)    Need for hepatitis C screening test        Relevant Orders    Hepatitis C antibody (Completed)    Screening for HIV (human immunodeficiency virus)        Relevant Orders    HIV antibody (Completed)    Stress reaction of bone, initial encounter        Relevant Medications    FLUoxetine (PROZAC) 10 MG capsule  Follow-up: Return in about 1 month (around 04/03/2016), or if symptoms worsen or fail to improve, for menopause/ stress.  Garnet Koyanagi, DO

## 2016-03-06 NOTE — Progress Notes (Addendum)
Subjective:     [name] is a 55 y.o. female and is here for a comprehensive physical exam. The patient reports no problems.  Social History   Social History  . Marital Status: Married    Spouse Name: Richard  . Number of Children: N/A  . Years of Education: N/A   Occupational History  . SALES    Social History Main Topics  . Smoking status: Former Smoker -- 1.00 packs/day for 10 years    Types: Cigarettes    Quit date: 12/12/1988  . Smokeless tobacco: Never Used  . Alcohol Use: Yes     Comment: social use/wine  . Drug Use: No  . Sexual Activity: Not on file   Other Topics Concern  . Not on file   Social History Narrative   She has been married for 33 years. They have 2 children and at least one grandchild. She lives with her husband. She does all her activities of daily living. She works as a Midwife for [employer].     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 She does other days of the gym and she is able to at least 60 minutes a day 5 days a week.   She is a former smoker who quit in 1990. This was after smoking a pack a day for 10 years. She drinks social alcohol on occasion.   Health Maintenance  Topic Date Due  . INFLUENZA VACCINE  07/12/2016  . MAMMOGRAM  02/10/2017  . PAP SMEAR  02/10/2018  . COLONOSCOPY  02/17/2021  . TETANUS/TDAP  01/15/2022  . Hepatitis C Screening  Completed  . HIV Screening  Completed    The following portions of the patient's history were reviewed and updated as appropriate:  She  has a past medical history of Loss of appetite; Allergic rhinitis; Back pain; History of Ostium Secundum Atrial Septal Defect (1989); Varicose veins; IBS (irritable bowel syndrome); Intestinal volvulus [facility]) (May 2007); Endometriosis; migraines; Anxiety; Onychomycosis; Fatigue; Contact lens/glasses fitting; Incontinence; Hiatal hernia; GERD  (gastroesophageal reflux disease); and History of cardiac catheterization (12/1990). She  does not have any pertinent problems on file. She  has past surgical history that includes ASD repair, secundum (1990); hysterectomy for severe endometriosis; right leg fracture with surgery; varicose laser vein surgery (2009); right ankle surgery (2010); Abdominal hysterectomy (2004); transthoracic echocardiogram ( January 2012); and Nasal septum surgery. Her family history includes Cancer in her maternal grandmother and paternal grandfather; Clotting disorder in her maternal grandmother and sister; Colon cancer in her paternal grandfather; Diabetes in her mother; Heart disease in her maternal grandfather and sister; Lung cancer in her maternal grandmother; Prostate cancer in her maternal grandfather. She  reports that she quit smoking about 27 years ago. Her smoking use included Cigarettes. She has a 10 pack-year smoking history. She has never used smokeless tobacco. She reports that she drinks alcohol. She reports that she does not use illicit drugs. She has a current medication list which includes the following prescription(s): alprazolam, b complex-biotin-fa, calcium carb-cholecalciferol, calcium glycerophosphate, cyclobenzaprine, dexlansoprazole, dymista, epipen 2-pak, levocetirizine, magnesium oxide, montelukast, multiple vitamin, fish oil, pazeo, polyethylene glycol powder, probiotic product, ciclopirox, and fluoxetine. Current Outpatient Prescriptions on File Prior to Visit  Medication Sig Dispense Refill  . ALPRAZolam (XANAX) 0.5 MG tablet take 1/2 to 1 tablet by mouth three times a day if needed for nerves  90 tablet 0  . B Complex-Biotin-FA (B COMPLEX 100 TR PO) Take 1 tablet by mouth daily.      . Calcium Carb-Cholecalciferol (CALTRATE 600+D) 600-800 MG-UNIT TABS Take 1 tablet by mouth 2 (two) times daily.    . Calcium Glycerophosphate (PRELIEF) 340 (65-50) MG (CA-P) TABS Take 1 tablet by mouth as needed.     . cyclobenzaprine (FLEXERIL) 10 MG tablet Take 1 tablet (10 mg total) by mouth 3 (three) times daily as needed. 30 tablet 1  . DYMISTA 137-50 MCG/ACT SUSP Place 1 spray into each nostril twice daily.  0  . EPIPEN 2-PAK 0.3 MG/0.3ML SOAJ injection     . levocetirizine (XYZAL) 5 MG tablet     . magnesium oxide (MAG-OX) 400 MG tablet Take 400 mg by mouth daily.    . montelukast (SINGULAIR) 10 MG tablet     . Multiple Vitamin (MULTIVITAMINS PO) Take 1 tablet by mouth daily.      . Omega-3 Fatty Acids (FISH OIL) 1000 MG CAPS Take 1 capsule by mouth daily.      . polyethylene glycol powder (MIRALAX) powder Take 17 g by mouth at bedtime.     . Probiotic Product (PROBIOTIC DAILY PO) Take 1 capsule by mouth daily.      No current facility-administered medications on file prior to visit.   She is allergic to chlordiazepoxide-clidinium; codeine; and penicillins..  Review of Systems Review of Systems  Constitutional: Negative for activity change, appetite change and fatigue.  HENT: Negative for hearing loss, congestion, tinnitus and ear discharge.  dentist q72m Eyes: Negative for visual disturbance (see optho q1y -- vision corrected to 20/20 with glasses).  Respiratory: Negative for cough, chest tightness and shortness of breath.   Cardiovascular: Negative for chest pain, palpitations and leg swelling.  Gastrointestinal: Negative for abdominal pain, diarrhea, constipation and abdominal distention.  Genitourinary: Negative for urgency, frequency, decreased urine volume and difficulty urinating.  Musculoskeletal: Negative for back pain, arthralgias and gait problem.  Skin: Negative for color change, pallor and rash.  Neurological: Negative for dizziness, light-headedness, numbness and headaches.  Hematological: Negative for adenopathy. Does not bruise/bleed easily.  Psychiatric/Behavioral: Negative for suicidal ideas, confusion, sleep disturbance, self-injury, dysphoric mood, decreased concentration  and agitation.       Objective:    BP 108/70 mmHg  Pulse 57  Temp(Src) 98.5 F (36.9 C) (Oral)  Ht 5' 10.75" (1.797 m)  Wt 176 lb 6.4 oz (80.015 kg)  BMI 24.78 kg/m2  SpO2 96% General appearance: alert, cooperative, appears stated age and no distress Head: Normocephalic, without obvious abnormality, atraumatic Eyes: conjunctivae/corneas clear. PERRL, EOM's intact. Fundi benign. Ears: normal TM's and external ear canals both ears Nose: Nares normal. Septum midline. Mucosa normal. No drainage or sinus tenderness. Throat: lips, mucosa, and tongue normal; teeth and gums normal Neck: no adenopathy, no carotid bruit, no JVD, supple, symmetrical, trachea midline and thyroid not enlarged, symmetric, no tenderness/mass/nodules Back: symmetric, no curvature. ROM normal. No CVA tenderness. Lungs: clear to auscultation bilaterally Breasts: normal appearance, no masses or tenderness Heart: regular rate and rhythm, S1, S2 normal, no murmur, click, rub or gallop Abdomen: soft, non-tender; bowel sounds normal; no masses,  no organomegaly Pelvic: not indicated; status post hysterectomy, negative ROS Extremities: extremities normal, atraumatic, no cyanosis or edema Pulses: 2+ and symmetric Skin: Skin color, texture, turgor normal. No rashes or lesions Lymph nodes: Cervical, supraclavicular, and axillary nodes normal. Neurologic: Alert and oriented X 3, normal strength and tone. Normal symmetric reflexes. Normal  coordination and gait Psych- no depression, no anxiety      Assessment:    Healthy female exam.       Plan:  ghm utd   See After Visit Summary for Counseling Recommendations  Check labs  1. Gastroesophageal reflux disease, esophagitis presence not specified   - dexlansoprazole (DEXILANT) 60 MG capsule; Take 1 capsule (60 mg total) by mouth daily.  Dispense: 90 capsule; Refill: 3  2. Preventative health care See above - Comprehensive metabolic panel - CBC with  Differential/Platelet - Lipid panel - POCT urinalysis dipstick - TSH - Hepatitis C antibody - HIV antibody  3. Need for hepatitis C screening test   - Hepatitis C antibody  4. Screening for HIV (human immunodeficiency virus)   - HIV antibody  5. Stress reaction    - FLUoxetine (PROZAC) 10 MG capsule; Take 1 capsule (10 mg total) by mouth daily.  Dispense: 30 capsule; Refill: 3

## 2016-04-05 ENCOUNTER — Encounter: Payer: Self-pay | Admitting: Family Medicine

## 2016-04-05 ENCOUNTER — Ambulatory Visit (INDEPENDENT_AMBULATORY_CARE_PROVIDER_SITE_OTHER): Payer: Managed Care, Other (non HMO) | Admitting: Family Medicine

## 2016-04-05 VITALS — BP 108/62 | HR 57 | Temp 98.8°F | Ht 71.0 in | Wt 178.2 lb

## 2016-04-05 DIAGNOSIS — F430 Acute stress reaction: Secondary | ICD-10-CM | POA: Diagnosis not present

## 2016-04-05 DIAGNOSIS — L309 Dermatitis, unspecified: Secondary | ICD-10-CM

## 2016-04-05 MED ORDER — CICLOPIROX 1 % EX SHAM: MEDICATED_SHAMPOO | CUTANEOUS | Status: DC

## 2016-04-05 MED ORDER — FLUOXETINE HCL 20 MG PO TABS: 20.0000 mg | ORAL_TABLET | Freq: Every day | ORAL | Status: DC

## 2016-04-05 NOTE — Patient Instructions (Signed)
  Adjustment Disorder Adjustment disorder is an unusually severe reaction to a stressful life event, such as the loss of a job or physical illness. The event may be any stressful event other than the loss of a loved one. Adjustment disorder may affect your feelings, your thinking, how you act, or a combination of these. It may interfere with personal relationships or with the way you are at work, school, or home. People with this disorder are at risk for suicide and substance abuse. They may develop a more serious mental disorder, such as major depressive disorder or post-traumatic stress disorder. SIGNS AND SYMPTOMS  Symptoms may include:  Sadness, depressed mood, or crying spells.  Loss of enjoyment.  Change in appetite or weight.  Sense of loss or hopelessness.  Thoughts of suicide.  Anxiety, worry, or nervousness.  Trouble sleeping.  Avoiding family and friends.  Poor school performance.  Fighting or vandalism.  Reckless driving.  Skipping school.  Poor work performance.  Ignoring bills. Symptoms of adjustment disorder start within 3 months of the stressful life event. They do not last more than 6 months after the event has ended. DIAGNOSIS  To make a diagnosis, your health care provider will ask about what has happened in your life and how it has affected you. He or she may also ask about your medical history and use of medicines, alcohol, and other substances. Your health care provider may do a physical exam and order lab tests or other studies. You may be referred to a mental health specialist for evaluation. TREATMENT  Treatment options include:  Counseling or talk therapy. Talk therapy is usually provided by mental health specialists.  Medicine. Certain medicines may help with depression, anxiety, and sleep.  Support groups. Support groups offer emotional support, advice, and guidance. They are made up of people who have had similar experiences. HOME CARE  INSTRUCTIONS  Keep all follow-up visits as directed by your health care provider. This is important.  Take medicines only as directed by your health care provider. SEEK MEDICAL CARE IF:  Your symptoms get worse.  SEEK IMMEDIATE MEDICAL CARE IF: You have serious thoughts about hurting yourself or someone else. MAKE SURE YOU:  Understand these instructions.  Will watch your condition.  Will get help right away if you are not doing well or get worse.   This information is not intended to replace advice given to you by your health care provider. Make sure you discuss any questions you have with your health care provider.   Document Released: 08/02/2006 Document Revised: 12/19/2014 Document Reviewed: 04/22/2014 Elsevier Interactive Patient Education 2016 [employer]  

## 2016-04-05 NOTE — Addendum Note (Signed)
Addended by: Roma Schanz R on: 04/05/2016 03:59 PM   Modules accepted: Orders, Medications

## 2016-04-05 NOTE — Progress Notes (Signed)
Patient ID: [name], female    DOB: 13-Apr-1961  Age: 55 y.o. MRN: [id number]    Subjective:  Subjective HPI [name] presents for f/u stress.  It has improved but states it still could be better.    Review of Systems  Constitutional: Negative for diaphoresis, appetite change, fatigue and unexpected weight change.  Eyes: Negative for pain, redness and visual disturbance.  Respiratory: Negative for cough, chest tightness, shortness of breath and wheezing.   Cardiovascular: Negative for chest pain, palpitations and leg swelling.  Endocrine: Negative for cold intolerance, heat intolerance, polydipsia, polyphagia and polyuria.  Genitourinary: Negative for dysuria, frequency and difficulty urinating.  Neurological: Negative for dizziness, light-headedness, numbness and headaches.    History Past Medical History  Diagnosis Date  . Loss of appetite   . Allergic rhinitis   . Back pain   . History of Ostium Secundum Atrial Septal Defect 1989    Status post repair in 1989/1990  . Varicose veins   . IBS (irritable bowel syndrome)   . Intestinal volvulus [facility]) May 2007  . Endometriosis   . Hx of migraines   . Anxiety   . Onychomycosis   . Fatigue   . Contact lens/glasses fitting   . Incontinence   . Hiatal hernia   . GERD (gastroesophageal reflux disease)   . History of cardiac catheterization 12/1990    She has past surgical history that includes ASD repair, secundum (1990); hysterectomy for severe endometriosis; right leg fracture with surgery; varicose laser vein surgery (2009); right ankle surgery (2010); Abdominal hysterectomy (2004); transthoracic echocardiogram ( January 2012); and Nasal septum surgery.   Her family history includes Cancer in her maternal grandmother and paternal grandfather; Clotting disorder in her maternal grandmother and sister; Colon cancer in her paternal grandfather; Diabetes in her mother; Heart disease in her maternal grandfather and  sister; Lung cancer in her maternal grandmother; Prostate cancer in her maternal grandfather.She reports that she quit smoking about 27 years ago. Her smoking use included Cigarettes. She has a 10 pack-year smoking history. She has never used smokeless tobacco. She reports that she drinks alcohol. She reports that she does not use illicit drugs.  Current Outpatient Prescriptions on File Prior to Visit  Medication Sig Dispense Refill  . ALPRAZolam (XANAX) 0.5 MG tablet take 1/2 to 1 tablet by mouth three times a day if needed for nerves 90 tablet 0  . B Complex-Biotin-FA (B COMPLEX 100 TR PO) Take 1 tablet by mouth daily.      . Calcium Carb-Cholecalciferol (CALTRATE 600+D) 600-800 MG-UNIT TABS Take 1 tablet by mouth 2 (two) times daily.    . Calcium Glycerophosphate (PRELIEF) 340 (65-50) MG (CA-P) TABS Take 1 tablet by mouth as needed.    . cyclobenzaprine (FLEXERIL) 10 MG tablet Take 1 tablet (10 mg total) by mouth 3 (three) times daily as needed. 30 tablet 1  . dexlansoprazole (DEXILANT) 60 MG capsule Take 1 capsule (60 mg total) by mouth daily. 90 capsule 3  . DYMISTA 137-50 MCG/ACT SUSP Place 1 spray into each nostril twice daily.  0  . EPIPEN 2-PAK 0.3 MG/0.3ML SOAJ injection     . levocetirizine (XYZAL) 5 MG tablet     . magnesium oxide (MAG-OX) 400 MG tablet Take 400 mg by mouth daily.    . montelukast (SINGULAIR) 10 MG tablet     . Multiple Vitamin (MULTIVITAMINS PO) Take 1 tablet by mouth daily.      . Omega-3 Fatty Acids (FISH  OIL) 1000 MG CAPS Take 1 capsule by mouth daily.      [name] PAZEO 0.7 % SOLN   0  . polyethylene glycol powder (MIRALAX) powder Take 17 g by mouth at bedtime.     . Probiotic Product (PROBIOTIC DAILY PO) Take 1 capsule by mouth daily.     [name] FLUoxetine (PROZAC) 20 MG tablet Take 1 tablet (20 mg total) by mouth daily. 30 tablet 3   No current facility-administered medications on file prior to visit.     Objective:  Objective Physical Exam  Constitutional: She is  oriented to person, place, and time. She appears well-developed and well-nourished.  HENT:  Head: Normocephalic and atraumatic.  Eyes: Conjunctivae and EOM are normal.  Neck: Normal range of motion. Neck supple. No JVD present. Carotid bruit is not present. No thyromegaly present.  Cardiovascular: Normal rate, regular rhythm and normal heart sounds.   No murmur heard. Pulmonary/Chest: Effort normal and breath sounds normal. No respiratory distress. She has no wheezes. She has no rales. She exhibits no tenderness.  Musculoskeletal: She exhibits no edema.  Neurological: She is alert and oriented to person, place, and time.  Psychiatric: She has a normal mood and affect. Her behavior is normal. Judgment and thought content normal.  Nursing note and vitals reviewed.  BP 108/62 mmHg  Pulse 57  Temp(Src) 98.8 F (37.1 C) (Oral)  Ht 5\' 11"  (1.803 m)  Wt 178 lb 3.2 oz (80.831 kg)  BMI 24.86 kg/m2  SpO2 98% Wt Readings from Last 3 Encounters:  04/05/16 178 lb 3.2 oz (80.831 kg)  03/03/16 176 lb 6.4 oz (80.015 kg)  01/07/16 177 lb (80.287 kg)     Lab Results  Component Value Date   WBC 9.0 03/03/2016   HGB 14.6 03/03/2016   HCT 43.8 03/03/2016   PLT 341.0 03/03/2016   GLUCOSE 94 03/03/2016   CHOL 150 03/03/2016   TRIG 63.0 03/03/2016   HDL 49.00 03/03/2016   LDLCALC 89 03/03/2016   ALT 19 03/03/2016   AST 21 03/03/2016   NA 142 03/03/2016   K 3.8 03/03/2016   CL 104 03/03/2016   CREATININE 0.79 03/03/2016   BUN 15 03/03/2016   CO2 32 03/03/2016   TSH 1.86 03/03/2016    Dg Ugi W/high Density W/kub  04/12/2011  *RADIOLOGY REPORT* Clinical Data:  Hiatal hernia and esophagitis UPPER GI SERIES WITH KUB Technique:  Routine upper GI series was performed with thin and high density barium. Comparison:  None. Findings: The KUB reveals a normal stool and bowel gas pattern a moderate stool burden throughout the colon.  No abnormal calcifications overlie the abdomen or pelvis.  There is no  gross organomegaly.  The osseous structures are unremarkable. The  esophagus, stomach, duodenal bulb, and remainder of the C-loop have a normal appearance with no evidence of stricture, ulceration, fixed filling defect.  The hiatal hernia is not demonstrated on this exam. IMPRESSION: Normal upper GI. Original Report Authenticated By: Duayne Cal, M.D.    Assessment & Plan:  Plan I am having [name] maintain her Fish Oil, Multiple Vitamin (MULTIVITAMINS PO), B Complex-Biotin-FA (B COMPLEX 100 TR PO), polyethylene glycol powder, Calcium Carb-Cholecalciferol, Probiotic Product (PROBIOTIC DAILY PO), Calcium Glycerophosphate, magnesium oxide, levocetirizine, montelukast, EPIPEN 2-PAK, DYMISTA, cyclobenzaprine, ALPRAZolam, PAZEO, dexlansoprazole, and Ciclopirox.  Meds ordered this encounter  Medications  . Ciclopirox 1 % shampoo    Sig: Massage into scalp qd and leave in for 3 min then rinse    Dispense:  120 mL    Refill:  0    Problem List Items Addressed This Visit      Unprioritized   Eczema   Relevant Medications   Ciclopirox 1 % shampoo    Other Visit Diagnoses    Stress reaction    -  Primary        Increase prozac to 20 mg qd     Follow-up: Return in about 3 months (around 07/05/2016) for stress .  Ann Held, DO

## 2016-04-08 ENCOUNTER — Encounter: Payer: Self-pay | Admitting: Internal Medicine

## 2016-04-08 ENCOUNTER — Ambulatory Visit (INDEPENDENT_AMBULATORY_CARE_PROVIDER_SITE_OTHER): Payer: Managed Care, Other (non HMO) | Admitting: Internal Medicine

## 2016-04-08 VITALS — BP 98/62 | HR 72 | Ht 70.5 in | Wt 178.0 lb

## 2016-04-08 DIAGNOSIS — K219 Gastro-esophageal reflux disease without esophagitis: Secondary | ICD-10-CM

## 2016-04-08 DIAGNOSIS — K5900 Constipation, unspecified: Secondary | ICD-10-CM

## 2016-04-08 NOTE — Progress Notes (Signed)
Subjective:    Patient ID: [name], female    DOB: 1961/03/26, 55 y.o.   MRN: [id number]  HPI Roxana Luke is a 55 year old female with past medical history of GERD, chronic constipation who is here for follow-up. She is here alone today. She also has a history of ASD status post repair, endometriosis, migraines and back pain. She was last seen in June 2016. We attempted to decrease her Dexilant at 30 mg daily but after a while she had breakthrough heartburn and indigestion. The dose was then increased back to 60 mg daily which is working extremely well for her. She denies heartburn, indigestion, nausea, vomiting, trouble swallowing. She denies cough and sore throat. Bowel movements and been regular as long as she uses MiraLAX 17 g daily. No blood in her stool or melena. She continues to work and reports that her work is been very busy lately. She denies chest pain, shortness of breath, change in appetite or weight loss.  She inquires about a product from [place] called i[employer]oro which claims to improve swallowing, reflux and possible hiatal hernia. This is a device that is placed in the mouth for short periods of time during swallowing.  Review of Systems As per history of present illness, otherwise negative  Current Medications, Allergies, Past Medical History, Past Surgical History, Family History and Social History were reviewed in [employer] record.     Objective:   Physical Exam BP 98/62 mmHg  Pulse 72  Ht 5' 10.5" (1.791 m)  Wt 178 lb (80.74 kg)  BMI 25.17 kg/m2 Constitutional: Well-developed and well-nourished. No distress. HEENT: Normocephalic and atraumatic.Conjunctivae are normal.  No scleral icterus. Neck: Neck supple. Trachea midline. Cardiovascular: Normal rate, regular rhythm and intact distal pulses. No M/R/G Pulmonary/chest: Effort normal and breath sounds normal. No wheezing, rales or rhonchi. Abdominal: Soft, nontender, nondistended.  Bowel sounds active throughout. There are no masses palpable. No hepatosplenomegaly. Extremities: no clubbing, cyanosis, or edema Neurological: Alert and oriented to person place and time. Skin: Skin is warm and dry. No rashes noted. Psychiatric: Normal mood and affect. Behavior is normal.  CBC    Component Value Date/Time   WBC 9.0 03/03/2016 1622   RBC 4.84 03/03/2016 1622   HGB 14.6 03/03/2016 1622   HCT 43.8 03/03/2016 1622   PLT 341.0 03/03/2016 1622   MCV 90.4 03/03/2016 1622   MCHC 33.4 03/03/2016 1622   RDW 13.1 03/03/2016 1622   LYMPHSABS 3.2 03/03/2016 1622   MONOABS 0.7 03/03/2016 1622   EOSABS 0.1 03/03/2016 1622   BASOSABS 0.1 03/03/2016 1622    CMP     Component Value Date/Time   NA 142 03/03/2016 1622   K 3.8 03/03/2016 1622   CL 104 03/03/2016 1622   CO2 32 03/03/2016 1622   GLUCOSE 94 03/03/2016 1622   BUN 15 03/03/2016 1622   CREATININE 0.79 03/03/2016 1622   CALCIUM 9.4 03/03/2016 1622   PROT 7.0 03/03/2016 1622   ALBUMIN 4.2 03/03/2016 1622   AST 21 03/03/2016 1622   ALT 19 03/03/2016 1622   ALKPHOS 56 03/03/2016 1622   BILITOT 0.7 03/03/2016 1622   GFRNONAA 80.88 04/14/2009 0808   GFRAA 115 10/29/2007 1126       Assessment & Plan:  55 year old female with past medical history of GERD, chronic constipation who is here for follow-up.   1. GERD -- Doing well back on Dexilant 60 mg daily. Trial to lower the dose eventually failed. This is despite GERD  diet and lifestyle modifications. In relation to the oral pharyngeal device, I reviewed this on the product website and it seems that it would be more appropriate for oral pharyngeal dysphagia and less effective than true GERD or esophageal dysmotility disorders. I do not think this is worth purchasing in light of her issues. Continue Dexilant 60 mg daily  2. Chronic constipation -- doing well on MiraLAX 17 g daily. Continue this dose  3. CRC screening -- up-to-date with next colonoscopy recommended  March 2022  1 yr follow-up 15 minutes spent with the patient today. Greater than 50% was spent in counseling and coordination of care with the patient

## 2016-04-08 NOTE — Patient Instructions (Signed)
We have sent the following medications to your pharmacy for you to pick up at your convenience: Dexilant Miralax  Please follow up with Dr Hilarie Fredrickson in 1 year.  If you are age 57 or older, your body mass index should be between 23-30. Your Body mass index is 25.17 kg/(m^2). If this is out of the aforementioned range listed, please consider follow up with your Primary Care Provider.  If you are age 74 or younger, your body mass index should be between 19-25. Your Body mass index is 25.17 kg/(m^2). If this is out of the aformentioned range listed, please consider follow up with your Primary Care Provider.

## 2016-04-22 ENCOUNTER — Other Ambulatory Visit (INDEPENDENT_AMBULATORY_CARE_PROVIDER_SITE_OTHER): Payer: Self-pay | Admitting: Otolaryngology

## 2016-04-22 DIAGNOSIS — J329 Chronic sinusitis, unspecified: Secondary | ICD-10-CM

## 2016-04-28 ENCOUNTER — Ambulatory Visit
Admission: RE | Admit: 2016-04-28 | Discharge: 2016-04-28 | Disposition: A | Discharge status: Home / Self Care | Payer: Managed Care, Other (non HMO) | Source: Ambulatory Visit | Attending: Otolaryngology | Admitting: Otolaryngology

## 2016-04-28 DIAGNOSIS — J329 Chronic sinusitis, unspecified: Secondary | ICD-10-CM

## 2016-05-12 ENCOUNTER — Encounter: Payer: Self-pay | Admitting: Cardiology

## 2016-05-12 ENCOUNTER — Ambulatory Visit (INDEPENDENT_AMBULATORY_CARE_PROVIDER_SITE_OTHER): Payer: 59 | Admitting: Cardiology

## 2016-05-12 VITALS — BP 118/70 | HR 101 | Ht 70.5 in | Wt 175.4 lb

## 2016-05-12 DIAGNOSIS — Q211 Atrial septal defect: Secondary | ICD-10-CM | POA: Diagnosis not present

## 2016-05-12 DIAGNOSIS — Q2111 Secundum atrial septal defect: Secondary | ICD-10-CM

## 2016-05-12 DIAGNOSIS — R0789 Other chest pain: Secondary | ICD-10-CM

## 2016-05-12 DIAGNOSIS — I8390 Asymptomatic varicose veins of unspecified lower extremity: Secondary | ICD-10-CM

## 2016-05-12 NOTE — Progress Notes (Signed)
PCP: Ann Held, DO  Clinic Note: Chief Complaint  Patient presents with  . Follow-up    Status post ASD repair    HPI: [name] is a 55 y.o. female with a PMH below who presents today for delayed f/u.[name] She is a long-standing patient of Dr. Chase Picket from the 1990s had a history of ASD repair by Dr. Servando Snare in the late 1989. She is also had varicose vein ablation surgery on RLE by Dr. Donnetta Hutching in 2008.  Last Echo - 2012.   [name] was last seen on January 2015.  Recent Hospitalizations: none  Studies Reviewed: none  Interval History: [name] presents today doing quite well without any major complaints. She is happy to tell me about her new grandson is now 18 months old. She really has not had any cardiac issues. She exercises about 3-5 days a week and walks several miles a day. She still teaches water aerobics - no chest tightness or shortness of breath with rest or exertion.  No PND, orthopnea or edema Really no major palpitations just after down then has some rapid heartbeats but nothing that bothers her. No rapid irregular heartbeats. No sick be/near syncope.  No syncope/near syncope, or TIA/amaurosis fugax symptoms.  No claudication.  ROS: A comprehensive was performed. Review of Systems  Constitutional: Negative.   HENT: Negative.   Respiratory: Negative.   Cardiovascular: Negative.   Gastrointestinal: Negative for blood in stool and melena.  Genitourinary: Negative for hematuria.  Musculoskeletal: Negative for myalgias, joint pain and falls.  Neurological: Negative for dizziness.  Endo/Heme/Allergies: Does not bruise/bleed easily.  Psychiatric/Behavioral: Negative.   All other systems reviewed and are negative.  Past Medical History  Diagnosis Date  . Loss of appetite   . Allergic rhinitis   . Back pain   . History of Ostium Secundum Atrial Septal Defect 1989    Status post repair in 1989/1990  . Varicose veins   . IBS (irritable  bowel syndrome)   . Intestinal volvulus [facility]) May 2007  . Endometriosis   . Hx of migraines   . Anxiety   . Onychomycosis   . Fatigue   . Contact lens/glasses fitting   . Incontinence   . Hiatal hernia   . GERD (gastroesophageal reflux disease)   . History of cardiac catheterization 12/1990    Past Surgical History  Procedure Laterality Date  . Asd repair, secundum  1990    Dr. Servando Snare  . Hysterectomy for severe endometriosis    . Right leg fracture with surgery      done with ankle surgery  . Varicose laser vein surgery  2009  . Right ankle surgery  2010  . Abdominal hysterectomy  2004  . Transthoracic echocardiogram   January 2012    Normal LV size and function, EF greater than 55%. Mild LA dilation. No intra-atrial shunt with bubble study. Normal pulmonary pressures. Only trace to mild mitral and tricuspid regurgitation.  . Nasal septum surgery      Dr. Georgia Lopes    Prior to Admission medications   Medication Sig Start Date End Date Taking? Authorizing Provider  ALPRAZolam Duanne Moron) 0.5 MG tablet take 1/2 to 1 tablet by mouth three times a day if needed for nerves 02/23/16   Rosalita Chessman Chase, DO  B Complex-Biotin-FA (B COMPLEX 100 TR PO) Take 1 tablet by mouth daily.      Historical Provider, MD  Calcium Carb-Cholecalciferol (CALTRATE 600+D) 600-800 MG-UNIT TABS Take  1 tablet by mouth 2 (two) times daily.    Historical Provider, MD  Calcium Glycerophosphate (PRELIEF) 340 (65-50) MG (CA-P) TABS Take 1 tablet by mouth as needed.    Historical Provider, MD  Ciclopirox 1 % shampoo Massage into scalp qd and leave in for 3 min then rinse 04/05/16   Yvonne R Lowne Chase, DO  cyclobenzaprine (FLEXERIL) 10 MG tablet Take 1 tablet (10 mg total) by mouth 3 (three) times daily as needed. 07/20/15   Rosalita Chessman Chase, DO  dexlansoprazole (DEXILANT) 60 MG capsule Take 1 capsule (60 mg total) by mouth daily. 03/03/16   Alferd Apa Lowne Chase, DO  DYMISTA 137-50 MCG/ACT SUSP Place 1 spray  into each nostril twice daily. 05/04/15   Historical Provider, MD  EPIPEN 2-PAK 0.3 MG/0.3ML SOAJ injection  04/01/14   Historical Provider, MD  FLUoxetine (PROZAC) 20 MG tablet Take 1 tablet (20 mg total) by mouth daily. 04/05/16   Rosalita Chessman Chase, DO  levocetirizine (XYZAL) 5 MG tablet  04/27/14   Historical Provider, MD  magnesium oxide (MAG-OX) 400 MG tablet Take 400 mg by mouth daily.    Historical Provider, MD  montelukast (SINGULAIR) 10 MG tablet  04/27/14   Historical Provider, MD  Multiple Vitamin (MULTIVITAMINS PO) Take 1 tablet by mouth daily.      Historical Provider, MD  Omega-3 Fatty Acids (FISH OIL) 1000 MG CAPS Take 1 capsule by mouth daily.      Historical Provider, MD  PAZEO 0.7 % SOLN  02/24/16   Historical Provider, MD  polyethylene glycol powder (MIRALAX) powder Take 17 g by mouth at bedtime.     Historical Provider, MD  Probiotic Product (PROBIOTIC DAILY PO) Take 1 capsule by mouth daily.     Historical Provider, MD   Allergies  Allergen Reactions  . Chlordiazepoxide-Clidinium     "loopy"  . Codeine     REACTION: nausea,vomitting and dizziness  . Penicillins     REACTION: hives ---taken as a child     Social History   Social History  . Marital Status: Married    Spouse Name: Richard  . Number of Children: N/A  . Years of Education: N/A   Occupational History  . SALES    Social History Main Topics  . Smoking status: Former Smoker -- 1.00 packs/day for 10 years    Types: Cigarettes    Quit date: 12/12/1988  . Smokeless tobacco: Never Used  . Alcohol Use: Yes     Comment: social use/wine  . Drug Use: No  . Sexual Activity: Not Asked   Other Topics Concern  . None   Social History Narrative   She has been married for 33 years. They have 2 children and at least one grandchild. She lives with her husband. She does all her activities of daily living. She works as a Midwife for [employer].     She is very active doing an exercise class with  a trainer 3 days a week at the gym, other than that she also teaches water aerobics. She does other days of the gym and she is able to at least 60 minutes a day 5 days a week.   She is a former smoker who quit in 1990. This was after smoking a pack a day for 10 years. She drinks social alcohol on occasion.   family history includes Clotting disorder in her maternal grandmother and sister; Colon cancer in her paternal grandfather; Diabetes in her  mother; Heart disease in her maternal grandfather and sister; Lung cancer in her maternal grandmother; Prostate cancer in her maternal grandfather.   Wt Readings from Last 3 Encounters:  05/12/16 175 lb 6.4 oz (79.561 kg)  04/08/16 178 lb (80.74 kg)  04/05/16 178 lb 3.2 oz (80.831 kg)    PHYSICAL EXAM BP 118/70 mmHg  Pulse 101  Ht 5' 10.5" (1.791 m)  Wt 175 lb 6.4 oz (79.561 kg)  BMI 24.80 kg/m2  SpO2 97% - ABX per minute my General appearance: alert, cooperative, appears stated age, no distress and Healthy-appearing. Well-nourished and well-groomed. Pleasant mood and affect. Neck: no adenopathy, no carotid bruit, no JVD, supple, symmetrical, trachea midline and HEENT: [place]/AT, EOMI, MMM, anicteric sclera Lungs: clear to auscultation bilaterally, normal percussion bilaterally and Nonlabored, good air movement Heart: regular rate and rhythm, S1, S2 normal, no murmur, click, rub or gallop and normal apical impulse Abdomen: soft, non-tender; bowel sounds normal; no masses, no organomegaly Extremities: extremities normal, atraumatic, no cyanosis or edema Pulses: 2+ and symmetric Neurologic: Alert and oriented X 3, normal strength and tone. Normal symmetric reflexes. Normal coordination and gait   Adult ECG Report  Rate: 101 ;  Rhythm: sinus tachycardia and Otherwise normal axis, intervals and durations;   Narrative Interpretation: Normal EKG  Other studies Reviewed: Additional studies/ records that were reviewed today include:  Recent Labs:   Followed by PCP     ASSESSMENT / PLAN: Problem List Items Addressed This Visit    VARICOSE VEINS, LOWER EXTREMITIES    No current complaints.      Chest pain, atypical - Primary    She really hasn't had any significant point. EEG. She does note when her heart rate goes up, but this is not very worrisome to her overall. No rapid irregular heartbeats. Only sinus tachycardia. She has had a history of costochondritis, no recurrent episodes.      Relevant Orders   EKG 12-Lead   ATRIAL SEPTAL DEFECT - Status Post Repair (Chronic)    Relatively normal echocardiogram in 2012. No leak. Could consider follow-up echocardiogram in the future, but I don't think it is necessary now with no murmur and no symptoms.      Relevant Orders   EKG 12-Lead      Current medicines are reviewed at length with the patient today. (+/- concerns) None The following changes have been made: None Studies Ordered:   Orders Placed This Encounter  Procedures  . EKG 12-Lead   Follow-up when necessary   Glenetta Hew, M.D., M.S. Interventional Cardiologist   Pager # [phone] Phone # [phone] [address] [place], [place] [address]

## 2016-05-12 NOTE — Patient Instructions (Signed)
NO CHANGE IN CURRENT TIMES  Your physician recommends that you schedule a follow-up appointment AS NEEDED BASIS.

## 2016-05-14 ENCOUNTER — Encounter: Payer: Self-pay | Admitting: Cardiology

## 2016-05-14 NOTE — Assessment & Plan Note (Signed)
No current complaints

## 2016-05-14 NOTE — Assessment & Plan Note (Signed)
She really hasn't had any significant point. EEG. She does note when her heart rate goes up, but this is not very worrisome to her overall. No rapid irregular heartbeats. Only sinus tachycardia. She has had a history of costochondritis, no recurrent episodes.

## 2016-05-14 NOTE — Assessment & Plan Note (Signed)
Relatively normal echocardiogram in 2012. No leak. Could consider follow-up echocardiogram in the future, but I don't think it is necessary now with no murmur and no symptoms.

## 2016-06-23 ENCOUNTER — Other Ambulatory Visit: Payer: Self-pay | Admitting: Family Medicine

## 2016-06-23 NOTE — Telephone Encounter (Signed)
Last seen 04/05/16 and filled 02/23/16 #90 UDS 02/18/15 low risk   Please advise     KP

## 2016-07-05 ENCOUNTER — Ambulatory Visit (INDEPENDENT_AMBULATORY_CARE_PROVIDER_SITE_OTHER): Payer: 59 | Admitting: Family Medicine

## 2016-07-05 ENCOUNTER — Encounter: Payer: Self-pay | Admitting: Family Medicine

## 2016-07-05 DIAGNOSIS — F430 Acute stress reaction: Secondary | ICD-10-CM

## 2016-07-05 MED ORDER — FLUOXETINE HCL 20 MG PO TABS: 20.0000 mg | ORAL_TABLET | Freq: Every day | ORAL | 3 refills | Status: DC

## 2016-07-05 NOTE — Progress Notes (Signed)
Subjective:    Patient ID: [name], female    DOB: [birth year], 55 y.o.   MRN: [id number]  Chief Complaint  Patient presents with  . Stress    follow up    HPI Patient is in today for f/u anxiety and stress.  Her husband does not want to work anything out and will not go to counseling.  She is filing separation papers this week.    The prozac is working well.  She only taking 1/2 xanax at night.     Past Medical History:  Diagnosis Date  . Allergic rhinitis   . Anxiety   . Back pain   . Contact lens/glasses fitting   . Endometriosis   . Fatigue   . GERD (gastroesophageal reflux disease)   . Hiatal hernia   . History of cardiac catheterization 12/1990  . History of Ostium Secundum Atrial Septal Defect 1989   Status post repair in 1989/1990  . Hx of migraines   . IBS (irritable bowel syndrome)   . Incontinence   . Intestinal volvulus [facility]) May 2007  . Loss of appetite   . Onychomycosis   . Varicose veins     Past Surgical History:  Procedure Laterality Date  . ABDOMINAL HYSTERECTOMY  2004  . ASD REPAIR, SECUNDUM  1990   Dr. Servando Snare  . BALLOON SINUPLASTY Left   . hysterectomy for severe endometriosis    . NASAL SEPTUM SURGERY     Dr. Georgia Lopes  . right ankle surgery  2010  . right leg fracture with surgery     done with ankle surgery  . TRANSTHORACIC ECHOCARDIOGRAM   January 2012   Normal LV size and function, EF greater than 55%. Mild LA dilation. No intra-atrial shunt with bubble study. Normal pulmonary pressures. Only trace to mild mitral and tricuspid regurgitation.  . varicose laser vein surgery  2009    Family History  Problem Relation Age of Onset  . Diabetes Mother   . Colon cancer Paternal Grandfather   . Heart disease Maternal Grandfather   . Prostate cancer Maternal Grandfather   . Lung cancer Maternal Grandmother     with mets to the brain  . Clotting disorder Maternal Grandmother   . Clotting disorder Sister   . Heart disease Sister       Social History   Social History  . Marital status: Married    Spouse name: Richard  . Number of children: N/A  . Years of education: N/A   Occupational History  . SALES [employer]   Social History Main Topics  . Smoking status: Former Smoker    Packs/day: 1.00    Years: 10.00    Types: Cigarettes    Quit date: 12/12/1988  . Smokeless tobacco: Never Used  . Alcohol use Yes     Comment: social use/wine  . Drug use: No  . Sexual activity: Not on file   Other Topics Concern  . Not on file   Social History Narrative   She has been married for 33 years. They have 2 children and at least one grandchild. She lives with her husband. She does all her activities of daily living. She works as a Midwife for [employer].     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 She does other days of the gym and she is able to  at least 60 minutes a day 5 days a week.   She is a former smoker who quit in 1990. This was after smoking a pack a day for 10 years. She drinks social alcohol on occasion.    Outpatient Medications Prior to Visit  Medication Sig Dispense Refill  . ALPRAZolam (XANAX) 0.5 MG tablet take 1/2 to 1 tablet by mouth three times a day if needed for nerves 90 tablet 0  . B Complex-Biotin-FA (B COMPLEX 100 TR PO) Take 1 tablet by mouth daily.      . Calcium Carb-Cholecalciferol (CALTRATE 600+D) 600-800 MG-UNIT TABS Take 1 tablet by mouth 2 (two) times daily.    . Calcium Glycerophosphate (PRELIEF) 340 (65-50) MG (CA-P) TABS Take 1 tablet by mouth as needed.    . Ciclopirox 1 % shampoo Massage into scalp qd and leave in for 3 min then rinse 120 mL 0  . cyclobenzaprine (FLEXERIL) 10 MG tablet Take 1 tablet (10 mg total) by mouth 3 (three) times daily as needed. 30 tablet 1  . dexlansoprazole (DEXILANT) 60 MG capsule Take 1 capsule (60 mg total) by mouth daily. 90 capsule  3  . DYMISTA 137-50 MCG/ACT SUSP Place 1 spray into each nostril twice daily.  0  . EPIPEN 2-PAK 0.3 MG/0.3ML SOAJ injection     . FLUoxetine (PROZAC) 20 MG tablet Take 1 tablet (20 mg total) by mouth daily. 30 tablet 3  . levocetirizine (XYZAL) 5 MG tablet     . magnesium oxide (MAG-OX) 400 MG tablet Take 400 mg by mouth daily.    . montelukast (SINGULAIR) 10 MG tablet     . Multiple Vitamin (MULTIVITAMINS PO) Take 1 tablet by mouth daily.      . Omega-3 Fatty Acids (FISH OIL) 1000 MG CAPS Take 1 capsule by mouth daily.      [name] PAZEO 0.7 % SOLN Place 1 drop into both eyes daily.   0  . polyethylene glycol powder (MIRALAX) powder Take 17 g by mouth at bedtime.     . Probiotic Product (PROBIOTIC DAILY PO) Take 1 capsule by mouth daily.      No facility-administered medications prior to visit.     Allergies  Allergen Reactions  . Chlordiazepoxide-Clidinium     "loopy"  . Codeine     REACTION: nausea,vomitting and dizziness  . Penicillins     REACTION: hives ---taken as a child    Review of Systems  Constitutional: Negative for chills, fever and malaise/fatigue.  HENT: Negative for congestion and hearing loss.   Eyes: Negative for discharge.  Respiratory: Negative for cough, sputum production and shortness of breath.   Cardiovascular: Negative for chest pain, palpitations and leg swelling.  Gastrointestinal: Negative for abdominal pain, blood in stool, constipation, diarrhea, heartburn, nausea and vomiting.  Genitourinary: Negative for dysuria, frequency, hematuria and urgency.  Musculoskeletal: Negative for back pain, falls and myalgias.  Skin: Negative for rash.  Neurological: Negative for dizziness, sensory change, loss of consciousness, weakness and headaches.  Endo/Heme/Allergies: Negative for environmental allergies. Does not bruise/bleed easily.  Psychiatric/Behavioral: Negative for depression and suicidal ideas. The patient is not nervous/anxious and does not have insomnia.         Objective:    Physical Exam  Constitutional: She is oriented to person, place, and time. She appears well-developed and well-nourished.  HENT:  Head: Normocephalic and atraumatic.  Eyes: Conjunctivae and EOM are normal.  Neck: Normal range of motion. Neck supple. No JVD present. Carotid bruit is  not present. No thyromegaly present.  Cardiovascular: Normal rate, regular rhythm and normal heart sounds.   No murmur heard. Pulmonary/Chest: Effort normal and breath sounds normal. No respiratory distress. She has no wheezes. She has no rales. She exhibits no tenderness.  Musculoskeletal: She exhibits no edema.  Neurological: She is alert and oriented to person, place, and time.  Psychiatric: Her mood appears anxious. She does not exhibit a depressed mood.  Nursing note and vitals reviewed.   BP 116/76 (BP Location: Left Arm, Patient Position: Sitting, Cuff Size: Normal)   Pulse (!) 56   Temp 99.3 F (37.4 C) (Oral)   Ht 5\' 11"  (1.803 m)   Wt 178 lb 12.8 oz (81.1 kg)   SpO2 99%   BMI 24.94 kg/m  Wt Readings from Last 3 Encounters:  07/05/16 178 lb 12.8 oz (81.1 kg)  05/12/16 175 lb 6.4 oz (79.6 kg)  04/08/16 178 lb (80.7 kg)     Lab Results  Component Value Date   WBC 9.0 03/03/2016   HGB 14.6 03/03/2016   HCT 43.8 03/03/2016   PLT 341.0 03/03/2016   GLUCOSE 94 03/03/2016   CHOL 150 03/03/2016   TRIG 63.0 03/03/2016   HDL 49.00 03/03/2016   LDLCALC 89 03/03/2016   ALT 19 03/03/2016   AST 21 03/03/2016   NA 142 03/03/2016   K 3.8 03/03/2016   CL 104 03/03/2016   CREATININE 0.79 03/03/2016   BUN 15 03/03/2016   CO2 32 03/03/2016   TSH 1.86 03/03/2016    Lab Results  Component Value Date   TSH 1.86 03/03/2016   Lab Results  Component Value Date   WBC 9.0 03/03/2016   HGB 14.6 03/03/2016   HCT 43.8 03/03/2016   MCV 90.4 03/03/2016   PLT 341.0 03/03/2016   Lab Results  Component Value Date   NA 142 03/03/2016   K 3.8 03/03/2016   CO2 32 03/03/2016    GLUCOSE 94 03/03/2016   BUN 15 03/03/2016   CREATININE 0.79 03/03/2016   BILITOT 0.7 03/03/2016   ALKPHOS 56 03/03/2016   AST 21 03/03/2016   ALT 19 03/03/2016   PROT 7.0 03/03/2016   ALBUMIN 4.2 03/03/2016   CALCIUM 9.4 03/03/2016   GFR 79.92 03/03/2016   Lab Results  Component Value Date   CHOL 150 03/03/2016   Lab Results  Component Value Date   HDL 49.00 03/03/2016   Lab Results  Component Value Date   LDLCALC 89 03/03/2016   Lab Results  Component Value Date   TRIG 63.0 03/03/2016   Lab Results  Component Value Date   CHOLHDL 3 03/03/2016   No results found for: HGBA1C     Assessment & Plan:   Problem List Items Addressed This Visit    None    Visit Diagnoses    Stress reaction        con't counseling con't prozac and occasional xanax rto 6 months or sooner prn  I am having [name] maintain her Fish Oil, Multiple Vitamin (MULTIVITAMINS PO), B Complex-Biotin-FA (B COMPLEX 100 TR PO), polyethylene glycol powder, Calcium Carb-Cholecalciferol, Probiotic Product (PROBIOTIC DAILY PO), Calcium Glycerophosphate, magnesium oxide, levocetirizine, montelukast, EPIPEN 2-PAK, DYMISTA, cyclobenzaprine, PAZEO, dexlansoprazole, Ciclopirox, FLUoxetine, and ALPRAZolam.  No orders of the defined types were placed in this encounter.    Ann Held, DO

## 2016-07-05 NOTE — Patient Instructions (Signed)
  Adjustment Disorder Adjustment disorder is an unusually severe reaction to a stressful life event, such as the loss of a job or physical illness. The event may be any stressful event other than the loss of a loved one. Adjustment disorder may affect your feelings, your thinking, how you act, or a combination of these. It may interfere with personal relationships or with the way you are at work, school, or home. People with this disorder are at risk for suicide and substance abuse. They may develop a more serious mental disorder, such as major depressive disorder or post-traumatic stress disorder. SIGNS AND SYMPTOMS  Symptoms may include:  Sadness, depressed mood, or crying spells.  Loss of enjoyment.  Change in appetite or weight.  Sense of loss or hopelessness.  Thoughts of suicide.  Anxiety, worry, or nervousness.  Trouble sleeping.  Avoiding family and friends.  Poor school performance.  Fighting or vandalism.  Reckless driving.  Skipping school.  Poor work performance.  Ignoring bills. Symptoms of adjustment disorder start within 3 months of the stressful life event. They do not last more than 6 months after the event has ended. DIAGNOSIS  To make a diagnosis, your health care provider will ask about what has happened in your life and how it has affected you. He or she may also ask about your medical history and use of medicines, alcohol, and other substances. Your health care provider may do a physical exam and order lab tests or other studies. You may be referred to a mental health specialist for evaluation. TREATMENT  Treatment options include:  Counseling or talk therapy. Talk therapy is usually provided by mental health specialists.  Medicine. Certain medicines may help with depression, anxiety, and sleep.  Support groups. Support groups offer emotional support, advice, and guidance. They are made up of people who have had similar experiences. HOME CARE  INSTRUCTIONS  Keep all follow-up visits as directed by your health care provider. This is important.  Take medicines only as directed by your health care provider. SEEK MEDICAL CARE IF:  Your symptoms get worse.  SEEK IMMEDIATE MEDICAL CARE IF: You have serious thoughts about hurting yourself or someone else. MAKE SURE YOU:  Understand these instructions.  Will watch your condition.  Will get help right away if you are not doing well or get worse.   This information is not intended to replace advice given to you by your health care provider. Make sure you discuss any questions you have with your health care provider.   Document Released: 08/02/2006 Document Revised: 12/19/2014 Document Reviewed: 04/22/2014 Elsevier Interactive Patient Education 2016 [employer]  

## 2016-10-10 ENCOUNTER — Ambulatory Visit (INDEPENDENT_AMBULATORY_CARE_PROVIDER_SITE_OTHER): Payer: 59 | Admitting: Medical

## 2016-10-10 VITALS — BP 104/59 | HR 71 | Temp 98.4°F | Ht 71.0 in | Wt 173.6 lb

## 2016-10-10 DIAGNOSIS — J209 Acute bronchitis, unspecified: Secondary | ICD-10-CM

## 2016-10-10 DIAGNOSIS — J040 Acute laryngitis: Secondary | ICD-10-CM | POA: Diagnosis not present

## 2016-10-10 DIAGNOSIS — J0100 Acute maxillary sinusitis, unspecified: Secondary | ICD-10-CM

## 2016-10-10 MED ORDER — FLUTICASONE PROPIONATE 50 MCG/ACT NA SUSP: 2.0000 | Freq: Every day | NASAL | 1 refills | Status: DC

## 2016-10-10 MED ORDER — DOXYCYCLINE HYCLATE 100 MG PO TABS: 100.0000 mg | ORAL_TABLET | Freq: Two times a day (BID) | ORAL | 0 refills | Status: DC

## 2016-10-10 MED ORDER — BENZONATATE 100 MG PO CAPS: 100.0000 mg | ORAL_CAPSULE | Freq: Three times a day (TID) | ORAL | 0 refills | Status: DC | PRN

## 2016-10-10 NOTE — Patient Instructions (Signed)
You appear to have bronchitis and sinusitis. Rest hydrate and tylenol for fever. I am prescribing cough medicine benzonatate , and doxycycline antibiotic. For your nasal congestion rx of flonase nasal steroid.   You should gradually get better. If not then notify us and would recommend a chest xray.  Please rest your voice. If laryngitis last more than 2 wks then consider ENT referral.  Follow up in 7-10 days or as needed

## 2016-10-10 NOTE — Progress Notes (Signed)
Pre visit review using our clinic review tool, if applicable. No additional management support is needed unless otherwise documented below in the visit note. 

## 2016-10-10 NOTE — Progress Notes (Signed)
Subjective:    Patient ID: [name], female    DOB: [birth year], 55 y.o.   MRN: [id number]  HPI  Pt in with hoarse voice since Friday. Pt had had nasal  congestion, pnd, and ear pressure.  Pt is coughing some mucous and blowing out mucous with some mild sinus pain. Ear pressure is moderate with sinus pressure.  Pt is trying motrin.  Left side sinus pain is the worse.    Review of Systems  Constitutional: Negative for chills, fatigue and fever.  HENT: Positive for congestion and sinus pressure. Negative for facial swelling, hearing loss, mouth sores, nosebleeds, postnasal drip, sneezing and sore throat.        Ear pressure.  Respiratory: Positive for cough. Negative for chest tightness, shortness of breath and wheezing.        Occasional cough.  Cardiovascular: Negative for chest pain and palpitations.  Musculoskeletal: Negative for back pain.  Skin: Negative for rash.  Neurological: Negative for dizziness and headaches.  Psychiatric/Behavioral: Negative for behavioral problems, confusion, hallucinations and sleep disturbance.    Past Medical History:  Diagnosis Date  . Allergic rhinitis   . Anxiety   . Back pain   . Contact lens/glasses fitting   . Endometriosis   . Fatigue   . GERD (gastroesophageal reflux disease)   . Hiatal hernia   . History of cardiac catheterization 12/1990  . History of Ostium Secundum Atrial Septal Defect 1989   Status post repair in 1989/1990  . Hx of migraines   . IBS (irritable bowel syndrome)   . Incontinence   . Intestinal volvulus [facility]) May 2007  . Loss of appetite   . Onychomycosis   . Varicose veins      Social History   Social History  . Marital status: Married    Spouse name: Richard  . Number of children: N/A  . Years of education: N/A   Occupational History  . SALES [employer]   Social History Main Topics  . Smoking status: Former Smoker    Packs/day: 1.00    Years: 10.00    Types: Cigarettes      Quit date: 12/12/1988  . Smokeless tobacco: Never Used  . Alcohol use Yes     Comment: social use/wine  . Drug use: No  . Sexual activity: Not on file   Other Topics Concern  . Not on file   Social History Narrative   She has been married for 33 years. They have 2 children and at least one grandchild. She lives with her husband. She does all her activities of daily living. She works as a Midwife for [employer].     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 She does other days of the gym and she is able to at least 60 minutes a day 5 days a week.   She is a former smoker who quit in 1990. This was after smoking a pack a day for 10 years. She drinks social alcohol on occasion.    Past Surgical History:  Procedure Laterality Date  . ABDOMINAL HYSTERECTOMY  2004  . ASD REPAIR, SECUNDUM  1990   Dr. Servando Snare  . BALLOON SINUPLASTY Left   . hysterectomy for severe endometriosis    . NASAL SEPTUM SURGERY     Dr. Georgia Lopes  . right ankle surgery  2010  .  right leg fracture with surgery     done with ankle surgery  . TRANSTHORACIC ECHOCARDIOGRAM   January 2012   Normal LV size and function, EF greater than 55%. Mild LA dilation. No intra-atrial shunt with bubble study. Normal pulmonary pressures. Only trace to mild mitral and tricuspid regurgitation.  . varicose laser vein surgery  2009    Family History  Problem Relation Age of Onset  . Diabetes Mother   . Colon cancer Paternal Grandfather   . Heart disease Maternal Grandfather   . Prostate cancer Maternal Grandfather   . Lung cancer Maternal Grandmother     with mets to the brain  . Clotting disorder Maternal Grandmother   . Clotting disorder Sister   . Heart disease Sister     Allergies  Allergen Reactions  . Chlordiazepoxide-Clidinium     "loopy"  . Codeine     REACTION: nausea,vomitting and dizziness  . Penicillins      REACTION: hives ---taken as a child    Current Outpatient Prescriptions on File Prior to Visit  Medication Sig Dispense Refill  . ALPRAZolam (XANAX) 0.5 MG tablet take 1/2 to 1 tablet by mouth three times a day if needed for nerves 90 tablet 0  . B Complex-Biotin-FA (B COMPLEX 100 TR PO) Take 1 tablet by mouth daily.      . Calcium Carb-Cholecalciferol (CALTRATE 600+D) 600-800 MG-UNIT TABS Take 1 tablet by mouth 2 (two) times daily.    . Calcium Glycerophosphate (PRELIEF) 340 (65-50) MG (CA-P) TABS Take 1 tablet by mouth as needed.    . Ciclopirox 1 % shampoo Massage into scalp qd and leave in for 3 min then rinse 120 mL 0  . cyclobenzaprine (FLEXERIL) 10 MG tablet Take 1 tablet (10 mg total) by mouth 3 (three) times daily as needed. 30 tablet 1  . dexlansoprazole (DEXILANT) 60 MG capsule Take 1 capsule (60 mg total) by mouth daily. 90 capsule 3  . DYMISTA 137-50 MCG/ACT SUSP Place 1 spray into each nostril twice daily.  0  . EPIPEN 2-PAK 0.3 MG/0.3ML SOAJ injection     . FLUoxetine (PROZAC) 20 MG tablet Take 1 tablet (20 mg total) by mouth daily. 90 tablet 3  . levocetirizine (XYZAL) 5 MG tablet     . magnesium oxide (MAG-OX) 400 MG tablet Take 400 mg by mouth daily.    . montelukast (SINGULAIR) 10 MG tablet     . Multiple Vitamin (MULTIVITAMINS PO) Take 1 tablet by mouth daily.      . Omega-3 Fatty Acids (FISH OIL) 1000 MG CAPS Take 1 capsule by mouth daily.      . polyethylene glycol powder (MIRALAX) powder Take 17 g by mouth at bedtime.     . Probiotic Product (PROBIOTIC DAILY PO) Take 1 capsule by mouth daily.      No current facility-administered medications on file prior to visit.     BP (!) 104/59 (BP Location: Left Arm, Patient Position: Sitting, Cuff Size: Normal)   Pulse 71   Temp 98.4 F (36.9 C) (Oral)   Ht 5\' 11"  (1.803 m)   Wt 173 lb 9.6 oz (78.7 kg)   SpO2 98%   BMI 24.21 kg/m       Objective:   Physical Exam   General  Mental Status - Alert. General  Appearance - Well groomed. Not in acute distress.  Skin Rashes- No Rashes.  HEENT Head- Normal. Ear Auditory Canal - Left- Normal. Right - Normal.Tympanic Membrane- Left- Normal.  Right- Normal. Eye Sclera/Conjunctiva- Left- Normal. Right- Normal. Nose & Sinuses Nasal Mucosa- Left-  Boggy and Congested. Right-  Boggy and  Congested.Bilateral maxillary pressure but no frontal sinus pressure. Mouth & Throat Lips: Upper Lip- Normal: no dryness, cracking, pallor, cyanosis, or vesicular eruption. Lower Lip-Normal: no dryness, cracking, pallor, cyanosis or vesicular eruption. Buccal Mucosa- Bilateral- No Aphthous ulcers. Oropharynx- No Discharge or Erythema. Tonsils: Characteristics- Bilateral- No Erythema or Congestion. Size/Enlargement- Bilateral- No enlargement. Discharge- bilateral-None.  Neck Neck- Supple. No Masses.   Chest and Lung Exam Auscultation: Breath Sounds:-Clear even and unlabored.  Cardiovascular Auscultation:Rythm- Regular, rate and rhythm. Murmurs & Other Heart Sounds:Ausculatation of the heart reveal- No Murmurs.  Lymphatic Head & Neck General Head & Neck Lymphatics: Bilateral: Description- No Localized lymphadenopathy.      Assessment & Plan:   You appear to have bronchitis and sinusitis. Rest hydrate and tylenol for fever. I am prescribing cough medicine benzonatate , and doxycycline antibiotic. For your nasal congestion rx of flonase nasal steroid.   You should gradually get better. If not then notify us and would recommend a chest xray.  Please rest your voice. If laryngitis last more than 2 wks then consider ENT referral.  Follow up in 7-10 days or as needed  Davene Jobin, Percell Miller, [employer]

## 2016-10-12 ENCOUNTER — Telehealth: Payer: Self-pay | Admitting: Family Medicine

## 2016-10-12 NOTE — Telephone Encounter (Signed)
Patient was prescribed doxycycline (VIBRA-TABS) 100 MG tablet and she has been taking it with food. She wanted to make sure that it was okay to do that. Please advise.   Patient was seen by Percell Miller on 10/10/16   Phone: [phone]

## 2016-10-12 NOTE — Telephone Encounter (Addendum)
Regarding taking doxycycline with water only would be best in terms of absorption.Food and particularly milk/calcium can inhibit absorption to some degree/little bit.   Avoid milk/dairy products/calcium(2 hours before or 2 hours  after doxycycline.  However, over years have seen a lot of side effects with doxy on empty stomach or with just water. Also would get your pharmacist opinion on this as well if she has concerns.  Also, how is she doing with recent illness. Does she feel better. Having less sinus pressure? How is her voice?

## 2016-10-14 NOTE — Telephone Encounter (Signed)
I spoke with patient and she states that she is feeling some better and she wanted to let Percell Miller know that he was a blessing and he made her feel better.

## 2016-11-24 ENCOUNTER — Other Ambulatory Visit: Payer: Self-pay | Admitting: Family Medicine

## 2016-11-25 ENCOUNTER — Telehealth: Payer: Self-pay

## 2016-11-25 NOTE — Telephone Encounter (Signed)
Rx faxed to pharmacy. LB

## 2017-01-18 ENCOUNTER — Telehealth: Payer: Self-pay | Admitting: Family Medicine

## 2017-01-18 DIAGNOSIS — Z0000 Encounter for general adult medical examination without abnormal findings: Secondary | ICD-10-CM

## 2017-01-18 NOTE — Telephone Encounter (Signed)
Pt scheduled her CPE for 03/17/17. She would like to come in the morning of to have her lab work completed so that she can eat.    Could orders please be placed for pt.

## 2017-01-19 NOTE — Telephone Encounter (Signed)
Orders placed and patient notified.

## 2017-02-03 DIAGNOSIS — H02535 Eyelid retraction left lower eyelid: Secondary | ICD-10-CM | POA: Diagnosis not present

## 2017-02-03 DIAGNOSIS — H02135 Senile ectropion of left lower eyelid: Secondary | ICD-10-CM | POA: Diagnosis not present

## 2017-03-13 ENCOUNTER — Other Ambulatory Visit: Payer: Self-pay | Admitting: Medical

## 2017-03-15 ENCOUNTER — Other Ambulatory Visit: Payer: Self-pay | Admitting: Family Medicine

## 2017-03-15 DIAGNOSIS — K219 Gastro-esophageal reflux disease without esophagitis: Secondary | ICD-10-CM

## 2017-03-17 ENCOUNTER — Other Ambulatory Visit (INDEPENDENT_AMBULATORY_CARE_PROVIDER_SITE_OTHER): Payer: 59

## 2017-03-17 ENCOUNTER — Ambulatory Visit (INDEPENDENT_AMBULATORY_CARE_PROVIDER_SITE_OTHER): Payer: 59 | Admitting: Family Medicine

## 2017-03-17 ENCOUNTER — Encounter: Payer: Self-pay | Admitting: Family Medicine

## 2017-03-17 VITALS — BP 110/65 | HR 53 | Temp 98.2°F | Resp 16 | Ht 71.0 in | Wt 173.4 lb

## 2017-03-17 DIAGNOSIS — F419 Anxiety disorder, unspecified: Secondary | ICD-10-CM

## 2017-03-17 DIAGNOSIS — Z0000 Encounter for general adult medical examination without abnormal findings: Secondary | ICD-10-CM

## 2017-03-17 DIAGNOSIS — R413 Other amnesia: Secondary | ICD-10-CM

## 2017-03-17 DIAGNOSIS — K219 Gastro-esophageal reflux disease without esophagitis: Secondary | ICD-10-CM

## 2017-03-17 DIAGNOSIS — Z87828 Personal history of other (healed) physical injury and trauma: Secondary | ICD-10-CM

## 2017-03-17 DIAGNOSIS — R8290 Unspecified abnormal findings in urine: Secondary | ICD-10-CM

## 2017-03-17 DIAGNOSIS — Z23 Encounter for immunization: Secondary | ICD-10-CM

## 2017-03-17 LAB — LIPID PANEL
Cholesterol: 150 mg/dL (ref 0–200)
HDL: 43.9 mg/dL (ref >39.00)
LDL Cholesterol: 86 mg/dL (ref 0–99)
NONHDL: 105.89
Total CHOL/HDL Ratio: 3
Triglycerides: 99 mg/dL (ref 0.0–149.0)
VLDL: 19.8 mg/dL (ref 0.0–40.0)

## 2017-03-17 LAB — TSH: TSH: 2.86 u[IU]/mL (ref 0.35–4.50)

## 2017-03-17 LAB — CBC WITH DIFFERENTIAL/PLATELET
BASOS ABS: 0 10*3/uL (ref 0.0–0.1)
Basophils Relative: 0.6 % (ref 0.0–3.0)
EOS ABS: 0.2 10*3/uL (ref 0.0–0.7)
Eosinophils Relative: 3.3 % (ref 0.0–5.0)
HEMATOCRIT: 41.9 % (ref 36.0–46.0)
Hemoglobin: 14 g/dL (ref 12.0–15.0)
Lymphocytes Relative: 38 % (ref 12.0–46.0)
Lymphs Abs: 2.8 10*3/uL (ref 0.7–4.0)
MCHC: 33.4 g/dL (ref 30.0–36.0)
MCV: 92.1 fl (ref 78.0–100.0)
Monocytes Absolute: 0.8 10*3/uL (ref 0.1–1.0)
Monocytes Relative: 11.4 % (ref 3.0–12.0)
NEUTROS ABS: 3.5 10*3/uL (ref 1.4–7.7)
NEUTROS PCT: 46.7 % (ref 43.0–77.0)
PLATELETS: 329 10*3/uL (ref 150.0–400.0)
RBC: 4.54 Mil/uL (ref 3.87–5.11)
RDW: 13.2 % (ref 11.5–15.5)
WBC: 7.4 10*3/uL (ref 4.0–10.5)

## 2017-03-17 LAB — POC URINALSYSI DIPSTICK (AUTOMATED)
Bilirubin, UA: NEGATIVE
Glucose, UA: NEGATIVE
Ketones, UA: NEGATIVE
Nitrite, UA: NEGATIVE
PROTEIN UA: NEGATIVE
RBC UA: NEGATIVE
SPEC GRAV UA: 1.025 (ref 1.030–1.035)
UROBILINOGEN UA: NEGATIVE (ref ?–2.0)
pH, UA: 6 (ref 5.0–8.0)

## 2017-03-17 LAB — COMPREHENSIVE METABOLIC PANEL
ALT: 22 U/L (ref 0–35)
AST: 23 U/L (ref 0–37)
Albumin: 4.2 g/dL (ref 3.5–5.2)
Alkaline Phosphatase: 45 U/L (ref 39–117)
BILIRUBIN TOTAL: 0.8 mg/dL (ref 0.2–1.2)
BUN: 17 mg/dL (ref 6–23)
CALCIUM: 9.1 mg/dL (ref 8.4–10.5)
CO2: 32 meq/L (ref 19–32)
CREATININE: 0.76 mg/dL (ref 0.40–1.20)
Chloride: 106 mEq/L (ref 96–112)
GFR: 83.26 mL/min (ref >60.00)
GLUCOSE: 82 mg/dL (ref 70–99)
Potassium: 3.9 mEq/L (ref 3.5–5.1)
Sodium: 143 mEq/L (ref 135–145)
Total Protein: 6.6 g/dL (ref 6.0–8.3)

## 2017-03-17 MED ORDER — FLUOXETINE HCL 10 MG PO TABS: 10.0000 mg | ORAL_TABLET | Freq: Every day | ORAL | 5 refills | Status: DC

## 2017-03-17 MED ORDER — DEXLANSOPRAZOLE 30 MG PO CPDR: 30.0000 mg | DELAYED_RELEASE_CAPSULE | Freq: Every day | ORAL | 5 refills | Status: DC

## 2017-03-17 NOTE — Progress Notes (Signed)
Pre visit review using our clinic review tool, if applicable. No additional management support is needed unless otherwise documented below in the visit note. 

## 2017-03-17 NOTE — Patient Instructions (Signed)
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
## 2017-03-18 ENCOUNTER — Ambulatory Visit (HOSPITAL_BASED_OUTPATIENT_CLINIC_OR_DEPARTMENT_OTHER): Payer: 59

## 2017-03-19 LAB — URINE CULTURE

## 2017-03-23 ENCOUNTER — Telehealth: Payer: Self-pay | Admitting: Family Medicine

## 2017-03-23 NOTE — Telephone Encounter (Signed)
MRI would not tell me whether these two meds will help her or not so can start meds and discuss with MRI with PMD when she returns.

## 2017-03-23 NOTE — Telephone Encounter (Signed)
Relation to QW:QVLD Call back number: [phone]   Reason for call:  PCP prescribe new medication Dexlansoprazole (DEXILANT) 30 MG capsule and FLUoxetine (PROZAC) 10 MG tablet   Patient would like to know should she wait to have the MRI and see if the new medication PCP prescribe will work or does it make a difference, please advise

## 2017-03-24 NOTE — Telephone Encounter (Signed)
Advised patient and patient will go ahead and have the MRI tomorrow and take the medication.  She has been on these medicines but at a higher dose. She was just recently decreased at her last visit.

## 2017-03-25 ENCOUNTER — Ambulatory Visit (HOSPITAL_BASED_OUTPATIENT_CLINIC_OR_DEPARTMENT_OTHER)
Admission: RE | Admit: 2017-03-25 | Discharge: 2017-03-25 | Disposition: A | Discharge status: Home / Self Care | Payer: 59 | Source: Ambulatory Visit | Attending: Family Medicine | Admitting: Family Medicine

## 2017-03-25 ENCOUNTER — Ambulatory Visit (HOSPITAL_BASED_OUTPATIENT_CLINIC_OR_DEPARTMENT_OTHER): Payer: 59

## 2017-03-25 DIAGNOSIS — R413 Other amnesia: Secondary | ICD-10-CM

## 2017-03-25 DIAGNOSIS — Z87828 Personal history of other (healed) physical injury and trauma: Secondary | ICD-10-CM

## 2017-03-28 DIAGNOSIS — H02135 Senile ectropion of left lower eyelid: Secondary | ICD-10-CM | POA: Diagnosis not present

## 2017-03-28 DIAGNOSIS — J019 Acute sinusitis, unspecified: Secondary | ICD-10-CM | POA: Diagnosis not present

## 2017-03-28 DIAGNOSIS — H02532 Eyelid retraction right lower eyelid: Secondary | ICD-10-CM | POA: Diagnosis not present

## 2017-03-29 ENCOUNTER — Telehealth: Payer: Self-pay | Admitting: Family Medicine

## 2017-03-29 NOTE — Telephone Encounter (Signed)
Relation to PE:TKKO Call back number:[phone]   Reason for call:   Patient inquiring about lab results, please advise

## 2017-03-30 NOTE — Telephone Encounter (Signed)
Patient informed. 

## 2017-04-04 DIAGNOSIS — J320 Chronic maxillary sinusitis: Secondary | ICD-10-CM | POA: Diagnosis not present

## 2017-04-04 DIAGNOSIS — J310 Chronic rhinitis: Secondary | ICD-10-CM | POA: Diagnosis not present

## 2017-04-08 ENCOUNTER — Ambulatory Visit (HOSPITAL_BASED_OUTPATIENT_CLINIC_OR_DEPARTMENT_OTHER): Payer: 59

## 2017-04-26 DIAGNOSIS — H04123 Dry eye syndrome of bilateral lacrimal glands: Secondary | ICD-10-CM | POA: Diagnosis not present

## 2017-05-16 ENCOUNTER — Ambulatory Visit: Payer: Self-pay

## 2017-05-23 DIAGNOSIS — J310 Chronic rhinitis: Secondary | ICD-10-CM | POA: Diagnosis not present

## 2017-05-23 DIAGNOSIS — J322 Chronic ethmoidal sinusitis: Secondary | ICD-10-CM | POA: Diagnosis not present

## 2017-06-12 ENCOUNTER — Other Ambulatory Visit: Payer: Self-pay | Admitting: Medical

## 2017-06-15 ENCOUNTER — Emergency Department (HOSPITAL_COMMUNITY): Payer: 59

## 2017-06-15 ENCOUNTER — Emergency Department (HOSPITAL_COMMUNITY)
Admission: EM | Admit: 2017-06-15 | Discharge: 2017-06-16 | Disposition: A | Discharge status: Home / Self Care | Payer: 59 | Attending: Emergency Medicine | Admitting: Emergency Medicine

## 2017-06-15 ENCOUNTER — Encounter (HOSPITAL_COMMUNITY): Payer: Self-pay

## 2017-06-15 DIAGNOSIS — M25531 Pain in right wrist: Secondary | ICD-10-CM | POA: Diagnosis not present

## 2017-06-15 DIAGNOSIS — Y999 Unspecified external cause status: Secondary | ICD-10-CM | POA: Insufficient documentation

## 2017-06-15 DIAGNOSIS — S52614A Nondisplaced fracture of right ulna styloid process, initial encounter for closed fracture: Secondary | ICD-10-CM | POA: Diagnosis not present

## 2017-06-15 DIAGNOSIS — Z87891 Personal history of nicotine dependence: Secondary | ICD-10-CM | POA: Insufficient documentation

## 2017-06-15 DIAGNOSIS — S52125A Nondisplaced fracture of head of left radius, initial encounter for closed fracture: Secondary | ICD-10-CM

## 2017-06-15 DIAGNOSIS — M79641 Pain in right hand: Secondary | ICD-10-CM | POA: Diagnosis not present

## 2017-06-15 DIAGNOSIS — Y929 Unspecified place or not applicable: Secondary | ICD-10-CM | POA: Insufficient documentation

## 2017-06-15 DIAGNOSIS — W010XXA Fall on same level from slipping, tripping and stumbling without subsequent striking against object, initial encounter: Secondary | ICD-10-CM | POA: Insufficient documentation

## 2017-06-15 DIAGNOSIS — S52571A Other intraarticular fracture of lower end of right radius, initial encounter for closed fracture: Secondary | ICD-10-CM | POA: Diagnosis not present

## 2017-06-15 DIAGNOSIS — Z79899 Other long term (current) drug therapy: Secondary | ICD-10-CM | POA: Diagnosis not present

## 2017-06-15 DIAGNOSIS — W19XXXA Unspecified fall, initial encounter: Secondary | ICD-10-CM

## 2017-06-15 DIAGNOSIS — Y9389 Activity, other specified: Secondary | ICD-10-CM | POA: Insufficient documentation

## 2017-06-15 DIAGNOSIS — S6992XA Unspecified injury of left wrist, hand and finger(s), initial encounter: Secondary | ICD-10-CM | POA: Diagnosis present

## 2017-06-15 DIAGNOSIS — S52124A Nondisplaced fracture of head of right radius, initial encounter for closed fracture: Secondary | ICD-10-CM | POA: Diagnosis not present

## 2017-06-15 NOTE — ED Triage Notes (Signed)
Pt reports she was walking her dog tonight and her dog pulled the leash to play with another dog and she states she fell down onto her right hand. She reports pain to right hand, wrist as well as pain to left elbow when she moves it. Swelling noted to right wrist.

## 2017-06-16 MED ORDER — FENTANYL CITRATE (PF) 100 MCG/2ML IJ SOLN
50.0000 ug | Freq: Once | INTRAMUSCULAR | Status: AC
  Administered 2017-06-16: 50 ug via INTRAMUSCULAR
  Filled 2017-06-16: qty 2

## 2017-06-16 MED ORDER — ACETAMINOPHEN 325 MG PO TABS
650.0000 mg | ORAL_TABLET | Freq: Once | ORAL | Status: AC
  Administered 2017-06-16: 650 mg via ORAL
  Filled 2017-06-16: qty 2

## 2017-06-16 MED ORDER — ONDANSETRON HCL 4 MG PO TABS: 4.0000 mg | ORAL_TABLET | Freq: Three times a day (TID) | ORAL | 0 refills | Status: DC | PRN

## 2017-06-16 MED ORDER — ONDANSETRON 4 MG PO TBDP
4.0000 mg | ORAL_TABLET | Freq: Once | ORAL | Status: AC
  Administered 2017-06-16: 4 mg via ORAL
  Filled 2017-06-16: qty 1

## 2017-06-16 MED ORDER — TRAMADOL HCL 50 MG PO TABS: 50.0000 mg | ORAL_TABLET | Freq: Four times a day (QID) | ORAL | 0 refills | Status: DC | PRN

## 2017-06-16 MED ORDER — ONDANSETRON 4 MG PO TBDP: 8.0000 mg | ORAL_TABLET | Freq: Once | ORAL | Status: DC

## 2017-06-16 MED ORDER — KETOROLAC TROMETHAMINE 60 MG/2ML IM SOLN
30.0000 mg | Freq: Once | INTRAMUSCULAR | Status: AC
  Administered 2017-06-16: 30 mg via INTRAMUSCULAR
  Filled 2017-06-16: qty 2

## 2017-06-16 NOTE — Progress Notes (Signed)
Orthopedic Tech Progress Note Patient Details:  [name] 1961-05-14 [id number]  Ortho Devices Type of Ortho Device: Post (long arm) splint, Sugartong splint, Arm sling Ortho Device/Splint Location: lue post long arm, rue sugartong Ortho Device/Splint Interventions: Ordered, Application, Adjustment   Karolee Stamps 06/16/2017, 2:25 AM

## 2017-06-16 NOTE — ED Provider Notes (Signed)
[place] DEPT Provider Note   CSN: [id number] Arrival date & time: 06/15/17  2155     History   Chief Complaint Chief Complaint  Patient presents with  . Fall  . Wrist Injury    HPI [name] is a 56 y.o. female who presents to the emergency department after fall earlier tonight. Patient states that she was walking her dog (australian labradoodle) when the dog pulled on the leash when trying to play with another dog, causing the patient to be twisted around and fall backwards onto outstretched hands. The patient had immediate right hand and wrist pain as well as left elbow pain that is worse with movement. There is swelling noted to the right wrist. The patient denies weakness, numbness, tingling. There is no abrasions or lacerations. Patient denies surgery to these areas previously.  HPI  Past Medical History:  Diagnosis Date  . Allergic rhinitis   . Anxiety   . Back pain   . Contact lens/glasses fitting   . Endometriosis   . Fatigue   . GERD (gastroesophageal reflux disease)   . Hiatal hernia   . History of cardiac catheterization 12/1990  . History of Ostium Secundum Atrial Septal Defect 1989   Status post repair in 1989/1990  . Hx of migraines   . IBS (irritable bowel syndrome)   . Incontinence   . Intestinal volvulus [facility]) May 2007  . Loss of appetite   . Onychomycosis   . Varicose veins     Patient Active Problem List   Diagnosis Date Noted  . Eczema 01/09/2016  . Dandruff 07/20/2015  . Acute bacterial sinusitis 05/13/2015  . Chest pain, atypical 12/20/2013  . URI (upper respiratory infection) 10/03/2012  . GERD (gastroesophageal reflux disease)-probable paroxsysmal relaxation LES 03/14/2012  . Annual physical exam 10/25/2011  . Hair loss 06/29/2011  . GERD 02/28/2011  . ALLERGIC RHINITIS 03/11/2010  . Backache 11/12/2009  . ENDOMETRIOSIS 03/06/2008  . VARICOSE VEINS, LOWER EXTREMITIES 01/08/2008  . INTESTINAL VOLVULUS, LARGE BOWEL  01/08/2008  . LEG PAIN 01/08/2008  . Anxiety 01/07/2008  . IRRITABLE BOWEL SYNDROME 01/07/2008  . MIGRAINES, HX OF 01/07/2008  . ATRIAL SEPTAL DEFECT - Status Post Repair 01/08/1988    Past Surgical History:  Procedure Laterality Date  . ABDOMINAL HYSTERECTOMY  2004  . ASD REPAIR, SECUNDUM  1990   Dr. Servando Snare  . BALLOON SINUPLASTY Left   . EYE SURGERY     muscle reattachment 02/2017  . hysterectomy for severe endometriosis    . NASAL SEPTUM SURGERY     Dr. Georgia Lopes  . right ankle surgery  2010  . right leg fracture with surgery     done with ankle surgery  . TRANSTHORACIC ECHOCARDIOGRAM   January 2012   Normal LV size and function, EF greater than 55%. Mild LA dilation. No intra-atrial shunt with bubble study. Normal pulmonary pressures. Only trace to mild mitral and tricuspid regurgitation.  . varicose laser vein surgery  2009    OB History    No data available       Home Medications    Prior to Admission medications   Medication Sig Start Date End Date Taking? Authorizing Provider  B Complex-Biotin-FA (B COMPLEX 100 TR PO) Take 1 tablet by mouth daily.      [provider]  Calcium Carb-Cholecalciferol (CALTRATE 600+D) 600-800 MG-UNIT TABS Take 1 tablet by mouth 2 (two) times daily.    [provider]  Calcium Glycerophosphate (PRELIEF) 340 (65-50) MG (CA-P)  TABS Take 1 tablet by mouth as needed.    [provider]  cycloSPORINE (RESTASIS) 0.05 % ophthalmic emulsion 1 drop 2 (two) times daily.    [provider]  Dexlansoprazole (DEXILANT) 30 MG capsule Take 1 capsule (30 mg total) by mouth daily. 03/17/17   Ann Held, DO  EPIPEN 2-PAK 0.3 MG/0.3ML SOAJ injection  04/01/14   [provider]  FLUoxetine (PROZAC) 10 MG tablet Take 1 tablet (10 mg total) by mouth daily. 03/17/17   Ann Held, DO  fluticasone (FLONASE) 50 MCG/ACT nasal spray instill 2 sprays into each nostril once daily 06/15/17   Saguier, Percell Miller,  PA-C  levocetirizine Harlow Ohms) 5 MG tablet  04/27/14   [provider]  magnesium oxide (MAG-OX) 400 MG tablet Take 400 mg by mouth daily.    [provider]  montelukast (SINGULAIR) 10 MG tablet  04/27/14   [provider]  Multiple Vitamin (MULTIVITAMINS PO) Take 1 tablet by mouth daily.      [provider]  Omega-3 Fatty Acids (FISH OIL) 1000 MG CAPS Take 1 capsule by mouth daily.      [provider]  ondansetron (ZOFRAN) 4 MG tablet Take 1 tablet (4 mg total) by mouth every 8 (eight) hours as needed for nausea or vomiting. 06/16/17   Jazelyn Sipe, Barth Kirks, PA-C  polyethylene glycol powder (MIRALAX) powder Take 17 g by mouth at bedtime.     [provider]  Probiotic Product (PROBIOTIC DAILY PO) Take 1 capsule by mouth daily.     [provider]  traMADol (ULTRAM) 50 MG tablet Take 1 tablet (50 mg total) by mouth every 6 (six) hours as needed. 06/16/17   Bernise Sylvain, Barth Kirks, PA-C    Family History Family History  Problem Relation Age of Onset  . Diabetes Mother   . Colon cancer Paternal Grandfather   . Heart disease Maternal Grandfather   . Prostate cancer Maternal Grandfather   . Lung cancer Maternal Grandmother        with mets to the brain  . Clotting disorder Maternal Grandmother   . Clotting disorder Sister   . Heart disease Sister     Social History Social History  Substance Use Topics  . Smoking status: Former Smoker    Packs/day: 1.00    Years: 10.00    Types: Cigarettes    Quit date: 12/12/1988  . Smokeless tobacco: Never Used  . Alcohol use Yes     Comment: social use/wine     Allergies   Chlordiazepoxide-clidinium; Codeine; and Penicillins   Review of Systems Review of Systems  Musculoskeletal: Positive for arthralgias.  Skin: Negative for wound.  Neurological: Negative for numbness.     Physical Exam Updated Vital Signs BP 104/65 (BP Location: Right Arm)   Pulse 64   Temp 98.2 F (36.8 C) (Oral)    Resp 16   Ht 5\' 8"  (1.727 m)   Wt 78.5 kg (173 lb)   SpO2 96%   BMI 26.30 kg/m   Physical Exam  Constitutional: She appears well-developed and well-nourished.  HENT:  Head: Normocephalic and atraumatic.  Right Ear: External ear normal.  Left Ear: External ear normal.  Eyes: Conjunctivae are normal. Right eye exhibits no discharge. Left eye exhibits no discharge. No scleral icterus.  Pulmonary/Chest: Effort normal. No respiratory distress.  Musculoskeletal:       Right elbow: Normal.      Left elbow: She exhibits decreased range of motion and  swelling. Tenderness found.       Right wrist: She exhibits decreased range of motion, tenderness, bony tenderness and swelling.       Left wrist: Normal.       Right hand: She exhibits normal range of motion, no tenderness and no bony tenderness. Normal sensation noted. Normal strength noted.       Left hand: She exhibits normal range of motion, no tenderness and normal capillary refill. Normal sensation noted. Normal strength noted.  The patient is neurovascular intact distally to areas of injury. Compartments long arms are soft bilaterally. There is no open wound.  Neurological: She is alert. She has normal strength. No sensory deficit.  Skin: No pallor.  Psychiatric: She has a normal mood and affect.  Nursing note and vitals reviewed.    ED Treatments / Results  Labs (all labs ordered are listed, but only abnormal results are displayed) Labs Reviewed - No data to display  EKG  EKG Interpretation None       Radiology Dg Elbow Complete Left  Result Date: 06/15/2017 CLINICAL DATA:  Acute left elbow pain following fall today. Initial encounter. EXAM: LEFT ELBOW - COMPLETE 3+ VIEW COMPARISON:  None. FINDINGS: A nondisplaced radial head fracture is present. A joint effusion is noted. There is no evidence of subluxation or dislocation. IMPRESSION: Nondisplaced radial head fracture with joint effusion. Electronically Signed   By: Margarette Canada M.D.   On: 06/15/2017 22:54   Dg Wrist Complete Right  Result Date: 06/15/2017 CLINICAL DATA:  Acute right wrist pain following fall. Initial encounter. EXAM: RIGHT WRIST - COMPLETE 3+ VIEW COMPARISON:  None. FINDINGS: A mildly comminuted intraarticular distal radial fracture and an ulnar styloid fracture noted. There is no evidence of dislocation. No other significant abnormalities are noted. IMPRESSION: Mildly comminuted intraarticular distal radial fracture. Ulnar styloid fracture. Electronically Signed   By: Margarette Canada M.D.   On: 06/15/2017 22:52   Dg Hand Complete Right  Result Date: 06/15/2017 CLINICAL DATA:  Acute right hand pain following fall. Initial encounter. EXAM: RIGHT HAND - COMPLETE 3+ VIEW COMPARISON:  None. FINDINGS: An intraarticular distal radial fracture is identified on the lateral view. No other fracture, subluxation or dislocation identified. No focal bony lesions are present. IMPRESSION: Intraarticular distal radial fracture. Electronically Signed   By: Margarette Canada M.D.   On: 06/15/2017 22:51    Procedures Procedures (including critical care time)  Medications Ordered in ED Medications  fentaNYL (SUBLIMAZE) injection 50 mcg (50 mcg Intramuscular Given 06/16/17 0103)     Initial Impression / Assessment and Plan / ED Course  I have reviewed the triage vital signs and the nursing notes.  Pertinent labs & imaging results that were available during my care of the patient were reviewed by me and considered in my medical decision making (see chart for details).     56 year old female presenting today after a fall earlier this afternoon. The patient fell backwards onto outstretched hands. Presented with right hand and wrist pain as well as left elbow pain. The patient is neurovascularly intact distally to areas of pain, with soft compartments along both arms bilaterally. There is no open wound. X-rays ordered for correlating areas ordered.   X-rays show mildly  comminuted intra-articular distal radial fracture and ulnar styloid fracture on right wrist x-ray. There is also a nondisplaced radial head fracture with joint effusion on left elbow x-ray. Consult to hand placed.  Dr. Fredna Dow return phone call and consulted that he can see  the patient in clinic early next week. Suggested that the patient be put into a sugar tong splint.  This patient into a sugar tong splint on the right side as well as a left posterior splint with sling. The patient was given pain medications in the emergency room.  Discharge the patient home with a referral for follow-up to Dr. Fredna Dow. We'll provide a short term supply of pain medications at home. The patient lives alone but will be staying with a coworker who will be helping taking care of the patient this week and next week. Discussed with patient that if she needs assistance that she could be admitted. She requests to go home.I advised the patient to return to the emergency department with new or worsening symptoms or new concerns. Specific return precautions discussed. The patient verbalized understanding and agreement with plan. All questions answered. No further questions at this time.   The patient was reviewed in the [place] controlled substance reporting system prior to prescribing narcotic pain medication.  This patient was discussed with Dr. Billy Fischer.  Final Clinical Impressions(s) / ED Diagnoses   Final diagnoses:  Fall, initial encounter  Other closed intra-articular fracture of distal end of right radius, initial encounter  Closed nondisplaced fracture of styloid process of right ulna, initial encounter  Closed nondisplaced fracture of head of left radius, initial encounter    New Prescriptions New Prescriptions   ONDANSETRON (ZOFRAN) 4 MG TABLET    Take 1 tablet (4 mg total) by mouth every 8 (eight) hours as needed for nausea or vomiting.   TRAMADOL (ULTRAM) 50 MG TABLET    Take 1 tablet (50 mg total)  by mouth every 6 (six) hours as needed.     Lorelle Gibbs 06/16/17 7017    Gareth Morgan, MD 06/18/17 2158

## 2017-06-16 NOTE — Discharge Instructions (Signed)
As we discussed you have multiple fractures that were seen on x-ray.  This includes a right intra-articular distal radial fracture, right ulnar styloid fracture, and left nondisplaced radial head fracture (elbow).  I placed her in splints for these fractures. Please follow splint care instructions attached.  Please follow-up with hand surgery. I provided information, please call in the morning to schedule an appointment to be seen.   For pain control you may take:  800mg  of ibuprofen (that is usually 4 over the counter pills)  3 times a day (take with food) and acetaminophen 975mg  (this is 3 over the counter pills) four times a day. Do not drink alcohol or combine with other medications that have acetaminophen as an ingredient (Read the labels!).  For breakthrough pain you may take tramadol. Do not drink alcohol drive or operate heavy machinery when taking tramadol.  If you develop worsening or new concerning symptoms you can return to the emergency department for re-evaluation.   Get help right away if: You have very bad pain when you stretch your fingers. You have fluid or a bad smell coming from your splint. Your hand or fingers get cold or turn pale or blue. You lose feeling in any part of your hand or arm.

## 2017-06-19 ENCOUNTER — Other Ambulatory Visit: Payer: Self-pay | Admitting: Orthopedic Surgery

## 2017-06-19 ENCOUNTER — Encounter (HOSPITAL_BASED_OUTPATIENT_CLINIC_OR_DEPARTMENT_OTHER): Payer: Self-pay | Admitting: *Deleted

## 2017-06-19 DIAGNOSIS — S52125A Nondisplaced fracture of head of left radius, initial encounter for closed fracture: Secondary | ICD-10-CM | POA: Diagnosis not present

## 2017-06-19 DIAGNOSIS — M25522 Pain in left elbow: Secondary | ICD-10-CM | POA: Diagnosis not present

## 2017-06-19 DIAGNOSIS — S52571A Other intraarticular fracture of lower end of right radius, initial encounter for closed fracture: Secondary | ICD-10-CM | POA: Diagnosis not present

## 2017-06-20 ENCOUNTER — Ambulatory Visit (HOSPITAL_BASED_OUTPATIENT_CLINIC_OR_DEPARTMENT_OTHER)
Admission: RE | Admit: 2017-06-20 | Discharge: 2017-06-20 | Disposition: A | Discharge status: Home / Self Care | Payer: 59 | Source: Ambulatory Visit | Attending: Orthopedic Surgery | Admitting: Orthopedic Surgery

## 2017-06-20 ENCOUNTER — Encounter (HOSPITAL_BASED_OUTPATIENT_CLINIC_OR_DEPARTMENT_OTHER)
Admission: RE | Disposition: A | Discharge status: Home / Self Care | Payer: Self-pay | Source: Ambulatory Visit | Attending: Orthopedic Surgery

## 2017-06-20 ENCOUNTER — Ambulatory Visit (HOSPITAL_BASED_OUTPATIENT_CLINIC_OR_DEPARTMENT_OTHER): Payer: 59 | Admitting: Certified Registered"

## 2017-06-20 ENCOUNTER — Encounter (HOSPITAL_BASED_OUTPATIENT_CLINIC_OR_DEPARTMENT_OTHER): Payer: Self-pay | Admitting: *Deleted

## 2017-06-20 DIAGNOSIS — Z79899 Other long term (current) drug therapy: Secondary | ICD-10-CM | POA: Diagnosis not present

## 2017-06-20 DIAGNOSIS — K219 Gastro-esophageal reflux disease without esophagitis: Secondary | ICD-10-CM | POA: Insufficient documentation

## 2017-06-20 DIAGNOSIS — K589 Irritable bowel syndrome without diarrhea: Secondary | ICD-10-CM | POA: Insufficient documentation

## 2017-06-20 DIAGNOSIS — W19XXXA Unspecified fall, initial encounter: Secondary | ICD-10-CM | POA: Diagnosis not present

## 2017-06-20 DIAGNOSIS — S52121A Displaced fracture of head of right radius, initial encounter for closed fracture: Secondary | ICD-10-CM | POA: Diagnosis not present

## 2017-06-20 DIAGNOSIS — F329 Major depressive disorder, single episode, unspecified: Secondary | ICD-10-CM | POA: Diagnosis not present

## 2017-06-20 DIAGNOSIS — S52125A Nondisplaced fracture of head of left radius, initial encounter for closed fracture: Secondary | ICD-10-CM | POA: Diagnosis not present

## 2017-06-20 DIAGNOSIS — Z90710 Acquired absence of both cervix and uterus: Secondary | ICD-10-CM | POA: Insufficient documentation

## 2017-06-20 DIAGNOSIS — Z87891 Personal history of nicotine dependence: Secondary | ICD-10-CM | POA: Diagnosis not present

## 2017-06-20 DIAGNOSIS — Z8249 Family history of ischemic heart disease and other diseases of the circulatory system: Secondary | ICD-10-CM | POA: Insufficient documentation

## 2017-06-20 DIAGNOSIS — Z885 Allergy status to narcotic agent status: Secondary | ICD-10-CM | POA: Diagnosis not present

## 2017-06-20 DIAGNOSIS — Z832 Family history of diseases of the blood and blood-forming organs and certain disorders involving the immune mechanism: Secondary | ICD-10-CM | POA: Insufficient documentation

## 2017-06-20 DIAGNOSIS — Z880 Allergy status to penicillin: Secondary | ICD-10-CM | POA: Insufficient documentation

## 2017-06-20 DIAGNOSIS — K449 Diaphragmatic hernia without obstruction or gangrene: Secondary | ICD-10-CM | POA: Insufficient documentation

## 2017-06-20 DIAGNOSIS — Z8042 Family history of malignant neoplasm of prostate: Secondary | ICD-10-CM | POA: Diagnosis not present

## 2017-06-20 DIAGNOSIS — Z800 Family history of malignant neoplasm of digestive organs: Secondary | ICD-10-CM | POA: Insufficient documentation

## 2017-06-20 DIAGNOSIS — S52571A Other intraarticular fracture of lower end of right radius, initial encounter for closed fracture: Secondary | ICD-10-CM | POA: Insufficient documentation

## 2017-06-20 DIAGNOSIS — Z9889 Other specified postprocedural states: Secondary | ICD-10-CM | POA: Insufficient documentation

## 2017-06-20 DIAGNOSIS — S52122A Displaced fracture of head of left radius, initial encounter for closed fracture: Secondary | ICD-10-CM | POA: Diagnosis not present

## 2017-06-20 DIAGNOSIS — J309 Allergic rhinitis, unspecified: Secondary | ICD-10-CM | POA: Diagnosis not present

## 2017-06-20 DIAGNOSIS — Z888 Allergy status to other drugs, medicaments and biological substances status: Secondary | ICD-10-CM | POA: Insufficient documentation

## 2017-06-20 DIAGNOSIS — Z7951 Long term (current) use of inhaled steroids: Secondary | ICD-10-CM | POA: Insufficient documentation

## 2017-06-20 DIAGNOSIS — F419 Anxiety disorder, unspecified: Secondary | ICD-10-CM | POA: Insufficient documentation

## 2017-06-20 HISTORY — DX: Major depressive disorder, single episode, unspecified: F32.9

## 2017-06-20 HISTORY — PX: OPEN REDUCTION INTERNAL FIXATION (ORIF) DISTAL RADIAL FRACTURE: SHX5989

## 2017-06-20 HISTORY — DX: Depression, unspecified: F32.A

## 2017-06-20 SURGERY — OPEN REDUCTION INTERNAL FIXATION (ORIF) DISTAL RADIUS FRACTURE
Anesthesia: Monitor Anesthesia Care | Site: Wrist | Laterality: Right

## 2017-06-20 MED ORDER — ROPIVACAINE HCL 5 MG/ML IJ SOLN
INTRAMUSCULAR | Status: DC | PRN
  Administered 2017-06-20: 30 mL via PERINEURAL

## 2017-06-20 MED ORDER — MIDAZOLAM HCL 2 MG/2ML IJ SOLN
1.0000 mg | INTRAMUSCULAR | Status: DC | PRN
  Administered 2017-06-20: 2 mg via INTRAVENOUS

## 2017-06-20 MED ORDER — CHLORHEXIDINE GLUCONATE 4 % EX LIQD: 60.0000 mL | Freq: Once | CUTANEOUS | Status: DC

## 2017-06-20 MED ORDER — PROPOFOL 500 MG/50ML IV EMUL
INTRAVENOUS | Status: DC | PRN
  Administered 2017-06-20: 40 ug/kg/min via INTRAVENOUS

## 2017-06-20 MED ORDER — VANCOMYCIN HCL IN DEXTROSE 1-5 GM/200ML-% IV SOLN
1000.0000 mg | INTRAVENOUS | Status: AC
  Administered 2017-06-20: 1000 mg via INTRAVENOUS

## 2017-06-20 MED ORDER — VANCOMYCIN HCL IN DEXTROSE 1-5 GM/200ML-% IV SOLN
INTRAVENOUS | Status: AC
Start: 2017-06-20 — End: 2017-06-20
  Filled 2017-06-20: qty 200

## 2017-06-20 MED ORDER — LACTATED RINGERS IV SOLN
INTRAVENOUS | Status: DC
  Administered 2017-06-20: 13:00:00 via INTRAVENOUS

## 2017-06-20 MED ORDER — FENTANYL CITRATE (PF) 100 MCG/2ML IJ SOLN
50.0000 ug | INTRAMUSCULAR | Status: DC | PRN
  Administered 2017-06-20: 100 ug via INTRAVENOUS

## 2017-06-20 MED ORDER — PROMETHAZINE HCL 25 MG/ML IJ SOLN: 6.2500 mg | INTRAMUSCULAR | Status: DC | PRN

## 2017-06-20 MED ORDER — HYDROMORPHONE HCL 1 MG/ML IJ SOLN: 0.2500 mg | INTRAMUSCULAR | Status: DC | PRN

## 2017-06-20 MED ORDER — ONDANSETRON HCL 4 MG/2ML IJ SOLN
INTRAMUSCULAR | Status: DC | PRN
  Administered 2017-06-20: 4 mg via INTRAVENOUS

## 2017-06-20 MED ORDER — LIDOCAINE HCL (CARDIAC) 20 MG/ML IV SOLN
INTRAVENOUS | Status: DC | PRN
  Administered 2017-06-20: 30 mg via INTRAVENOUS

## 2017-06-20 MED ORDER — OXYCODONE HCL 5 MG/5ML PO SOLN: 5.0000 mg | Freq: Once | ORAL | Status: DC | PRN

## 2017-06-20 MED ORDER — SCOPOLAMINE 1 MG/3DAYS TD PT72: 1.0000 | MEDICATED_PATCH | Freq: Once | TRANSDERMAL | Status: DC | PRN

## 2017-06-20 MED ORDER — FENTANYL CITRATE (PF) 100 MCG/2ML IJ SOLN
INTRAMUSCULAR | Status: AC
  Filled 2017-06-20: qty 2

## 2017-06-20 MED ORDER — MIDAZOLAM HCL 2 MG/2ML IJ SOLN
INTRAMUSCULAR | Status: AC
  Filled 2017-06-20: qty 2

## 2017-06-20 MED ORDER — OXYCODONE HCL 5 MG PO TABS: 5.0000 mg | ORAL_TABLET | Freq: Once | ORAL | Status: DC | PRN

## 2017-06-20 MED ORDER — TRAMADOL HCL 50 MG PO TABS: 50.0000 mg | ORAL_TABLET | Freq: Four times a day (QID) | ORAL | 0 refills | Status: DC | PRN

## 2017-06-20 SURGICAL SUPPLY — 67 items
BANDAGE ACE 3X5.8 VEL STRL LF (GAUZE/BANDAGES/DRESSINGS) ×2 IMPLANT
BIT DRILL 2.0 LNG QUCK RELEASE (BIT) IMPLANT
BIT DRILL 2.8X5 QR DISP (BIT) ×1 IMPLANT
BLADE SURG 15 STRL LF DISP TIS (BLADE) ×2 IMPLANT
BLADE SURG 15 STRL SS (BLADE) ×4
BNDG CMPR 9X4 STRL LF SNTH (GAUZE/BANDAGES/DRESSINGS) ×1
BNDG ESMARK 4X9 LF (GAUZE/BANDAGES/DRESSINGS) ×2 IMPLANT
BNDG GAUZE ELAST 4 BULKY (GAUZE/BANDAGES/DRESSINGS) ×2 IMPLANT
BNDG PLASTER X FAST 3X3 WHT LF (CAST SUPPLIES) ×20 IMPLANT
BNDG PLSTR 9X3 FST ST WHT (CAST SUPPLIES) ×10
CHLORAPREP W/TINT 26ML (MISCELLANEOUS) ×2 IMPLANT
CORD BIPOLAR FORCEPS 12FT (ELECTRODE) ×2 IMPLANT
COVER BACK TABLE 60X90IN (DRAPES) ×2 IMPLANT
COVER MAYO STAND STRL (DRAPES) ×2 IMPLANT
CUFF TOURNIQUET SINGLE 18IN (TOURNIQUET CUFF) ×1 IMPLANT
CUFF TOURNIQUET SINGLE 24IN (TOURNIQUET CUFF) IMPLANT
DRAPE EXTREMITY T 121X128X90 (DRAPE) ×2 IMPLANT
DRAPE OEC MINIVIEW 54X84 (DRAPES) ×2 IMPLANT
DRAPE SURG 17X23 STRL (DRAPES) ×2 IMPLANT
DRILL 2.0 LNG QUICK RELEASE (BIT) ×2
GAUZE SPONGE 4X4 12PLY STRL (GAUZE/BANDAGES/DRESSINGS) ×2 IMPLANT
GAUZE XEROFORM 1X8 LF (GAUZE/BANDAGES/DRESSINGS) ×2 IMPLANT
GLOVE BIO SURGEON STRL SZ7.5 (GLOVE) ×2 IMPLANT
GLOVE BIOGEL PI IND STRL 8 (GLOVE) ×1 IMPLANT
GLOVE BIOGEL PI IND STRL 8.5 (GLOVE) IMPLANT
GLOVE BIOGEL PI INDICATOR 8 (GLOVE) ×1
GLOVE BIOGEL PI INDICATOR 8.5 (GLOVE)
GLOVE SURG ORTHO 8.0 STRL STRW (GLOVE) IMPLANT
GOWN STRL REUS W/ TWL LRG LVL3 (GOWN DISPOSABLE) ×1 IMPLANT
GOWN STRL REUS W/TWL LRG LVL3 (GOWN DISPOSABLE) ×2
GOWN STRL REUS W/TWL XL LVL3 (GOWN DISPOSABLE) ×2 IMPLANT
GUIDEWIRE ORTHO 0.054X6 (WIRE) ×3 IMPLANT
NDL HYPO 25X1 1.5 SAFETY (NEEDLE) IMPLANT
NEEDLE HYPO 25X1 1.5 SAFETY (NEEDLE) IMPLANT
NS IRRIG 1000ML POUR BTL (IV SOLUTION) ×2 IMPLANT
PACK BASIN DAY SURGERY FS (CUSTOM PROCEDURE TRAY) ×2 IMPLANT
PAD CAST 3X4 CTTN HI CHSV (CAST SUPPLIES) ×1 IMPLANT
PADDING CAST ABS 4INX4YD NS (CAST SUPPLIES) ×1
PADDING CAST ABS COTTON 4X4 ST (CAST SUPPLIES) ×1 IMPLANT
PADDING CAST COTTON 3X4 STRL (CAST SUPPLIES) ×2
PLATE R NARROW PROC VDR (Plate) ×1 IMPLANT
SCREW ACTK 2 NL HEX 3.5.11 (Screw) ×1 IMPLANT
SCREW CORT FT 18X2.3XLCK HEX (Screw) IMPLANT
SCREW CORT FT 20X2.3XLCK HEX (Screw) IMPLANT
SCREW CORTICAL LOCKING 2.3X18M (Screw) ×8 IMPLANT
SCREW CORTICAL LOCKING 2.3X20M (Screw) ×4 IMPLANT
SCREW FX18X2.3XSMTH LCK NS CRT (Screw) IMPLANT
SCREW FX20X2.3XSMTH LCK NS CRT (Screw) IMPLANT
SCREW HEXALOBE NON-LOCK 3.5X14 (Screw) ×1 IMPLANT
SCREW NONLOCK HEX 3.5X12 (Screw) ×1 IMPLANT
SLEEVE SCD COMPRESS KNEE MED (MISCELLANEOUS) IMPLANT
STOCKINETTE 4X48 STRL (DRAPES) ×2 IMPLANT
SUCTION FRAZIER HANDLE 10FR (MISCELLANEOUS)
SUCTION TUBE FRAZIER 10FR DISP (MISCELLANEOUS) IMPLANT
SUT ETHILON 3 0 PS 1 (SUTURE) IMPLANT
SUT ETHILON 4 0 PS 2 18 (SUTURE) ×2 IMPLANT
SUT VIC AB 2-0 SH 27 (SUTURE) ×2
SUT VIC AB 2-0 SH 27XBRD (SUTURE) IMPLANT
SUT VIC AB 3-0 PS1 18 (SUTURE)
SUT VIC AB 3-0 PS1 18XBRD (SUTURE) IMPLANT
SUT VICRYL 4-0 PS2 18IN ABS (SUTURE) ×2 IMPLANT
SYR BULB 3OZ (MISCELLANEOUS) ×2 IMPLANT
SYR CONTROL 10ML LL (SYRINGE) IMPLANT
TOWEL OR 17X24 6PK STRL BLUE (TOWEL DISPOSABLE) ×4 IMPLANT
TOWEL OR NON WOVEN STRL DISP B (DISPOSABLE) ×2 IMPLANT
TUBE CONNECTING 20X1/4 (TUBING) IMPLANT
UNDERPAD 30X30 (UNDERPADS AND DIAPERS) ×2 IMPLANT

## 2017-06-20 NOTE — Anesthesia Procedure Notes (Signed)
Anesthesia Regional Block: Supraclavicular block   Pre-Anesthetic Checklist: ,, timeout performed, Correct Patient, Correct Site, Correct Laterality, Correct Procedure, Correct Position, site marked, Risks and benefits discussed,  Surgical consent,  Pre-op evaluation,  At surgeon's request and post-op pain management  Laterality: Right  Prep: Dura Prep       Needles:  Injection technique: Single-shot  Needle Type: Stimiplex     Needle Length: 9cm  Needle Gauge: 21     Additional Needles:   Procedures: ultrasound guided,,,,,,,,  Narrative:  Start time: 06/20/2017 1:02 PM End time: 06/20/2017 1:07 PM Injection made incrementally with aspirations every 5 mL.  Performed by: Personally  Anesthesiologist: Candida Peeling RAY

## 2017-06-20 NOTE — H&P (Signed)
[name] is an 56 y.o. female.   Chief Complaint: right distal radius fracture HPI: [name] is a 56 year old right-hand-dominant female who is present with her boyfriend and a friend.  She states she fell while walking her dog 5 days ago injuring her right wrist and left elbow.  She was seen at the emergency department where radiograph were taken revealing right distal radius fracture in the left radial head fracture.  She is placed in splints and is here for follow-up which reports no previous injuries to the arms and no other injuries at this time.  She states her right arm is sore and rates it 4 out of 10.  Her symptoms are alleviated with medication and a splint and aggravated with use.  Allergies:  Allergies  Allergen Reactions  . Chlordiazepoxide-Clidinium     "loopy"  . Codeine     REACTION: nausea,vomitting and dizziness  . Penicillins     REACTION: hives ---taken as a child    Past Medical History:  Diagnosis Date  . Allergic rhinitis   . Anxiety   . Back pain   . Contact lens/glasses fitting   . Depression   . Endometriosis   . Fatigue   . GERD (gastroesophageal reflux disease)   . Hiatal hernia   . History of cardiac catheterization 12/1990  . History of Ostium Secundum Atrial Septal Defect 1989   Status post repair in 1989/1990  . Hx of migraines   . IBS (irritable bowel syndrome)   . Incontinence   . Intestinal volvulus [facility]) May 2007  . Loss of appetite   . Onychomycosis   . Varicose veins     Past Surgical History:  Procedure Laterality Date  . ABDOMINAL HYSTERECTOMY  2004  . ASD REPAIR, SECUNDUM  1990   Dr. Servando Snare  . BALLOON SINUPLASTY Left   . EYE SURGERY     muscle reattachment 02/2017  . hysterectomy for severe endometriosis    . NASAL SEPTUM SURGERY     Dr. Georgia Lopes  . right ankle surgery  2010  . right leg fracture with surgery     done with ankle surgery  . TRANSTHORACIC ECHOCARDIOGRAM   January 2012   Normal LV size and  function, EF greater than 55%. Mild LA dilation. No intra-atrial shunt with bubble study. Normal pulmonary pressures. Only trace to mild mitral and tricuspid regurgitation.  . varicose laser vein surgery  2009    Family History: Family History  Problem Relation Age of Onset  . Diabetes Mother   . Colon cancer Paternal Grandfather   . Heart disease Maternal Grandfather   . Prostate cancer Maternal Grandfather   . Lung cancer Maternal Grandmother        with mets to the brain  . Clotting disorder Maternal Grandmother   . Clotting disorder Sister   . Heart disease Sister     Social History:   reports that she quit smoking about 28 years ago. Her smoking use included Cigarettes. She has a 10.00 pack-year smoking history. She has never used smokeless tobacco. She reports that she drinks alcohol. She reports that she does not use drugs.  Medications: Medications Prior to Admission  Medication Sig Dispense Refill  . Calcium Carb-Cholecalciferol (CALTRATE 600+D) 600-800 MG-UNIT TABS Take 1 tablet by mouth 2 (two) times daily.    . cycloSPORINE (RESTASIS) 0.05 % ophthalmic emulsion 1 drop 2 (two) times daily.    [name] Dexlansoprazole (DEXILANT) 30 MG capsule Take 1 capsule (  30 mg total) by mouth daily. 30 capsule 5  . FLUoxetine (PROZAC) 10 MG tablet Take 1 tablet (10 mg total) by mouth daily. 30 tablet 5  . fluticasone (FLONASE) 50 MCG/ACT nasal spray instill 2 sprays into each nostril once daily 16 g 1  . levocetirizine (XYZAL) 5 MG tablet     . magnesium oxide (MAG-OX) 400 MG tablet Take 400 mg by mouth daily.    . montelukast (SINGULAIR) 10 MG tablet     . Multiple Vitamin (MULTIVITAMINS PO) Take 1 tablet by mouth daily.      . traMADol (ULTRAM) 50 MG tablet Take 1 tablet (50 mg total) by mouth every 6 (six) hours as needed. 15 tablet 0  . EPIPEN 2-PAK 0.3 MG/0.3ML SOAJ injection       No results found for this or any previous visit (from the past 48 hour(s)).  No results found.   A  comprehensive review of systems was negative.  Blood pressure 131/71, pulse (!) 47, temperature 98.6 F (37 C), temperature source Oral, resp. rate 18, height 5\' 8"  (1.727 m), weight 78.5 kg (173 lb), SpO2 100 %.  General appearance: alert, cooperative and appears stated age Head: Normocephalic, without obvious abnormality, atraumatic Neck: supple, symmetrical, trachea midline Resp: clear to auscultation bilaterally Cardio: regular rate and rhythm GI: non-tender Extremities: Intact sensation and capillary refill all digits.  +epl/fpl/io.  No wounds. Pulses: 2+ and symmetric Skin: Skin color, texture, turgor normal. No rashes or lesions Neurologic: Grossly normal Incision/Wound:none  Assessment/Plan Right distal radius fracture.  Non operative and operative treatment options were discussed with the patient and patient wishes to proceed with operative treatment. Risks, benefits, and alternatives of surgery were discussed and the patient agrees with the plan of care.   Eathan Groman R 06/20/2017, 12:44 PM

## 2017-06-20 NOTE — Anesthesia Procedure Notes (Signed)
Procedure Name: MAC Date/Time: 06/20/2017 1:52 PM Performed by: Derriana Oser D Pre-anesthesia Checklist: Patient identified, Emergency Drugs available, Suction available, Patient being monitored and Timeout performed Patient Re-evaluated:Patient Re-evaluated prior to inductionOxygen Delivery Method: Simple face mask

## 2017-06-20 NOTE — Transfer of Care (Signed)
Immediate Anesthesia Transfer of Care Note  Patient: [name]  Procedure(s) Performed: Procedure(s): OPEN REDUCTION INTERNAL FIXATION (ORIF) DISTAL RADIAL FRACTURE (Right)  Patient Location: PACU  Anesthesia Type:MAC combined with regional for post-op pain  Level of Consciousness: awake, alert , oriented and patient cooperative  Airway & Oxygen Therapy: Patient Spontanous Breathing and Patient connected to face mask oxygen  Post-op Assessment: Report given to RN and Post -op Vital signs reviewed and stable  Post vital signs: Reviewed and stable  Last Vitals:  Vitals:   06/20/17 1308 06/20/17 1309  BP:    Pulse: (!) 55 (!) 56  Resp: 10 11  Temp:      Last Pain:  Vitals:   06/20/17 1226  TempSrc: Oral  PainSc: 3       Patients Stated Pain Goal: 0 (83/72/90 2111)  Complications: No apparent anesthesia complications

## 2017-06-20 NOTE — Anesthesia Postprocedure Evaluation (Signed)
Anesthesia Post Note  Patient: [name]  Procedure(s) Performed: Procedure(s) (LRB): OPEN REDUCTION INTERNAL FIXATION (ORIF) DISTAL RADIAL FRACTURE (Right)     Patient location during evaluation: PACU Anesthesia Type: Regional Level of consciousness: awake and alert Pain management: pain level controlled Vital Signs Assessment: post-procedure vital signs reviewed and stable Respiratory status: spontaneous breathing Cardiovascular status: stable Anesthetic complications: no    Last Vitals:  Vitals:   06/20/17 1500 06/20/17 1515  BP: (!) 143/70 (!) 141/70  Pulse: (!) 51 (!) 51  Resp: 11 14  Temp: 36.9 C     Last Pain:  Vitals:   06/20/17 1500  TempSrc:   PainSc: 0-No pain                 Nolon Nations

## 2017-06-20 NOTE — Discharge Instructions (Addendum)
Post Anesthesia Home Care Instructions  Activity: Get plenty of rest for the remainder of the day. A responsible individual must stay with you for 24 hours following the procedure.  For the next 24 hours, DO NOT: -Drive a car -Operate machinery -Drink alcoholic beverages -Take any medication unless instructed by your physician -Make any legal decisions or sign important papers.  Meals: Start with liquid foods such as gelatin or soup. Progress to regular foods as tolerated. Avoid greasy, spicy, heavy foods. If nausea and/or vomiting occur, drink only clear liquids until the nausea and/or vomiting subsides. Call your physician if vomiting continues.  Special Instructions/Symptoms: Your throat may feel dry or sore from the anesthesia or the breathing tube placed in your throat during surgery. If this causes discomfort, gargle with warm salt water. The discomfort should disappear within 24 hours.  If you had a scopolamine patch placed behind your ear for the management of post- operative nausea and/or vomiting:  1. The medication in the patch is effective for 72 hours, after which it should be removed.  Wrap patch in a tissue and discard in the trash. Wash hands thoroughly with soap and water. 2. You may remove the patch earlier than 72 hours if you experience unpleasant side effects which may include dry mouth, dizziness or visual disturbances. 3. Avoid touching the patch. Wash your hands with soap and water after contact with the patch.      Regional Anesthesia Blocks  1. Numbness or the inability to move the "blocked" extremity may last from 3-48 hours after placement. The length of time depends on the medication injected and your individual response to the medication. If the numbness is not going away after 48 hours, call your surgeon.  2. The extremity that is blocked will need to be protected until the numbness is gone and the  Strength has returned. Because you cannot feel it, you  will need to take extra care to avoid injury. Because it may be weak, you may have difficulty moving it or using it. You may not know what position it is in without looking at it while the block is in effect.  3. For blocks in the legs and feet, returning to weight bearing and walking needs to be done carefully. You will need to wait until the numbness is entirely gone and the strength has returned. You should be able to move your leg and foot normally before you try and bear weight or walk. You will need someone to be with you when you first try to ensure you do not fall and possibly risk injury.  4. Bruising and tenderness at the needle site are common side effects and will resolve in a few days.  5. Persistent numbness or new problems with movement should be communicated to the surgeon or the [facility] ([phone])/ [facility] ([phone]).    Hand Center Instructions Hand Surgery  Wound Care: Keep your hand elevated above the level of your heart.  Do not allow it to dangle by your side.  Keep the dressing dry and do not remove it unless your doctor advises you to do so.  He will usually change it at the time of your post-op visit.  Moving your fingers is advised to stimulate circulation but will depend on the site of your surgery.  If you have a splint applied, your doctor will advise you regarding movement.  Activity: Do not drive or operate machinery today.  Rest today and   then you may return to your normal activity and work as indicated by your physician.  Diet:  Drink liquids today or eat a light diet.  You may resume a regular diet tomorrow.    General expectations: Pain for two to three days. Fingers may become slightly swollen.  Call your doctor if any of the following occur: Severe pain not relieved by pain medication. Elevated temperature. Dressing soaked with blood. Inability to move fingers. White or bluish color to fingers.  

## 2017-06-20 NOTE — Op Note (Signed)
06/20/2017 [facility]  Operative Note  Pre Op Diagnosis: Right comminuted intraarticular distal radius fracture; left radial head fracture  Post Op Diagnosis: Right comminuted intraarticular distal radius fracture; left radial head fracture  Procedure:  1. ORIF Right comminuted intraarticular distal radius fracture, 3 intraarticular fragments 2. Closed treatment of left radial head fracture without manipulation  Surgeon: Leanora Cover, MD  Assistant: None  Anesthesia: Regional and sedation  Fluids: Per anesthesia flow sheet  EBL: minimal  Complications: None  Specimen: None  Tourniquet Time:  Total Tourniquet Time Documented: Upper Arm (Right) - 56 minutes Total: Upper Arm (Right) - 56 minutes   Disposition: Stable to PACU  INDICATIONS:  [name] is a 56 y.o. female states she fell 5 days ago injuring right wrist and left elbow.  Seen at ED where XR revealed right distal radius fracture and left radial head fracture.  Splinted and followed up in office.  We discussed nonoperative and operative treatment options.  She wished to proceed with operative fixation of the right distal radius and non operative treatment of the left radial head fracture.  Risks, benefits, and alternatives of surgery were discussed including the risk of blood loss; infection; damage to nerves, vessels, tendons, ligaments, bone; failure of surgery; need for additional surgery; complications with wound healing; continued pain; nonunion; malunion; stiffness.  We also discussed the possible need for bone graft and the benefits and risks including the possibility of disease transmission.  She voiced understanding of these risks and elected to proceed.   OPERATIVE COURSE:  After being identified preoperatively by myself, the patient and I agreed upon the procedure and site of procedure.  Surgical site was marked.  The risks, benefits and alternatives of the surgery were reviewed and she  wished to proceed.  Surgical consent had been signed.  She was given IV Ancef as preoperative antibiotic prophylaxis.  She was transferred to the operating room and placed on the operating room table in supine position with the Right upper extremity on an armboard. Regional and sedation anesthesia was induced by the anesthesiologist.  The Right upper extremity was prepped and draped in normal sterile orthopedic fashion.  A surgical pause was performed between the surgeons, anesthesia and operating room staff, and all were in agreement as to the patient, procedure and site of procedure.  Tourniquet at the proximal aspect of the extremity was inflated to 250 mmHg after exsanguination of the limb with an Esmarch bandage.  Standard volar Mallie Mussel approach was used.  The bipolar electrocautery was used to obtain hemostasis.  The superficial and deep portions of the FCR tendon sheath were incised, and the FCR and FPL were swept ulnarly to protect the palmar cutaneous branch of the median nerve.  The brachioradialis was released at the radial side of the radius.  The pronator quadratus was released and elevated with the periosteal elevator.  The fracture site was identified and cleared of soft tissue interposition and hematoma.  It was reduced under direct visualization.  There was comminution of the fracture with intraarticular extension creating three intraarticular fragments.   An AcuMed volar distal radial locking plate was selected.  It was secured to the bone with the guidepins.  C-arm was used in AP and lateral projections to ensure appropriate reduction and position of the hardware and adjustments made as necessary.  Standard AO drilling and measuring technique was used.  A single screw was placed in the slotted hole in the shaft of the plate.  The distal holes were filled with locking pegs with the exception of the styloid holes, which were filled with locking screws.  The remaining holes in the shaft of the plate  were filled with nonlocking screws.  Good purchase was obtained.  C-arm was used in AP, lateral and oblique projections to ensure appropriate reduction and position of hardware, which was the case.  There was no intra-articular penetration of hardware.  The wound was copiously irrigated with sterile saline.  Pronator quadratus was repaired back over top of the plate using 4-0 Vicryl suture.  Vicryl suture was placed in the subcutaneous tissues in an inverted interrupted fashion and the skin was closed with 4-0 nylon in a horizontal mattress fashion.  There was good pronation and supination of the wrist without crepitance.  The wound was then dressed with sterile Xeroform, 4x4s, and wrapped with a Kerlix bandage.  A volar splint was placed and wrapped with Kerlix and Ace bandage.  Tourniquet was deflated at 56 minutes.  Fingertips were pink with brisk capillary refill after deflation of the tourniquet.  Operative drapes were broken down.  The left radial head fracture did not require manipulation and is maintained in a posterior splint at this time.  The patient was awoken from anesthesia safely.  She was transferred back to the stretcher and taken to the PACU in stable condition.  I will see her back in the office in one week for postoperative followup.  I will give her a prescription for ultram 50 mg 1-2 tabs PO q6 hours prn pain, dispense #30.    Tennis Must, MD Electronically signed, 06/20/17

## 2017-06-20 NOTE — Anesthesia Preprocedure Evaluation (Signed)
Anesthesia Evaluation  Patient identified by MRN, date of birth, ID band Patient awake    Reviewed: Allergy & Precautions, NPO status , Patient's Chart, lab work & pertinent test results  Airway Mallampati: II  TM Distance: >3 FB Neck ROM: Full    Dental no notable dental hx.    Pulmonary neg pulmonary ROS, former smoker,    Pulmonary exam normal breath sounds clear to auscultation       Cardiovascular negative cardio ROS Normal cardiovascular exam Rhythm:Regular Rate:Normal     Neuro/Psych Anxiety Depression negative neurological ROS  negative psych ROS   GI/Hepatic negative GI ROS, Neg liver ROS, GERD  ,  Endo/Other  negative endocrine ROS  Renal/GU negative Renal ROS  negative genitourinary   Musculoskeletal negative musculoskeletal ROS (+)   Abdominal   Peds negative pediatric ROS (+)  Hematology negative hematology ROS (+)   Anesthesia Other Findings   Reproductive/Obstetrics negative OB ROS                             Anesthesia Physical Anesthesia Plan  ASA: II  Anesthesia Plan: MAC and Regional   Post-op Pain Management:  Regional for Post-op pain   Induction: Intravenous  PONV Risk Score and Plan: 2 and Ondansetron and Dexamethasone  Airway Management Planned:   Additional Equipment:   Intra-op Plan:   Post-operative Plan:   Informed Consent: I have reviewed the patients History and Physical, chart, labs and discussed the procedure including the risks, benefits and alternatives for the proposed anesthesia with the patient or authorized representative who has indicated his/her understanding and acceptance.   Dental advisory given  Plan Discussed with: CRNA  Anesthesia Plan Comments:         Anesthesia Quick Evaluation

## 2017-06-20 NOTE — Progress Notes (Signed)
Assisted Dr. Miller with right, ultrasound guided, supraclavicular block. Side rails up, monitors on throughout procedure. See vital signs in flow sheet. Tolerated Procedure well. 

## 2017-06-20 NOTE — Brief Op Note (Signed)
06/20/2017  2:53 PM  PATIENT:  Cristino Martes  56 y.o. female  PRE-OPERATIVE DIAGNOSIS:  RIGHT DISTAL RADIUS FRACTURE  POST-OPERATIVE DIAGNOSIS:  RIGHT DISTAL RADIUS FRACTURE  PROCEDURE:  Procedure(s): OPEN REDUCTION INTERNAL FIXATION (ORIF) DISTAL RADIAL FRACTURE (Right)  SURGEON:  Surgeon(s) and Role:    Leanora Cover, MD - Primary  PHYSICIAN ASSISTANT:   ASSISTANTS: none   ANESTHESIA:   regional and IV sedation  EBL:  Total I/O In: 800 [I.V.:800] Out: -   BLOOD ADMINISTERED:none  DRAINS: none   LOCAL MEDICATIONS USED:  NONE  SPECIMEN:  No Specimen  DISPOSITION OF SPECIMEN:  N/A  COUNTS:  YES  TOURNIQUET:   Total Tourniquet Time Documented: Upper Arm (Right) - 56 minutes Total: Upper Arm (Right) - 56 minutes   DICTATION: .Note written in EPIC  PLAN OF CARE: Discharge to home after PACU  PATIENT DISPOSITION:  PACU - hemodynamically stable.

## 2017-06-21 ENCOUNTER — Encounter (HOSPITAL_BASED_OUTPATIENT_CLINIC_OR_DEPARTMENT_OTHER): Payer: Self-pay | Admitting: Orthopedic Surgery

## 2017-06-27 DIAGNOSIS — M25531 Pain in right wrist: Secondary | ICD-10-CM | POA: Diagnosis not present

## 2017-06-27 DIAGNOSIS — S52571D Other intraarticular fracture of lower end of right radius, subsequent encounter for closed fracture with routine healing: Secondary | ICD-10-CM | POA: Diagnosis not present

## 2017-06-27 DIAGNOSIS — M25639 Stiffness of unspecified wrist, not elsewhere classified: Secondary | ICD-10-CM | POA: Diagnosis not present

## 2017-06-27 DIAGNOSIS — S52125D Nondisplaced fracture of head of left radius, subsequent encounter for closed fracture with routine healing: Secondary | ICD-10-CM | POA: Diagnosis not present

## 2017-06-29 DIAGNOSIS — Z01419 Encounter for gynecological examination (general) (routine) without abnormal findings: Secondary | ICD-10-CM | POA: Diagnosis not present

## 2017-06-29 DIAGNOSIS — Z1231 Encounter for screening mammogram for malignant neoplasm of breast: Secondary | ICD-10-CM | POA: Diagnosis not present

## 2017-07-05 DIAGNOSIS — J322 Chronic ethmoidal sinusitis: Secondary | ICD-10-CM | POA: Diagnosis not present

## 2017-07-05 DIAGNOSIS — J310 Chronic rhinitis: Secondary | ICD-10-CM | POA: Diagnosis not present

## 2017-07-05 DIAGNOSIS — G4733 Obstructive sleep apnea (adult) (pediatric): Secondary | ICD-10-CM | POA: Diagnosis not present

## 2017-07-13 ENCOUNTER — Other Ambulatory Visit: Payer: Self-pay | Admitting: Family Medicine

## 2017-07-13 DIAGNOSIS — F430 Acute stress reaction: Secondary | ICD-10-CM

## 2017-07-18 DIAGNOSIS — S52571D Other intraarticular fracture of lower end of right radius, subsequent encounter for closed fracture with routine healing: Secondary | ICD-10-CM | POA: Diagnosis not present

## 2017-07-18 DIAGNOSIS — S52125D Nondisplaced fracture of head of left radius, subsequent encounter for closed fracture with routine healing: Secondary | ICD-10-CM | POA: Diagnosis not present

## 2017-08-01 DIAGNOSIS — S52125D Nondisplaced fracture of head of left radius, subsequent encounter for closed fracture with routine healing: Secondary | ICD-10-CM | POA: Diagnosis not present

## 2017-08-01 DIAGNOSIS — S52571D Other intraarticular fracture of lower end of right radius, subsequent encounter for closed fracture with routine healing: Secondary | ICD-10-CM | POA: Diagnosis not present

## 2017-08-07 DIAGNOSIS — M25639 Stiffness of unspecified wrist, not elsewhere classified: Secondary | ICD-10-CM | POA: Diagnosis not present

## 2017-08-07 DIAGNOSIS — S52125D Nondisplaced fracture of head of left radius, subsequent encounter for closed fracture with routine healing: Secondary | ICD-10-CM | POA: Diagnosis not present

## 2017-08-07 DIAGNOSIS — M25622 Stiffness of left elbow, not elsewhere classified: Secondary | ICD-10-CM | POA: Diagnosis not present

## 2017-08-11 ENCOUNTER — Telehealth: Payer: Self-pay

## 2017-08-11 NOTE — Telephone Encounter (Signed)
Called patient to schedule 2nd shingrix vaccine.  No answer.  Left a message for call back.    Schedulers--when patient calls back, please schedule nurse visit for 2nd shingrix before October 3rd. Thanks.

## 2017-08-11 NOTE — Telephone Encounter (Signed)
Patient scheduled for Fri 08/18/2017 with nurse only

## 2017-08-14 ENCOUNTER — Other Ambulatory Visit: Payer: Self-pay | Admitting: Medical

## 2017-08-15 DIAGNOSIS — M25622 Stiffness of left elbow, not elsewhere classified: Secondary | ICD-10-CM | POA: Diagnosis not present

## 2017-08-15 DIAGNOSIS — M25639 Stiffness of unspecified wrist, not elsewhere classified: Secondary | ICD-10-CM | POA: Diagnosis not present

## 2017-08-18 ENCOUNTER — Ambulatory Visit: Payer: 59

## 2017-08-22 DIAGNOSIS — M25639 Stiffness of unspecified wrist, not elsewhere classified: Secondary | ICD-10-CM | POA: Diagnosis not present

## 2017-08-22 DIAGNOSIS — M25622 Stiffness of left elbow, not elsewhere classified: Secondary | ICD-10-CM | POA: Diagnosis not present

## 2017-08-25 ENCOUNTER — Ambulatory Visit: Payer: 59

## 2017-08-29 ENCOUNTER — Telehealth: Payer: Self-pay | Admitting: Family Medicine

## 2017-08-29 NOTE — Telephone Encounter (Signed)
Relation to FT:DDUK Call back number:[phone]    Reason for call:  Patient sent my chart message requesting shingle orders, flu and mamo orders, patient scheduled with PCP for 09/07/17. ______________________________________________________________________________________________    From: [name]   Sent: 08/29/2017 11:40 AM EDT    To: Patient HM Schedule Request Mailing List Subject: Appointment Request (HM)  Appointment Request From: [name]  With Provider: Ann Held, [place] [facility] Relation to pt: self  Call back number: Pharmacy:  Reason for call:  Patient sent mychart message requesting shingle, flu and mamo orders, please advise  Preferred Date Range: From 08/29/2017 To 09/11/2017  Preferred Times: Any  Reason: To address the following health maintenance concerns. Mammogram Influenza Vaccine  Comments: I need my second shingles shot  Thank you,  [name] [phone]

## 2017-08-30 DIAGNOSIS — M25531 Pain in right wrist: Secondary | ICD-10-CM | POA: Diagnosis not present

## 2017-08-30 DIAGNOSIS — M25522 Pain in left elbow: Secondary | ICD-10-CM | POA: Diagnosis not present

## 2017-08-31 NOTE — Telephone Encounter (Signed)
Spoke with pt and she states she was trying to schedule a nurse visit through [facility] for her 2nd shingles but it scheduled an ov with the doctor. Also pt states she had her mammogram and would like this updated in her chart. She will send [place] a Mychart message of the exact date to update her chart. Schedulers will reach out to pt and fix her appts. She had no additional questions at this time. Nothing further is needed

## 2017-09-05 DIAGNOSIS — M25531 Pain in right wrist: Secondary | ICD-10-CM | POA: Diagnosis not present

## 2017-09-05 DIAGNOSIS — M25639 Stiffness of unspecified wrist, not elsewhere classified: Secondary | ICD-10-CM | POA: Diagnosis not present

## 2017-09-07 ENCOUNTER — Ambulatory Visit: Payer: 59 | Admitting: Family Medicine

## 2017-09-12 DIAGNOSIS — S52571D Other intraarticular fracture of lower end of right radius, subsequent encounter for closed fracture with routine healing: Secondary | ICD-10-CM | POA: Diagnosis not present

## 2017-09-12 DIAGNOSIS — S52125D Nondisplaced fracture of head of left radius, subsequent encounter for closed fracture with routine healing: Secondary | ICD-10-CM | POA: Diagnosis not present

## 2017-09-13 ENCOUNTER — Ambulatory Visit (INDEPENDENT_AMBULATORY_CARE_PROVIDER_SITE_OTHER): Payer: 59 | Admitting: Behavioral Health

## 2017-09-13 DIAGNOSIS — Z23 Encounter for immunization: Secondary | ICD-10-CM

## 2017-09-13 DIAGNOSIS — J343 Hypertrophy of nasal turbinates: Secondary | ICD-10-CM | POA: Diagnosis not present

## 2017-09-13 DIAGNOSIS — J342 Deviated nasal septum: Secondary | ICD-10-CM | POA: Diagnosis not present

## 2017-09-13 DIAGNOSIS — J310 Chronic rhinitis: Secondary | ICD-10-CM | POA: Diagnosis not present

## 2017-09-13 NOTE — Progress Notes (Signed)
Pre visit review using our clinic review tool, if applicable. No additional management support is needed unless otherwise documented below in the visit note.  Patient came in office for Shingrix & Influenza vaccinations. She tolerated both injections well. No signs or symptoms of a reaction before leaving the nurse visit.

## 2017-09-14 ENCOUNTER — Telehealth: Payer: Self-pay | Admitting: Family Medicine

## 2017-09-14 NOTE — Telephone Encounter (Signed)
Pt called in to be advised. She said that she had the shingles and flu shot yesterday and is now having what she thinks is a side effect.    Pt says last night she had a headache and had some nausea. Today pt says that she is having chills and some muscle cramps.    She said that she left work early because she's not sure whats going on.    Please advise pt further.

## 2017-09-15 NOTE — Telephone Encounter (Signed)
It is possible with either --- should go away in 3-4 days

## 2017-09-15 NOTE — Telephone Encounter (Signed)
YL-Plz see message/possible SE? thx dmf

## 2017-09-20 NOTE — Telephone Encounter (Signed)
Spoke with pt and she states symptoms did clear up and she is feeling ok now.

## 2017-10-13 ENCOUNTER — Ambulatory Visit (INDEPENDENT_AMBULATORY_CARE_PROVIDER_SITE_OTHER): Payer: 59 | Admitting: Family Medicine

## 2017-10-13 ENCOUNTER — Encounter: Payer: Self-pay | Admitting: Family Medicine

## 2017-10-13 VITALS — BP 98/52 | HR 67 | Temp 98.1°F | Resp 16 | Ht 71.0 in | Wt 180.2 lb

## 2017-10-13 DIAGNOSIS — K219 Gastro-esophageal reflux disease without esophagitis: Secondary | ICD-10-CM

## 2017-10-13 DIAGNOSIS — B029 Zoster without complications: Secondary | ICD-10-CM

## 2017-10-13 MED ORDER — TRIAMCINOLONE ACETONIDE 0.025 % EX CREA: 1.0000 "application " | TOPICAL_CREAM | Freq: Two times a day (BID) | CUTANEOUS | 0 refills | Status: DC

## 2017-10-13 MED ORDER — DEXLANSOPRAZOLE 30 MG PO CPDR: 30.0000 mg | DELAYED_RELEASE_CAPSULE | Freq: Every day | ORAL | 5 refills | Status: DC

## 2017-10-13 MED ORDER — VALACYCLOVIR HCL 1 G PO TABS: 1000.0000 mg | ORAL_TABLET | Freq: Three times a day (TID) | ORAL | 0 refills | Status: DC

## 2017-10-13 NOTE — Progress Notes (Signed)
Patient ID: [name], female   DOB: August 23, 1961, 56 y.o.   MRN: [id number]    Subjective:  I acted as a Education administrator for Dr. Carollee Herter.  Guerry Bruin, Brandt   Patient ID: [name], female    DOB: 07-03-1961, 56 y.o.   MRN: [id number]  Chief Complaint  Patient presents with  . rash on bottom    HPI  Patient is in today for rash on left butt cheek and she has 3 dots on left side of nose.  Noticed about 3 days ago.  It burns, itches, and sore.   Dots on left side of nose has been on her face about 3 months.  She is scheduled to see dermatology on 10/24/17.  Patient Care Team: Carollee Herter, Alferd Apa, DO as PCP - General (Family Medicine) Pyrtle, Lajuan Lines, MD as Consulting Physician (Gastroenterology) Mosetta Anis, MD as Referring Physician (Allergy)   Past Medical History:  Diagnosis Date  . Allergic rhinitis   . Anxiety   . Back pain   . Contact lens/glasses fitting   . Depression   . Endometriosis   . Fatigue   . GERD (gastroesophageal reflux disease)   . Hiatal hernia   . History of cardiac catheterization 12/1990  . History of Ostium Secundum Atrial Septal Defect 1989   Status post repair in 1989/1990  . Hx of migraines   . IBS (irritable bowel syndrome)   . Incontinence   . Intestinal volvulus [facility]) May 2007  . Loss of appetite   . Onychomycosis   . Varicose veins     Past Surgical History:  Procedure Laterality Date  . ABDOMINAL HYSTERECTOMY  2004  . ASD REPAIR, SECUNDUM  1990   Dr. Servando Snare  . BALLOON SINUPLASTY Left   . EYE SURGERY     muscle reattachment 02/2017  . hysterectomy for severe endometriosis    . NASAL SEPTUM SURGERY     Dr. Georgia Lopes  . OPEN REDUCTION INTERNAL FIXATION (ORIF) DISTAL RADIAL FRACTURE Right 06/20/2017   Procedure: OPEN REDUCTION INTERNAL FIXATION (ORIF) DISTAL RADIAL FRACTURE;  Surgeon: Leanora Cover, MD;  Location: [place];  Service: Orthopedics;  Laterality: Right;  . right ankle surgery  2010  . right leg  fracture with surgery     done with ankle surgery  . TRANSTHORACIC ECHOCARDIOGRAM   January 2012   Normal LV size and function, EF greater than 55%. Mild LA dilation. No intra-atrial shunt with bubble study. Normal pulmonary pressures. Only trace to mild mitral and tricuspid regurgitation.  . varicose laser vein surgery  2009    Family History  Problem Relation Age of Onset  . Diabetes Mother   . Colon cancer Paternal Grandfather   . Heart disease Maternal Grandfather   . Prostate cancer Maternal Grandfather   . Lung cancer Maternal Grandmother        with mets to the brain  . Clotting disorder Maternal Grandmother   . Clotting disorder Sister   . Heart disease Sister     Social History   Social History  . Marital status: Legally Separated    Spouse name: Richard  . Number of children: N/A  . Years of education: N/A   Occupational History  . SALES [employer]   Social History Main Topics  . Smoking status: Former Smoker    Packs/day: 1.00    Years: 10.00    Types: Cigarettes    Quit date: 12/12/1988  . Smokeless tobacco: Never Used  .  Alcohol use Yes     Comment: social use/wine  . Drug use: No  . Sexual activity: Not on file   Other Topics Concern  . Not on file   Social History Narrative   They have 2 children and at least two grandchildren. She does all her activities of daily living. She works as a Midwife for [employer].     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 She does other days of the gym and she is able to at least 60 minutes a day 5 days a week.   She is a former smoker who quit in 1990. This was after smoking a pack a day for 10 years. She drinks social alcohol on occasion.    Outpatient Medications Prior to Visit  Medication Sig Dispense Refill  . Calcium Carb-Cholecalciferol (CALTRATE 600+D) 600-800 MG-UNIT TABS Take 1 tablet by  mouth 2 (two) times daily.    . cycloSPORINE (RESTASIS) 0.05 % ophthalmic emulsion 1 drop 2 (two) times daily.    [name] EPIPEN 2-PAK 0.3 MG/0.3ML SOAJ injection     . FLUoxetine (PROZAC) 10 MG tablet Take 1 tablet (10 mg total) by mouth daily. 30 tablet 5  . FLUoxetine (PROZAC) 20 MG tablet take 1 tablet by mouth once daily 90 tablet 3  . fluticasone (FLONASE) 50 MCG/ACT nasal spray instill 2 sprays into each nostril once daily 16 g 1  . levocetirizine (XYZAL) 5 MG tablet     . magnesium oxide (MAG-OX) 400 MG tablet Take 400 mg by mouth daily.    . montelukast (SINGULAIR) 10 MG tablet     . Multiple Vitamin (MULTIVITAMINS PO) Take 1 tablet by mouth daily.      . traMADol (ULTRAM) 50 MG tablet Take 1 tablet (50 mg total) by mouth every 6 (six) hours as needed. 15 tablet 0  . traMADol (ULTRAM) 50 MG tablet Take 1 tablet (50 mg total) by mouth every 6 (six) hours as needed. 20 tablet 0  . Dexlansoprazole (DEXILANT) 30 MG capsule Take 1 capsule (30 mg total) by mouth daily. 30 capsule 5   No facility-administered medications prior to visit.     Allergies  Allergen Reactions  . Chlordiazepoxide-Clidinium     "loopy"  . Codeine     REACTION: nausea,vomitting and dizziness  . Penicillins     REACTION: hives ---taken as a child    Review of Systems  Skin: Positive for rash.       Rash on left butt cheek 3 dots on left side of nose        Objective:    Physical Exam  Skin: Rash noted. Rash is vesicular.     Nursing note and vitals reviewed.   BP (!) 98/52 (BP Location: Left Arm, Cuff Size: Normal)   Pulse 67   Temp 98.1 F (36.7 C) (Oral)   Resp 16   Ht 5\' 11"  (1.803 m)   Wt 180 lb 3.2 oz (81.7 kg)   SpO2 97%   BMI 25.13 kg/m  Wt Readings from Last 3 Encounters:  10/13/17 180 lb 3.2 oz (81.7 kg)  06/20/17 173 lb (78.5 kg)  06/15/17 173 lb (78.5 kg)   BP Readings from Last 3 Encounters:  10/13/17 (!) 98/52  06/20/17 140/83  06/15/17 104/65     Immunization History    Administered Date(s) Administered  . Influenza Split  10/25/2011, 10/25/2012  . Influenza Whole 09/17/2008, 10/08/2009, 11/11/2010  . Influenza,inj,Quad PF,6+ Mos 11/18/2013, 09/13/2017  . Influenza-Unspecified 09/11/2014, 11/01/2015, 11/01/2015  . Tdap 01/16/2012  . Zoster Recombinat (Shingrix) 03/17/2017, 09/13/2017    Health Maintenance  Topic Date Due  . MAMMOGRAM  02/10/2017  . PAP SMEAR  02/10/2018  . COLONOSCOPY  02/17/2021  . TETANUS/TDAP  01/15/2022  . INFLUENZA VACCINE  Completed  . Hepatitis C Screening  Completed  . HIV Screening  Completed    Lab Results  Component Value Date   WBC 7.4 03/17/2017   HGB 14.0 03/17/2017   HCT 41.9 03/17/2017   PLT 329.0 03/17/2017   GLUCOSE 82 03/17/2017   CHOL 150 03/17/2017   TRIG 99.0 03/17/2017   HDL 43.90 03/17/2017   LDLCALC 86 03/17/2017   ALT 22 03/17/2017   AST 23 03/17/2017   NA 143 03/17/2017   K 3.9 03/17/2017   CL 106 03/17/2017   CREATININE 0.76 03/17/2017   BUN 17 03/17/2017   CO2 32 03/17/2017   TSH 2.86 03/17/2017    Lab Results  Component Value Date   TSH 2.86 03/17/2017   Lab Results  Component Value Date   WBC 7.4 03/17/2017   HGB 14.0 03/17/2017   HCT 41.9 03/17/2017   MCV 92.1 03/17/2017   PLT 329.0 03/17/2017   Lab Results  Component Value Date   NA 143 03/17/2017   K 3.9 03/17/2017   CO2 32 03/17/2017   GLUCOSE 82 03/17/2017   BUN 17 03/17/2017   CREATININE 0.76 03/17/2017   BILITOT 0.8 03/17/2017   ALKPHOS 45 03/17/2017   AST 23 03/17/2017   ALT 22 03/17/2017   PROT 6.6 03/17/2017   ALBUMIN 4.2 03/17/2017   CALCIUM 9.1 03/17/2017   GFR 83.26 03/17/2017   Lab Results  Component Value Date   CHOL 150 03/17/2017   Lab Results  Component Value Date   HDL 43.90 03/17/2017   Lab Results  Component Value Date   LDLCALC 86 03/17/2017   Lab Results  Component Value Date   TRIG 99.0 03/17/2017   Lab Results  Component Value Date   CHOLHDL 3 03/17/2017   No results  found for: HGBA1C       Assessment & Plan:   Problem List Items Addressed This Visit      Unprioritized   GERD (gastroesophageal reflux disease)-probable paroxsysmal relaxation LES   Relevant Medications   Dexlansoprazole (DEXILANT) 30 MG capsule    Other Visit Diagnoses    Herpes zoster without complication    -  Primary   Relevant Medications   valACYclovir (VALTREX) 1000 MG tablet   triamcinolone (KENALOG) 0.025 % cream      I am having [name] start on valACYclovir and triamcinolone. I am also having her maintain her Multiple Vitamin (MULTIVITAMINS PO), Calcium Carb-Cholecalciferol, magnesium oxide, levocetirizine, montelukast, EPIPEN 2-PAK, cycloSPORINE, FLUoxetine, traMADol, traMADol, FLUoxetine, fluticasone, and Dexlansoprazole.  Meds ordered this encounter  Medications  . valACYclovir (VALTREX) 1000 MG tablet    Sig: Take 1 tablet (1,000 mg total) by mouth 3 (three) times daily.    Dispense:  30 tablet    Refill:  0  . triamcinolone (KENALOG) 0.025 % cream    Sig: Apply 1 application topically 2 (two) times daily.    Dispense:  30 g    Refill:  0  . Dexlansoprazole (DEXILANT) 30 MG capsule    Sig: Take 1 capsule (30 mg total) by mouth daily.    Dispense:  30 capsule    Refill:  5    CMA served as scribe during this visit. History, Physical and Plan performed by medical provider. Documentation and orders reviewed and attested to.  Ann Held, DO

## 2017-10-13 NOTE — Patient Instructions (Signed)
Shingles Shingles, which is also known as herpes zoster, is an infection that causes a painful skin rash and fluid-filled blisters. Shingles is not related to genital herpes, which is a sexually transmitted infection. Shingles only develops in people who:  Have had chickenpox.  Have received the chickenpox vaccine. (This is rare.)  What are the causes? Shingles is caused by varicella-zoster virus (VZV). This is the same virus that causes chickenpox. After exposure to VZV, the virus stays in the body in an inactive (dormant) state. Shingles develops if the virus reactivates. This can happen many years after the initial exposure to VZV. It is not known what causes this virus to reactivate. What increases the risk? People who have had chickenpox or received the chickenpox vaccine are at risk for shingles. Infection is more common in people who:  Are older than age 50.  Have a weakened defense (immune) system, such as those with HIV, AIDS, or cancer.  Are taking medicines that weaken the immune system, such as transplant medicines.  Are under great stress.  What are the signs or symptoms? Early symptoms of this condition include itching, tingling, and pain in an area on your skin. Pain may be described as burning, stabbing, or throbbing. A few days or weeks after symptoms start, a painful red rash appears, usually on one side of the body in a bandlike or beltlike pattern. The rash eventually turns into fluid-filled blisters that break open, scab over, and dry up in about 2-3 weeks. At any time during the infection, you may also develop:  A fever.  Chills.  A headache.  An upset stomach.  How is this diagnosed? This condition is diagnosed with a skin exam. Sometimes, skin or fluid samples are taken from the blisters before a diagnosis is made. These samples are examined under a microscope or sent to a lab for testing. How is this treated? There is no specific cure for this condition.  Your health care provider will probably prescribe medicines to help you manage pain, recover more quickly, and avoid long-term problems. Medicines may include:  Antiviral drugs.  Anti-inflammatory drugs.  Pain medicines.  If the area involved is on your face, you may be referred to a specialist, such as an eye doctor (ophthalmologist) or an ear, nose, and throat (ENT) doctor to help you avoid eye problems, chronic pain, or disability. Follow these instructions at home: Medicines  Take medicines only as directed by your health care provider.  Apply an anti-itch or numbing cream to the affected area as directed by your health care provider. Blister and Rash Care  Take a cool bath or apply cool compresses to the area of the rash or blisters as directed by your health care provider. This may help with pain and itching.  Keep your rash covered with a loose bandage (dressing). Wear loose-fitting clothing to help ease the pain of material rubbing against the rash.  Keep your rash and blisters clean with mild soap and cool water or as directed by your health care provider.  Check your rash every day for signs of infection. These include redness, swelling, and pain that lasts or increases.  Do not pick your blisters.  Do not scratch your rash. General instructions  Rest as directed by your health care provider.  Keep all follow-up visits as directed by your health care provider. This is important.  Until your blisters scab over, your infection can cause chickenpox in people who have never had it or been vaccinated   against it. To prevent this from happening, avoid contact with other people, especially: ? Babies. ? Pregnant women. ? Children who have eczema. ? Elderly people who have transplants. ? People who have chronic illnesses, such as leukemia or AIDS. Contact a health care provider if:  Your pain is not relieved with prescribed medicines.  Your pain does not get better after  the rash heals.  Your rash looks infected. Signs of infection include redness, swelling, and pain that lasts or increases. Get help right away if:  The rash is on your face or nose.  You have facial pain, pain around your eye area, or loss of feeling on one side of your face.  You have ear pain or you have ringing in your ear.  You have loss of taste.  Your condition gets worse. This information is not intended to replace advice given to you by your health care provider. Make sure you discuss any questions you have with your health care provider. Document Released: 11/28/2005 Document Revised: 07/24/2016 Document Reviewed: 10/09/2014 Elsevier Interactive Patient Education  2017 [employer]  

## 2017-10-24 DIAGNOSIS — L814 Other melanin hyperpigmentation: Secondary | ICD-10-CM | POA: Diagnosis not present

## 2017-10-24 DIAGNOSIS — L821 Other seborrheic keratosis: Secondary | ICD-10-CM | POA: Diagnosis not present

## 2017-10-24 DIAGNOSIS — D225 Melanocytic nevi of trunk: Secondary | ICD-10-CM | POA: Diagnosis not present

## 2017-11-07 DIAGNOSIS — H1089 Other conjunctivitis: Secondary | ICD-10-CM | POA: Diagnosis not present

## 2017-11-07 DIAGNOSIS — J0101 Acute recurrent maxillary sinusitis: Secondary | ICD-10-CM | POA: Diagnosis not present

## 2017-11-07 DIAGNOSIS — J0111 Acute recurrent frontal sinusitis: Secondary | ICD-10-CM | POA: Diagnosis not present

## 2017-11-07 DIAGNOSIS — J343 Hypertrophy of nasal turbinates: Secondary | ICD-10-CM | POA: Diagnosis not present

## 2017-11-09 ENCOUNTER — Other Ambulatory Visit (INDEPENDENT_AMBULATORY_CARE_PROVIDER_SITE_OTHER): Payer: Self-pay | Admitting: Otolaryngology

## 2017-11-09 DIAGNOSIS — J329 Chronic sinusitis, unspecified: Secondary | ICD-10-CM

## 2017-11-10 ENCOUNTER — Ambulatory Visit
Admission: RE | Admit: 2017-11-10 | Discharge: 2017-11-10 | Disposition: A | Discharge status: Home / Self Care | Payer: 59 | Source: Ambulatory Visit | Attending: Otolaryngology | Admitting: Otolaryngology

## 2017-11-10 DIAGNOSIS — J0130 Acute sphenoidal sinusitis, unspecified: Secondary | ICD-10-CM | POA: Diagnosis not present

## 2017-11-10 DIAGNOSIS — J329 Chronic sinusitis, unspecified: Secondary | ICD-10-CM

## 2017-12-06 DIAGNOSIS — J3089 Other allergic rhinitis: Secondary | ICD-10-CM | POA: Diagnosis not present

## 2017-12-06 DIAGNOSIS — H1045 Other chronic allergic conjunctivitis: Secondary | ICD-10-CM | POA: Diagnosis not present

## 2017-12-07 ENCOUNTER — Encounter: Payer: Self-pay | Admitting: Family Medicine

## 2017-12-07 ENCOUNTER — Ambulatory Visit: Payer: 59 | Admitting: Family Medicine

## 2017-12-07 VITALS — BP 102/68 | HR 74 | Temp 98.4°F | Ht 71.0 in | Wt 182.2 lb

## 2017-12-07 DIAGNOSIS — B029 Zoster without complications: Secondary | ICD-10-CM

## 2017-12-07 MED ORDER — GABAPENTIN 300 MG PO CAPS: 300.0000 mg | ORAL_CAPSULE | Freq: Three times a day (TID) | ORAL | 0 refills | Status: DC | PRN

## 2017-12-07 MED ORDER — VALACYCLOVIR HCL 1 G PO TABS: 1000.0000 mg | ORAL_TABLET | Freq: Three times a day (TID) | ORAL | 0 refills | Status: DC

## 2017-12-07 NOTE — Progress Notes (Signed)
Pre visit review using our clinic review tool, if applicable. No additional management support is needed unless otherwise documented below in the visit note. 

## 2017-12-07 NOTE — Patient Instructions (Signed)
Things to look out for: increasing pain not relieved by medicine, fevers, spreading redness, drainage of pus, or foul odor.  Let us know if you need anything.

## 2017-12-07 NOTE — Progress Notes (Signed)
Chief Complaint  Patient presents with  . Herpes Zoster    [name] is a 56 y.o. female here for a skin complaint.  Duration: 2 days Location: R hip Pruritic? Yes Painful? Yes Drainage? No Other associated symptoms: Burning Therapies tried thus far: Kenalog cream  ROS:  Const: No fevers Skin: As noted in HPI  Past Medical History:  Diagnosis Date  . Allergic rhinitis   . Anxiety   . Back pain   . Contact lens/glasses fitting   . Depression   . Endometriosis   . Fatigue   . GERD (gastroesophageal reflux disease)   . Hiatal hernia   . History of cardiac catheterization 12/1990  . History of Ostium Secundum Atrial Septal Defect 1989   Status post repair in 1989/1990  . Hx of migraines   . IBS (irritable bowel syndrome)   . Incontinence   . Intestinal volvulus [facility]) May 2007  . Loss of appetite   . Onychomycosis   . Varicose veins    BP 102/68 (BP Location: Left Arm, Patient Position: Sitting, Cuff Size: Normal)   Pulse 74   Temp 98.4 F (36.9 C) (Oral)   Ht 5\' 11"  (1.803 m)   Wt 182 lb 4 oz (82.7 kg)   SpO2 99%   BMI 25.42 kg/m  Gen: awake, alert, appearing stated age Lungs: No accessory muscle use  Skin: Erythematous patch with vesicles and a few pustules on R buttock, dimensions 1.7 cm x 1.8 cm. No drainage, fluctuance, excoriation Psych: Age appropriate judgment and insight  Herpes zoster without complication - Plan: gabapentin (NEURONTIN) 300 MG capsule, valACYclovir (VALTREX) 1000 MG tablet  Pt examined in presence of female chaperone.  If things don't improve, let us know.  Warning s/s's written and verbalized. F/u prn. The patient voiced understanding and agreement to the plan.  [place], DO 12/07/17 2:47 PM

## 2017-12-08 ENCOUNTER — Other Ambulatory Visit (INDEPENDENT_AMBULATORY_CARE_PROVIDER_SITE_OTHER): Payer: Self-pay | Admitting: Otolaryngology

## 2017-12-08 DIAGNOSIS — J342 Deviated nasal septum: Secondary | ICD-10-CM | POA: Diagnosis not present

## 2017-12-08 DIAGNOSIS — J320 Chronic maxillary sinusitis: Secondary | ICD-10-CM | POA: Diagnosis not present

## 2017-12-08 DIAGNOSIS — J338 Other polyp of sinus: Secondary | ICD-10-CM | POA: Diagnosis not present

## 2017-12-08 DIAGNOSIS — J323 Chronic sphenoidal sinusitis: Secondary | ICD-10-CM | POA: Diagnosis not present

## 2017-12-08 DIAGNOSIS — J324 Chronic pansinusitis: Secondary | ICD-10-CM | POA: Diagnosis not present

## 2017-12-08 DIAGNOSIS — J321 Chronic frontal sinusitis: Secondary | ICD-10-CM | POA: Diagnosis not present

## 2017-12-08 DIAGNOSIS — J343 Hypertrophy of nasal turbinates: Secondary | ICD-10-CM | POA: Diagnosis not present

## 2017-12-08 DIAGNOSIS — J329 Chronic sinusitis, unspecified: Secondary | ICD-10-CM | POA: Diagnosis not present

## 2017-12-08 DIAGNOSIS — J322 Chronic ethmoidal sinusitis: Secondary | ICD-10-CM | POA: Diagnosis not present

## 2017-12-11 ENCOUNTER — Ambulatory Visit (INDEPENDENT_AMBULATORY_CARE_PROVIDER_SITE_OTHER): Payer: 59 | Admitting: Otolaryngology

## 2017-12-11 DIAGNOSIS — J320 Chronic maxillary sinusitis: Secondary | ICD-10-CM | POA: Diagnosis not present

## 2017-12-11 DIAGNOSIS — J322 Chronic ethmoidal sinusitis: Secondary | ICD-10-CM | POA: Diagnosis not present

## 2017-12-11 DIAGNOSIS — J321 Chronic frontal sinusitis: Secondary | ICD-10-CM | POA: Diagnosis not present

## 2017-12-22 DIAGNOSIS — J3089 Other allergic rhinitis: Secondary | ICD-10-CM | POA: Diagnosis not present

## 2017-12-22 DIAGNOSIS — H1045 Other chronic allergic conjunctivitis: Secondary | ICD-10-CM | POA: Diagnosis not present

## 2017-12-26 DIAGNOSIS — J320 Chronic maxillary sinusitis: Secondary | ICD-10-CM | POA: Diagnosis not present

## 2017-12-26 DIAGNOSIS — J323 Chronic sphenoidal sinusitis: Secondary | ICD-10-CM | POA: Diagnosis not present

## 2017-12-26 DIAGNOSIS — J322 Chronic ethmoidal sinusitis: Secondary | ICD-10-CM | POA: Diagnosis not present

## 2018-01-01 DIAGNOSIS — J3089 Other allergic rhinitis: Secondary | ICD-10-CM | POA: Diagnosis not present

## 2018-01-16 ENCOUNTER — Ambulatory Visit: Payer: 59 | Admitting: Family Medicine

## 2018-01-16 ENCOUNTER — Encounter: Payer: Self-pay | Admitting: Family Medicine

## 2018-01-16 DIAGNOSIS — J320 Chronic maxillary sinusitis: Secondary | ICD-10-CM | POA: Diagnosis not present

## 2018-01-16 DIAGNOSIS — B029 Zoster without complications: Secondary | ICD-10-CM | POA: Diagnosis not present

## 2018-01-16 DIAGNOSIS — J322 Chronic ethmoidal sinusitis: Secondary | ICD-10-CM | POA: Diagnosis not present

## 2018-01-16 DIAGNOSIS — J323 Chronic sphenoidal sinusitis: Secondary | ICD-10-CM | POA: Diagnosis not present

## 2018-01-16 MED ORDER — VALACYCLOVIR HCL 1 G PO TABS: 1000.0000 mg | ORAL_TABLET | Freq: Three times a day (TID) | ORAL | 0 refills | Status: DC

## 2018-01-16 NOTE — Progress Notes (Signed)
Subjective:  I acted as a Education administrator for Dr. Tristan Schroeder, RMA   Patient ID: [name], female    DOB: September 28, 1961, 57 y.o.   MRN: [id number]  Chief Complaint  Patient presents with  . Rash    HPI  Patient is in today for rash on neck and again on buttocks.  Valtrex has taken care of it 2 other times.   Patient Care Team: Carollee Herter, Alferd Apa, DO as PCP - General (Family Medicine) Pyrtle, Lajuan Lines, MD as Consulting Physician (Gastroenterology) Mosetta Anis, MD as Referring Physician (Allergy)   Past Medical History:  Diagnosis Date  . Allergic rhinitis   . Anxiety   . Back pain   . Contact lens/glasses fitting   . Depression   . Endometriosis   . Fatigue   . GERD (gastroesophageal reflux disease)   . Hiatal hernia   . History of cardiac catheterization 12/1990  . History of Ostium Secundum Atrial Septal Defect 1989   Status post repair in 1989/1990  . Hx of migraines   . IBS (irritable bowel syndrome)   . Incontinence   . Intestinal volvulus [facility]) May 2007  . Loss of appetite   . Onychomycosis   . Varicose veins     Past Surgical History:  Procedure Laterality Date  . ABDOMINAL HYSTERECTOMY  2004  . ASD REPAIR, SECUNDUM  1990   Dr. Servando Snare  . BALLOON SINUPLASTY Left   . EYE SURGERY     muscle reattachment 02/2017  . hysterectomy for severe endometriosis    . NASAL SEPTUM SURGERY     Dr. Georgia Lopes  . OPEN REDUCTION INTERNAL FIXATION (ORIF) DISTAL RADIAL FRACTURE Right 06/20/2017   Procedure: OPEN REDUCTION INTERNAL FIXATION (ORIF) DISTAL RADIAL FRACTURE;  Surgeon: Leanora Cover, MD;  Location: [place];  Service: Orthopedics;  Laterality: Right;  . right ankle surgery  2010  . right leg fracture with surgery     done with ankle surgery  . TRANSTHORACIC ECHOCARDIOGRAM   January 2012   Normal LV size and function, EF greater than 55%. Mild LA dilation. No intra-atrial shunt with bubble study. Normal pulmonary pressures. Only trace to mild  mitral and tricuspid regurgitation.  . varicose laser vein surgery  2009    Family History  Problem Relation Age of Onset  . Diabetes Mother   . Colon cancer Paternal Grandfather   . Heart disease Maternal Grandfather   . Prostate cancer Maternal Grandfather   . Lung cancer Maternal Grandmother        with mets to the brain  . Clotting disorder Maternal Grandmother   . Clotting disorder Sister   . Heart disease Sister     Social History   Socioeconomic History  . Marital status: Legally Separated    Spouse name: Richard  . Number of children: Not on file  . Years of education: Not on file  . Highest education level: Not on file  Social Needs  . Financial resource strain: Not on file  . Food insecurity - worry: Not on file  . Food insecurity - inability: Not on file  . Transportation needs - medical: Not on file  . Transportation needs - non-medical: Not on file  Occupational History  . Occupation: Scientist, clinical (histocompatibility and immunogenetics): [employer]  Tobacco Use  . Smoking status: Former Smoker    Packs/day: 1.00    Years: 10.00    Pack years: 10.00    Types: Cigarettes  Last attempt to quit: 12/12/1988    Years since quitting: 29.1  . Smokeless tobacco: Never Used  Substance and Sexual Activity  . Alcohol use: Yes    Comment: social use/wine  . Drug use: No  . Sexual activity: Not on file  Other Topics Concern  . Not on file  Social History Narrative   They have 2 children and at least two grandchildren. She does all her activities of daily living. She works as a Midwife for [employer].     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 She does other days of the gym and she is able to at least 60 minutes a day 5 days a week.   She is a former smoker who quit in 1990. This was after smoking a pack a day for 10 years. She drinks social alcohol on occasion.    Outpatient  Medications Prior to Visit  Medication Sig Dispense Refill  . Calcium Carb-Cholecalciferol (CALTRATE 600+D) 600-800 MG-UNIT TABS Take 1 tablet by mouth 2 (two) times daily.    . cycloSPORINE (RESTASIS) 0.05 % ophthalmic emulsion 1 drop 2 (two) times daily.    [name] Dexlansoprazole (DEXILANT) 30 MG capsule Take 1 capsule (30 mg total) by mouth daily. 30 capsule 5  . EPIPEN 2-PAK 0.3 MG/0.3ML SOAJ injection     . FLUoxetine (PROZAC) 10 MG tablet Take 1 tablet (10 mg total) by mouth daily. 30 tablet 5  . FLUoxetine (PROZAC) 20 MG tablet take 1 tablet by mouth once daily 90 tablet 3  . fluticasone (FLONASE) 50 MCG/ACT nasal spray instill 2 sprays into each nostril once daily 16 g 1  . gabapentin (NEURONTIN) 300 MG capsule Take 1 capsule (300 mg total) by mouth 3 (three) times daily between meals as needed (Burning pain). 60 capsule 0  . levocetirizine (XYZAL) 5 MG tablet     . magnesium oxide (MAG-OX) 400 MG tablet Take 400 mg by mouth daily.    . montelukast (SINGULAIR) 10 MG tablet     . Multiple Vitamin (MULTIVITAMINS PO) Take 1 tablet by mouth daily.      . traMADol (ULTRAM) 50 MG tablet Take 1 tablet (50 mg total) by mouth every 6 (six) hours as needed. 15 tablet 0  . traMADol (ULTRAM) 50 MG tablet Take 1 tablet (50 mg total) by mouth every 6 (six) hours as needed. 20 tablet 0  . triamcinolone (KENALOG) 0.025 % cream Apply 1 application topically 2 (two) times daily. 30 g 0  . valACYclovir (VALTREX) 1000 MG tablet Take 1 tablet (1,000 mg total) by mouth 3 (three) times daily. 30 tablet 0   No facility-administered medications prior to visit.     Allergies  Allergen Reactions  . Chlordiazepoxide-Clidinium     "loopy"  . Codeine     REACTION: nausea,vomitting and dizziness  . Penicillins     REACTION: hives ---taken as a child    Review of Systems  Constitutional: Negative for fever and malaise/fatigue.  HENT: Negative for congestion.   Eyes: Negative for blurred vision.  Respiratory:  Negative for shortness of breath.   Cardiovascular: Negative for chest pain, palpitations and leg swelling.  Gastrointestinal: Negative for abdominal pain, blood in stool and nausea.  Genitourinary: Negative for dysuria and frequency.  Musculoskeletal: Negative for falls.  Skin: Negative for rash.  Neurological: Negative for dizziness, loss of consciousness and headaches.  Endo/Heme/Allergies: Negative for environmental allergies.  Psychiatric/Behavioral: Negative for depression. The patient is not nervous/anxious.        Objective:    Physical Exam  Skin: Rash noted. Rash is vesicular.     Nursing note and vitals reviewed.   BP 104/63 (BP Location: Left Arm, Patient Position: Sitting, Cuff Size: Normal)   Pulse 65   Temp 98.7 F (37.1 C) (Oral)   Resp 16   Ht 5' 10.5" (1.791 m)   Wt 180 lb 12.8 oz (82 kg)   SpO2 96%   BMI 25.58 kg/m  Wt Readings from Last 3 Encounters:  01/16/18 180 lb 12.8 oz (82 kg)  12/07/17 182 lb 4 oz (82.7 kg)  10/13/17 180 lb 3.2 oz (81.7 kg)   BP Readings from Last 3 Encounters:  01/16/18 104/63  12/07/17 102/68  10/13/17 (!) 98/52     Immunization History  Administered Date(s) Administered  . Influenza Split 10/25/2011, 10/25/2012  . Influenza Whole 09/17/2008, 10/08/2009, 11/11/2010  . Influenza,inj,Quad PF,6+ Mos 11/18/2013, 09/13/2017  . Influenza-Unspecified 09/11/2014, 11/01/2015, 11/01/2015  . Tdap 01/16/2012  . Zoster Recombinat (Shingrix) 03/17/2017, 09/13/2017    Health Maintenance  Topic Date Due  . MAMMOGRAM  02/10/2017  . PAP SMEAR  02/10/2018  . COLONOSCOPY  02/17/2021  . TETANUS/TDAP  01/15/2022  . INFLUENZA VACCINE  Completed  . Hepatitis C Screening  Completed  . HIV Screening  Completed    Lab Results  Component Value Date   WBC 7.4 03/17/2017   HGB 14.0 03/17/2017   HCT 41.9 03/17/2017   PLT 329.0 03/17/2017   GLUCOSE 82 03/17/2017   CHOL 150 03/17/2017   TRIG 99.0 03/17/2017   HDL 43.90 03/17/2017    LDLCALC 86 03/17/2017   ALT 22 03/17/2017   AST 23 03/17/2017   NA 143 03/17/2017   K 3.9 03/17/2017   CL 106 03/17/2017   CREATININE 0.76 03/17/2017   BUN 17 03/17/2017   CO2 32 03/17/2017   TSH 2.86 03/17/2017    Lab Results  Component Value Date   TSH 2.86 03/17/2017   Lab Results  Component Value Date   WBC 7.4 03/17/2017   HGB 14.0 03/17/2017   HCT 41.9 03/17/2017   MCV 92.1 03/17/2017   PLT 329.0 03/17/2017   Lab Results  Component Value Date   NA 143 03/17/2017   K 3.9 03/17/2017   CO2 32 03/17/2017   GLUCOSE 82 03/17/2017   BUN 17 03/17/2017   CREATININE 0.76 03/17/2017   BILITOT 0.8 03/17/2017   ALKPHOS 45 03/17/2017   AST 23 03/17/2017   ALT 22 03/17/2017   PROT 6.6 03/17/2017   ALBUMIN 4.2 03/17/2017   CALCIUM 9.1 03/17/2017   GFR 83.26 03/17/2017   Lab Results  Component Value Date   CHOL 150 03/17/2017   Lab Results  Component Value Date   HDL 43.90 03/17/2017   Lab Results  Component Value Date   LDLCALC 86 03/17/2017   Lab Results  Component Value Date   TRIG 99.0 03/17/2017   Lab Results  Component Value Date   CHOLHDL 3 03/17/2017   No results found for: HGBA1C       Assessment & Plan:   Problem List Items Addressed This Visit    None    Visit Diagnoses    Herpes zoster without complication       Relevant Medications   valACYclovir (VALTREX) 1000 MG tablet    question whether it is herpes zoster Cultures done  Consider bx vs derm  I am having [name] "Barbie" maintain her Multiple Vitamin (MULTIVITAMINS PO), Calcium Carb-Cholecalciferol, magnesium oxide, levocetirizine, montelukast, EPIPEN 2-PAK, cycloSPORINE, FLUoxetine, traMADol, traMADol, FLUoxetine, fluticasone, triamcinolone, Dexlansoprazole, gabapentin, and valACYclovir.  Meds ordered this encounter  Medications  . valACYclovir (VALTREX) 1000 MG tablet    Sig: Take 1 tablet (1,000 mg total) by mouth 3 (three) times daily.    Dispense:  30 tablet     Refill:  0    CMA served as scribe during this visit. History, Physical and Plan performed by medical provider. Documentation and orders reviewed and attested to.   Ann Held, DO

## 2018-01-17 ENCOUNTER — Other Ambulatory Visit: Payer: Self-pay | Admitting: *Deleted

## 2018-01-17 MED ORDER — FLUOXETINE HCL 20 MG PO CAPS: 20.0000 mg | ORAL_CAPSULE | Freq: Every day | ORAL | 1 refills | Status: DC

## 2018-01-18 LAB — HERPES SIMPLEX VIRUS CULTURE
MICRO NUMBER:: 90153977
SPECIMEN QUALITY:: ADEQUATE

## 2018-01-19 LAB — WOUND CULTURE
MICRO NUMBER:: 90153892
SPECIMEN QUALITY:: ADEQUATE

## 2018-01-22 ENCOUNTER — Encounter: Payer: Self-pay | Admitting: Family Medicine

## 2018-01-22 DIAGNOSIS — R21 Rash and other nonspecific skin eruption: Secondary | ICD-10-CM

## 2018-01-22 NOTE — Telephone Encounter (Signed)
Ok to refer to derm?

## 2018-01-23 ENCOUNTER — Other Ambulatory Visit: Payer: Self-pay | Admitting: Family Medicine

## 2018-01-23 NOTE — Telephone Encounter (Signed)
See previous mychart message from 01/22/18

## 2018-01-23 NOTE — Telephone Encounter (Signed)
Gwen states she got pt app with dr Allyson Sabal

## 2018-01-23 NOTE — Addendum Note (Signed)
Addended by: Wynonia Musty A on: 01/23/2018 01:29 PM   Modules accepted: Orders

## 2018-01-23 NOTE — Telephone Encounter (Signed)
Dr. Carollee Herter your schedule is full today is there a time you would like to work her in? She could come today or tomorrow but you are not here tomorrow.

## 2018-01-23 NOTE — Telephone Encounter (Signed)
Referral has been placed to dermatology.

## 2018-01-23 NOTE — Telephone Encounter (Signed)
Talked to patient she was not aware of the punch biopsy suggestion, she will like to get the biopsy if you are still able to do it from the outbreak she has at this time. She sent you a picture. She can come in today or tomorrow if you are able to do biopsy at this time.

## 2018-01-28 ENCOUNTER — Telehealth: Payer: 59 | Admitting: Family

## 2018-01-28 DIAGNOSIS — R6889 Other general symptoms and signs: Secondary | ICD-10-CM

## 2018-01-28 MED ORDER — OSELTAMIVIR PHOSPHATE 75 MG PO CAPS: 75.0000 mg | ORAL_CAPSULE | Freq: Two times a day (BID) | ORAL | 0 refills | Status: DC

## 2018-01-28 NOTE — Progress Notes (Signed)

## 2018-01-31 ENCOUNTER — Ambulatory Visit: Payer: 59 | Admitting: Family Medicine

## 2018-01-31 DIAGNOSIS — Z0289 Encounter for other administrative examinations: Secondary | ICD-10-CM

## 2018-02-02 ENCOUNTER — Ambulatory Visit: Payer: Self-pay

## 2018-02-02 ENCOUNTER — Ambulatory Visit: Payer: 59 | Admitting: Family Medicine

## 2018-02-02 ENCOUNTER — Encounter: Payer: Self-pay | Admitting: Family Medicine

## 2018-02-02 VITALS — BP 102/68 | HR 58 | Temp 98.6°F | Ht 71.0 in | Wt 179.2 lb

## 2018-02-02 DIAGNOSIS — J0100 Acute maxillary sinusitis, unspecified: Secondary | ICD-10-CM

## 2018-02-02 MED ORDER — METHYLPREDNISOLONE ACETATE 80 MG/ML IJ SUSP
80.0000 mg | Freq: Once | INTRAMUSCULAR | Status: AC
  Administered 2018-02-02: 80 mg via INTRAMUSCULAR

## 2018-02-02 MED ORDER — DOXYCYCLINE HYCLATE 100 MG PO TABS: 100.0000 mg | ORAL_TABLET | Freq: Two times a day (BID) | ORAL | 0 refills | Status: DC

## 2018-02-02 NOTE — Patient Instructions (Signed)
Most sinus infections are viral in etiology and antibiotics will not be helpful. That being said, if you start having worsening symptoms over 3 days, you are worsening by day 10 or not improving by day 14, go ahead and take it. You are on Day 4 as of now.  Continue to push fluids, practice good hand hygiene, and cover your mouth if you cough.  If you start having fevers, shaking or shortness of breath, seek immediate care.  OK to take Tylenol 1000 mg (2 extra strength tabs) or 975 mg (3 regular strength tabs) every 6 hours as needed.  Ibuprofen 400-600 mg (2-3 over the counter strength tabs) every 6 hours as needed for pain.  Let us know if you need anything.

## 2018-02-02 NOTE — Progress Notes (Signed)
Chief Complaint  Patient presents with  . Follow-up    congestion    [name] here for URI complaints.  Duration: 4 days   Associated symptoms: sinus congestion, sinus pain and rhinorrhea Denies: itchy watery eyes, ear pain, ear drainage, sore throat, wheezing, shortness of breath, myalgia and fever Treatment to date: Tamiflu, ibuprofen, Tylenol Sick contacts: No  ROS:  Const: Denies fevers HEENT: As noted in HPI Lungs: No SOB  Past Medical History:  Diagnosis Date  . Allergic rhinitis   . Anxiety   . Back pain   . Contact lens/glasses fitting   . Depression   . Endometriosis   . Fatigue   . GERD (gastroesophageal reflux disease)   . Hiatal hernia   . History of cardiac catheterization 12/1990  . History of Ostium Secundum Atrial Septal Defect 1989   Status post repair in 1989/1990  . Hx of migraines   . IBS (irritable bowel syndrome)   . Incontinence   . Intestinal volvulus [facility]) May 2007  . Loss of appetite   . Onychomycosis   . Varicose veins    Family History  Problem Relation Age of Onset  . Diabetes Mother   . Colon cancer Paternal Grandfather   . Heart disease Maternal Grandfather   . Prostate cancer Maternal Grandfather   . Lung cancer Maternal Grandmother        with mets to the brain  . Clotting disorder Maternal Grandmother   . Clotting disorder Sister   . Heart disease Sister     BP 102/68 (BP Location: Left Arm, Patient Position: Sitting, Cuff Size: Normal)   Pulse (!) 58   Temp 98.6 F (37 C) (Oral)   Ht 5\' 11"  (1.803 m)   Wt 179 lb 4 oz (81.3 kg)   SpO2 93%   BMI 25.00 kg/m  General: Awake, alert, appears stated age HEENT: AT, [place], ears patent b/l and TM's neg, nares patent w/o discharge, pharynx pink and without exudates, MMM Neck: No masses or asymmetry Heart: RRR Lungs: CTAB, no accessory muscle use Psych: Age appropriate judgment and insight, normal mood and affect  Acute maxillary sinusitis, recurrence not  specified  Orders as above. Pocket rx given. Ibuprofen, Tylenol. Continue to push fluids, practice good hand hygiene, cover mouth when coughing. F/u prn. If starting to experience fevers, shaking, or shortness of breath, seek immediate care. Pt voiced understanding and agreement to the plan.  [place], DO 02/02/18 3:59 PM

## 2018-02-02 NOTE — Addendum Note (Signed)
Addended by: Sharon Seller B on: 02/02/2018 04:09 PM   Modules accepted: Orders

## 2018-02-02 NOTE — Telephone Encounter (Signed)
Pt has appt w/ Dr. Nani Ravens this afternoon.

## 2018-02-02 NOTE — Progress Notes (Signed)
Pre visit review using our clinic review tool, if applicable. No additional management support is needed unless otherwise documented below in the visit note. 

## 2018-02-02 NOTE — Telephone Encounter (Signed)
Pt. States getting over the flu - finished Tamiflu. Continues with sinus pain and pressure with green and bloody drainage. History of sinus surgery and sinus infections.  Reason for Disposition . [1] Sinus congestion (pressure, fullness) AND [2] present > 10 days  Answer Assessment - Initial Assessment Questions 1. LOCATION: "Where does it hurt?"      Sinus pressure more on the left 2. ONSET: "When did the sinus pain start?"  (e.g., hours, days)      Sunday 3. SEVERITY: "How bad is the pain?"   (Scale 1-10; mild, moderate or severe)   - MILD (1-3): doesn't interfere with normal activities    - MODERATE (4-7): interferes with normal activities (e.g., work or school) or awakens from sleep   - SEVERE (8-10): excruciating pain and patient unable to do any normal activities        6 4. RECURRENT SYMPTOM: "Have you ever had sinus problems before?" If so, ask: "When was the last time?" and "What happened that time?"      Yes 5. NASAL CONGESTION: "Is the nose blocked?" If so, ask, "Can you open it or must you breathe through the mouth?"     Not blocked 6. NASAL DISCHARGE: "Do you have discharge from your nose?" If so ask, "What color?"      Green with blood 7. FEVER: "Do you have a fever?" If so, ask: "What is it, how was it measured, and when did it start?"      No  8. OTHER SYMPTOMS: "Do you have any other symptoms?" (e.g., sore throat, cough, earache, difficulty breathing)     No 9. PREGNANCY: "Is there any chance you are pregnant?" "When was your last menstrual period?"     No  Protocols used: SINUS PAIN OR CONGESTION-A-AH

## 2018-02-06 DIAGNOSIS — J3089 Other allergic rhinitis: Secondary | ICD-10-CM | POA: Diagnosis not present

## 2018-02-08 DIAGNOSIS — J3089 Other allergic rhinitis: Secondary | ICD-10-CM | POA: Diagnosis not present

## 2018-02-13 DIAGNOSIS — J3089 Other allergic rhinitis: Secondary | ICD-10-CM | POA: Diagnosis not present

## 2018-02-16 DIAGNOSIS — J3089 Other allergic rhinitis: Secondary | ICD-10-CM | POA: Diagnosis not present

## 2018-02-19 DIAGNOSIS — J3089 Other allergic rhinitis: Secondary | ICD-10-CM | POA: Diagnosis not present

## 2018-02-22 DIAGNOSIS — J3089 Other allergic rhinitis: Secondary | ICD-10-CM | POA: Diagnosis not present

## 2018-02-23 ENCOUNTER — Telehealth: Payer: Self-pay | Admitting: *Deleted

## 2018-02-23 DIAGNOSIS — Z0000 Encounter for general adult medical examination without abnormal findings: Secondary | ICD-10-CM

## 2018-02-23 NOTE — Telephone Encounter (Signed)
Copied from [place] [id number]. Topic: Appointment Scheduling - Scheduling Inquiry for Clinic >> Feb 23, 2018 10:48 AM Lawson, Candace Emperor wrote: Reason for CRM: Pt would like to come in the morning off May 10 to have her CPE labs done.  CPE appt May 10 @ 2:30

## 2018-02-26 DIAGNOSIS — J3089 Other allergic rhinitis: Secondary | ICD-10-CM | POA: Diagnosis not present

## 2018-02-26 NOTE — Telephone Encounter (Signed)
Patient notified that labs were ordered.

## 2018-03-02 DIAGNOSIS — J3089 Other allergic rhinitis: Secondary | ICD-10-CM | POA: Diagnosis not present

## 2018-03-05 DIAGNOSIS — J3089 Other allergic rhinitis: Secondary | ICD-10-CM | POA: Diagnosis not present

## 2018-03-07 ENCOUNTER — Other Ambulatory Visit: Payer: Self-pay | Admitting: Family Medicine

## 2018-03-08 DIAGNOSIS — J3089 Other allergic rhinitis: Secondary | ICD-10-CM | POA: Diagnosis not present

## 2018-03-12 DIAGNOSIS — J3089 Other allergic rhinitis: Secondary | ICD-10-CM | POA: Diagnosis not present

## 2018-03-15 DIAGNOSIS — J3089 Other allergic rhinitis: Secondary | ICD-10-CM | POA: Diagnosis not present

## 2018-03-20 DIAGNOSIS — J3089 Other allergic rhinitis: Secondary | ICD-10-CM | POA: Diagnosis not present

## 2018-03-22 DIAGNOSIS — J3089 Other allergic rhinitis: Secondary | ICD-10-CM | POA: Diagnosis not present

## 2018-03-26 DIAGNOSIS — J3089 Other allergic rhinitis: Secondary | ICD-10-CM | POA: Diagnosis not present

## 2018-03-29 DIAGNOSIS — J3089 Other allergic rhinitis: Secondary | ICD-10-CM | POA: Diagnosis not present

## 2018-04-03 DIAGNOSIS — J3089 Other allergic rhinitis: Secondary | ICD-10-CM | POA: Diagnosis not present

## 2018-04-06 ENCOUNTER — Telehealth: Payer: Self-pay

## 2018-04-06 DIAGNOSIS — L659 Nonscarring hair loss, unspecified: Secondary | ICD-10-CM

## 2018-04-06 DIAGNOSIS — Z79899 Other long term (current) drug therapy: Secondary | ICD-10-CM

## 2018-04-06 DIAGNOSIS — J3089 Other allergic rhinitis: Secondary | ICD-10-CM | POA: Diagnosis not present

## 2018-04-06 DIAGNOSIS — Z0000 Encounter for general adult medical examination without abnormal findings: Secondary | ICD-10-CM

## 2018-04-06 DIAGNOSIS — Z13220 Encounter for screening for lipoid disorders: Secondary | ICD-10-CM

## 2018-04-06 NOTE — Telephone Encounter (Signed)
Copied from [place]. Topic: General - Other >> Apr 06, 2018 11:04 AM Valla Leaver wrote: Reason for CRM: Patient would like to switch PCP from Dr. Etter Sjogren to Dr. Deborra Medina at [place]. Please advise patient on whether or not this request is approved. The [place] office is very close to her home.

## 2018-04-06 NOTE — Telephone Encounter (Signed)
Please advise 

## 2018-04-06 NOTE — Telephone Encounter (Signed)
Fine with me--- please send approval to [facility] so they can call pt and schedule

## 2018-04-09 ENCOUNTER — Other Ambulatory Visit: Payer: Self-pay | Admitting: Family Medicine

## 2018-04-09 DIAGNOSIS — R21 Rash and other nonspecific skin eruption: Secondary | ICD-10-CM | POA: Diagnosis not present

## 2018-04-09 NOTE — Telephone Encounter (Signed)
Okay with me 

## 2018-04-10 DIAGNOSIS — J3089 Other allergic rhinitis: Secondary | ICD-10-CM | POA: Diagnosis not present

## 2018-04-10 NOTE — Telephone Encounter (Signed)
TA-I have pt scheduled for 5.30.19 for a CPE with PAP with you/I also have her scheduled for a lab visit 1 week prior/Dr. Etter Sjogren had her scheduled for a CMP; Lipid; TSH; CBC/do you want the same ordered for her? Plz advise and I will complete/thx dmf

## 2018-04-10 NOTE — Telephone Encounter (Signed)
Per TA future orders placed/thx dmf

## 2018-04-10 NOTE — Telephone Encounter (Signed)
Yes okay to keep same orders.

## 2018-04-12 DIAGNOSIS — J3089 Other allergic rhinitis: Secondary | ICD-10-CM | POA: Diagnosis not present

## 2018-04-17 DIAGNOSIS — J301 Allergic rhinitis due to pollen: Secondary | ICD-10-CM | POA: Diagnosis not present

## 2018-04-17 DIAGNOSIS — J3081 Allergic rhinitis due to animal (cat) (dog) hair and dander: Secondary | ICD-10-CM | POA: Diagnosis not present

## 2018-04-17 DIAGNOSIS — J3089 Other allergic rhinitis: Secondary | ICD-10-CM | POA: Diagnosis not present

## 2018-04-20 ENCOUNTER — Encounter: Payer: 59 | Admitting: Family Medicine

## 2018-04-20 ENCOUNTER — Other Ambulatory Visit: Payer: 59

## 2018-04-20 DIAGNOSIS — J3089 Other allergic rhinitis: Secondary | ICD-10-CM | POA: Diagnosis not present

## 2018-04-26 ENCOUNTER — Ambulatory Visit: Payer: 59 | Admitting: Family Medicine

## 2018-04-26 ENCOUNTER — Encounter: Payer: Self-pay | Admitting: Family Medicine

## 2018-04-26 VITALS — BP 116/64 | HR 70 | Temp 99.2°F | Ht 71.0 in | Wt 179.0 lb

## 2018-04-26 DIAGNOSIS — B9689 Other specified bacterial agents as the cause of diseases classified elsewhere: Secondary | ICD-10-CM | POA: Diagnosis not present

## 2018-04-26 DIAGNOSIS — J019 Acute sinusitis, unspecified: Secondary | ICD-10-CM | POA: Diagnosis not present

## 2018-04-26 MED ORDER — AZITHROMYCIN 250 MG PO TABS: ORAL_TABLET | ORAL | 0 refills | Status: DC

## 2018-04-26 NOTE — Patient Instructions (Signed)
Great to meet you.  Take zpack as directed.

## 2018-04-26 NOTE — Progress Notes (Signed)
Subjective:   Patient ID: [name], female    DOB: 30-Jun-1961, 57 y.o.   MRN: [id number]  [name] is a pleasant 57 y.o. year old female who presents to clinic today with Sinusitis (Patient is here today C/O sinus presure since last Thursday.  She is blowing green and bloody mucous from nose.  AU pressure and pain.  Intermittent cough with green sputum.  H/O sinus surgery 12.28.19.) and HSV (Patient would also like to talk about new Dx of Herpes from the dermatologist.  Originally thought it was shingles but the HSV IGG came back positive on her right buttock.)  on 04/26/2018  HPI:  Pt is new to me.  Chart reviewed- she is transferring care to our office from another [employer].  Sinus pressure for a week.  Blowing green/bloody mucous from her nose.  Ear pressure and pain. Cough is intermittently productive.  H/o sinus surgery in 11/2017.   Using flonase, zyzal, singulair and receiving allergy shots.   Current Outpatient Medications on File Prior to Visit  Medication Sig Dispense Refill  . Calcium Carb-Cholecalciferol (CALTRATE 600+D) 600-800 MG-UNIT TABS Take 1 tablet by mouth 2 (two) times daily.    . cycloSPORINE (RESTASIS) 0.05 % ophthalmic emulsion 1 drop 2 (two) times daily.    [name] DEXILANT 60 MG capsule TAKE ONE CAPSULE BY MOUTH ONCE DAILY 90 capsule 0  . Dexlansoprazole (DEXILANT) 30 MG capsule Take 1 capsule (30 mg total) by mouth daily. 30 capsule 5  . EPIPEN 2-PAK 0.3 MG/0.3ML SOAJ injection     . FLUoxetine (PROZAC) 20 MG capsule Take 1 capsule (20 mg total) by mouth daily. 90 capsule 1  . fluticasone (FLONASE) 50 MCG/ACT nasal spray instill 2 sprays into each nostril once daily 16 g 1  . gabapentin (NEURONTIN) 300 MG capsule Take 1 capsule (300 mg total) by mouth 3 (three) times daily between meals as needed (Burning pain). 60 capsule 0  . levocetirizine (XYZAL) 5 MG tablet     . magnesium oxide (MAG-OX) 400 MG tablet Take 400 mg by mouth daily.    .  montelukast (SINGULAIR) 10 MG tablet     . Multiple Vitamin (MULTIVITAMINS PO) Take 1 tablet by mouth daily.      . traMADol (ULTRAM) 50 MG tablet Take 1 tablet (50 mg total) by mouth every 6 (six) hours as needed. 20 tablet 0  . triamcinolone (KENALOG) 0.025 % cream Apply 1 application topically 2 (two) times daily. 30 g 0  . valACYclovir (VALTREX) 1000 MG tablet Take 1 tablet (1,000 mg total) by mouth 3 (three) times daily. 30 tablet 0   No current facility-administered medications on file prior to visit.     Allergies  Allergen Reactions  . Chlordiazepoxide-Clidinium     "loopy"  . Codeine     REACTION: nausea,vomitting and dizziness  . Penicillins     REACTION: hives ---taken as a child    Past Medical History:  Diagnosis Date  . Allergic rhinitis   . Anxiety   . Back pain   . Contact lens/glasses fitting   . Depression   . Endometriosis   . Fatigue   . GERD (gastroesophageal reflux disease)   . Hiatal hernia   . History of cardiac catheterization 12/1990  . History of Ostium Secundum Atrial Septal Defect 1989   Status post repair in 1989/1990  . Hx of migraines   . IBS (irritable bowel syndrome)   . Incontinence   . Intestinal  volvulus [facility]) May 2007  . Loss of appetite   . Onychomycosis   . Varicose veins     Past Surgical History:  Procedure Laterality Date  . ABDOMINAL HYSTERECTOMY  2004  . ASD REPAIR, SECUNDUM  1990   Dr. Servando Snare  . BALLOON SINUPLASTY Left   . EYE SURGERY     muscle reattachment 02/2017  . hysterectomy for severe endometriosis    . NASAL SEPTUM SURGERY     Dr. Georgia Lopes  . OPEN REDUCTION INTERNAL FIXATION (ORIF) DISTAL RADIAL FRACTURE Right 06/20/2017   Procedure: OPEN REDUCTION INTERNAL FIXATION (ORIF) DISTAL RADIAL FRACTURE;  Surgeon: Leanora Cover, MD;  Location: [place];  Service: Orthopedics;  Laterality: Right;  . right ankle surgery  2010  . right leg fracture with surgery     done with ankle surgery  .  TRANSTHORACIC ECHOCARDIOGRAM   January 2012   Normal LV size and function, EF greater than 55%. Mild LA dilation. No intra-atrial shunt with bubble study. Normal pulmonary pressures. Only trace to mild mitral and tricuspid regurgitation.  . varicose laser vein surgery  2009    Family History  Problem Relation Age of Onset  . Diabetes Mother   . Colon cancer Paternal Grandfather   . Heart disease Maternal Grandfather   . Prostate cancer Maternal Grandfather   . Lung cancer Maternal Grandmother        with mets to the brain  . Clotting disorder Maternal Grandmother   . Clotting disorder Sister   . Heart disease Sister     Social History   Socioeconomic History  . Marital status: Legally Separated    Spouse name: Richard  . Number of children: Not on file  . Years of education: Not on file  . Highest education level: Not on file  Occupational History  . Occupation: Scientist, clinical (histocompatibility and immunogenetics): [employer]  . Financial resource strain: Not on file  . Food insecurity:    Worry: Not on file    Inability: Not on file  . Transportation needs:    Medical: Not on file    Non-medical: Not on file  Tobacco Use  . Smoking status: Former Smoker    Packs/day: 1.00    Years: 10.00    Pack years: 10.00    Types: Cigarettes    Last attempt to quit: 12/12/1988    Years since quitting: 29.3  . Smokeless tobacco: Never Used  Substance and Sexual Activity  . Alcohol use: Yes    Comment: social use/wine  . Drug use: No  . Sexual activity: Not on file  Lifestyle  . Physical activity:    Days per week: Not on file    Minutes per session: Not on file  . Stress: Not on file  Relationships  . Social connections:    Talks on phone: Not on file    Gets together: Not on file    Attends religious service: Not on file    Active member of club or organization: Not on file    Attends meetings of clubs or organizations: Not on file    Relationship status: Not on file  .  Intimate partner violence:    Fear of current or ex partner: Not on file    Emotionally abused: Not on file    Physically abused: Not on file    Forced sexual activity: Not on file  Other Topics Concern  . Not on file  Social  History Narrative   They have 2 children and at least two grandchildren. She does all her activities of daily living. She works as a Midwife for [employer].     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 She does other days of the gym and she is able to at least 60 minutes a day 5 days a week.   She is a former smoker who quit in 1990. This was after smoking a pack a day for 10 years. She drinks social alcohol on occasion.   The PMH, PSH, Social History, Family History, Medications, and allergies have been reviewed in [facility], and have been updated if relevant.   Review of Systems  Constitutional: Positive for fatigue. Negative for fever.  HENT: Positive for congestion, rhinorrhea, sinus pain and sore throat.   Eyes: Negative.   Respiratory: Positive for cough. Negative for shortness of breath, wheezing and stridor.   Cardiovascular: Negative.   Gastrointestinal: Negative.   Endocrine: Negative.   Genitourinary: Negative.   Musculoskeletal: Negative.   Allergic/Immunologic: Negative.   Neurological: Negative.   Hematological: Negative.   Psychiatric/Behavioral: Negative.   All other systems reviewed and are negative.      Objective:    BP 116/64 (BP Location: Left Arm, Patient Position: Sitting, Cuff Size: Normal)   Pulse 70   Temp 99.2 F (37.3 C) (Oral) Comment: skin feels quite hot  Ht 5\' 11"  (1.803 m)   Wt 179 lb (81.2 kg)   SpO2 96%   BMI 24.97 kg/m    Physical Exam  Constitutional: She is oriented to person, place, and time. She appears well-developed and well-nourished. No distress.  HENT:  Head: Normocephalic and atraumatic.  Right Ear: A  middle ear effusion is present.  Left Ear: Tympanic membrane is retracted.  Nose: Mucosal edema present. Right sinus exhibits maxillary sinus tenderness and frontal sinus tenderness. Left sinus exhibits maxillary sinus tenderness and frontal sinus tenderness.  Mouth/Throat: Posterior oropharyngeal erythema present. No posterior oropharyngeal edema.  Eyes: Pupils are equal, round, and reactive to light. EOM are normal.  Neck: Neck supple.  Cardiovascular: Normal rate and regular rhythm.  Pulmonary/Chest: Effort normal and breath sounds normal.  Musculoskeletal: Normal range of motion. She exhibits no edema.  Neurological: She is alert and oriented to person, place, and time.  Skin: Skin is warm and dry. She is not diaphoretic.  Psychiatric: She has a normal mood and affect. Her behavior is normal. Judgment and thought content normal.  Nursing note and vitals reviewed.         Assessment & Plan:   Acute bacterial sinusitis No follow-ups on file.

## 2018-04-27 NOTE — Assessment & Plan Note (Signed)
Given duration and progression of symptoms, will treat for bacterial sinusitis with zpack.  PCN allergic.  Continue supportive care as she has been doing . Call or return to clinic prn if these symptoms worsen or fail to improve as anticipated. The patient indicates understanding of these issues and agrees with the plan.

## 2018-04-30 ENCOUNTER — Encounter: Payer: Self-pay | Admitting: Family Medicine

## 2018-04-30 ENCOUNTER — Ambulatory Visit: Payer: 59 | Admitting: Family Medicine

## 2018-04-30 NOTE — Progress Notes (Unsigned)
Subjective:   Patient ID: [name], female    DOB: [birth year], 59 y.o.   MRN: [id number]  [name] is a pleasant 58 y.o. year old female who presents to clinic today with Rash (Patient is here today C/O a rash.  She has just one spot on her left buttock that appeared on 5.19.19 in the am.  Denies any pain.)  on 04/30/2018  HPI:    Current Outpatient Medications on File Prior to Visit  Medication Sig Dispense Refill  . Calcium Carb-Cholecalciferol (CALTRATE 600+D) 600-800 MG-UNIT TABS Take 1 tablet by mouth 2 (two) times daily.    . cycloSPORINE (RESTASIS) 0.05 % ophthalmic emulsion 1 drop 2 (two) times daily.    [name] DEXILANT 60 MG capsule TAKE ONE CAPSULE BY MOUTH ONCE DAILY 90 capsule 0  . EPIPEN 2-PAK 0.3 MG/0.3ML SOAJ injection     . FLUoxetine (PROZAC) 20 MG capsule Take 1 capsule (20 mg total) by mouth daily. 90 capsule 1  . fluticasone (FLONASE) 50 MCG/ACT nasal spray instill 2 sprays into each nostril once daily 16 g 1  . levocetirizine (XYZAL) 5 MG tablet     . magnesium oxide (MAG-OX) 400 MG tablet Take 400 mg by mouth daily.    . montelukast (SINGULAIR) 10 MG tablet     . Multiple Vitamin (MULTIVITAMINS PO) Take 1 tablet by mouth daily.      [name] triamcinolone (KENALOG) 0.025 % cream Apply 1 application topically 2 (two) times daily. 30 g 0  . valACYclovir (VALTREX) 1000 MG tablet Take 1 tablet (1,000 mg total) by mouth 3 (three) times daily. 30 tablet 0   No current facility-administered medications on file prior to visit.     Allergies  Allergen Reactions  . Chlordiazepoxide-Clidinium     "loopy"  . Codeine     REACTION: nausea,vomitting and dizziness  . Penicillins     REACTION: hives ---taken as a child    Past Medical History:  Diagnosis Date  . Allergic rhinitis   . Anxiety   . Back pain   . Contact lens/glasses fitting   . Depression   . Endometriosis   . Fatigue   . GERD (gastroesophageal reflux disease)   . Hiatal hernia   . History of  cardiac catheterization 12/1990  . History of Ostium Secundum Atrial Septal Defect 1989   Status post repair in 1989/1990  . Hx of migraines   . IBS (irritable bowel syndrome)   . Incontinence   . Intestinal volvulus [facility]) May 2007  . Loss of appetite   . Onychomycosis   . Varicose veins     Past Surgical History:  Procedure Laterality Date  . ABDOMINAL HYSTERECTOMY  2004  . ASD REPAIR, SECUNDUM  1990   Dr. Servando Snare  . BALLOON SINUPLASTY Left   . EYE SURGERY     muscle reattachment 02/2017  . hysterectomy for severe endometriosis    . NASAL SEPTUM SURGERY     Dr. Georgia Lopes  . OPEN REDUCTION INTERNAL FIXATION (ORIF) DISTAL RADIAL FRACTURE Right 06/20/2017   Procedure: OPEN REDUCTION INTERNAL FIXATION (ORIF) DISTAL RADIAL FRACTURE;  Surgeon: Leanora Cover, MD;  Location: [place];  Service: Orthopedics;  Laterality: Right;  . right ankle surgery  2010  . right leg fracture with surgery     done with ankle surgery  . TRANSTHORACIC ECHOCARDIOGRAM   January 2012   Normal LV size and function, EF greater than 55%. Mild LA dilation. No intra-atrial shunt  with bubble study. Normal pulmonary pressures. Only trace to mild mitral and tricuspid regurgitation.  . varicose laser vein surgery  2009    Family History  Problem Relation Age of Onset  . Diabetes Mother   . Colon cancer Paternal Grandfather   . Heart disease Maternal Grandfather   . Prostate cancer Maternal Grandfather   . Lung cancer Maternal Grandmother        with mets to the brain  . Clotting disorder Maternal Grandmother   . Clotting disorder Sister   . Heart disease Sister     Social History   Socioeconomic History  . Marital status: Legally Separated    Spouse name: Richard  . Number of children: Not on file  . Years of education: Not on file  . Highest education level: Not on file  Occupational History  . Occupation: Scientist, clinical (histocompatibility and immunogenetics): [employer]  . Financial  resource strain: Not on file  . Food insecurity:    Worry: Not on file    Inability: Not on file  . Transportation needs:    Medical: Not on file    Non-medical: Not on file  Tobacco Use  . Smoking status: Former Smoker    Packs/day: 1.00    Years: 10.00    Pack years: 10.00    Types: Cigarettes    Last attempt to quit: 12/12/1988    Years since quitting: 29.4  . Smokeless tobacco: Never Used  Substance and Sexual Activity  . Alcohol use: Yes    Comment: social use/wine  . Drug use: No  . Sexual activity: Not on file  Lifestyle  . Physical activity:    Days per week: Not on file    Minutes per session: Not on file  . Stress: Not on file  Relationships  . Social connections:    Talks on phone: Not on file    Gets together: Not on file    Attends religious service: Not on file    Active member of club or organization: Not on file    Attends meetings of clubs or organizations: Not on file    Relationship status: Not on file  . Intimate partner violence:    Fear of current or ex partner: Not on file    Emotionally abused: Not on file    Physically abused: Not on file    Forced sexual activity: Not on file  Other Topics Concern  . Not on file  Social History Narrative   They have 2 children and at least two grandchildren. She does all her activities of daily living. She works as a Midwife for [employer].     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 She does other days of the gym and she is able to at least 60 minutes a day 5 days a week.   She is a former smoker who quit in 1990. This was after smoking a pack a day for 10 years. She drinks social alcohol on occasion.   The PMH, PSH, Social History, Family History, Medications, and allergies have been reviewed in [facility], and have been updated if relevant.   Review of Systems     Objective:    BP 116/66 (BP Location:  Left Arm, Patient Position: Sitting, Cuff Size: Normal)   Pulse 61   Temp 99 F (37.2 C) (  Oral)   Ht 5\' 11"  (1.803 m)   Wt 176 lb 12.8 oz (80.2 kg)   SpO2 97%   BMI 24.66 kg/m    Physical Exam        Assessment & Plan:   No diagnosis found. No follow-ups on file.

## 2018-05-02 DIAGNOSIS — J3089 Other allergic rhinitis: Secondary | ICD-10-CM | POA: Diagnosis not present

## 2018-05-03 ENCOUNTER — Other Ambulatory Visit (INDEPENDENT_AMBULATORY_CARE_PROVIDER_SITE_OTHER): Payer: 59

## 2018-05-03 DIAGNOSIS — Z79899 Other long term (current) drug therapy: Secondary | ICD-10-CM | POA: Diagnosis not present

## 2018-05-03 DIAGNOSIS — Z0000 Encounter for general adult medical examination without abnormal findings: Secondary | ICD-10-CM | POA: Diagnosis not present

## 2018-05-03 DIAGNOSIS — Z13220 Encounter for screening for lipoid disorders: Secondary | ICD-10-CM

## 2018-05-03 DIAGNOSIS — J3089 Other allergic rhinitis: Secondary | ICD-10-CM | POA: Diagnosis not present

## 2018-05-03 DIAGNOSIS — L659 Nonscarring hair loss, unspecified: Secondary | ICD-10-CM

## 2018-05-03 LAB — CBC WITH DIFFERENTIAL/PLATELET
BASOS ABS: 0.1 10*3/uL (ref 0.0–0.1)
Basophils Relative: 0.7 % (ref 0.0–3.0)
EOS ABS: 0.2 10*3/uL (ref 0.0–0.7)
Eosinophils Relative: 2.2 % (ref 0.0–5.0)
HCT: 42.8 % (ref 36.0–46.0)
Hemoglobin: 14.2 g/dL (ref 12.0–15.0)
LYMPHS ABS: 3.6 10*3/uL (ref 0.7–4.0)
LYMPHS PCT: 39.9 % (ref 12.0–46.0)
MCHC: 33.2 g/dL (ref 30.0–36.0)
MCV: 90.1 fl (ref 78.0–100.0)
MONOS PCT: 9.7 % (ref 3.0–12.0)
Monocytes Absolute: 0.9 10*3/uL (ref 0.1–1.0)
NEUTROS ABS: 4.3 10*3/uL (ref 1.4–7.7)
NEUTROS PCT: 47.5 % (ref 43.0–77.0)
PLATELETS: 374 10*3/uL (ref 150.0–400.0)
RBC: 4.75 Mil/uL (ref 3.87–5.11)
RDW: 13.7 % (ref 11.5–15.5)
WBC: 9 10*3/uL (ref 4.0–10.5)

## 2018-05-03 LAB — COMPREHENSIVE METABOLIC PANEL
ALBUMIN: 3.9 g/dL (ref 3.5–5.2)
ALK PHOS: 66 U/L (ref 39–117)
ALT: 14 U/L (ref 0–35)
AST: 18 U/L (ref 0–37)
BILIRUBIN TOTAL: 0.7 mg/dL (ref 0.2–1.2)
BUN: 20 mg/dL (ref 6–23)
CALCIUM: 9.3 mg/dL (ref 8.4–10.5)
CO2: 32 mEq/L (ref 19–32)
Chloride: 103 mEq/L (ref 96–112)
Creatinine, Ser: 0.74 mg/dL (ref 0.40–1.20)
GFR: 85.52 mL/min (ref >60.00)
GLUCOSE: 96 mg/dL (ref 70–99)
Potassium: 4.1 mEq/L (ref 3.5–5.1)
Sodium: 142 mEq/L (ref 135–145)
TOTAL PROTEIN: 6.5 g/dL (ref 6.0–8.3)

## 2018-05-03 LAB — LIPID PANEL
Cholesterol: 154 mg/dL (ref 0–200)
HDL: 42.7 mg/dL (ref >39.00)
LDL Cholesterol: 93 mg/dL (ref 0–99)
NonHDL: 110.95
Total CHOL/HDL Ratio: 4
Triglycerides: 89 mg/dL (ref 0.0–149.0)
VLDL: 17.8 mg/dL (ref 0.0–40.0)

## 2018-05-03 LAB — TSH: TSH: 3.14 u[IU]/mL (ref 0.35–4.50)

## 2018-05-09 ENCOUNTER — Other Ambulatory Visit: Payer: Self-pay

## 2018-05-10 ENCOUNTER — Encounter: Payer: Self-pay | Admitting: Family Medicine

## 2018-05-10 ENCOUNTER — Telehealth: Payer: Self-pay

## 2018-05-10 ENCOUNTER — Ambulatory Visit (INDEPENDENT_AMBULATORY_CARE_PROVIDER_SITE_OTHER): Payer: 59 | Admitting: Family Medicine

## 2018-05-10 ENCOUNTER — Other Ambulatory Visit (HOSPITAL_COMMUNITY)
Admission: RE | Admit: 2018-05-10 | Discharge: 2018-05-10 | Disposition: A | Discharge status: Home / Self Care | Payer: 59 | Source: Ambulatory Visit | Attending: Family Medicine | Admitting: Family Medicine

## 2018-05-10 VITALS — BP 104/58 | HR 71 | Temp 98.7°F | Ht 71.0 in | Wt 180.0 lb

## 2018-05-10 DIAGNOSIS — B009 Herpesviral infection, unspecified: Secondary | ICD-10-CM

## 2018-05-10 DIAGNOSIS — J3089 Other allergic rhinitis: Secondary | ICD-10-CM | POA: Diagnosis not present

## 2018-05-10 DIAGNOSIS — Z1239 Encounter for other screening for malignant neoplasm of breast: Secondary | ICD-10-CM

## 2018-05-10 DIAGNOSIS — N898 Other specified noninflammatory disorders of vagina: Secondary | ICD-10-CM | POA: Diagnosis not present

## 2018-05-10 DIAGNOSIS — Z01419 Encounter for gynecological examination (general) (routine) without abnormal findings: Secondary | ICD-10-CM | POA: Insufficient documentation

## 2018-05-10 DIAGNOSIS — Z1231 Encounter for screening mammogram for malignant neoplasm of breast: Secondary | ICD-10-CM

## 2018-05-10 DIAGNOSIS — Z113 Encounter for screening for infections with a predominantly sexual mode of transmission: Secondary | ICD-10-CM | POA: Insufficient documentation

## 2018-05-10 DIAGNOSIS — Z0000 Encounter for general adult medical examination without abnormal findings: Secondary | ICD-10-CM | POA: Diagnosis not present

## 2018-05-10 MED ORDER — DEXLANSOPRAZOLE 60 MG PO CPDR: 1.0000 | DELAYED_RELEASE_CAPSULE | Freq: Every day | ORAL | 3 refills | Status: DC

## 2018-05-10 NOTE — Patient Instructions (Signed)
Great to see you. Please call the breast center at [phone] to schedule your mammogram.  

## 2018-05-10 NOTE — Progress Notes (Signed)
Subjective:   Patient ID: [name], female    DOB: 05/27/1961, 57 y.o.   MRN: [id number]  [name] is a pleasant 57 y.o. year old female who presents to clinic today with Annual Exam (Patient is here today for a CPE with PAP.  She had fasting labs completed on 5.23.19.  She agrees to a mammogram at [facility] if referral is placed.  She is having some vaginal discharge and would like STD testing.)  on 05/10/2018  HPI:  Remote h/o hysterectomy but she is not sure if she has a cervix. Mammogram due.  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  She does want STD testing included in her pap smear today.  Having some vaginal discharge but not malodorous or itchy.  She has no current HSV lesions. Health Maintenance  Topic Date Due  . MAMMOGRAM  02/10/2017  . PAP SMEAR  02/10/2018  . INFLUENZA VACCINE  07/12/2018  . COLONOSCOPY  02/17/2021  . TETANUS/TDAP  01/15/2022  . Hepatitis C Screening  Completed  . HIV Screening  Completed   Lab Results  Component Value Date   CHOL 154 05/03/2018   HDL 42.70 05/03/2018   LDLCALC 93 05/03/2018   TRIG 89.0 05/03/2018   CHOLHDL 4 05/03/2018   Lab Results  Component Value Date   NA 142 05/03/2018   K 4.1 05/03/2018   CL 103 05/03/2018   CO2 32 05/03/2018   Lab Results  Component Value Date   WBC 9.0 05/03/2018   HGB 14.2 05/03/2018   HCT 42.8 05/03/2018   MCV 90.1 05/03/2018   PLT 374.0 05/03/2018   Lab Results  Component Value Date   TSH 3.14 05/03/2018      Current Outpatient Medications on File Prior to Visit  Medication Sig Dispense Refill  . Calcium Carb-Cholecalciferol (CALTRATE 600+D) 600-800 MG-UNIT TABS Take 1 tablet by mouth 2 (two) times daily.    . cycloSPORINE (RESTASIS) 0.05 % ophthalmic emulsion 1 drop 2 (two) times daily.    [name] EPIPEN 2-PAK 0.3 MG/0.3ML SOAJ injection     . FLUoxetine (PROZAC) 20 MG capsule Take 1 capsule (20 mg total) by mouth  daily. 90 capsule 1  . fluticasone (FLONASE) 50 MCG/ACT nasal spray instill 2 sprays into each nostril once daily 16 g 1  . levocetirizine (XYZAL) 5 MG tablet     . magnesium oxide (MAG-OX) 400 MG tablet Take 400 mg by mouth daily.    . montelukast (SINGULAIR) 10 MG tablet     . Multiple Vitamin (MULTIVITAMINS PO) Take 1 tablet by mouth daily.      [name] triamcinolone (KENALOG) 0.025 % cream Apply 1 application topically 2 (two) times daily. 30 g 0   No current facility-administered medications on file prior to visit.     Allergies  Allergen Reactions  . Chlordiazepoxide-Clidinium     "loopy"  . Codeine     REACTION: nausea,vomitting and dizziness  . Penicillins     REACTION: hives ---taken as a child    Past Medical History:  Diagnosis Date  . Allergic rhinitis   . Anxiety   . Back pain   . Contact lens/glasses fitting   . Depression   . Endometriosis   . Fatigue   . GERD (gastroesophageal reflux disease)   . Hiatal hernia   . History of cardiac catheterization 12/1990  . History of Ostium Secundum Atrial Septal Defect 1989  Status post repair in 1989/1990  . Hx of migraines   . IBS (irritable bowel syndrome)   . Incontinence   . Intestinal volvulus [facility]) May 2007  . Loss of appetite   . Onychomycosis   . Varicose veins     Past Surgical History:  Procedure Laterality Date  . ABDOMINAL HYSTERECTOMY  2004  . ASD REPAIR, SECUNDUM  1990   Dr. Servando Snare  . BALLOON SINUPLASTY Left   . EYE SURGERY     muscle reattachment 02/2017  . hysterectomy for severe endometriosis    . NASAL SEPTUM SURGERY     Dr. Georgia Lopes  . OPEN REDUCTION INTERNAL FIXATION (ORIF) DISTAL RADIAL FRACTURE Right 06/20/2017   Procedure: OPEN REDUCTION INTERNAL FIXATION (ORIF) DISTAL RADIAL FRACTURE;  Surgeon: Leanora Cover, MD;  Location: [place];  Service: Orthopedics;  Laterality: Right;  . right ankle surgery  2010  . right leg fracture with surgery     done with ankle surgery  .  TRANSTHORACIC ECHOCARDIOGRAM   January 2012   Normal LV size and function, EF greater than 55%. Mild LA dilation. No intra-atrial shunt with bubble study. Normal pulmonary pressures. Only trace to mild mitral and tricuspid regurgitation.  . varicose laser vein surgery  2009    Family History  Problem Relation Age of Onset  . Diabetes Mother   . Colon cancer Paternal Grandfather   . Heart disease Maternal Grandfather   . Prostate cancer Maternal Grandfather   . Lung cancer Maternal Grandmother        with mets to the brain  . Clotting disorder Maternal Grandmother   . Clotting disorder Sister   . Heart disease Sister     Social History   Socioeconomic History  . Marital status: Divorced    Spouse name: Richard  . Number of children: Not on file  . Years of education: Not on file  . Highest education level: Not on file  Occupational History  . Occupation: Scientist, clinical (histocompatibility and immunogenetics): [employer]  . Financial resource strain: Not on file  . Food insecurity:    Worry: Not on file    Inability: Not on file  . Transportation needs:    Medical: Not on file    Non-medical: Not on file  Tobacco Use  . Smoking status: Former Smoker    Packs/day: 1.00    Years: 10.00    Pack years: 10.00    Types: Cigarettes    Last attempt to quit: 12/12/1988    Years since quitting: 29.4  . Smokeless tobacco: Never Used  Substance and Sexual Activity  . Alcohol use: Yes    Comment: social use/wine  . Drug use: No  . Sexual activity: Not on file  Lifestyle  . Physical activity:    Days per week: Not on file    Minutes per session: Not on file  . Stress: Not on file  Relationships  . Social connections:    Talks on phone: Not on file    Gets together: Not on file    Attends religious service: Not on file    Active member of club or organization: Not on file    Attends meetings of clubs or organizations: Not on file    Relationship status: Not on file  . Intimate  partner violence:    Fear of current or ex partner: Not on file    Emotionally abused: Not on file    Physically  abused: Not on file    Forced sexual activity: Not on file  Other Topics Concern  . Not on file  Social History Narrative   They have 2 children and at least two grandchildren. She does all her activities of daily living. She works as a Midwife for [employer].     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 She does other days of the gym and she is able to at least 60 minutes a day 5 days a week.   She is a former smoker who quit in 1990. This was after smoking a pack a day for 10 years. She drinks social alcohol on occasion.   The PMH, PSH, Social History, Family History, Medications, and allergies have been reviewed in [facility], and have been updated if relevant.   Review of Systems  Constitutional: Negative.   HENT: Negative.   Eyes: Negative.   Respiratory: Negative.   Cardiovascular: Negative.   Gastrointestinal: Negative.   Endocrine: Negative.   Genitourinary: Positive for vaginal discharge. Negative for decreased urine volume, difficulty urinating, dyspareunia, dysuria, enuresis, flank pain, frequency, genital sores, hematuria, pelvic pain, urgency, vaginal bleeding and vaginal pain.  Musculoskeletal: Negative.   Skin: Negative.   Allergic/Immunologic: Negative.   Neurological: Negative.   Psychiatric/Behavioral: Negative.   All other systems reviewed and are negative.      Objective:    BP (!) 104/58 (BP Location: Left Arm, Patient Position: Sitting, Cuff Size: Normal)   Pulse 71   Temp 98.7 F (37.1 C) (Oral)   Ht 5\' 11"  (1.803 m)   Wt 180 lb (81.6 kg)   SpO2 97%   BMI 25.10 kg/m    Physical Exam   General:  Well-developed,well-nourished,in no acute distress; alert,appropriate and cooperative throughout examination Head:  normocephalic and  atraumatic.   Eyes:  vision grossly intact, PERRL Ears:  R ear normal and L ear normal externally, TMs clear bilaterally Nose:  no external deformity.   Mouth:  good dentition.   Neck:  No deformities, masses, or tenderness noted. Breasts:  No mass, nodules, thickening, tenderness, bulging, retraction, inflamation, nipple discharge or skin changes noted.   Lungs:  Normal respiratory effort, chest expands symmetrically. Lungs are clear to auscultation, no crackles or wheezes. Heart:  Normal rate and regular rhythm. S1 and S2 normal without gallop, murmur, click, rub or other extra sounds. Abdomen:  Bowel sounds positive,abdomen soft and non-tender without masses, organomegaly or hernias noted. Rectal:  no external abnormalities.   Genitalia:  Pelvic Exam:        External: normal female genitalia without lesions or masses        Vagina: normal without lesions or masses        Cervix: absent        Adnexa: normal bimanual exam without masses or fullness        Uterus: absent        Pap smear: performed Msk:  No deformity or scoliosis noted of thoracic or lumbar spine.   Extremities:  No clubbing, cyanosis, edema, or deformity noted with normal full range of motion of all joints.   Neurologic:  alert & oriented X3 and gait normal.   Skin:  Intact without suspicious lesions or rashes Cervical Nodes:  No lymphadenopathy noted Axillary Nodes:  No palpable lymphadenopathy Psych:  Cognition and judgment appear intact. Alert and cooperative with normal attention  span and concentration. No apparent delusions, illusions, hallucinations      Assessment & Plan:   Well woman exam with routine gynecological exam - Plan: Cytology - PAP  Screening for breast cancer - Plan: MM Digital Screening  Vaginal discharge - Plan: Cytology - PAP No follow-ups on file.

## 2018-05-10 NOTE — Telephone Encounter (Signed)
Refill for Dexilant/thx dmf

## 2018-05-10 NOTE — Assessment & Plan Note (Signed)
NO current lesions, has had only one outbreak which was treated with antiviral rx.

## 2018-05-10 NOTE — Assessment & Plan Note (Signed)
Reviewed preventive care protocols, scheduled due services, and updated immunizations Discussed nutrition, exercise, diet, and healthy lifestyle.  Orders Placed This Encounter  Procedures  . MM Digital Screening

## 2018-05-14 LAB — CYTOLOGY - PAP
CANDIDA VAGINITIS: NEGATIVE
Chlamydia: NEGATIVE
Diagnosis: NEGATIVE
HPV (WINDOPATH): NOT DETECTED
Neisseria Gonorrhea: NEGATIVE
TRICH (WINDOWPATH): NEGATIVE

## 2018-05-15 DIAGNOSIS — J310 Chronic rhinitis: Secondary | ICD-10-CM | POA: Diagnosis not present

## 2018-05-15 DIAGNOSIS — J320 Chronic maxillary sinusitis: Secondary | ICD-10-CM | POA: Diagnosis not present

## 2018-05-15 LAB — CERVICOVAGINAL ANCILLARY ONLY: Herpes: NEGATIVE

## 2018-05-23 DIAGNOSIS — J3089 Other allergic rhinitis: Secondary | ICD-10-CM | POA: Diagnosis not present

## 2018-05-31 ENCOUNTER — Ambulatory Visit (INDEPENDENT_AMBULATORY_CARE_PROVIDER_SITE_OTHER): Payer: 59 | Admitting: Gastroenterology

## 2018-05-31 ENCOUNTER — Encounter: Payer: Self-pay | Admitting: Gastroenterology

## 2018-05-31 VITALS — BP 108/66 | HR 74 | Ht 71.0 in | Wt 176.5 lb

## 2018-05-31 DIAGNOSIS — K5909 Other constipation: Secondary | ICD-10-CM | POA: Insufficient documentation

## 2018-05-31 DIAGNOSIS — K5904 Chronic idiopathic constipation: Secondary | ICD-10-CM | POA: Insufficient documentation

## 2018-05-31 NOTE — Patient Instructions (Signed)
We have sent the following medications to your pharmacy for you to pick up at your convenience:  Linzess 145 mg daily   Please call the office with an update of your symptoms in the next one week and ask for Koren Shiver, RN, BSN.

## 2018-05-31 NOTE — Progress Notes (Addendum)
05/31/2018 [name] [id number] 1961-05-09   HISTORY OF PRESENT ILLNESS: This is a 57 year old female who is known to Dr. Hilarie Fredrickson for treatment of GERD.  She is here today with complaints of constipation.  She tells me that she has chronic issues with constipation and usually takes Miralax daily.  She says that about 2 weeks ago she had some change in bowel habits and does not seem to be going as well.  Feels like she has has not had a good BM in a couple of weeks.  She does not have any abdominal pain.  Her last colonoscopy was in March 2012 at which time the study was normal.  Recent CBC, CMP, TSH were within normal limits.  She is asking for an alternate to Miralax as she does not feel like it is working as well for her anymore.   Past Medical History:  Diagnosis Date  . Allergic rhinitis   . Anxiety   . Back pain   . Contact lens/glasses fitting   . Depression   . Endometriosis   . Fatigue   . GERD (gastroesophageal reflux disease)   . Hiatal hernia   . History of cardiac catheterization 12/1990  . History of Ostium Secundum Atrial Septal Defect 1989   Status post repair in 1989/1990  . Hx of migraines   . IBS (irritable bowel syndrome)   . Incontinence   . Intestinal volvulus [facility]) May 2007  . Loss of appetite   . Onychomycosis   . Varicose veins    Past Surgical History:  Procedure Laterality Date  . ABDOMINAL HYSTERECTOMY  2004  . ASD REPAIR, SECUNDUM  1990   Dr. Servando Snare  . BALLOON SINUPLASTY Left   . EYE SURGERY     muscle reattachment 02/2017  . hysterectomy for severe endometriosis    . NASAL SEPTUM SURGERY     Dr. Georgia Lopes  . OPEN REDUCTION INTERNAL FIXATION (ORIF) DISTAL RADIAL FRACTURE Right 06/20/2017   Procedure: OPEN REDUCTION INTERNAL FIXATION (ORIF) DISTAL RADIAL FRACTURE;  Surgeon: Leanora Cover, MD;  Location: [place];  Service: Orthopedics;  Laterality: Right;  . right ankle surgery  2010  . right leg fracture with surgery      done with ankle surgery  . TRANSTHORACIC ECHOCARDIOGRAM   January 2012   Normal LV size and function, EF greater than 55%. Mild LA dilation. No intra-atrial shunt with bubble study. Normal pulmonary pressures. Only trace to mild mitral and tricuspid regurgitation.  . varicose laser vein surgery  2009    reports that she quit smoking about 29 years ago. Her smoking use included cigarettes. She has a 10.00 pack-year smoking history. She has never used smokeless tobacco. She reports that she drinks alcohol. She reports that she does not use drugs. family history includes Clotting disorder in her maternal grandmother and sister; Colon cancer in her paternal grandfather; Diabetes in her mother; Heart disease in her maternal grandfather and sister; Lung cancer in her maternal grandmother; Prostate cancer in her maternal grandfather. Allergies  Allergen Reactions  . Chlordiazepoxide-Clidinium     "loopy"  . Codeine     REACTION: nausea,vomitting and dizziness  . Penicillins     REACTION: hives ---taken as a child      Outpatient Encounter Medications as of 05/31/2018  Medication Sig  . Calcium Carb-Cholecalciferol (CALTRATE 600+D) 600-800 MG-UNIT TABS Take 1 tablet by mouth 2 (two) times daily.  . cycloSPORINE (RESTASIS) 0.05 % ophthalmic emulsion 1 drop  2 (two) times daily.  [name] dexlansoprazole (DEXILANT) 60 MG capsule Take 1 capsule (60 mg total) by mouth daily.  Mariane Baumgarten Calcium (STOOL SOFTENER PO) Take by mouth daily.  [name] EPIPEN 2-PAK 0.3 MG/0.3ML SOAJ injection   . FLUoxetine (PROZAC) 20 MG capsule Take 1 capsule (20 mg total) by mouth daily.  . fluticasone (FLONASE) 50 MCG/ACT nasal spray instill 2 sprays into each nostril once daily  . levocetirizine (XYZAL) 5 MG tablet   . magnesium oxide (MAG-OX) 400 MG tablet Take 400 mg by mouth daily.  . montelukast (SINGULAIR) 10 MG tablet   . Multiple Vitamin (MULTIVITAMINS PO) Take 1 tablet by mouth daily.    . polyethylene glycol (MIRALAX /  GLYCOLAX) packet Take 17 g by mouth daily.  . [DISCONTINUED] triamcinolone (KENALOG) 0.025 % cream Apply 1 application topically 2 (two) times daily.   No facility-administered encounter medications on file as of 05/31/2018.      REVIEW OF SYSTEMS  : All other systems reviewed and negative except where noted in the History of Present Illness.   PHYSICAL EXAM: BP 108/66   Pulse 74   Ht 5\' 11"  (1.803 m)   Wt 176 lb 8 oz (80.1 kg)   BMI 24.62 kg/m  General: Well developed white female in no acute distress Head: Normocephalic and atraumatic Eyes:  Sclerae anicteric, conjunctiva pink. Ears: Normal auditory acuity Lungs: Clear throughout to auscultation; no increased WOB. Heart: Regular rate and rhythm; no M/R/G. Abdomen: Soft, non-distended.  BS present.  Non-tender. Musculoskeletal: Symmetrical with no gross deformities  Skin: No lesions on visible extremities Extremities: No edema  Neurological: Alert oriented x 4, grossly non-focal Psychological:  Alert and cooperative. Normal mood and affect  ASSESSMENT AND PLAN: *Chronic constipation:  Ongoing issue, worsened somewhat over the past 2 weeks.  Asking to try something other than Miralax.  Will give samples of Linzess 145 mcg to take daily.  I have asked her to call back in a week and give [place] an update on her symptoms.  If it is working then can send a prescription for the medication.   CC:  Lucille Passy, MD   Addendum: Reviewed and agree with initial management. Pyrtle, Lajuan Lines, MD

## 2018-06-01 DIAGNOSIS — J3089 Other allergic rhinitis: Secondary | ICD-10-CM | POA: Diagnosis not present

## 2018-06-06 ENCOUNTER — Telehealth: Payer: Self-pay | Admitting: Gastroenterology

## 2018-06-06 MED ORDER — LINACLOTIDE 145 MCG PO CAPS: 145.0000 ug | ORAL_CAPSULE | Freq: Every day | ORAL | 6 refills | Status: DC

## 2018-06-06 NOTE — Telephone Encounter (Signed)
The prescription has been sent to the patient pharmacy on file.

## 2018-06-06 NOTE — Telephone Encounter (Signed)
The pt has been taking Linzess 145 mcg daily along with cap of miralax daily.  She has noticed that her bowel movements have improved, one solid BM daily. If you want her on the regimen long term she needs a prescription for linzess 145 mcg.  Please advise

## 2018-06-06 NOTE — Telephone Encounter (Signed)
Pt would like to speak with you regarding one of her medications.

## 2018-06-06 NOTE — Telephone Encounter (Signed)
Go ahead and send a prescription.  Thank you!

## 2018-06-07 DIAGNOSIS — J3089 Other allergic rhinitis: Secondary | ICD-10-CM | POA: Diagnosis not present

## 2018-06-08 ENCOUNTER — Telehealth: Payer: Self-pay | Admitting: Gastroenterology

## 2018-06-08 NOTE — Telephone Encounter (Signed)
Spoke to patient and informed her I did receive prior auth and will work on that today in the meantime I put samples up front for her to pick up since she will be out of town next week.

## 2018-06-08 NOTE — Telephone Encounter (Signed)
Prior authorization was approved. File DX:[id number] until 06-09-2019

## 2018-06-20 DIAGNOSIS — J3089 Other allergic rhinitis: Secondary | ICD-10-CM | POA: Diagnosis not present

## 2018-06-29 DIAGNOSIS — H1045 Other chronic allergic conjunctivitis: Secondary | ICD-10-CM | POA: Diagnosis not present

## 2018-06-29 DIAGNOSIS — J3089 Other allergic rhinitis: Secondary | ICD-10-CM | POA: Diagnosis not present

## 2018-06-29 DIAGNOSIS — J019 Acute sinusitis, unspecified: Secondary | ICD-10-CM | POA: Diagnosis not present

## 2018-06-29 DIAGNOSIS — R05 Cough: Secondary | ICD-10-CM | POA: Diagnosis not present

## 2018-07-02 ENCOUNTER — Ambulatory Visit
Admission: RE | Admit: 2018-07-02 | Discharge: 2018-07-02 | Disposition: A | Discharge status: Home / Self Care | Payer: 59 | Source: Ambulatory Visit | Attending: Family Medicine | Admitting: Family Medicine

## 2018-07-02 DIAGNOSIS — Z1231 Encounter for screening mammogram for malignant neoplasm of breast: Secondary | ICD-10-CM | POA: Diagnosis not present

## 2018-07-02 DIAGNOSIS — Z1239 Encounter for other screening for malignant neoplasm of breast: Secondary | ICD-10-CM

## 2018-07-03 ENCOUNTER — Encounter: Payer: Self-pay | Admitting: Family Medicine

## 2018-07-09 DIAGNOSIS — J3089 Other allergic rhinitis: Secondary | ICD-10-CM | POA: Diagnosis not present

## 2018-07-11 DIAGNOSIS — J320 Chronic maxillary sinusitis: Secondary | ICD-10-CM | POA: Diagnosis not present

## 2018-07-11 DIAGNOSIS — J310 Chronic rhinitis: Secondary | ICD-10-CM | POA: Diagnosis not present

## 2018-07-13 DIAGNOSIS — J3089 Other allergic rhinitis: Secondary | ICD-10-CM | POA: Diagnosis not present

## 2018-07-16 ENCOUNTER — Ambulatory Visit (HOSPITAL_COMMUNITY)
Admission: EM | Admit: 2018-07-16 | Discharge: 2018-07-16 | Disposition: A | Discharge status: Home / Self Care | Payer: 59 | Attending: Family Medicine | Admitting: Family Medicine

## 2018-07-16 ENCOUNTER — Ambulatory Visit: Payer: Self-pay | Admitting: Family Medicine

## 2018-07-16 ENCOUNTER — Encounter (HOSPITAL_COMMUNITY): Payer: Self-pay | Admitting: Emergency Medicine

## 2018-07-16 DIAGNOSIS — Z880 Allergy status to penicillin: Secondary | ICD-10-CM | POA: Insufficient documentation

## 2018-07-16 DIAGNOSIS — Z87891 Personal history of nicotine dependence: Secondary | ICD-10-CM | POA: Diagnosis not present

## 2018-07-16 DIAGNOSIS — Z885 Allergy status to narcotic agent status: Secondary | ICD-10-CM | POA: Diagnosis not present

## 2018-07-16 DIAGNOSIS — R1031 Right lower quadrant pain: Secondary | ICD-10-CM

## 2018-07-16 DIAGNOSIS — K219 Gastro-esophageal reflux disease without esophagitis: Secondary | ICD-10-CM | POA: Insufficient documentation

## 2018-07-16 DIAGNOSIS — K449 Diaphragmatic hernia without obstruction or gangrene: Secondary | ICD-10-CM | POA: Diagnosis not present

## 2018-07-16 DIAGNOSIS — R110 Nausea: Secondary | ICD-10-CM

## 2018-07-16 DIAGNOSIS — R197 Diarrhea, unspecified: Secondary | ICD-10-CM

## 2018-07-16 DIAGNOSIS — F329 Major depressive disorder, single episode, unspecified: Secondary | ICD-10-CM | POA: Diagnosis not present

## 2018-07-16 DIAGNOSIS — F419 Anxiety disorder, unspecified: Secondary | ICD-10-CM | POA: Diagnosis not present

## 2018-07-16 DIAGNOSIS — K580 Irritable bowel syndrome with diarrhea: Secondary | ICD-10-CM | POA: Diagnosis not present

## 2018-07-16 LAB — COMPREHENSIVE METABOLIC PANEL
ALK PHOS: 70 U/L (ref 38–126)
ALT: 21 U/L (ref 0–44)
AST: 25 U/L (ref 15–41)
Albumin: 3.7 g/dL (ref 3.5–5.0)
Anion gap: 10 (ref 5–15)
BUN: 11 mg/dL (ref 6–20)
CALCIUM: 9.3 mg/dL (ref 8.9–10.3)
CHLORIDE: 103 mmol/L (ref 98–111)
CO2: 27 mmol/L (ref 22–32)
CREATININE: 0.78 mg/dL (ref 0.44–1.00)
GFR calc Af Amer: >60 mL/min (ref >60)
Glucose, Bld: 96 mg/dL (ref 70–99)
Potassium: 3.6 mmol/L (ref 3.5–5.1)
Sodium: 140 mmol/L (ref 135–145)
Total Bilirubin: 1.1 mg/dL (ref 0.3–1.2)
Total Protein: 6.5 g/dL (ref 6.5–8.1)

## 2018-07-16 LAB — CBC WITH DIFFERENTIAL/PLATELET
ABS IMMATURE GRANULOCYTES: 0 10*3/uL (ref 0.0–0.1)
BASOS PCT: 0 %
Basophils Absolute: 0 10*3/uL (ref 0.0–0.1)
Eosinophils Absolute: 0 10*3/uL (ref 0.0–0.7)
Eosinophils Relative: 0 %
HCT: 46.9 % — ABNORMAL HIGH (ref 36.0–46.0)
HEMOGLOBIN: 15.1 g/dL — AB (ref 12.0–15.0)
IMMATURE GRANULOCYTES: 0 %
LYMPHS ABS: 2.3 10*3/uL (ref 0.7–4.0)
LYMPHS PCT: 25 %
MCH: 30 pg (ref 26.0–34.0)
MCHC: 32.2 g/dL (ref 30.0–36.0)
MCV: 93.2 fL (ref 78.0–100.0)
MONO ABS: 0.8 10*3/uL (ref 0.1–1.0)
MONOS PCT: 9 %
NEUTROS ABS: 5.9 10*3/uL (ref 1.7–7.7)
NEUTROS PCT: 66 %
PLATELETS: 268 10*3/uL (ref 150–400)
RBC: 5.03 MIL/uL (ref 3.87–5.11)
RDW: 13.3 % (ref 11.5–15.5)
WBC: 9.1 10*3/uL (ref 4.0–10.5)

## 2018-07-16 LAB — POCT URINALYSIS DIP (DEVICE)
Bilirubin Urine: NEGATIVE
Glucose, UA: NEGATIVE mg/dL
KETONES UR: NEGATIVE mg/dL
Leukocytes, UA: NEGATIVE
Nitrite: NEGATIVE
PH: 6 (ref 5.0–8.0)
Protein, ur: NEGATIVE mg/dL
Specific Gravity, Urine: 1.01 (ref 1.005–1.030)
Urobilinogen, UA: 0.2 mg/dL (ref 0.0–1.0)

## 2018-07-16 LAB — AMYLASE: AMYLASE: 55 U/L (ref 28–100)

## 2018-07-16 LAB — LIPASE, BLOOD: Lipase: 30 U/L (ref 11–51)

## 2018-07-16 MED ORDER — ONDANSETRON 4 MG PO TBDP: 4.0000 mg | ORAL_TABLET | Freq: Three times a day (TID) | ORAL | 0 refills | Status: DC | PRN

## 2018-07-16 NOTE — ED Triage Notes (Signed)
Nausea as well.

## 2018-07-16 NOTE — ED Provider Notes (Signed)
[place]   [id number] 07/16/18 Arrival Time: 2831  ASSESSMENT & PLAN:  1. Right lower quadrant abdominal pain   2. Diarrhea, unspecified type   3. Nausea without vomiting     Meds ordered this encounter  Medications  . ondansetron (ZOFRAN-ODT) 4 MG disintegrating tablet    Sig: Take 1 tablet (4 mg total) by mouth every 8 (eight) hours as needed for nausea or vomiting.    Dispense:  15 tablet    Refill:  0   Pending: Labs Reviewed  CBC WITH DIFFERENTIAL/PLATELET  COMPREHENSIVE METABOLIC PANEL  AMYLASE  LIPASE, BLOOD U/A UPT     Discharge Instructions     You have been seen today for abdominal pain. Your evaluation was not suggestive of any emergent condition requiring medical intervention at this time. We are awaiting though awaiting the result of the blood work drawn here. Keep in mind that some abdominal problems make take more time to appear. Therefore, it is very important for you to watch for any new symptoms or worsening of your current condition. Please proceed directly to the Emergency Department immediately should you feel worse in any way or have any of the following symptoms: increasing or different abdominal pain, persistent vomiting, fevers, or shaking chills.     Further follow-up plans will be based on outcome of lab/imaging studies; see orders.  Reviewed expectations re: course of current medical issues. Questions answered. Outlined signs and symptoms indicating need for more acute intervention. Patient verbalized understanding. After Visit Summary given.   SUBJECTIVE:  [name] is a 57 y.o. female who presents with complaint of intermittent non-bloody diarrhea. Abrupt onset yesterday am. Imodium helped; has not continued to take. Normal frequency of bowel movements before current symptoms started. Reports gradual onset of RLQ abdominal discomfort first noticed yesterday am, intermittent overnight, and more prominent and persistent  this am. Called her doctors office and was instructed to come here for evaluation. Mild nausea without emesis. Decreased appetite and PO intake. Mild lower back discomfort yesterday am but this resolved and has not returned. Ambulatory without difficulty. No specific aggravating or alleviating factors reported. No urinary symptoms. No recent travel or sick contacts.  No LMP recorded. Patient has had a hysterectomy. Ovaries remaining. Thinks she still has her appendix.  Past Medical History:  Diagnosis Date  . Allergic rhinitis   . Anxiety   . Back pain   . Contact lens/glasses fitting   . Depression   . Endometriosis   . Fatigue   . GERD (gastroesophageal reflux disease)   . Hiatal hernia   . History of cardiac catheterization 12/1990  . History of Ostium Secundum Atrial Septal Defect 1989   Status post repair in 1989/1990  . Hx of migraines   . IBS (irritable bowel syndrome)   . Incontinence   . Intestinal volvulus [facility]) May 2007  . Loss of appetite   . Onychomycosis   . Varicose veins      Past Surgical History:  Procedure Laterality Date  . ABDOMINAL HYSTERECTOMY  2004  . ASD REPAIR, SECUNDUM  1990   Dr. Servando Snare  . BALLOON SINUPLASTY Left   . EYE SURGERY     muscle reattachment 02/2017  . hysterectomy for severe endometriosis    . NASAL SEPTUM SURGERY     Dr. Georgia Lopes  . OPEN REDUCTION INTERNAL FIXATION (ORIF) DISTAL RADIAL FRACTURE Right 06/20/2017   Procedure: OPEN REDUCTION INTERNAL FIXATION (ORIF) DISTAL RADIAL FRACTURE;  Surgeon: Leanora Cover, MD;  Location: [place];  Service: Orthopedics;  Laterality: Right;  . right ankle surgery  2010  . right leg fracture with surgery     done with ankle surgery  . TRANSTHORACIC ECHOCARDIOGRAM   January 2012   Normal LV size and function, EF greater than 55%. Mild LA dilation. No intra-atrial shunt with bubble study. Normal pulmonary pressures. Only trace to mild mitral and tricuspid regurgitation.  .  varicose laser vein surgery  2009    ROS: As per HPI. All other systems negative.   OBJECTIVE:  Vitals:   07/16/18 0910 07/16/18 0911  BP: 103/76   Pulse: 76   Resp: 16   Temp: 98.5 F (36.9 C)   SpO2: 100%   Weight:  174 lb (78.9 kg)    General appearance: alert; no distress Lungs: clear to auscultation bilaterally Heart: regular rate and rhythm Abdomen: soft; non-distended; mild tenderness over her RLQ without guarding or rebound tenderness; bowel sounds present; no masses or organomegaly Back: no CVA tenderness; FROM at hips Extremities: no edema; symmetrical with no gross deformities Skin: warm and dry Neurologic: normal gait Psychological: alert and cooperative; normal mood and affect   Allergies  Allergen Reactions  . Chlordiazepoxide-Clidinium     "loopy"  . Codeine     REACTION: nausea,vomitting and dizziness  . Penicillins     REACTION: hives ---taken as a child                                               Past Medical History:  Diagnosis Date  . Allergic rhinitis   . Anxiety   . Back pain   . Contact lens/glasses fitting   . Depression   . Endometriosis   . Fatigue   . GERD (gastroesophageal reflux disease)   . Hiatal hernia   . History of cardiac catheterization 12/1990  . History of Ostium Secundum Atrial Septal Defect 1989   Status post repair in 1989/1990  . Hx of migraines   . IBS (irritable bowel syndrome)   . Incontinence   . Intestinal volvulus [facility]) May 2007  . Loss of appetite   . Onychomycosis   . Varicose veins    Social History   Socioeconomic History  . Marital status: Divorced    Spouse name: Richard  . Number of children: Not on file  . Years of education: Not on file  . Highest education level: Not on file  Occupational History  . Occupation: Scientist, clinical (histocompatibility and immunogenetics): [employer]  . Financial resource strain: Not on file  . Food insecurity:    Worry: Not on file    Inability: Not on file  .  Transportation needs:    Medical: Not on file    Non-medical: Not on file  Tobacco Use  . Smoking status: Former Smoker    Packs/day: 1.00    Years: 10.00    Pack years: 10.00    Types: Cigarettes    Last attempt to quit: 12/12/1988    Years since quitting: 29.6  . Smokeless tobacco: Never Used  Substance and Sexual Activity  . Alcohol use: Yes    Comment: social use/wine  . Drug use: No  . Sexual activity: Not on file  Lifestyle  . Physical activity:    Days per week: Not on file  Minutes per session: Not on file  . Stress: Not on file  Relationships  . Social connections:    Talks on phone: Not on file    Gets together: Not on file    Attends religious service: Not on file    Active member of club or organization: Not on file    Attends meetings of clubs or organizations: Not on file    Relationship status: Not on file  . Intimate partner violence:    Fear of current or ex partner: Not on file    Emotionally abused: Not on file    Physically abused: Not on file    Forced sexual activity: Not on file  Other Topics Concern  . Not on file  Social History Narrative   They have 2 children and at least two grandchildren. She does all her activities of daily living. She works as a Midwife for [employer].     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 She does other days of the gym and she is able to at least 60 minutes a day 5 days a week.   She is a former smoker who quit in 1990. This was after smoking a pack a day for 10 years. She drinks social alcohol on occasion.   Family History  Problem Relation Age of Onset  . Diabetes Mother   . Colon cancer Paternal Grandfather   . Heart disease Maternal Grandfather   . Prostate cancer Maternal Grandfather   . Lung cancer Maternal Grandmother        with mets to the brain  . Clotting disorder Maternal Grandmother   .  Clotting disorder Sister   . Heart disease Sister      Vanessa Kick, MD 07/16/18 1034

## 2018-07-16 NOTE — Telephone Encounter (Signed)
Pt c/o watery diarrhea, nausea, and constant abdominal pain. Pt describes pain as dull and sharp. Pt's abdominal pain increases in intensity then decreased in intensity but never goes away. Pt's pain started Saturday evening and is located to the front right lower pelvic region that shoots through to the right lower back.  Pain is moderate. Pt is also having nausea but is able to tolerate water only. Pt stated that she is having watery diarrhea. Pt has been incontinent of stool  four times. Pt is concerned it is her appendix. Pt is afebrile at time of call but stated she has had occasional chills. Pt stated that pain decreases in intensity after she has diarrhea. Pt stated that she took antibiotics (Z Pack) 3 weeks ago.   Care advice given per protocol. Pt advised to go to [facility]. Pt plans to go to [facility].  Reason for Disposition . [1] MILD-MODERATE pain AND [2] constant AND [3] present > 2 hours . [1] Constant abdominal pain AND [2] present > 2 hours  Additional Information . Negative: [1] SEVERE diarrhea (e.g., 7 or more times / day more than normal) AND [2] present > 24 hours (1 day)    Pt having abdominal pain as severe diarrhea.  Answer Assessment - Initial Assessment Questions 1. DIARRHEA SEVERITY: "How bad is the diarrhea?" "How many extra stools have you had in the past 24 hours than normal?"    - NO DIARRHEA (SCALE 0)   - MILD (SCALE 1-3): Few loose or mushy BMs; increase of 1-3 stools over normal daily number of stools; mild increase in ostomy output.   -  MODERATE (SCALE 4-7): Increase of 4-6 stools daily over normal; moderate increase in ostomy output. * SEVERE (SCALE 8-10; OR 'WORST POSSIBLE'): Increase of 7 or more stools daily over normal; moderate increase in ostomy output; incontinence.     severe 2. ONSET: "When did the diarrhea begin?"      Sunday 3. BM CONSISTENCY: "How loose or watery is the diarrhea?"      watery 4. VOMITING: "Are you also vomiting?" If so,  ask: "How many times in the past 24 hours?"      No vomiting but is having nausea 5. ABDOMINAL PAIN: "Are you having any abdominal pain?" If yes: "What does it feel like?" (e.g., crampy, dull, intermittent, constant)      Yes -Moderate 6. ABDOMINAL PAIN SEVERITY: If present, ask: "How bad is the pain?"  (e.g., Scale 1-10; mild, moderate, or severe)   - MILD (1-3): doesn't interfere with normal activities, abdomen soft and not tender to touch    - MODERATE (4-7): interferes with normal activities or awakens from sleep, tender to touch    - SEVERE (8-10): excruciating pain, doubled over, unable to do any normal activities       moderate 7. ORAL INTAKE: If vomiting, "Have you been able to drink liquids?" "How much fluids have you had in the past 24 hours?"     No- drinking water 8. HYDRATION: "Any signs of dehydration?" (e.g., dry mouth [not just dry lips], too weak to stand, dizziness, new weight loss) "When did you last urinate?"     Dry mouth, lost 3 lbs since symptoms started 9. EXPOSURE: "Have you traveled to a foreign country recently?" "Have you been exposed to anyone with diarrhea?" "Could you have eaten any food that was spoiled?"     No-no-no 10. ANTIBIOTIC USE: "Are you taking antibiotics now or have you taken antibiotics  in the past 2 months?"       Took Z pack 3 weeks ago 11. OTHER SYMPTOMS: "Do you have any other symptoms?" (e.g., fever, blood in stool)       Occasional cold chills - 98.4  12. PREGNANCY: "Is there any chance you are pregnant?" "When was your last menstrual period?"       n/a  Answer Assessment - Initial Assessment Questions 1. LOCATION: "Where does it hurt?"      Front lower pelvis region above hip bone beside straight through to back below navel on the right side  2. RADIATION: "Does the pain shoot anywhere else?" (e.g., chest, back)     Back  3. ONSET: "When did the pain begin?" (e.g., minutes, hours or days ago)     Saturday evening 4. SUDDEN: "Gradual or  sudden onset?"     sudden 5. PATTERN "Does the pain come and go, or is it constant?"    - If constant: "Is it getting better, staying the same, or worsening?"      (Note: Constant means the pain never goes away completely; most serious pain is constant and it progresses)     - If intermittent: "How long does it last?" "Do you have pain now?"     (Note: Intermittent means the pain goes away completely between bouts)  constant- lasts 1 hour then will have dull pain now 6. SEVERITY: "How bad is the pain?"  (e.g., Scale 1-10; mild, moderate, or severe)   - MILD (1-3): doesn't interfere with normal activities, abdomen soft and not tender to touch    - MODERATE (4-7): interferes with normal activities or awakens from sleep, tender to touch    - SEVERE (8-10): excruciating pain, doubled over, unable to do any normal activities      moderate 7. RECURRENT SYMPTOM: "Have you ever had this type of abdominal pain before?" If so, ask: "When was the last time?" and "What happened that time?"      no 8. CAUSE: "What do you think is causing the abdominal pain?"     Concerned it is appendix 9. RELIEVING/AGGRAVATING FACTORS: "What makes it better or worse?" (e.g., movement, antacids, bowel movement)     After she goes to bathroom helps tries to sleep  10. OTHER SYMPTOMS: "Has there been any vomiting, diarrhea, constipation, or urine problems?"       Diarrhea watery pt has been incontinent four times, nausea 11. PREGNANCY: "Is there any chance you are pregnant?" "When was your last menstrual period?"      N/a menopause  Protocols used: ABDOMINAL PAIN - FEMALE-A-AH, DIARRHEA-A-AH

## 2018-07-16 NOTE — ED Triage Notes (Signed)
PT reports RLQ pain that started yesterday morning with diarrhea that improved throughout the day. Pain is worse today with 8 episodes of diarrhea this AM.

## 2018-07-16 NOTE — Discharge Instructions (Addendum)
You have been seen today for abdominal pain. Your evaluation was not suggestive of any emergent condition requiring medical intervention at this time. We are awaiting though awaiting the result of the blood work drawn here. Keep in mind that some abdominal problems make take more time to appear. Therefore, it is very important for you to watch for any new symptoms or worsening of your current condition. Please proceed directly to the Emergency Department immediately should you feel worse in any way or have any of the following symptoms: increasing or different abdominal pain, persistent vomiting, fevers, or shaking chills.

## 2018-07-16 NOTE — Telephone Encounter (Signed)
Noted. Thanks.

## 2018-07-18 DIAGNOSIS — J3089 Other allergic rhinitis: Secondary | ICD-10-CM | POA: Diagnosis not present

## 2018-07-20 DIAGNOSIS — J3089 Other allergic rhinitis: Secondary | ICD-10-CM | POA: Diagnosis not present

## 2018-07-24 DIAGNOSIS — J3089 Other allergic rhinitis: Secondary | ICD-10-CM | POA: Diagnosis not present

## 2018-08-02 DIAGNOSIS — J3089 Other allergic rhinitis: Secondary | ICD-10-CM | POA: Diagnosis not present

## 2018-08-08 DIAGNOSIS — J3089 Other allergic rhinitis: Secondary | ICD-10-CM | POA: Diagnosis not present

## 2018-08-16 DIAGNOSIS — J3089 Other allergic rhinitis: Secondary | ICD-10-CM | POA: Diagnosis not present

## 2018-08-17 ENCOUNTER — Ambulatory Visit: Payer: 59 | Admitting: Nurse Practitioner

## 2018-08-17 ENCOUNTER — Ambulatory Visit: Payer: Self-pay | Admitting: Family Medicine

## 2018-08-17 ENCOUNTER — Encounter: Payer: Self-pay | Admitting: Nurse Practitioner

## 2018-08-17 VITALS — BP 104/60 | HR 54 | Temp 98.7°F | Ht 71.0 in | Wt 177.6 lb

## 2018-08-17 DIAGNOSIS — B009 Herpesviral infection, unspecified: Secondary | ICD-10-CM

## 2018-08-17 MED ORDER — VALACYCLOVIR HCL 1 G PO TABS: 1000.0000 mg | ORAL_TABLET | Freq: Three times a day (TID) | ORAL | 0 refills | Status: DC

## 2018-08-17 NOTE — Patient Instructions (Signed)
You will be contacted with results. Will refer to dermatology if lab is negative  Herpes Simplex Test There are two common types of herpes simplex virus (HSV). These are classified as Type 1 (HSV1) or Type 2 (HSV2). Type 1 often causes cold sores on or around the mouth and sometimes on or around the eyes. Type 2 is commonly known as a sexually transmitted infection that causes sores in and around the genitals. Both types of herpes simplex can cause sores in different areas. There are two types of herpes simplex tests. These include:  Culture. This consists of collecting and testing a sample of fluid with a cotton swab from an open sore. This can only be done during an active infection (outbreak). Culture tests take several days to complete but are very accurate.  HSV blood tests. This test is not as accurate as a culture. However, HSV blood tests are faster than cultures, often providing a test result within one day. ? HSV antibody test. This checks for the presence of antibodies against HSV in your blood. Antibodies are proteins your body makes to help fight infection. ? HSV antigen test. This checks for the presence of the HSV germ (antigen) in your blood.  Your health care provider may recommend you have a HSV test if:  He or she believes you have a HSV infection.  You have a weakened immune system (immunocompromised) and you have sores around your mouth or genitals that look like HSV eruptions.  You have a fever of unknown origin (FUO).  You are pregnant, have herpes, and are expecting to deliver a baby vaginally in the next 6-8 weeks.  What do the results mean? It is your responsibility to obtain your test results. Ask the lab or department performing the test when and how you will get your results. Contact your health care provider to discuss any questions you have about your results. Range of Normal Values Ranges for normal values may vary among different labs and hospitals. You  should always check with your health care provider after having lab work or other tests done to discuss whether your values are considered within normal limits. Normal findings include:  No HSV antigen or antibodies present in your blood.  No HSV antigen present in cultured fluid.  Meaning of Results Outside Normal Ranges The following test results may indicate that you have an active HSV infection:  Positive culture for HSV1 or HSV2.  Presence of HSV1 or HSV2 antigens in your blood.  Presence of certain HSV1 or HSV2 antibodies (IgM) in your blood.  Discuss your test results with your health care provider. He or she will use the results to make a diagnosis and determine a treatment plan that is right for you. Talk with your health care provider to discuss your results, treatment options, and if necessary, the need for more tests. Talk with your health care provider if you have any questions about your results. This information is not intended to replace advice given to you by your health care provider. Make sure you discuss any questions you have with your health care provider. Document Released: 12/31/2004 Document Revised: 08/03/2016 Document Reviewed: 04/15/2014 Elsevier Interactive Patient Education  2018 [employer].

## 2018-08-17 NOTE — Progress Notes (Signed)
Subjective:  Patient ID: [name], female    DOB: 1961-08-01  Age: 57 y.o. MRN: [id number]  CC: Rash (Rash started last summer after #2 shingles shot, it seems to come and go and has been tested for STD and been negative. Today a rash, bump has presented on buttocks area. Mildly painful.)  Rash  This is a recurrent problem. The current episode started more than 1 month ago. The problem has been waxing and waning since onset. The affected locations include the left buttock. The rash is characterized by burning, blistering, pain, redness and itchiness. It is unknown if there was an exposure to a precipitant. Pertinent negatives include no fever or joint pain. Past treatments include nothing. Her past medical history is significant for varicella.   Reviewed past Medical, Social and Family history today.  Outpatient Medications Prior to Visit  Medication Sig Dispense Refill  . Calcium Carb-Cholecalciferol (CALTRATE 600+D) 600-800 MG-UNIT TABS Take 1 tablet by mouth 2 (two) times daily.    . cycloSPORINE (RESTASIS) 0.05 % ophthalmic emulsion 1 drop 2 (two) times daily.    [name] dexlansoprazole (DEXILANT) 60 MG capsule Take 1 capsule (60 mg total) by mouth daily. 90 capsule 3  . Docusate Calcium (STOOL SOFTENER PO) Take by mouth daily.    [name] EPIPEN 2-PAK 0.3 MG/0.3ML SOAJ injection     . FLUoxetine (PROZAC) 10 MG capsule Prozac    . FLUoxetine (PROZAC) 20 MG capsule Take 1 capsule (20 mg total) by mouth daily. 90 capsule 1  . fluticasone (FLONASE) 50 MCG/ACT nasal spray instill 2 sprays into each nostril once daily 16 g 1  . levocetirizine (XYZAL) 5 MG tablet     . magnesium oxide (MAG-OX) 400 MG tablet Take 400 mg by mouth daily.    . montelukast (SINGULAIR) 10 MG tablet     . Multiple Vitamin (MULTIVITAMINS PO) Take 1 tablet by mouth daily.      . ondansetron (ZOFRAN-ODT) 4 MG disintegrating tablet Take 1 tablet (4 mg total) by mouth every 8 (eight) hours as needed for nausea or vomiting.  15 tablet 0  . polyethylene glycol (MIRALAX / GLYCOLAX) packet Take 17 g by mouth daily.    [name] linaclotide (LINZESS) 145 MCG CAPS capsule Take 1 capsule (145 mcg total) by mouth daily before breakfast. 30 capsule 6   No facility-administered medications prior to visit.     ROS See HPI  Objective:  BP 104/60 (BP Location: Left Arm, Patient Position: Sitting, Cuff Size: Normal)   Pulse (!) 54   Temp 98.7 F (37.1 C) (Oral)   Ht 5\' 11"  (1.803 m)   Wt 177 lb 9.6 oz (80.6 kg)   SpO2 96%   BMI 24.77 kg/m   BP Readings from Last 3 Encounters:  08/17/18 104/60  07/16/18 103/76  05/31/18 108/66    Wt Readings from Last 3 Encounters:  08/17/18 177 lb 9.6 oz (80.6 kg)  07/16/18 174 lb (78.9 kg)  05/31/18 176 lb 8 oz (80.1 kg)    Physical Exam  Constitutional: She appears well-developed and well-nourished.  Skin: Rash noted. Rash is vesicular. There is erythema.     Viral swab obtained  Vitals reviewed.   Lab Results  Component Value Date   WBC 9.1 07/16/2018   HGB 15.1 (H) 07/16/2018   HCT 46.9 (H) 07/16/2018   PLT 268 07/16/2018   GLUCOSE 96 07/16/2018   CHOL 154 05/03/2018   TRIG 89.0 05/03/2018   HDL 42.70 05/03/2018  LDLCALC 93 05/03/2018   ALT 21 07/16/2018   AST 25 07/16/2018   NA 140 07/16/2018   K 3.6 07/16/2018   CL 103 07/16/2018   CREATININE 0.78 07/16/2018   BUN 11 07/16/2018   CO2 27 07/16/2018   TSH 3.14 05/03/2018    No results found.  Assessment & Plan:   [name] was seen today for rash.  Diagnoses and all orders for this visit:  Herpetic lesion -     valACYclovir (VALTREX) 1000 MG tablet; Take 1 tablet (1,000 mg total) by mouth 3 (three) times daily. -     Herpes culture, rapid   I am having [name]. Candace "Barbie" start on valACYclovir. I am also having her maintain her Multiple Vitamin (MULTIVITAMINS PO), Calcium Carb-Cholecalciferol, magnesium oxide, levocetirizine, montelukast, EPIPEN 2-PAK, cycloSPORINE, fluticasone,  FLUoxetine, dexlansoprazole, polyethylene glycol, Docusate Calcium (STOOL SOFTENER PO), linaclotide, ondansetron, and FLUoxetine.  Meds ordered this encounter  Medications  . valACYclovir (VALTREX) 1000 MG tablet    Sig: Take 1 tablet (1,000 mg total) by mouth 3 (three) times daily.    Dispense:  21 tablet    Refill:  0    Order Specific Question:   Supervising Provider    Answer:   MATTHEWS, CODY [[id number]]    Follow-up: No follow-ups on file.  Wilfred Lacy, NP

## 2018-08-17 NOTE — Telephone Encounter (Signed)
Pt thinks she may have a diaper rash. She has had this rash before, and was checked for STD, but that was negative. Pt wants to know what she can put on it if it is a diaper rash. Or should she be seen? Dr Deborra Medina is not in the office today, who is her dr.  Luiz Iron for Disposition . Genital area rash    Patient has bump in rectal area- painful  Answer Assessment - Initial Assessment Questions 1. APPEARANCE of RASH: "Describe the rash."      Almost like "whelp" at rectum 2. LOCATION: "Where is the rash located?"      Skin surface in rectal area 3. NUMBER: "How many spots are there?"      1  area 4. SIZE: "How big are the spots?" (Inches, centimeters or compare to size of a coin)      1/4- 1/2 inch 5. ONSET: "When did the rash start?"      Yesterday morning 6. ITCHING: "Does the rash itch?" If so, ask: "How bad is the itch?"  (Scale 1-10; or mild, moderate, severe)     No itching- tender only 7. PAIN: "Does the rash hurt?" If so, ask: "How bad is the pain?"  (Scale 1-10; or mild, moderate, severe)     Yes- tender 8. OTHER SYMPTOMS: "Do you have any other symptoms?" (e.g., fever)     Fullness - needs to have BM 9. PREGNANCY: "Is there any chance you are pregnant?" "When was your last menstrual period?"     n/a  Protocols used: RASH OR REDNESS - LOCALIZED-A-AH

## 2018-08-22 LAB — HERPES CULTURE, RAPID
MICRO NUMBER:: 91068212
SPECIMEN QUALITY: ADEQUATE

## 2018-08-24 DIAGNOSIS — J3089 Other allergic rhinitis: Secondary | ICD-10-CM | POA: Diagnosis not present

## 2018-08-28 ENCOUNTER — Ambulatory Visit: Payer: 59 | Admitting: Family Medicine

## 2018-08-30 ENCOUNTER — Ambulatory Visit: Payer: 59 | Admitting: Family Medicine

## 2018-08-31 ENCOUNTER — Encounter: Payer: Self-pay | Admitting: Nurse Practitioner

## 2018-08-31 ENCOUNTER — Ambulatory Visit: Payer: 59 | Admitting: Nurse Practitioner

## 2018-08-31 DIAGNOSIS — A609 Anogenital herpesviral infection, unspecified: Secondary | ICD-10-CM | POA: Insufficient documentation

## 2018-08-31 DIAGNOSIS — J3089 Other allergic rhinitis: Secondary | ICD-10-CM | POA: Diagnosis not present

## 2018-08-31 MED ORDER — VALACYCLOVIR HCL 500 MG PO TABS: 500.0000 mg | ORAL_TABLET | Freq: Every day | ORAL | 3 refills | Status: DC

## 2018-08-31 NOTE — Progress Notes (Signed)
Subjective:  Patient ID: [name], female    DOB: 1961-08-26  Age: 57 y.o. MRN: [id number]  CC: Follow-up (test result consult:  herpes simplex.. treatment plan?)  HPI  HSV: [name] wants to discuss use of suppressive therapy. She had 4 outbreaks within 49months. Outbreaks resolved with use of valtrex. Last outbreak 08/17/2018 (completely healed).  Reviewed past Medical, Social and Family history today.  Outpatient Medications Prior to Visit  Medication Sig Dispense Refill  . Calcium Carb-Cholecalciferol (CALTRATE 600+D) 600-800 MG-UNIT TABS Take 1 tablet by mouth 2 (two) times daily.    . cycloSPORINE (RESTASIS) 0.05 % ophthalmic emulsion 1 drop 2 (two) times daily.    [name] dexlansoprazole (DEXILANT) 60 MG capsule Take 1 capsule (60 mg total) by mouth daily. 90 capsule 3  . Docusate Calcium (STOOL SOFTENER PO) Take by mouth daily.    [name] EPIPEN 2-PAK 0.3 MG/0.3ML SOAJ injection     . FLUoxetine (PROZAC) 10 MG capsule Prozac    . FLUoxetine (PROZAC) 20 MG capsule Take 1 capsule (20 mg total) by mouth daily. 90 capsule 1  . fluticasone (FLONASE) 50 MCG/ACT nasal spray instill 2 sprays into each nostril once daily 16 g 1  . levocetirizine (XYZAL) 5 MG tablet     . magnesium oxide (MAG-OX) 400 MG tablet Take 400 mg by mouth daily.    . montelukast (SINGULAIR) 10 MG tablet     . Multiple Vitamin (MULTIVITAMINS PO) Take 1 tablet by mouth daily.      . polyethylene glycol (MIRALAX / GLYCOLAX) packet Take 17 g by mouth daily.    . valACYclovir (VALTREX) 1000 MG tablet Take 1 tablet (1,000 mg total) by mouth 3 (three) times daily. 21 tablet 0  . linaclotide (LINZESS) 145 MCG CAPS capsule Take 1 capsule (145 mcg total) by mouth daily before breakfast. 30 capsule 6  . ondansetron (ZOFRAN-ODT) 4 MG disintegrating tablet Take 1 tablet (4 mg total) by mouth every 8 (eight) hours as needed for nausea or vomiting. (Patient not taking: Reported on 08/31/2018) 15 tablet 0   No  facility-administered medications prior to visit.     ROS See HPI  Objective:  BP 102/70   Pulse 68   Temp 98.6 F (37 C) (Oral)   Ht 5\' 11"  (1.803 m)   Wt 178 lb (80.7 kg)   SpO2 96%   BMI 24.83 kg/m   BP Readings from Last 3 Encounters:  08/31/18 102/70  08/17/18 104/60  07/16/18 103/76    Wt Readings from Last 3 Encounters:  08/31/18 178 lb (80.7 kg)  08/17/18 177 lb 9.6 oz (80.6 kg)  07/16/18 174 lb (78.9 kg)    Physical Exam  Constitutional: She appears well-developed and well-nourished.  Vitals reviewed.   Lab Results  Component Value Date   WBC 9.1 07/16/2018   HGB 15.1 (H) 07/16/2018   HCT 46.9 (H) 07/16/2018   PLT 268 07/16/2018   GLUCOSE 96 07/16/2018   CHOL 154 05/03/2018   TRIG 89.0 05/03/2018   HDL 42.70 05/03/2018   LDLCALC 93 05/03/2018   ALT 21 07/16/2018   AST 25 07/16/2018   NA 140 07/16/2018   K 3.6 07/16/2018   CL 103 07/16/2018   CREATININE 0.78 07/16/2018   BUN 11 07/16/2018   CO2 27 07/16/2018   TSH 3.14 05/03/2018    Assessment & Plan:  I spent about 5mins with [name] talking about episodic vs suppressive therapy with use of valtrex. We also talked about  possible means of transmission of virus to other family members.  [name] was seen today for follow-up.  Diagnoses and all orders for this visit:  HSV (herpes simplex virus) anogenital infection -     valACYclovir (VALTREX) 500 MG tablet; Take 1 tablet (500 mg total) by mouth daily.   I have changed [name]. [name] valACYclovir. I am also having her maintain her Multiple Vitamin (MULTIVITAMINS PO), Calcium Carb-Cholecalciferol, magnesium oxide, levocetirizine, montelukast, EPIPEN 2-PAK, cycloSPORINE, fluticasone, FLUoxetine, dexlansoprazole, polyethylene glycol, Docusate Calcium (STOOL SOFTENER PO), linaclotide, ondansetron, and FLUoxetine.  Meds ordered this encounter  Medications  . valACYclovir (VALTREX) 500 MG tablet    Sig: Take 1 tablet (500 mg  total) by mouth daily.    Dispense:  90 tablet    Refill:  3    Order Specific Question:   Supervising Provider    Answer:   MATTHEWS, CODY [[id number]]    Follow-up: Return if symptoms worsen or fail to improve.  Wilfred Lacy, NP

## 2018-08-31 NOTE — Patient Instructions (Signed)
Valacyclovir caplets What is this medicine? VALACYCLOVIR (val ay SYE kloe veer) is an antiviral medicine. It is used to treat or prevent infections caused by certain kinds of viruses. Examples of these infections include herpes and shingles. This medicine will not cure herpes. This medicine may be used for other purposes; ask your health care provider or pharmacist if you have questions. COMMON BRAND NAME(S): Valtrex What should I tell my health care provider before I take this medicine? They need to know if you have any of these conditions: -acquired immunodeficiency syndrome (AIDS) -any other condition that may weaken the immune system -bone marrow or kidney transplant -kidney disease -an unusual or allergic reaction to valacyclovir, acyclovir, ganciclovir, valganciclovir, other medicines, foods, dyes, or preservatives -pregnant or trying to get pregnant -breast-feeding How should I use this medicine? Take this medicine by mouth with a glass of water. Follow the directions on the prescription label. You can take this medicine with or without food. Take your doses at regular intervals. Do not take your medicine more often than directed. Finish the full course prescribed by your doctor or health care professional even if you think your condition is better. Do not stop taking except on the advice of your doctor or health care professional. Talk to your pediatrician regarding the use of this medicine in children. While this drug may be prescribed for children as young as 2 years for selected conditions, precautions do apply. Overdosage: If you think you have taken too much of this medicine contact a poison control center or emergency room at once. NOTE: This medicine is only for you. Do not share this medicine with others. What if I miss a dose? If you miss a dose, take it as soon as you can. If it is almost time for your next dose, take only that dose. Do not take double or extra doses. What may  interact with this medicine? -cimetidine -probenecid This list may not describe all possible interactions. Give your health care provider a list of all the medicines, herbs, non-prescription drugs, or dietary supplements you use. Also tell them if you smoke, drink alcohol, or use illegal drugs. Some items may interact with your medicine. What should I watch for while using this medicine? Tell your doctor or health care professional if your symptoms do not start to get better after 1 week. This medicine works best when taken early in the course of an infection, within the first 59 hours. Begin treatment as soon as possible after the first signs of infection like tingling, itching, or pain in the affected area. It is possible that genital herpes may still be spread even when you are not having symptoms. Always use safer sex practices like condoms made of latex or polyurethane whenever you have sexual contact. You should stay well hydrated while taking this medicine. Drink plenty of fluids. What side effects may I notice from receiving this medicine? Side effects that you should report to your doctor or health care professional as soon as possible: -allergic reactions like skin rash, itching or hives, swelling of the face, lips, or tongue -aggressive behavior -confusion -hallucinations -problems with balance, talking, walking -stomach pain -tremor -trouble passing urine or change in the amount of urine Side effects that usually do not require medical attention (report to your doctor or health care professional if they continue or are bothersome): -dizziness -headache -nausea, vomiting This list may not describe all possible side effects. Call your doctor for medical advice about side effects. You  may report side effects to FDA at 1-800-FDA-1088. Where should I keep my medicine? Keep out of the reach of children. Store at room temperature between 15 and 25 degrees C (59 and 77 degrees F). Keep  container tightly closed. Throw away any unused medicine after the expiration date. NOTE: This sheet is a summary. It may not cover all possible information. If you have questions about this medicine, talk to your doctor, pharmacist, or health care provider.  2018 Elsevier/Gold Standard (2012-11-13 16:34:05)

## 2018-09-05 DIAGNOSIS — J3089 Other allergic rhinitis: Secondary | ICD-10-CM | POA: Diagnosis not present

## 2018-09-05 IMAGING — DX DG ELBOW COMPLETE 3+V*L*
4 series · 4 of 4 positions shown · non-contrast
Comparison: None.

CLINICAL DATA: Acute left elbow pain following fall today. Initial
encounter.

EXAM:
LEFT ELBOW - COMPLETE 3+ VIEW

[x elbow ap left]
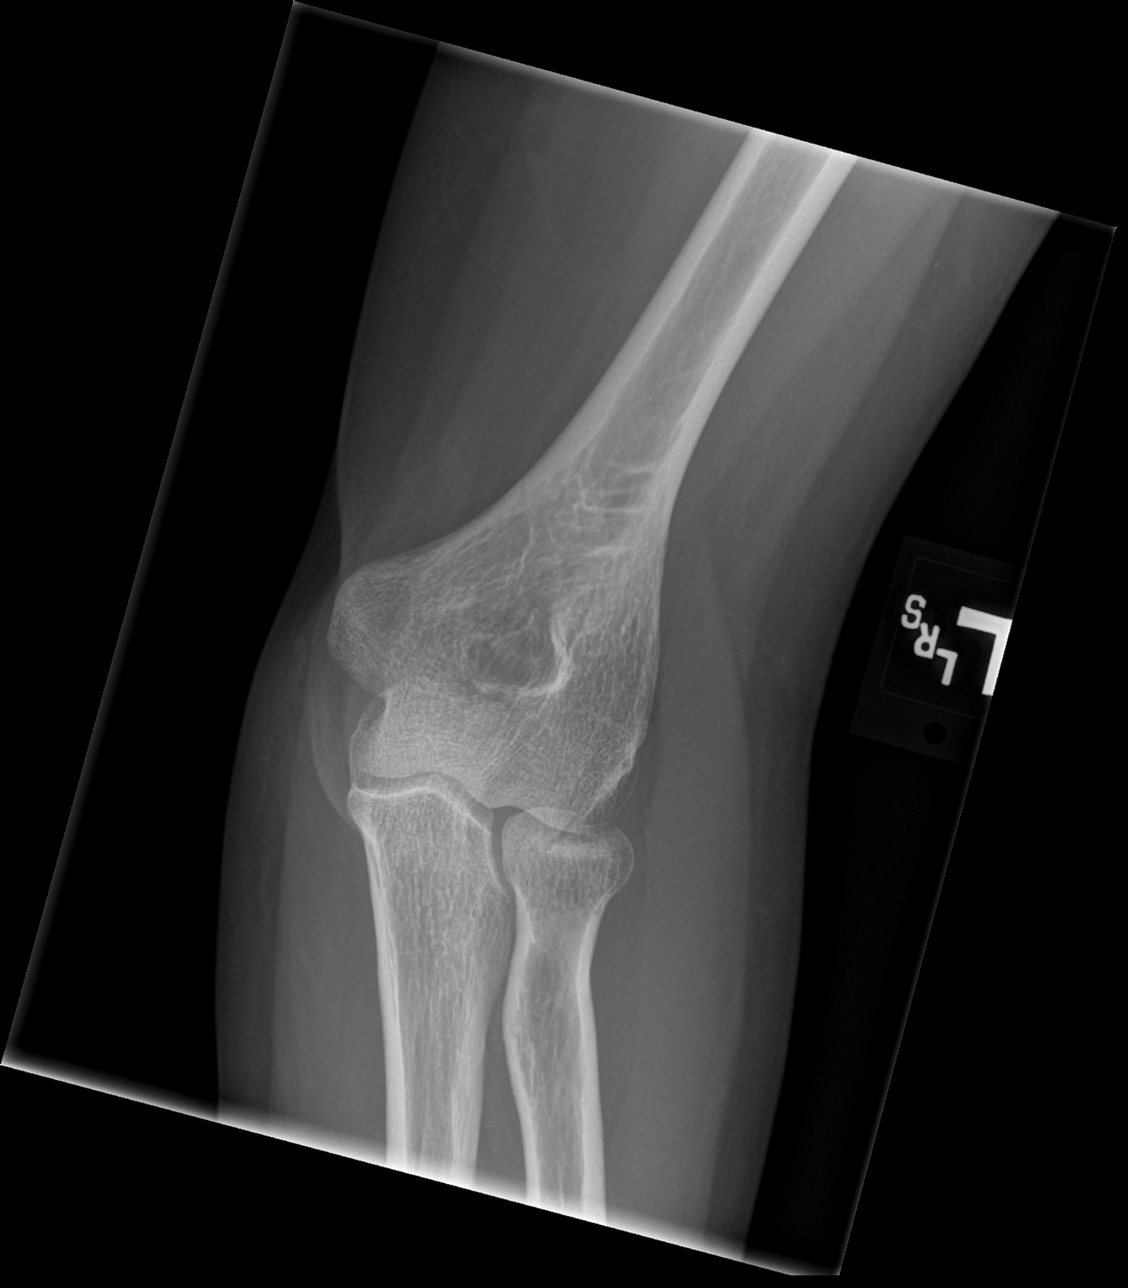

[x elbow obl left (1 of 2)]
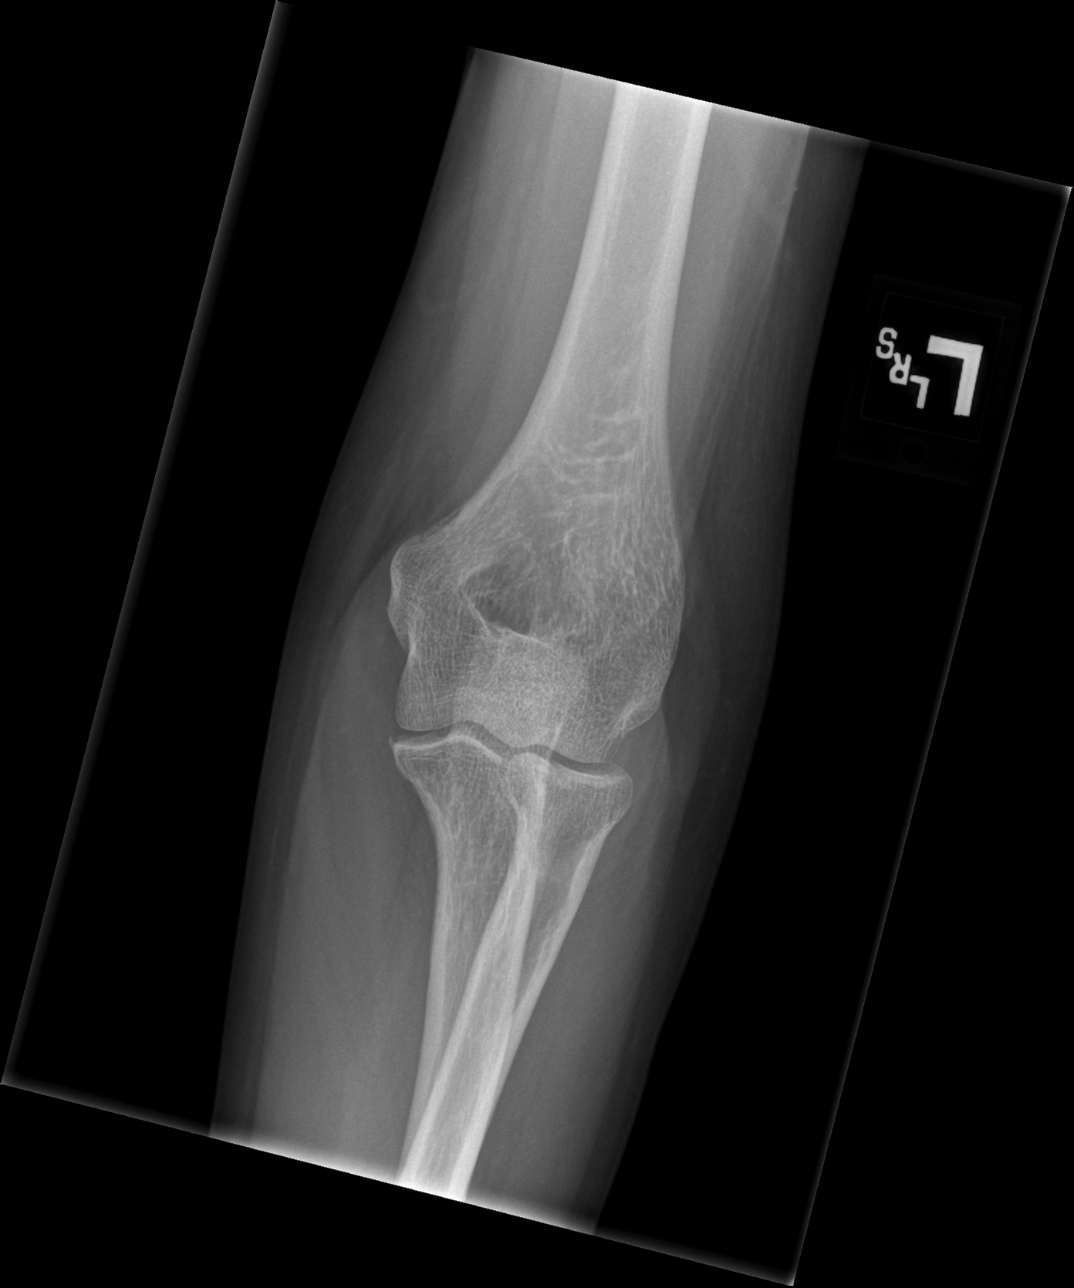

[x elbow obl left (2 of 2)]
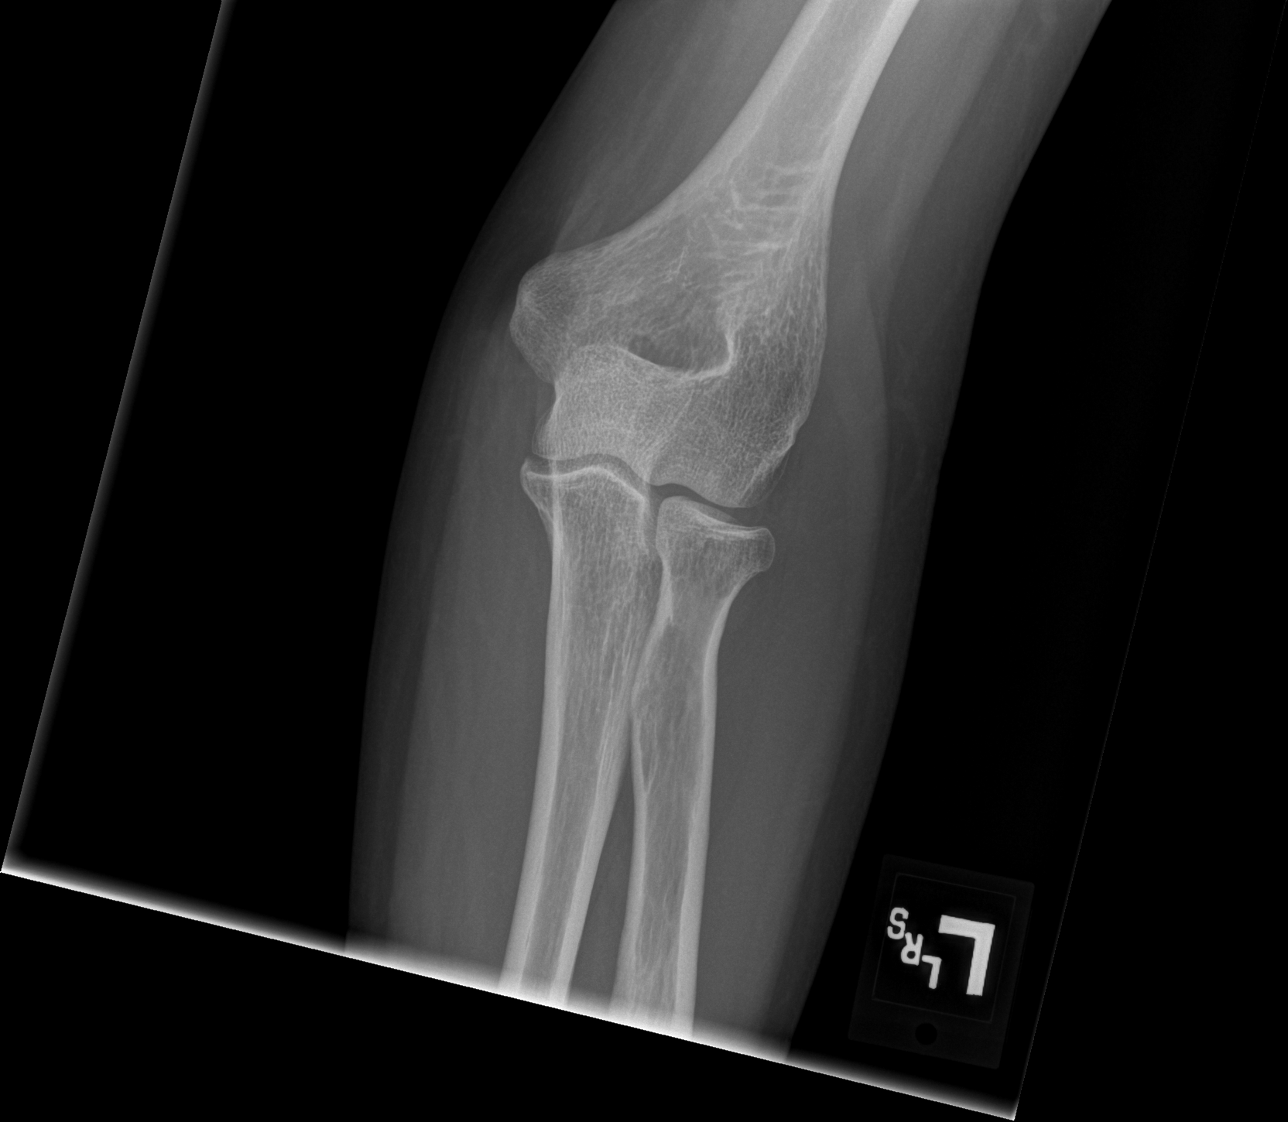

[x elbow lat left]
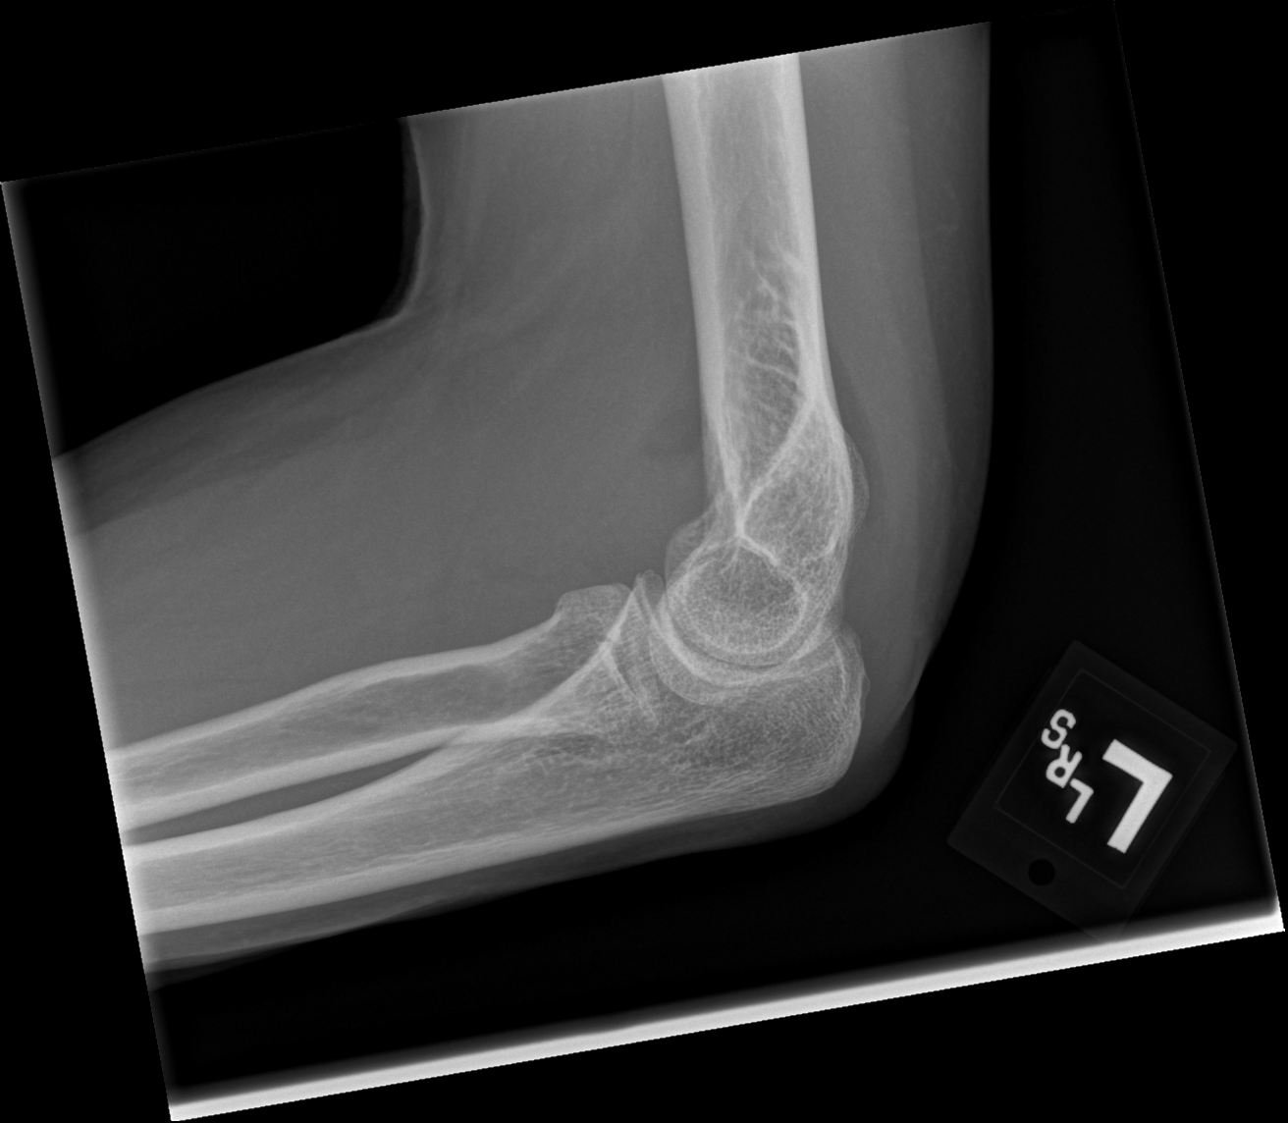

[4 of 4 positions shown; findings below may reference images not displayed]

FINDINGS: A nondisplaced radial head fracture is present.

A joint effusion is noted.

There is no evidence of subluxation or dislocation.
IMPRESSION: Nondisplaced radial head fracture with joint effusion.

## 2018-09-06 DIAGNOSIS — T1501XA Foreign body in cornea, right eye, initial encounter: Secondary | ICD-10-CM | POA: Diagnosis not present

## 2018-09-12 ENCOUNTER — Ambulatory Visit (INDEPENDENT_AMBULATORY_CARE_PROVIDER_SITE_OTHER): Payer: 59

## 2018-09-12 ENCOUNTER — Encounter: Payer: Self-pay | Admitting: Family Medicine

## 2018-09-12 DIAGNOSIS — Z23 Encounter for immunization: Secondary | ICD-10-CM

## 2018-09-12 NOTE — Progress Notes (Signed)
Patient came into the office to receive her flu vaccine. Vaccine given in the left deltoid. Patient tolerated injection well & no signs/symptoms of a reaction prior to leaving the exam room. VIS given to patient.

## 2018-09-13 DIAGNOSIS — J3089 Other allergic rhinitis: Secondary | ICD-10-CM | POA: Diagnosis not present

## 2018-09-21 DIAGNOSIS — J3089 Other allergic rhinitis: Secondary | ICD-10-CM | POA: Diagnosis not present

## 2018-09-28 DIAGNOSIS — J3089 Other allergic rhinitis: Secondary | ICD-10-CM | POA: Diagnosis not present

## 2018-10-04 DIAGNOSIS — J3089 Other allergic rhinitis: Secondary | ICD-10-CM | POA: Diagnosis not present

## 2018-10-08 DIAGNOSIS — J3089 Other allergic rhinitis: Secondary | ICD-10-CM | POA: Diagnosis not present

## 2018-10-18 DIAGNOSIS — J3089 Other allergic rhinitis: Secondary | ICD-10-CM | POA: Diagnosis not present

## 2018-10-26 DIAGNOSIS — J3089 Other allergic rhinitis: Secondary | ICD-10-CM | POA: Diagnosis not present

## 2018-10-29 DIAGNOSIS — J3089 Other allergic rhinitis: Secondary | ICD-10-CM | POA: Diagnosis not present

## 2018-11-02 DIAGNOSIS — J3089 Other allergic rhinitis: Secondary | ICD-10-CM | POA: Diagnosis not present

## 2018-11-06 DIAGNOSIS — J3089 Other allergic rhinitis: Secondary | ICD-10-CM | POA: Diagnosis not present

## 2018-11-13 DIAGNOSIS — J310 Chronic rhinitis: Secondary | ICD-10-CM | POA: Diagnosis not present

## 2018-11-13 DIAGNOSIS — J320 Chronic maxillary sinusitis: Secondary | ICD-10-CM | POA: Diagnosis not present

## 2018-11-14 ENCOUNTER — Encounter: Payer: Self-pay | Admitting: Internal Medicine

## 2018-11-14 ENCOUNTER — Ambulatory Visit (INDEPENDENT_AMBULATORY_CARE_PROVIDER_SITE_OTHER): Payer: 59 | Admitting: Internal Medicine

## 2018-11-14 VITALS — BP 100/58 | HR 64 | Ht 71.0 in | Wt 176.0 lb

## 2018-11-14 DIAGNOSIS — Z79899 Other long term (current) drug therapy: Secondary | ICD-10-CM

## 2018-11-14 DIAGNOSIS — K219 Gastro-esophageal reflux disease without esophagitis: Secondary | ICD-10-CM

## 2018-11-14 DIAGNOSIS — K5909 Other constipation: Secondary | ICD-10-CM | POA: Diagnosis not present

## 2018-11-14 MED ORDER — LINACLOTIDE 145 MCG PO CAPS: 145.0000 ug | ORAL_CAPSULE | Freq: Every day | ORAL | 6 refills | Status: DC

## 2018-11-14 NOTE — Patient Instructions (Addendum)
We have sent the following medications to your pharmacy for you to pick up at your convenience: Linzess 145 mcg once daily.  Continue Dexilant 60 mg daily.  Continue Miralax.  Your provider has requested that you go to the basement level for lab work before leaving today. Press "B" on the elevator. The lab is located at the first door on the left as you exit the elevator.  You have been scheduled for a bone density test on 11/15/18 at 10:30 am. Please arrive 15 minutes prior to your scheduled appointment to radiology on the basement floor of [place] location for this test. If you need to cancel or reschedule for any reason, please contact radiology at [phone].  Preparation for test is as follows:   If you are taking calcium, discontinue this 24-48 hours prior to your appointment.   Wear pants with an elastic waistband (or without any metal such as a zipper).   Do not wear an underwire bra.   We do have gowns if you are unable to find appropriate clothing without metal.   Please bring a list of all current medications.    If you are age 91 or older, your body mass index should be between 23-30. Your Body mass index is 24.55 kg/m. If this is out of the aforementioned range listed, please consider follow up with your Primary Care Provider.  If you are age 28 or younger, your body mass index should be between 19-25. Your Body mass index is 24.55 kg/m. If this is out of the aformentioned range listed, please consider follow up with your Primary Care Provider.

## 2018-11-14 NOTE — Progress Notes (Signed)
Subjective:    Patient ID: [name], female    DOB: 1961/01/14, 57 y.o.   MRN: [id number]  HPI [name] is a 57 year old female with a history of GERD, chronic constipation who is here for follow-up.  She was last seen on 05/31/2018 by Alonza Bogus, PA-C.  She also has a history of ASD status post repair, endometriosis, migraines and back pain.  She has been started on Linzess 145 mcg daily.  She uses this in the morning.  She also uses MiraLAX 17 g at night.  She is off Colace.  This regimen is working well for her.  There are some days where she feels less complete evacuation and then in the next day will have a more full and complete bowel movement.  No blood in her stool or melena.  Bowel habits are much improved with current regimen.  GERD symptoms been well controlled with Dexilant 60 mg daily.  No dysphagia or odynophagia.  No upper abdominal pain, nausea, vomiting or early satiety.  She is now engaged and will be getting married in April 2020.  She is very excited about her relationship and her new marriage.   Review of Systems As per HPI, otherwise negative  Current Medications, Allergies, Past Medical History, Past Surgical History, Family History and Social History were reviewed in [employer] record.     Objective:   Physical Exam BP (!) 100/58   Pulse 64   Ht 5\' 11"  (1.803 m)   Wt 176 lb (79.8 kg)   BMI 24.55 kg/m  Constitutional: Well-developed and well-nourished. No distress. HEENT: Normocephalic and atraumatic.  Conjunctivae are normal.  No scleral icterus. Neck: Neck supple. Trachea midline. Cardiovascular: Normal rate, regular rhythm and intact distal pulses. No M/R/G Pulmonary/chest: Effort normal and breath sounds normal. No wheezing, rales or rhonchi. Abdominal: Soft, nontender, nondistended. Bowel sounds active throughout. There are no masses palpable. No hepatosplenomegaly. Extremities: no clubbing, cyanosis, or  edema Neurological: Alert and oriented to person place and time. Skin: Skin is warm and dry. Psychiatric: Normal mood and affect. Behavior is normal.  CBC    Component Value Date/Time   WBC 9.1 07/16/2018 1022   RBC 5.03 07/16/2018 1022   HGB 15.1 (H) 07/16/2018 1022   HCT 46.9 (H) 07/16/2018 1022   PLT 268 07/16/2018 1022   MCV 93.2 07/16/2018 1022   MCH 30.0 07/16/2018 1022   MCHC 32.2 07/16/2018 1022   RDW 13.3 07/16/2018 1022   LYMPHSABS 2.3 07/16/2018 1022   MONOABS 0.8 07/16/2018 1022   EOSABS 0.0 07/16/2018 1022   BASOSABS 0.0 07/16/2018 1022   CMP     Component Value Date/Time   NA 140 07/16/2018 1022   K 3.6 07/16/2018 1022   CL 103 07/16/2018 1022   CO2 27 07/16/2018 1022   GLUCOSE 96 07/16/2018 1022   BUN 11 07/16/2018 1022   CREATININE 0.78 07/16/2018 1022   CALCIUM 9.3 07/16/2018 1022   PROT 6.5 07/16/2018 1022   ALBUMIN 3.7 07/16/2018 1022   AST 25 07/16/2018 1022   ALT 21 07/16/2018 1022   ALKPHOS 70 07/16/2018 1022   BILITOT 1.1 07/16/2018 1022   GFRNONAA >60 07/16/2018 1022   GFRAA >60 07/16/2018 1022       Assessment & Plan:  57 year old female with a history of GERD, chronic constipation who is here for follow-up.  1.  Chronic constipation --doing well with favorable response to Linzess.  Continue Linzess 145 mcg daily.  Continue  MiraLAX 17 g at bedtime  2.  GERD --doing well on Dexilant 60 mg daily.  We reviewed the risk, benefits and alternatives to chronic PPI use.  I have recommended bone density scan and I have ordered this.  Also check magnesium today.  In the past we did reduce Dexilant to 30 mg and she had breakthrough symptoms.  3.  CRC screening --up-to-date, next screening colonoscopy recommended March 2022  25 minutes spent with the patient today. Greater than 50% was spent in counseling and coordination of care with the patient

## 2018-11-15 ENCOUNTER — Telehealth: Payer: Self-pay | Admitting: Internal Medicine

## 2018-11-15 ENCOUNTER — Other Ambulatory Visit (INDEPENDENT_AMBULATORY_CARE_PROVIDER_SITE_OTHER): Payer: 59

## 2018-11-15 ENCOUNTER — Ambulatory Visit (INDEPENDENT_AMBULATORY_CARE_PROVIDER_SITE_OTHER)
Admission: RE | Admit: 2018-11-15 | Discharge: 2018-11-15 | Disposition: A | Discharge status: Home / Self Care | Payer: 59 | Source: Ambulatory Visit | Attending: Internal Medicine | Admitting: Internal Medicine

## 2018-11-15 DIAGNOSIS — K219 Gastro-esophageal reflux disease without esophagitis: Secondary | ICD-10-CM

## 2018-11-15 DIAGNOSIS — Z79899 Other long term (current) drug therapy: Secondary | ICD-10-CM

## 2018-11-15 LAB — MAGNESIUM: Magnesium: 2.1 mg/dL (ref 1.5–2.5)

## 2018-11-15 NOTE — Telephone Encounter (Signed)
Per Dr. Hilarie Fredrickson pt may have cmet checked, order in epic.

## 2018-11-19 DIAGNOSIS — J3089 Other allergic rhinitis: Secondary | ICD-10-CM | POA: Diagnosis not present

## 2018-11-29 DIAGNOSIS — J3089 Other allergic rhinitis: Secondary | ICD-10-CM | POA: Diagnosis not present

## 2018-12-17 DIAGNOSIS — J3089 Other allergic rhinitis: Secondary | ICD-10-CM | POA: Diagnosis not present

## 2018-12-19 DIAGNOSIS — J3089 Other allergic rhinitis: Secondary | ICD-10-CM | POA: Diagnosis not present

## 2018-12-28 DIAGNOSIS — H1045 Other chronic allergic conjunctivitis: Secondary | ICD-10-CM | POA: Diagnosis not present

## 2018-12-28 DIAGNOSIS — R05 Cough: Secondary | ICD-10-CM | POA: Diagnosis not present

## 2018-12-28 DIAGNOSIS — J3089 Other allergic rhinitis: Secondary | ICD-10-CM | POA: Diagnosis not present

## 2019-01-04 DIAGNOSIS — J3089 Other allergic rhinitis: Secondary | ICD-10-CM | POA: Diagnosis not present

## 2019-01-08 DIAGNOSIS — J3089 Other allergic rhinitis: Secondary | ICD-10-CM | POA: Diagnosis not present

## 2019-01-11 DIAGNOSIS — J3089 Other allergic rhinitis: Secondary | ICD-10-CM | POA: Diagnosis not present

## 2019-01-18 DIAGNOSIS — J3089 Other allergic rhinitis: Secondary | ICD-10-CM | POA: Diagnosis not present

## 2019-01-25 DIAGNOSIS — J3089 Other allergic rhinitis: Secondary | ICD-10-CM | POA: Diagnosis not present

## 2019-01-30 DIAGNOSIS — J3089 Other allergic rhinitis: Secondary | ICD-10-CM | POA: Diagnosis not present

## 2019-02-11 ENCOUNTER — Other Ambulatory Visit: Payer: Self-pay | Admitting: Family Medicine

## 2019-02-11 NOTE — Telephone Encounter (Signed)
Copied from [place] [id number]. Topic: Quick Communication - Rx Refill/Question >> Feb 11, 2019  2:45 PM [place], [place] D wrote: Medication: dexlansoprazole (DEXILANT) 60 MG capsule / Pt stated she called pharmacy for refill and was told she had no refills and to call PCP. Please advise.  Has the patient contacted their pharmacy? Yes.   (Agent: If no, request that the patient contact the pharmacy for the refill.) (Agent: If yes, when and what did the pharmacy advise?)  Preferred Pharmacy (with phone number or street name): [id number] [address], [place] [phone] (Phone) [phone] (Fax)  Agent: Please be advised that RX refills may take up to 3 business days. We ask that you follow-up with your pharmacy.

## 2019-02-12 MED ORDER — DEXLANSOPRAZOLE 60 MG PO CPDR: 1.0000 | DELAYED_RELEASE_CAPSULE | Freq: Every day | ORAL | 0 refills | Status: DC

## 2019-02-12 NOTE — Telephone Encounter (Signed)
Requested Prescriptions  Pending Prescriptions Disp Refills  . dexlansoprazole (DEXILANT) 60 MG capsule 90 capsule 0    Sig: Take 1 capsule (60 mg total) by mouth daily.     Gastroenterology: Proton Pump Inhibitors Passed - 02/11/2019  3:06 PM      Passed - Valid encounter within last 12 months    Recent Outpatient Visits          5 months ago HSV (herpes simplex virus) anogenital infection   [facility], Charlene Brooke, NP   5 months ago Herpetic lesion   [facility] [place], Charlene Brooke, NP   9 months ago Well woman exam with routine gynecological exam   [facility], Marciano Sequin, MD   9 months ago    [facility] [address] [place], Marciano Sequin, MD   9 months ago Acute bacterial sinusitis   [facility], Marciano Sequin, MD      Future Appointments            In 2 months Lucille Passy, MD [facility], [facility]

## 2019-02-13 DIAGNOSIS — J3089 Other allergic rhinitis: Secondary | ICD-10-CM | POA: Diagnosis not present

## 2019-02-19 ENCOUNTER — Ambulatory Visit: Payer: Self-pay | Admitting: *Deleted

## 2019-02-19 NOTE — Telephone Encounter (Signed)
Pt questioning if she should get pneumonia vaccine as she is turning 58 this year.  Advised generally the age for vaccine is 77. Pt states she is concerned because of her history of allergies. States she has appt with Dr. Deborra Medina tomorrow. Advised to discuss with PCP at appt. Pt verbalizes understanding.    Reason for Disposition . Health Information question, no triage required and triager able to answer question  Answer Assessment - Initial Assessment Questions 1. REASON FOR CALL or QUESTION: "What is your reason for calling today?" or "How can I best help you?" or "What question do you have that I can help answer?"     Questioning if she should get pneumonia vaccine  Protocols used: INFORMATION ONLY CALL-A-AH

## 2019-02-20 ENCOUNTER — Ambulatory Visit: Payer: 59 | Admitting: Family Medicine

## 2019-02-20 ENCOUNTER — Other Ambulatory Visit: Payer: Self-pay

## 2019-02-20 ENCOUNTER — Encounter: Payer: Self-pay | Admitting: Family Medicine

## 2019-02-20 VITALS — BP 92/60 | HR 60 | Temp 98.5°F | Ht 71.0 in | Wt 178.6 lb

## 2019-02-20 DIAGNOSIS — R0683 Snoring: Secondary | ICD-10-CM

## 2019-02-20 DIAGNOSIS — G4733 Obstructive sleep apnea (adult) (pediatric): Secondary | ICD-10-CM | POA: Insufficient documentation

## 2019-02-20 NOTE — Progress Notes (Signed)
Subjective:   Patient ID: [name], female    DOB: [birth year], 58 y.o.   MRN: [id number]  [name] is a pleasant 58 y.o. year old female who presents to clinic today with Snoring (Patient is here today for a referral for a sleep study.  She states that she snores and her fiance' states that she stops breathing sometimes too.  )  on 02/20/2019  HPI:  Snoring- here to for sleep study referral.  States that she snores and her fiance feels she does stop breathing sometimes too. She has noticed more fatigue during the day but she attributed that to wedding planning.  Has always been told that she snores but has never been told she had possible apneic episodes.  No CP or SOB.  She is not a smoker.  Wt Readings from Last 3 Encounters:  02/20/19 178 lb 9.6 oz (81 kg)  11/14/18 176 lb (79.8 kg)  08/31/18 178 lb (80.7 kg)     Current Outpatient Medications on File Prior to Visit  Medication Sig Dispense Refill  . Calcium Carb-Cholecalciferol (CALTRATE 600+D) 600-800 MG-UNIT TABS Take 1 tablet by mouth 2 (two) times daily.    . cycloSPORINE (RESTASIS) 0.05 % ophthalmic emulsion 1 drop 2 (two) times daily.    [name] dexlansoprazole (DEXILANT) 60 MG capsule Take 1 capsule (60 mg total) by mouth daily. 90 capsule 0  . Docusate Calcium (STOOL SOFTENER PO) Take by mouth daily.    [name] EPIPEN 2-PAK 0.3 MG/0.3ML SOAJ injection     . FLUoxetine (PROZAC) 10 MG capsule Prozac    . fluticasone (FLONASE) 50 MCG/ACT nasal spray instill 2 sprays into each nostril once daily 16 g 1  . levocetirizine (XYZAL) 5 MG tablet     . linaclotide (LINZESS) 145 MCG CAPS capsule Take 1 capsule (145 mcg total) by mouth daily before breakfast. 30 capsule 6  . magnesium oxide (MAG-OX) 400 MG tablet Take 400 mg by mouth daily.    . montelukast (SINGULAIR) 10 MG tablet     . Multiple Vitamin (MULTIVITAMINS PO) Take 1 tablet by mouth daily.      . ondansetron (ZOFRAN-ODT) 4 MG disintegrating tablet Take 1 tablet  (4 mg total) by mouth every 8 (eight) hours as needed for nausea or vomiting. 15 tablet 0  . polyethylene glycol (MIRALAX / GLYCOLAX) packet Take 17 g by mouth daily.    . valACYclovir (VALTREX) 500 MG tablet Take 1 tablet (500 mg total) by mouth daily. 90 tablet 3   No current facility-administered medications on file prior to visit.     Allergies  Allergen Reactions  . Chlordiazepoxide-Clidinium     "loopy"  . Codeine     REACTION: nausea,vomitting and dizziness  . Penicillins     REACTION: hives ---taken as a child    Past Medical History:  Diagnosis Date  . Allergic rhinitis   . Anxiety   . Back pain   . Contact lens/glasses fitting   . Depression   . Endometriosis   . Fatigue   . GERD (gastroesophageal reflux disease)   . Hiatal hernia   . History of cardiac catheterization 12/1990  . History of Ostium Secundum Atrial Septal Defect 1989   Status post repair in 1989/1990  . Hx of migraines   . IBS (irritable bowel syndrome)   . Incontinence   . Intestinal volvulus [facility]) May 2007  . Loss of appetite   . Onychomycosis   . Varicose veins  Past Surgical History:  Procedure Laterality Date  . ABDOMINAL HYSTERECTOMY  2004  . ASD REPAIR, SECUNDUM  1990   Dr. Servando Snare  . BALLOON SINUPLASTY Left   . EYE SURGERY     muscle reattachment 02/2017  . hysterectomy for severe endometriosis    . NASAL SEPTUM SURGERY     Dr. Georgia Lopes  . OPEN REDUCTION INTERNAL FIXATION (ORIF) DISTAL RADIAL FRACTURE Right 06/20/2017   Procedure: OPEN REDUCTION INTERNAL FIXATION (ORIF) DISTAL RADIAL FRACTURE;  Surgeon: Leanora Cover, MD;  Location: [place];  Service: Orthopedics;  Laterality: Right;  . right ankle surgery  2010  . right leg fracture with surgery     done with ankle surgery  . TRANSTHORACIC ECHOCARDIOGRAM   January 2012   Normal LV size and function, EF greater than 55%. Mild LA dilation. No intra-atrial shunt with bubble study. Normal pulmonary pressures.  Only trace to mild mitral and tricuspid regurgitation.  . varicose laser vein surgery  2009    Family History  Problem Relation Age of Onset  . Diabetes Mother   . Colon cancer Paternal Grandfather   . Heart disease Maternal Grandfather   . Prostate cancer Maternal Grandfather   . Lung cancer Maternal Grandmother        with mets to the brain  . Clotting disorder Maternal Grandmother   . Clotting disorder Sister   . Heart disease Sister     Social History   Socioeconomic History  . Marital status: Divorced    Spouse name: Richard  . Number of children: Not on file  . Years of education: Not on file  . Highest education level: Not on file  Occupational History  . Occupation: Scientist, clinical (histocompatibility and immunogenetics): [employer]  . Financial resource strain: Not on file  . Food insecurity:    Worry: Not on file    Inability: Not on file  . Transportation needs:    Medical: Not on file    Non-medical: Not on file  Tobacco Use  . Smoking status: Former Smoker    Packs/day: 1.00    Years: 10.00    Pack years: 10.00    Types: Cigarettes    Last attempt to quit: 12/12/1988    Years since quitting: 30.2  . Smokeless tobacco: Never Used  Substance and Sexual Activity  . Alcohol use: Yes    Comment: social use/wine  . Drug use: No  . Sexual activity: Not on file  Lifestyle  . Physical activity:    Days per week: Not on file    Minutes per session: Not on file  . Stress: Not on file  Relationships  . Social connections:    Talks on phone: Not on file    Gets together: Not on file    Attends religious service: Not on file    Active member of club or organization: Not on file    Attends meetings of clubs or organizations: Not on file    Relationship status: Not on file  . Intimate partner violence:    Fear of current or ex partner: Not on file    Emotionally abused: Not on file    Physically abused: Not on file    Forced sexual activity: Not on file  Other  Topics Concern  . Not on file  Social History Narrative   They have 2 children and at least two grandchildren. She does all her activities of daily living. She works  as a Midwife for [employer].     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 She does other days of the gym and she is able to at least 60 minutes a day 5 days a week.   She is a former smoker who quit in 1990. This was after smoking a pack a day for 10 years. She drinks social alcohol on occasion.   The PMH, PSH, Social History, Family History, Medications, and allergies have been reviewed in [facility], and have been updated if relevant.   Review of Systems  Constitutional: Negative.   HENT: Negative.   Respiratory: Positive for apnea. Negative for cough, choking, chest tightness, shortness of breath, wheezing and stridor.   Cardiovascular: Negative.   Musculoskeletal: Negative.   Neurological: Negative.   Psychiatric/Behavioral: Negative.   All other systems reviewed and are negative.      Objective:    BP 92/60 (BP Location: Left Arm, Patient Position: Sitting, Cuff Size: Normal)   Pulse 60   Temp 98.5 F (36.9 C) (Oral)   Ht 5\' 11"  (1.803 m)   Wt 178 lb 9.6 oz (81 kg)   SpO2 97%   BMI 24.91 kg/m    Physical Exam Vitals signs and nursing note reviewed.  Constitutional:      Appearance: Normal appearance.  HENT:     Head: Normocephalic.     Nose: Nose normal.     Mouth/Throat:     Mouth: Mucous membranes are moist.  Eyes:     Extraocular Movements: Extraocular movements intact.  Neck:     Musculoskeletal: Normal range of motion.  Cardiovascular:     Rate and Rhythm: Normal rate and regular rhythm.  Pulmonary:     Effort: Pulmonary effort is normal.     Breath sounds: Normal breath sounds.  Musculoskeletal:     Right lower leg: No edema.     Left lower leg: No edema.  Skin:    General: Skin is warm  and dry.  Neurological:     General: No focal deficit present.     Mental Status: She is alert and oriented to person, place, and time.  Psychiatric:        Mood and Affect: Mood normal.        Behavior: Behavior normal.        Thought Content: Thought content normal.           Assessment & Plan:   Snoring - Plan: Ambulatory referral to Pulmonology No follow-ups on file.

## 2019-02-20 NOTE — Patient Instructions (Signed)
Great to see you.  Someone will call you with your lung doctor appointment.  Congratulations!!!

## 2019-02-20 NOTE — Assessment & Plan Note (Signed)
With possible apenic episodes. >15 minutes spent in face to face time with patient, >50% spent in counselling or coordination of care. Refer to pulmonary for sleep study.  Answered her questions about sleep study- she is likely a great candidate for home sleep study but pulmonologist would need to make that decision. The patient indicates understanding of these issues and agrees with the plan.

## 2019-02-22 DIAGNOSIS — J3089 Other allergic rhinitis: Secondary | ICD-10-CM | POA: Diagnosis not present

## 2019-02-28 DIAGNOSIS — J3089 Other allergic rhinitis: Secondary | ICD-10-CM | POA: Diagnosis not present

## 2019-03-02 ENCOUNTER — Other Ambulatory Visit: Payer: Self-pay | Admitting: Family Medicine

## 2019-03-06 ENCOUNTER — Other Ambulatory Visit: Payer: Self-pay | Admitting: Family Medicine

## 2019-03-07 ENCOUNTER — Other Ambulatory Visit: Payer: Self-pay | Admitting: Family Medicine

## 2019-03-07 DIAGNOSIS — J3089 Other allergic rhinitis: Secondary | ICD-10-CM | POA: Diagnosis not present

## 2019-03-08 ENCOUNTER — Other Ambulatory Visit: Payer: Self-pay | Admitting: Family Medicine

## 2019-03-08 MED ORDER — FLUOXETINE HCL 10 MG PO CAPS: ORAL_CAPSULE | ORAL | 3 refills | Status: DC

## 2019-03-08 NOTE — Telephone Encounter (Signed)
Please advise 

## 2019-03-08 NOTE — Telephone Encounter (Signed)
Requested medication (s) are due for refill today: Yes  Requested medication (s) are on the active medication list: Yes  Last refill:  08/17/18  Future visit scheduled: Yes  Notes to clinic:  Historical provider.    Requested Prescriptions  Pending Prescriptions Disp Refills   FLUoxetine (PROZAC) 10 MG capsule      Sig: Prozac     Psychiatry:  Antidepressants - SSRI Passed - 03/07/2019  8:17 PM      Passed - Valid encounter within last 6 months    Recent Outpatient Visits          2 weeks ago Snoring   [facility], Marciano Sequin, MD   6 months ago HSV (herpes simplex virus) anogenital infection   [facility] [place], Charlene Brooke, NP   6 months ago Herpetic lesion   [facility] [place], Charlene Brooke, NP   10 months ago Well woman exam with routine gynecological exam   [facility] [address], Marciano Sequin, MD   10 months ago    [facility] [address], Marciano Sequin, MD      Future Appointments            In 1 month Deborra Medina, Marciano Sequin, MD [facility]

## 2019-03-12 ENCOUNTER — Other Ambulatory Visit: Payer: Self-pay | Admitting: Family Medicine

## 2019-03-13 DIAGNOSIS — J3089 Other allergic rhinitis: Secondary | ICD-10-CM | POA: Diagnosis not present

## 2019-03-20 DIAGNOSIS — J3089 Other allergic rhinitis: Secondary | ICD-10-CM | POA: Diagnosis not present

## 2019-03-28 ENCOUNTER — Telehealth: Payer: Self-pay | Admitting: Neurology

## 2019-03-28 DIAGNOSIS — J3089 Other allergic rhinitis: Secondary | ICD-10-CM | POA: Diagnosis not present

## 2019-03-28 NOTE — Telephone Encounter (Signed)
Due to current COVID 19 pandemic, our office is severely reducing in office visits, in order to minimize the risk to our patients and healthcare providers.  Pt understands that although there may be some limitations with this type of visit, we will take all precautions to reduce any security or privacy concerns.  Pt understands that this will be treated like an in office visit and we will file with pt's insurance, and there may be a patient responsible charge related to this service. Pt's email is [email or web address]. Pt understands that the cisco webex software must be downloaded and operational on the device pt plans to use for the visit. Pt understands that the nurse will be calling to go over pt's chart.

## 2019-04-03 DIAGNOSIS — N950 Postmenopausal bleeding: Secondary | ICD-10-CM | POA: Insufficient documentation

## 2019-04-03 NOTE — Telephone Encounter (Signed)
I called pt. Pt's meds, allergies, and PMH were updated.  Pt has never had a sleep study but does endorse snoring.   Pt was instructed on how to measure her neck size prior to her appt next week.  Pt reports that her recent weight is 175 lbs and she is 5'11.  Epworth Sleepiness Scale 0= would never doze 1= slight chance of dozing 2= moderate chance of dozing 3= high chance of dozing  Sitting and reading: 1 Watching TV: 1 Sitting inactive in a public place (ex. Theater or meeting): 0 As a passenger in a car for an hour without a break: 1 Lying down to rest in the afternoon: 3 Sitting and talking to someone: 0 Sitting quietly after lunch (no alcohol): 1 In a car, while stopped in traffic: 0 Total: 7  FSS: 22

## 2019-04-03 NOTE — Progress Notes (Unsigned)
Virtual Visit via Video   I connected with [name] on 04/04/19 at 12:00 PM EDT by a video enabled telemedicine application and verified that I am speaking with the correct person using two identifiers. Location patient: Home Location provider: [facility], Office Persons participating in the virtual visit: Marice Potter, MD   I discussed the limitations of evaluation and management by telemedicine and the availability of in person appointments. The patient expressed understanding and agreed to proceed.  Subjective:   HPI:     Review of Systems  All other systems reviewed and are negative.    ROS: See pertinent positives and negatives per HPI.  Patient Active Problem List   Diagnosis Date Noted  . Post-menopausal bleeding 04/03/2019  . Snoring 02/20/2019  . HSV (herpes simplex virus) anogenital infection 08/31/2018  . Chronic constipation 05/31/2018  . Screening examination for STD (sexually transmitted disease) 05/10/2018  . HSV-2 infection 05/10/2018  . Eczema 01/09/2016  . Dandruff 07/20/2015  . GERD (gastroesophageal reflux disease)-probable paroxsysmal relaxation LES 03/14/2012  . Hair loss 06/29/2011  . GERD 02/28/2011  . ALLERGIC RHINITIS 03/11/2010  . ENDOMETRIOSIS 03/06/2008  . VARICOSE VEINS, LOWER EXTREMITIES 01/08/2008  . INTESTINAL VOLVULUS, LARGE BOWEL 01/08/2008  . Anxiety 01/07/2008  . IRRITABLE BOWEL SYNDROME 01/07/2008  . MIGRAINES, HX OF 01/07/2008  . ATRIAL SEPTAL DEFECT - Status Post Repair 01/08/1988    Social History   Tobacco Use  . Smoking status: Former Smoker    Packs/day: 1.00    Years: 10.00    Pack years: 10.00    Types: Cigarettes    Last attempt to quit: 12/12/1988    Years since quitting: 30.3  . Smokeless tobacco: Never Used  Substance Use Topics  . Alcohol use: Yes    Comment: social use/wine    Current Outpatient Medications:  .  Ascorbic Acid (VITA-C PO), Take by mouth., Disp: , Rfl:  .  b  complex vitamins capsule, Take 1 capsule by mouth daily., Disp: , Rfl:  .  Calcium Carb-Cholecalciferol (CALTRATE 600+D) 600-800 MG-UNIT TABS, Take 1 tablet by mouth 2 (two) times daily., Disp: , Rfl:  .  cycloSPORINE (RESTASIS) 0.05 % ophthalmic emulsion, 1 drop 2 (two) times daily., Disp: , Rfl:  .  dexlansoprazole (DEXILANT) 60 MG capsule, Take 1 capsule (60 mg total) by mouth daily., Disp: 90 capsule, Rfl: 0 .  EPIPEN 2-PAK 0.3 MG/0.3ML SOAJ injection, , Disp: , Rfl:  .  FLUoxetine (PROZAC) 20 MG capsule, TAKE 1 CAPSULE BY MOUTH EVERY DAY, Disp: 90 capsule, Rfl: 0 .  fluticasone (FLONASE) 50 MCG/ACT nasal spray, instill 2 sprays into each nostril once daily, Disp: 16 g, Rfl: 1 .  levocetirizine (XYZAL) 5 MG tablet, , Disp: , Rfl:  .  linaclotide (LINZESS) 145 MCG CAPS capsule, Take 1 capsule (145 mcg total) by mouth daily before breakfast., Disp: 30 capsule, Rfl: 6 .  magnesium oxide (MAG-OX) 400 MG tablet, Take 400 mg by mouth daily., Disp: , Rfl:  .  montelukast (SINGULAIR) 10 MG tablet, , Disp: , Rfl:  .  Multiple Vitamin (MULTIVITAMINS PO), Take 1 tablet by mouth daily.  , Disp: , Rfl:  .  ondansetron (ZOFRAN-ODT) 4 MG disintegrating tablet, Take 1 tablet (4 mg total) by mouth every 8 (eight) hours as needed for nausea or vomiting., Disp: 15 tablet, Rfl: 0 .  polyethylene glycol (MIRALAX / GLYCOLAX) packet, Take 17 g by mouth daily., Disp: , Rfl:  .  valACYclovir (VALTREX)  500 MG tablet, Take 1 tablet (500 mg total) by mouth daily., Disp: 90 tablet, Rfl: 3  Allergies  Allergen Reactions  . Chlordiazepoxide-Clidinium     "loopy"  . Codeine     REACTION: nausea,vomitting and dizziness  . Penicillins     REACTION: hives ---taken as a child    Objective:  There were no vitals taken for this visit.  VITALS: Per patient if applicable, see vitals. GENERAL: Alert, appears well and in no acute distress. HEENT: Atraumatic, conjunctiva clear, no obvious abnormalities on inspection of  external nose and ears. NECK: Normal movements of the head and neck. CARDIOPULMONARY: No increased WOB. Speaking in clear sentences. I:E ratio WNL.  MS: Moves all visible extremities without noticeable abnormality. PSYCH: Pleasant and cooperative, well-groomed. Speech normal rate and rhythm. Affect is appropriate. Insight and judgement are appropriate. Attention is focused, linear, and appropriate.  NEURO: CN grossly intact. Oriented as arrived to appointment on time with no prompting. Moves both UE equally.  SKIN: No obvious lesions, wounds, erythema, or cyanosis noted on face or hands.  Assessment and Plan:   [name] was seen today for post menopausal bleeding.  Diagnoses and all orders for this visit:  Post-menopausal bleeding    . Reviewed expectations re: course of current medical issues. . Discussed self-management of symptoms. . Outlined signs and symptoms indicating need for more acute intervention. . Patient verbalized understanding and all questions were answered. [name] Health Maintenance issues including appropriate healthy diet, exercise, and smoking avoidance were discussed with patient. . See orders for this visit as documented in the electronic medical record.  Arnette Norris, MD 04/04/2019

## 2019-04-03 NOTE — Addendum Note (Signed)
Addended by: Lester [place] A on: 04/03/2019 11:16 AM   Modules accepted: Orders

## 2019-04-04 ENCOUNTER — Ambulatory Visit (INDEPENDENT_AMBULATORY_CARE_PROVIDER_SITE_OTHER): Payer: 59 | Admitting: Family Medicine

## 2019-04-04 ENCOUNTER — Ambulatory Visit: Payer: 59 | Admitting: Family Medicine

## 2019-04-04 ENCOUNTER — Telehealth: Payer: Self-pay | Admitting: Family Medicine

## 2019-04-04 DIAGNOSIS — N950 Postmenopausal bleeding: Secondary | ICD-10-CM

## 2019-04-04 NOTE — Telephone Encounter (Signed)
Patient called about her appointment she had scheduled today, she originally had 9am appt but it was changed yesterday afternoon to 12pm, She said she cant do this because she has a meeting. Dr Evalee Mutton be back until Monday so she is wondering if she can get a call from the nurse.

## 2019-04-04 NOTE — Assessment & Plan Note (Signed)
New- one time occurrence, likely due more from more vigorous intercourse in a post menopausal woman with some vaginal atrophy.  Advised using lubricant but if any bleeding happens again, I do want her to be seen by GYN for possible endo metrial biopsy. The patient indicates understanding of these issues and agrees with the plan.

## 2019-04-04 NOTE — Telephone Encounter (Signed)
I tried to call her already this morning.  I moved her on the schedule but was going to still try to see around her appointment time. I have placed her back on the schedule.

## 2019-04-04 NOTE — Progress Notes (Signed)
Virtual Visit via Video   I connected with [name] on 04/04/19 at 12:00 PM EDT by a video enabled telemedicine application and verified that I am speaking with the correct person using two identifiers. Location patient: Home Location provider: [facility], Office Persons participating in the virtual visit: Marice Potter, MD   I discussed the limitations of evaluation and management by telemedicine and the availability of in person appointments. The patient expressed understanding and agreed to proceed.  Subjective:   HPI: Post menopausal bleeding. Got remarried over the weekend.  And after intercourse, vagina felt sore and had spotting.  Has not had any spotting since. She did have more rigorous intercourse.  Review of Systems  Genitourinary: Positive for dyspareunia, vaginal bleeding and vaginal pain. Negative for decreased urine volume, difficulty urinating, dysuria, enuresis, flank pain, frequency, genital sores, hematuria, menstrual problem, pelvic pain, urgency and vaginal discharge.  All other systems reviewed and are negative.    ROS: See pertinent positives and negatives per HPI.  Patient Active Problem List   Diagnosis Date Noted  . Post-menopausal bleeding 04/03/2019  . Snoring 02/20/2019  . HSV (herpes simplex virus) anogenital infection 08/31/2018  . Chronic constipation 05/31/2018  . Screening examination for STD (sexually transmitted disease) 05/10/2018  . HSV-2 infection 05/10/2018  . Eczema 01/09/2016  . Dandruff 07/20/2015  . GERD (gastroesophageal reflux disease)-probable paroxsysmal relaxation LES 03/14/2012  . Hair loss 06/29/2011  . GERD 02/28/2011  . ALLERGIC RHINITIS 03/11/2010  . ENDOMETRIOSIS 03/06/2008  . VARICOSE VEINS, LOWER EXTREMITIES 01/08/2008  . INTESTINAL VOLVULUS, LARGE BOWEL 01/08/2008  . Anxiety 01/07/2008  . IRRITABLE BOWEL SYNDROME 01/07/2008  . MIGRAINES, HX OF 01/07/2008  . ATRIAL SEPTAL DEFECT - Status  Post Repair 01/08/1988    Social History   Tobacco Use  . Smoking status: Former Smoker    Packs/day: 1.00    Years: 10.00    Pack years: 10.00    Types: Cigarettes    Last attempt to quit: 12/12/1988    Years since quitting: 30.3  . Smokeless tobacco: Never Used  Substance Use Topics  . Alcohol use: Yes    Comment: social use/wine    Current Outpatient Medications:  .  Ascorbic Acid (VITA-C PO), Take by mouth., Disp: , Rfl:  .  b complex vitamins capsule, Take 1 capsule by mouth daily., Disp: , Rfl:  .  Calcium Carb-Cholecalciferol (CALTRATE 600+D) 600-800 MG-UNIT TABS, Take 1 tablet by mouth 2 (two) times daily., Disp: , Rfl:  .  cycloSPORINE (RESTASIS) 0.05 % ophthalmic emulsion, 1 drop 2 (two) times daily., Disp: , Rfl:  .  dexlansoprazole (DEXILANT) 60 MG capsule, Take 1 capsule (60 mg total) by mouth daily., Disp: 90 capsule, Rfl: 0 .  EPIPEN 2-PAK 0.3 MG/0.3ML SOAJ injection, , Disp: , Rfl:  .  FLUoxetine (PROZAC) 20 MG capsule, TAKE 1 CAPSULE BY MOUTH EVERY DAY, Disp: 90 capsule, Rfl: 0 .  fluticasone (FLONASE) 50 MCG/ACT nasal spray, instill 2 sprays into each nostril once daily, Disp: 16 g, Rfl: 1 .  levocetirizine (XYZAL) 5 MG tablet, , Disp: , Rfl:  .  linaclotide (LINZESS) 145 MCG CAPS capsule, Take 1 capsule (145 mcg total) by mouth daily before breakfast., Disp: 30 capsule, Rfl: 6 .  magnesium oxide (MAG-OX) 400 MG tablet, Take 400 mg by mouth daily., Disp: , Rfl:  .  montelukast (SINGULAIR) 10 MG tablet, , Disp: , Rfl:  .  Multiple Vitamin (MULTIVITAMINS PO), Take 1 tablet by  mouth daily.  , Disp: , Rfl:  .  ondansetron (ZOFRAN-ODT) 4 MG disintegrating tablet, Take 1 tablet (4 mg total) by mouth every 8 (eight) hours as needed for nausea or vomiting., Disp: 15 tablet, Rfl: 0 .  polyethylene glycol (MIRALAX / GLYCOLAX) packet, Take 17 g by mouth daily., Disp: , Rfl:  .  valACYclovir (VALTREX) 500 MG tablet, Take 1 tablet (500 mg total) by mouth daily., Disp: 90 tablet,  Rfl: 3  Allergies  Allergen Reactions  . Chlordiazepoxide-Clidinium     "loopy"  . Codeine     REACTION: nausea,vomitting and dizziness  . Penicillins     REACTION: hives ---taken as a child    Objective:   VITALS: Per patient if applicable, see vitals. GENERAL: Alert, appears well and in no acute distress. HEENT: Atraumatic, conjunctiva clear, no obvious abnormalities on inspection of external nose and ears. NECK: Normal movements of the head and neck. CARDIOPULMONARY: No increased WOB. Speaking in clear sentences. I:E ratio WNL.  MS: Moves all visible extremities without noticeable abnormality. PSYCH: Pleasant and cooperative, well-groomed. Speech normal rate and rhythm. Affect is appropriate. Insight and judgement are appropriate. Attention is focused, linear, and appropriate.  NEURO: CN grossly intact. Oriented as arrived to appointment on time with no prompting. Moves both UE equally.  SKIN: No obvious lesions, wounds, erythema, or cyanosis noted on face or hands.  Assessment and Plan:   There are no diagnoses linked to this encounter.  . Reviewed expectations re: course of current medical issues. . Discussed self-management of symptoms. . Outlined signs and symptoms indicating need for more acute intervention. . Patient verbalized understanding and all questions were answered. [name] Health Maintenance issues including appropriate healthy diet, exercise, and smoking avoidance were discussed with patient. . See orders for this visit as documented in the electronic medical record.  Arnette Norris, MD 04/04/2019

## 2019-04-08 DIAGNOSIS — J3089 Other allergic rhinitis: Secondary | ICD-10-CM | POA: Diagnosis not present

## 2019-04-08 NOTE — Telephone Encounter (Signed)
Pt called this morning stating she had not received the e-mail for the virtual visit. She asked for the e-mail to be send to a different e-mail. I resend the e-mail to [email or web address]. I stayed on the phone with the pt to make sure she was able to received the e-mail. Pt verbalized she did received it.

## 2019-04-09 ENCOUNTER — Encounter: Payer: Self-pay | Admitting: Neurology

## 2019-04-09 ENCOUNTER — Other Ambulatory Visit: Payer: Self-pay

## 2019-04-09 ENCOUNTER — Ambulatory Visit (INDEPENDENT_AMBULATORY_CARE_PROVIDER_SITE_OTHER): Payer: 59 | Admitting: Neurology

## 2019-04-09 DIAGNOSIS — R351 Nocturia: Secondary | ICD-10-CM | POA: Diagnosis not present

## 2019-04-09 DIAGNOSIS — R51 Headache: Secondary | ICD-10-CM

## 2019-04-09 DIAGNOSIS — R0683 Snoring: Secondary | ICD-10-CM

## 2019-04-09 DIAGNOSIS — R0681 Apnea, not elsewhere classified: Secondary | ICD-10-CM

## 2019-04-09 DIAGNOSIS — R519 Headache, unspecified: Secondary | ICD-10-CM

## 2019-04-09 NOTE — Progress Notes (Signed)
Star Age, MD, PhD [facility] [address] [place], [place] [address]   Virtual Visit via Video Note on 04/09/2019:  I connected with [name] on 04/09/19 at  3:00 PM EDT by a video enabled telemedicine application and verified that I am speaking with the correct person using two identifiers.   I discussed the limitations of evaluation and management by telemedicine and the availability of in person appointments. The patient expressed understanding and agreed to proceed.  History of Present Illness: [name] is a 58 year old right-handed woman with an underlying medical history of irritable bowel syndrome, migraines, reflux disease, endometriosis, anxiety, depression, back pain, and allergic rhinitis, with whom I am conducting a virtual, video based new patient visit via Webex in lieu of a face-to-face visit for evaluation of her sleep disorder, in particular, concern for underlying obstructive sleep apnea. The patient is unaccompanied today and joins via cell phone from her workplace, she is alone in her office. I am in my office. She is referred by her primary care physician, Dr. Arnette Norris and I reviewed her office note from 02/20/2019. The patient reports snoring and witnessed apneas per husband's report. She recently got married. Her husband currently sleeps in a separate bedroom because of her loud snoring. She is also at times of restless sleeper and has woken herself up with a jerk in her legs. She has very occasional restless leg symptoms. She has been Zinc quite a bit of caffeine to stay alert and energized during the day. She drinks about 3 cups of coffee in the morning and then 2 additional cups of either coffee or green tea during the day. Bedtime is around 10:30 and rise time between 5:30 and 6. She is still going into work. She works for a Audiological scientist, coordination in Radio broadcast assistant. She is not aware  of any family history of OSA, she did not have a tonsillectomy but a couple of years ago had a deviated septum repair. She had an ASD repair in the past in 1990 as well. She has no inside pets, she has outside cats. She does not watch TV in her bedroom. She quit smoking at age 61 after about 10 years of smoking. She lives with her husband. She exercises on a regular basis.   Her Epworth sleepiness score is 7 out of 24, fatigue severity score is 22 out of 63. She has nocturia about twice per average night and has woken up with the occasional morning headache.   Her Past Medical History Is Significant For: Past Medical History:  Diagnosis Date   Allergic rhinitis    Anxiety    Back pain    Contact lens/glasses fitting    Depression    Endometriosis    Fatigue    GERD (gastroesophageal reflux disease)    Hiatal hernia    History of cardiac catheterization 12/1990   History of Ostium Secundum Atrial Septal Defect 1989   Status post repair in 1989/1990   Hx of migraines    IBS (irritable bowel syndrome)    Incontinence    Intestinal volvulus [facility]) May 2007   Loss of appetite    Onychomycosis    Varicose veins     Her Past Surgical History Is Significant For: Past Surgical History:  Procedure Laterality Date   ABDOMINAL HYSTERECTOMY  2004   ASD REPAIR, SECUNDUM  1990   Dr. Jackie Plum SINUPLASTY Left  EYE SURGERY     muscle reattachment 02/2017   hysterectomy for severe endometriosis     NASAL SEPTUM SURGERY     Dr. Georgia Lopes   OPEN REDUCTION INTERNAL FIXATION (ORIF) DISTAL RADIAL FRACTURE Right 06/20/2017   Procedure: OPEN REDUCTION INTERNAL FIXATION (ORIF) DISTAL RADIAL FRACTURE;  Surgeon: Leanora Cover, MD;  Location: [place];  Service: Orthopedics;  Laterality: Right;   right ankle surgery  2010   right leg fracture with surgery     done with ankle surgery   TRANSTHORACIC ECHOCARDIOGRAM   January 2012   Normal LV size and  function, EF greater than 55%. Mild LA dilation. No intra-atrial shunt with bubble study. Normal pulmonary pressures. Only trace to mild mitral and tricuspid regurgitation.   varicose laser vein surgery  2009    Her Family History Is Significant For: Family History  Problem Relation Age of Onset   Diabetes Mother    Colon cancer Paternal Grandfather    Heart disease Maternal Grandfather    Prostate cancer Maternal Grandfather    Lung cancer Maternal Grandmother        with mets to the brain   Clotting disorder Maternal Grandmother    Clotting disorder Sister    Heart disease Sister     Her Social History Is Significant For: Social History   Socioeconomic History   Marital status: Married    Spouse name: Richard   Number of children: Not on file   Years of education: Not on file   Highest education level: Not on file  Occupational History   Occupation: Scientist, clinical (histocompatibility and immunogenetics): [employer]: Not on file   Food insecurity:    Worry: Not on file    Inability: Not on file   Transportation needs:    Medical: Not on file    Non-medical: Not on file  Tobacco Use   Smoking status: Former Smoker    Packs/day: 1.00    Years: 10.00    Pack years: 10.00    Types: Cigarettes    Last attempt to quit: 12/12/1988    Years since quitting: 30.3   Smokeless tobacco: Never Used  Substance and Sexual Activity   Alcohol use: Yes    Comment: social use/wine   Drug use: No   Sexual activity: Not on file  Lifestyle   Physical activity:    Days per week: Not on file    Minutes per session: Not on file   Stress: Not on file  Relationships   Social connections:    Talks on phone: Not on file    Gets together: Not on file    Attends religious service: Not on file    Active member of club or organization: Not on file    Attends meetings of clubs or organizations: Not on file    Relationship status: Not on file    Other Topics Concern   Not on file  Social History Narrative   They have 2 children and at least two grandchildren. She does all her activities of daily living. She works as a Midwife for [employer].     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 She does other days of the gym and she is able to at least 60 minutes a day 5 days a week.  She is a former smoker who quit in 1990. This was after smoking a pack a day for 10 years. She drinks social alcohol on occasion.    Her Allergies Are:  Allergies  Allergen Reactions   Chlordiazepoxide-Clidinium     "loopy"   Codeine     REACTION: nausea,vomitting and dizziness   Penicillins     REACTION: hives ---taken as a child  :   Her Current Medications Are:  Outpatient Encounter Medications as of 04/09/2019  Medication Sig   Ascorbic Acid (VITA-C PO) Take by mouth.   b complex vitamins capsule Take 1 capsule by mouth daily.   Calcium Carb-Cholecalciferol (CALTRATE 600+D) 600-800 MG-UNIT TABS Take 1 tablet by mouth 2 (two) times daily.   cycloSPORINE (RESTASIS) 0.05 % ophthalmic emulsion 1 drop 2 (two) times daily.   dexlansoprazole (DEXILANT) 60 MG capsule Take 1 capsule (60 mg total) by mouth daily.   EPIPEN 2-PAK 0.3 MG/0.3ML SOAJ injection    FLUoxetine (PROZAC) 20 MG capsule TAKE 1 CAPSULE BY MOUTH EVERY DAY   fluticasone (FLONASE) 50 MCG/ACT nasal spray instill 2 sprays into each nostril once daily   levocetirizine (XYZAL) 5 MG tablet    linaclotide (LINZESS) 145 MCG CAPS capsule Take 1 capsule (145 mcg total) by mouth daily before breakfast.   magnesium oxide (MAG-OX) 400 MG tablet Take 400 mg by mouth daily.   montelukast (SINGULAIR) 10 MG tablet    Multiple Vitamin (MULTIVITAMINS PO) Take 1 tablet by mouth daily.     ondansetron (ZOFRAN-ODT) 4 MG disintegrating tablet Take 1 tablet (4 mg total) by mouth every 8  (eight) hours as needed for nausea or vomiting.   polyethylene glycol (MIRALAX / GLYCOLAX) packet Take 17 g by mouth daily.   valACYclovir (VALTREX) 500 MG tablet Take 1 tablet (500 mg total) by mouth daily.   No facility-administered encounter medications on file as of 04/09/2019.   :   Review of Systems:  Out of a complete 14 point review of systems, all are reviewed and negative with the exception of these symptoms as listed below:  Observations/Objective: Her most recent vital signs on file for my review are from 02/20/2019: Blood pressure 92/60 with pulse of 60, temperature 98.5, weight 178.6 pounds for BMI of 24.9.      Her neck circumference by self-report is 13.5 inches. On examination, she is in no acute distress, pleasant and conversant. Face is symmetric with normal facial animation and perhaps intermittent motor tics around the lower face and mouth area. Speech is clear without dysarthria, hypophonia or voice tremor. Airway examination reveals a smaller airway entry, slightly longer appearing uvula but otherwise unremarkable findings, Mallampati class II. Tongue protrudes centrally and palate elevates symmetrically, tonsils on the smaller side. Hearing is grossly intact. Extraocular movements are well preserved without nystagmus noted. She has no gaze limitations. She has no deviation in her eye movements. Upper body coordination is unremarkable. She stands without difficulty, Romberg is negative, she walks without difficulty.  Assessment and Plan: Ms. Arlena Marsan is a 58 year old right-handed woman with an underlying medical history of irritable bowel syndrome, migraines, reflux disease, endometriosis, anxiety, depression, back pain, and allergic rhinitis, with whom I am conducting a virtual, video based new patient visit via Webex in lieu of a face-to-face visit for evaluation of an underlying organic sleep disorder, in particular, concern for obstructive sleep apnea. The patient's  medical history and physical exam (albeit limited with current video-based evaluation) are concerning for a diagnosis of obstructive  sleep apnea. I discussed with the patient the diagnosis of OSA, its prognosis and treatment options. I explained in particular the risks and ramifications of untreated moderate to severe OSA, especially with respect to developing cardiovascular disease down the Road, including congestive heart failure, difficult to treat hypertension, cardiac arrhythmias, or stroke. Even type 2 diabetes has, in part, been linked to untreated OSA. Symptoms of untreated OSA may include daytime sleepiness, memory problems, mood irritability and mood disorder such as depression and anxiety, lack of energy, as well as recurrent headaches, especially morning headaches. We talked about the importance of weight control. We talked about the importance of maintaining good sleep hygiene. I recommended the following at this time: home sleep test.  I explained the sleep test procedure to the patient and also outlined possible treatment options of OSA, including the use of a custom-made dental device (which would require a referral to a specialist dentist), upper airway surgical options, (such as UPPP, which would involve a referral to an ENT). I also explained the CPAP vs. AutoPAP treatment option to the patient, who indicated that she would be willing to try CPAP if the need arises. I answered all her questions today and the patient was in agreement. I plan to see the patient back after the sleep study is completed and encouraged her to call with any interim questions, concerns, problems or updates.   Star Age, MD, PhD    Follow Up Instructions:  1. HST, sleep lab staff will reach out to patient to arrange for either sending the unit to home address or a "drive by pickup" and for tutorial, making sure patient is comfortable with the unit and usage, and return of equipment, if necessary.  2. Consider  AutoPap therapy, if home sleep test positive for obstructive sleep apnea, patient agreeable. 3. We talked about alternative treatment options and current limitations, due to virus pandemic.  4. Follow-up after starting AutoPap therapy, follow-up to be scheduled according to set-up date, typically within 31 to 89 days post treatment start. 5. Pursue healthy lifestyle, good sleep hygiene, healthy weight. 6. Call for any interim questions or concerns.   I discussed the assessment and treatment plan with the patient. The patient was provided an opportunity to ask questions and all were answered. The patient agreed with the plan and demonstrated an understanding of the instructions.   The patient was advised to call back or seek an in-person evaluation if the symptoms worsen or if the condition fails to improve as anticipated.  I provided 30 minutes of non-face-to-face time during this encounter.   Star Age, MD

## 2019-04-09 NOTE — Patient Instructions (Signed)
Given verbally, during today's virtual video-based encounter, with verbal feedback received.   

## 2019-04-18 DIAGNOSIS — J3089 Other allergic rhinitis: Secondary | ICD-10-CM | POA: Diagnosis not present

## 2019-04-24 ENCOUNTER — Encounter: Payer: 59 | Admitting: Family Medicine

## 2019-05-08 ENCOUNTER — Telehealth: Payer: Self-pay

## 2019-05-08 MED ORDER — DEXLANSOPRAZOLE 60 MG PO CPDR: 1.0000 | DELAYED_RELEASE_CAPSULE | Freq: Every day | ORAL | 0 refills | Status: DC

## 2019-05-08 NOTE — Telephone Encounter (Signed)
Copied from [place] [phone]. Topic: Quick Communication - Rx Refill/Question >> May 08, 2019  7:33 AM Carolyn Stare wrote: Medication dexlansoprazole (DEXILANT) 60 MG capsule   Preferred Pharmacy  [place]  Agent: Please be advised that RX refills may take up to 3 business days. We ask that you follow-up with your pharmacy.

## 2019-05-08 NOTE — Addendum Note (Signed)
Addended by: Rodrigo Ran on: 05/08/2019 08:08 AM   Modules accepted: Orders

## 2019-05-08 NOTE — Telephone Encounter (Signed)
Rx sent 

## 2019-05-20 ENCOUNTER — Ambulatory Visit (INDEPENDENT_AMBULATORY_CARE_PROVIDER_SITE_OTHER): Payer: 59 | Admitting: Neurology

## 2019-05-20 ENCOUNTER — Other Ambulatory Visit: Payer: Self-pay

## 2019-05-20 DIAGNOSIS — G4733 Obstructive sleep apnea (adult) (pediatric): Secondary | ICD-10-CM | POA: Diagnosis not present

## 2019-05-20 DIAGNOSIS — R519 Headache, unspecified: Secondary | ICD-10-CM

## 2019-05-20 DIAGNOSIS — R0681 Apnea, not elsewhere classified: Secondary | ICD-10-CM

## 2019-05-20 DIAGNOSIS — R0683 Snoring: Secondary | ICD-10-CM

## 2019-05-20 DIAGNOSIS — R351 Nocturia: Secondary | ICD-10-CM

## 2019-06-02 ENCOUNTER — Other Ambulatory Visit: Payer: Self-pay | Admitting: Family Medicine

## 2019-06-03 ENCOUNTER — Telehealth: Payer: Self-pay

## 2019-06-03 NOTE — Progress Notes (Signed)
Patient referred by Dr. Marjory Lies, seen by me on 04/09/19 for VV, HST on 05/20/19.    Please call and notify the patient that the recent home sleep test showed obstructive sleep apnea. OSA is overall mild, but may be worth treating to see if she feels better after treatment, may alleviate her snoring (which was mostly intermittent and seemed in the mild range). We can initiate treatment at home in the form of autoPAP, which means, that we don't have to bring her in for a sleep study with CPAP, but will let her try an autoPAP machine at home, through a DME company (of her choice, or as per insurance requirement). The DME representative will educate her on how to use the machine, how to put the mask on, etc. I have not placed an order in the chart. Please let me know, if she would like to proceed with autoPAP. Another Rx option may be an oral appliance, she can talk with her dentist or we can make a referral if she would like.   Star Age, MD, PhD [facility])

## 2019-06-03 NOTE — Telephone Encounter (Signed)
I called pt to discuss her sleep study results. No answer, left a message asking her to call me back. 

## 2019-06-03 NOTE — Procedures (Signed)
Patient Information     First Name: Candace Last Name: Decie Lawson: [id number]  Birth Date: 1961/01/29 Age: 58 Gender: Female  Referring Provider: Lucille Passy, MD BMI: 25.0 (W=178 lb, H=5' 11'')  Neck Circ.:  14 '' Epworth:  7/24   Sleep Study Information    Study Date: May 20, 2019 S/H/A Version: [id number] / 4.1.1528 / 60  History: 58 year old right-handed woman with a history of irritable bowel syndrome, migraines, reflux disease, endometriosis, anxiety, depression, back pain, and allergic rhinitis, who reports snoring and witnessed apneas. Summary & Diagnosis:               Mild OSA Recommendations:      This home sleep test demonstrates overall mild obstructive sleep apnea with a total AHI of 8.3/hour and O2 nadir of 88%. Given the patient's medical history and sleep related complaints, treatment with positive airway pressure can be considered. This can be achieved in the form of autoPAP trial/titration at home. A  full night CPAP titration study may help with proper treatment settings and mask fitting, if needed, down the road. Other treatment options may include weight loss along with avoidance of the supine sleep position, an oral appliance through a qualified dentist, airway surgical procedures (through ENT).  Please note that untreated obstructive sleep apnea may carry additional perioperative morbidity. Patients with significant obstructive sleep apnea should receive perioperative PAP therapy and the surgeons and particularly the anesthesiologist should be informed of the diagnosis and the severity of the sleep disordered breathing. The patient should be cautioned not to drive, work at heights, or operate dangerous or heavy equipment when tired or sleepy. Review and reiteration of good sleep hygiene measures should be pursued with any patient. Other causes of the patient's symptoms, including circadian rhythm disturbances, an underlying mood disorder, medication effect and/or an  underlying medical problem cannot be ruled out based on this test. Clinical correlation is recommended.  The patient and her referring provider will be notified of the test results. The patient will be seen in follow up in sleep clinic at [facility], if needed.   I certify that I have reviewed the raw data recording prior to the issuance of this report in accordance with the standards of the American Academy of Sleep Medicine (AASM).  Star Age, MD, PhD [facility]) Diplomat, ABPN (Neurology and Sleep)             Sleep Summary  Oxygen Saturation Statistics   Start Study Time: End Study Time: Total Recording Time:  10:13:00 PM       5:36:44 AM   7 h, 23 min  Total Sleep Time % REM of Sleep Time:  6 h, 45 min  12.6    Mean: 94 Minimum: 88 Maximum: 99  Mean of Desaturations Nadirs (%):   91  Oxygen Desaturation %:   4-9 10-20 >20 Total  Events Number Total    22  1 95.7 4.3  0 0.0  23 100.0  Oxygen Saturation: <90 <=88 <85 <80 <70  Duration (minutes): Sleep % 0.8 0.2  0.2 0.0  0.1 0.0 0.0 0.0 0.0 0.0     Respiratory Indices      Total Events REM NREM All Night  pRDI:  78  pAHI:  56 ODI:  23  pAHIc:  14  % CSR: 0.0 14.1 7.1 2.4 3.2 11.2 8.5 3.6 2.1 11.6 8.3 3.4 2.2       Pulse Rate Statistics during  Sleep (BPM)      Mean: 69 Minimum: 44 Maximum: 89    Indices are calculated using technically valid sleep time of  6 hrs, 43 min. Central-Indices are calculated using technically valid sleep time of  6  hrs, 27 min. pRDI/pAHI are calculated using oxi desaturations ? 3%  Body Position Statistics  Position Supine Prone Right Left Non-Supine  Sleep (min) 117.0 97.0 163.0 28.0 288.0  Sleep % 28.9 23.9 40.2 6.9 71.1  pRDI 19.0 12.5 5.9 10.8 8.6  pAHI 13.9 8.1 5.2 4.3 6.1  ODI 6.7 3.8 1.1 2.2 2.1     Snoring Statistics Snoring Level (dB) >40 >50 >60 >70 >80 >Threshold (45)  Sleep (min) 29.6 2.6 0.9 0.2 0.0 4.9  Sleep % 7.3 0.6  0.2 0.0 0.0 1.2    Mean: 40 dB Sleep Stages Chart             pAHI=8.3                         Mild              Moderate                    Severe                                                 5              15                    30

## 2019-06-03 NOTE — Telephone Encounter (Signed)
Patient returned call, please call. She will be an apt at 1 and 3. Please call before 1 or tomorrow.

## 2019-06-03 NOTE — Telephone Encounter (Signed)
Noted, nothing further needed.  

## 2019-06-03 NOTE — Telephone Encounter (Signed)
-----   Message from Star Age, MD sent at 06/03/2019  8:23 AM EDT ----- Patient referred by Dr. Marjory Lies, seen by me on 04/09/19 for VV, HST on 05/20/19.    Please call and notify the patient that the recent home sleep test showed obstructive sleep apnea. OSA is overall mild, but may be worth treating to see if she feels better after treatment, may alleviate her snoring (which was mostly intermittent and seemed in the mild range). We can initiate treatment at home in the form of autoPAP, which means, that we don't have to bring her in for a sleep study with CPAP, but will let her try an autoPAP machine at home, through a DME company (of her choice, or as per insurance requirement). The DME representative will educate her on how to use the machine, how to put the mask on, etc. I have not placed an order in the chart. Please let me know, if she would like to proceed with autoPAP. Another Rx option may be an oral appliance, she can talk with her dentist or we can make a referral if she would like.   Star Age, MD, PhD [facility])

## 2019-06-03 NOTE — Telephone Encounter (Signed)
I called pt and discussed her sleep study results. Pt would prefer to start with an oral appliance and actually has already spoken with her dentist. Pt asked that a copy of her sleep study to Dr. Marjory Lies. Pt verbalized understanding of results. Pt had no questions at this time but was encouraged to call back if questions arise.

## 2019-06-25 ENCOUNTER — Telehealth: Payer: Self-pay

## 2019-06-25 DIAGNOSIS — J309 Allergic rhinitis, unspecified: Secondary | ICD-10-CM | POA: Insufficient documentation

## 2019-06-25 NOTE — Assessment & Plan Note (Addendum)
Sleep study confirmed mild OSA.  She chose to try oral appliance fist.  She is picking up that appliance today and will keep me updated.

## 2019-06-25 NOTE — Telephone Encounter (Signed)
Questions for Screening COVID-19  Symptom onset: None  Travel or Contacts: None  During this illness, did/does the patient experience any of the following symptoms? Fever >100.61F []   Yes [x]   No []   Unknown Subjective fever (felt feverish) []   Yes [x]   No []   Unknown Chills []   Yes [x]   No []   Unknown Muscle aches (myalgia) []   Yes [x]   No []   Unknown Runny nose (rhinorrhea) []   Yes [x]   No []   Unknown Sore throat []   Yes [x]   No []   Unknown Cough (new onset or worsening of chronic cough) []   Yes [x]   No []   Unknown Shortness of breath (dyspnea) []   Yes [x]   No []   Unknown Nausea or vomiting []   Yes [x]   No []   Unknown Headache []   Yes [x]   No []   Unknown Abdominal pain  []   Yes [x]   No []   Unknown Diarrhea (?3 loose/looser than normal stools/24hr period) []   Yes [x]   No []   Unknown Other, specify:  Patient risk factors: Smoker? []   Current []   Former []   Never If female, currently pregnant? []   Yes []   No  Patient Active Problem List   Diagnosis Date Noted  . Allergic rhinitis   . OSA (obstructive sleep apnea) 02/20/2019  . HSV (herpes simplex virus) anogenital infection 08/31/2018  . Chronic constipation 05/31/2018  . Screening examination for STD (sexually transmitted disease) 05/10/2018  . HSV-2 infection 05/10/2018  . Eczema 01/09/2016  . GERD (gastroesophageal reflux disease)-probable paroxsysmal relaxation LES 03/14/2012  . Hair loss 06/29/2011  . GERD 02/28/2011  . VARICOSE VEINS, LOWER EXTREMITIES 01/08/2008  . INTESTINAL VOLVULUS, LARGE BOWEL 01/08/2008  . Anxiety 01/07/2008  . IBS (irritable bowel syndrome) 01/07/2008  . MIGRAINES, HX OF 01/07/2008  . ATRIAL SEPTAL DEFECT - Status Post Repair 01/08/1988    Plan:  []   High risk for COVID-19 with red flags go to ED (with CP, SOB, weak/lightheaded, or fever > 101.5). Call ahead.  []   High risk for COVID-19 but stable. Inform provider and coordinate time for [facility] visit.   []   No red flags but URI signs or  symptoms okay for [facility] visit.

## 2019-06-25 NOTE — Progress Notes (Signed)
Subjective:   Patient ID: [name], female    DOB: 1961-11-07, 58 y.o.   MRN: [id number]  [name] is a pleasant 58 y.o. year old female who presents to clinic today with Annual Exam (Pt screened at vehicle.  She is here today for a CPE without PAP.  She is currently fasting. She is scheduled for mammogram in August. Immunizations are UTD.)  on 06/26/2019  HPI:  Health Maintenance  Topic Date Due   INFLUENZA VACCINE  07/13/2019   MAMMOGRAM  07/02/2020   COLONOSCOPY  02/17/2021   PAP SMEAR-Modifier  05/10/2021   TETANUS/TDAP  01/15/2022   Hepatitis C Screening  Completed   HIV Screening  Completed   Recently got married!! Remote h/o hysterectomy. Mammogram due after 07/03/19.[name]  She already has it scheduled for August.  Seeing GYN next month.  Seeing derm in September.   Health Maintenance  Topic Date Due   INFLUENZA VACCINE  07/13/2019   MAMMOGRAM  07/02/2020   COLONOSCOPY  02/17/2021   PAP SMEAR-Modifier  05/10/2021   TETANUS/TDAP  01/15/2022   Hepatitis C Screening  Completed   HIV Screening  Completed   Lab Results  Component Value Date   CHOL 154 05/03/2018   HDL 42.70 05/03/2018   LDLCALC 93 05/03/2018   TRIG 89.0 05/03/2018   CHOLHDL 4 05/03/2018   Lab Results  Component Value Date   NA 140 07/16/2018   K 3.6 07/16/2018   CL 103 07/16/2018   CO2 27 07/16/2018   Lab Results  Component Value Date   WBC 9.1 07/16/2018   HGB 15.1 (H) 07/16/2018   HCT 46.9 (H) 07/16/2018   MCV 93.2 07/16/2018   PLT 268 07/16/2018   Lab Results  Component Value Date   TSH 3.14 05/03/2018   GERD- takes dexilant 60 mg daily since 2012. She has never tried not taking.  Endoscopy done by Dr. Sharlett Iles on 02/18/11- reviewed- did show hiatal hernia and esophagitis.  Does have a significant history of allergic rhinitis. Take xyzal 5 mg nightly, singulair 10 mg daily, allergy shots and flonase daily.  IBS with constipation- taking Linzess.     Last colonoscopy and endoscopy done by Dr. Sharlett Iles on 02/18/11.  Notes reviewed.  Normal colonoscopy 10 year recall.  Anxiety- has been on prozac 20 mg daily for years.  She feels she is ready to decrease the dose as well.  No flowsheet data found.   Depression screen PHQ 2/9 02/20/2019  Decreased Interest 0  Down, Depressed, Hopeless 0  PHQ - 2 Score 0     Snoring- I did refer her for home sleep study, which was done on 05/20/19.  Report reviewed- results by Dr. Rexene Alberts were as follows:  sleep test showed obstructive sleep apnea. OSA is overall mild, but may be worth treating to see if she feels better after treatment, may alleviate her snoring (which was mostly intermittent and seemed in the mild range). We can initiate treatment at home in the form of autoPAP, which means, that we don't have to bring her in for a sleep study with CPAP, but will let her try an autoPAP machine at home, through a DME company (of her choice, or as per insurance requirement). The DME representative will educate her on how to use the machine, how to put the mask on, etc. I have not placed an order in the chart. Please let me know, if she would like to proceed with autoPAP. Another Rx option  may be an oral appliance, she can talk with her dentist or we can make a referral if she would like.   She decided she wanted to try and oral appliance first.  She is picking up the oral appliance today.  Current Outpatient Medications on File Prior to Visit  Medication Sig Dispense Refill   Ascorbic Acid (VITA-C PO) Take by mouth.     b complex vitamins capsule Take 1 capsule by mouth daily.     Calcium Carb-Cholecalciferol (CALTRATE 600+D) 600-800 MG-UNIT TABS Take 1 tablet by mouth 2 (two) times daily.     cycloSPORINE (RESTASIS) 0.05 % ophthalmic emulsion 1 drop 2 (two) times daily.     dexlansoprazole (DEXILANT) 60 MG capsule Take 1 capsule (60 mg total) by mouth daily. 90 capsule 0   EPIPEN 2-PAK 0.3 MG/0.3ML SOAJ  injection      FLUoxetine (PROZAC) 20 MG capsule TAKE 1 CAPSULE BY MOUTH EVERY DAY 90 capsule 0   fluticasone (FLONASE) 50 MCG/ACT nasal spray instill 2 sprays into each nostril once daily 16 g 1   levocetirizine (XYZAL) 5 MG tablet      linaclotide (LINZESS) 145 MCG CAPS capsule Take 1 capsule (145 mcg total) by mouth daily before breakfast. 30 capsule 6   magnesium oxide (MAG-OX) 400 MG tablet Take 400 mg by mouth daily.     montelukast (SINGULAIR) 10 MG tablet      Multiple Vitamin (MULTIVITAMINS PO) Take 1 tablet by mouth daily.       polyethylene glycol (MIRALAX / GLYCOLAX) packet Take 17 g by mouth daily.     No current facility-administered medications on file prior to visit.     Allergies  Allergen Reactions   Chlordiazepoxide-Clidinium     "loopy"   Codeine     REACTION: nausea,vomitting and dizziness   Penicillins     REACTION: hives ---taken as a child    Past Medical History:  Diagnosis Date   Allergic rhinitis    Anxiety    Back pain    Contact lens/glasses fitting    Depression    Endometriosis    Fatigue    GERD (gastroesophageal reflux disease)    Hiatal hernia    History of cardiac catheterization 12/1990   History of Ostium Secundum Atrial Septal Defect 1989   Status post repair in 1989/1990   Hx of migraines    IBS (irritable bowel syndrome)    Incontinence    Intestinal volvulus ([place]) May 2007   Loss of appetite    Onychomycosis    Varicose veins     Past Surgical History:  Procedure Laterality Date   ABDOMINAL HYSTERECTOMY  2004   ASD REPAIR, SECUNDUM  1990   Dr. Jackie Plum SINUPLASTY Left    EYE SURGERY     muscle reattachment 02/2017   hysterectomy for severe endometriosis     NASAL SEPTUM SURGERY     Dr. Georgia Lopes   OPEN REDUCTION INTERNAL FIXATION (ORIF) DISTAL RADIAL FRACTURE Right 06/20/2017   Procedure: OPEN REDUCTION INTERNAL FIXATION (ORIF) DISTAL RADIAL FRACTURE;  Surgeon: Leanora Cover,  MD;  Location: [facility];  Service: Orthopedics;  Laterality: Right;   right ankle surgery  2010   right leg fracture with surgery     done with ankle surgery   TRANSTHORACIC ECHOCARDIOGRAM   January 2012   Normal LV size and function, EF greater than 55%. Mild LA dilation. No intra-atrial shunt with bubble study. Normal pulmonary pressures. Only trace  to mild mitral and tricuspid regurgitation.   varicose laser vein surgery  2009    Family History  Problem Relation Age of Onset   Diabetes Mother    Colon cancer Paternal Grandfather    Heart disease Maternal Grandfather    Prostate cancer Maternal Grandfather    Lung cancer Maternal Grandmother        with mets to the brain   Clotting disorder Maternal Grandmother    Clotting disorder Sister    Heart disease Sister     Social History   Socioeconomic History   Marital status: Married    Spouse name: Richard   Number of children: Not on file   Years of education: Not on file   Highest education level: Not on file  Occupational History   Occupation: Scientist, clinical (histocompatibility and immunogenetics): [employer]: Not on file   Food insecurity    Worry: Not on file    Inability: Not on file   Transportation needs    Medical: Not on file    Non-medical: Not on file  Tobacco Use   Smoking status: Former Smoker    Packs/day: 1.00    Years: 10.00    Pack years: 10.00    Types: Cigarettes    Quit date: 12/12/1988    Years since quitting: 30.5   Smokeless tobacco: Never Used  Substance and Sexual Activity   Alcohol use: Yes    Comment: social use/wine   Drug use: No   Sexual activity: Not on file  Lifestyle   Physical activity    Days per week: Not on file    Minutes per session: Not on file   Stress: Not on file  Relationships   Social connections    Talks on phone: Not on file    Gets together: Not on file    Attends religious service: Not on file     Active member of club or organization: Not on file    Attends meetings of clubs or organizations: Not on file    Relationship status: Not on file   Intimate partner violence    Fear of current or ex partner: Not on file    Emotionally abused: Not on file    Physically abused: Not on file    Forced sexual activity: Not on file  Other Topics Concern   Not on file  Social History Narrative   They have 2 children and at least two grandchildren. She does all her activities of daily living. She works as a Midwife for [employer].     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 She does other days of the gym and she is able to at least 60 minutes a day 5 days a week.   She is a former smoker who quit in 1990. This was after smoking a pack a day for 10 years. She drinks social alcohol on occasion.   The PMH, PSH, Social History, Family History, Medications, and allergies have been reviewed in [facility], and have been updated if relevant.   Review of Systems  Constitutional: Negative.   HENT: Negative.   Eyes: Negative.   Respiratory: Negative.   Cardiovascular: Negative.   Gastrointestinal: Negative.   Endocrine: Negative.   Genitourinary: Negative for decreased urine volume, difficulty urinating, dyspareunia, dysuria, enuresis, flank pain, frequency,  genital sores, hematuria, pelvic pain, urgency, vaginal bleeding, vaginal discharge and vaginal pain.  Musculoskeletal: Negative.   Skin: Negative.   Allergic/Immunologic: Negative.   Neurological: Negative.   Psychiatric/Behavioral: Negative.   All other systems reviewed and are negative.      Objective:    BP 104/64 (BP Location: Left Arm, Patient Position: Sitting, Cuff Size: Normal)    Pulse (!) 54    Temp 98.4 F (36.9 C) (Oral)    Ht 5' 10.75" (1.797 m)    Wt 178 lb 9.6 oz (81 kg)    SpO2 96%    BMI 25.09 kg/m    Physical  Exam    General:  Well-developed,well-nourished,in no acute distress; alert,appropriate and cooperative throughout examination Head:  normocephalic and atraumatic.   Eyes:  vision grossly intact, PERRL Ears:  R ear normal and L ear normal externally, TMs clear bilaterally Nose:  no external deformity.   Mouth:  good dentition.   Neck:  No deformities, masses, or tenderness noted. Breasts:  No mass, nodules, thickening, tenderness, bulging, retraction, inflamation, nipple discharge or skin changes noted.   Lungs:  Normal respiratory effort, chest expands symmetrically. Lungs are clear to auscultation, no crackles or wheezes. Heart:  Normal rate and regular rhythm. S1 and S2 normal without gallop, murmur, click, rub or other extra sounds. Abdomen:  Bowel sounds positive,abdomen soft and non-tender without masses, organomegaly or hernias noted. Msk:  No deformity or scoliosis noted of thoracic or lumbar spine.   Extremities:  No clubbing, cyanosis, edema, or deformity noted with normal full range of motion of all joints.   Neurologic:  alert & oriented X3 and gait normal.   Skin:  Intact without suspicious lesions or rashes Cervical Nodes:  No lymphadenopathy noted Axillary Nodes:  No palpable lymphadenopathy Psych:  Cognition and judgment appear intact. Alert and cooperative with normal attention span and concentration. No apparent delusions, illusions, hallucinations       Assessment & Plan:   Anxiety - Plan:  Gastroesophageal reflux disease, esophagitis presence not specified - Plan:  Seasonal allergic rhinitis, unspecified trigger - Plan:   Irritable bowel syndrome with constipation - Plan:  Gastroesophageal reflux disease with esophagitis - Plan:  OSA (obstructive sleep apnea) - Plan:   HSV (herpes simplex virus) anogenital infection - Plan: valACYclovir (VALTREX) 500 MG tablet  Screening for breast cancer - Plan: MM 3D SCREEN BREAST BILATERAL,  No follow-ups on file.

## 2019-06-25 NOTE — Assessment & Plan Note (Signed)
With endoscopy proven h/o esophagitis. Taking Delixant daily.

## 2019-06-25 NOTE — Assessment & Plan Note (Signed)
With constipation.  Does take linzess.

## 2019-06-26 ENCOUNTER — Other Ambulatory Visit: Payer: Self-pay

## 2019-06-26 ENCOUNTER — Other Ambulatory Visit: Payer: Self-pay | Admitting: Family Medicine

## 2019-06-26 ENCOUNTER — Encounter: Payer: Self-pay | Admitting: Family Medicine

## 2019-06-26 ENCOUNTER — Ambulatory Visit (INDEPENDENT_AMBULATORY_CARE_PROVIDER_SITE_OTHER): Payer: 59 | Admitting: Family Medicine

## 2019-06-26 VITALS — BP 104/64 | HR 54 | Temp 98.4°F | Ht 70.75 in | Wt 178.6 lb

## 2019-06-26 DIAGNOSIS — F419 Anxiety disorder, unspecified: Secondary | ICD-10-CM | POA: Diagnosis not present

## 2019-06-26 DIAGNOSIS — K2100 Gastro-esophageal reflux disease with esophagitis, without bleeding: Secondary | ICD-10-CM

## 2019-06-26 DIAGNOSIS — Z1239 Encounter for other screening for malignant neoplasm of breast: Secondary | ICD-10-CM | POA: Diagnosis not present

## 2019-06-26 DIAGNOSIS — K581 Irritable bowel syndrome with constipation: Secondary | ICD-10-CM | POA: Diagnosis not present

## 2019-06-26 DIAGNOSIS — Z0000 Encounter for general adult medical examination without abnormal findings: Secondary | ICD-10-CM | POA: Insufficient documentation

## 2019-06-26 DIAGNOSIS — Z13220 Encounter for screening for lipoid disorders: Secondary | ICD-10-CM | POA: Diagnosis not present

## 2019-06-26 DIAGNOSIS — K5909 Other constipation: Secondary | ICD-10-CM | POA: Diagnosis not present

## 2019-06-26 DIAGNOSIS — K219 Gastro-esophageal reflux disease without esophagitis: Secondary | ICD-10-CM

## 2019-06-26 DIAGNOSIS — J302 Other seasonal allergic rhinitis: Secondary | ICD-10-CM | POA: Diagnosis not present

## 2019-06-26 DIAGNOSIS — G4733 Obstructive sleep apnea (adult) (pediatric): Secondary | ICD-10-CM | POA: Diagnosis not present

## 2019-06-26 DIAGNOSIS — A609 Anogenital herpesviral infection, unspecified: Secondary | ICD-10-CM

## 2019-06-26 DIAGNOSIS — D72820 Lymphocytosis (symptomatic): Secondary | ICD-10-CM

## 2019-06-26 LAB — LIPID PANEL
Cholesterol: 162 mg/dL (ref 0–200)
HDL: 47.7 mg/dL (ref >39.00)
LDL Cholesterol: 99 mg/dL (ref 0–99)
NonHDL: 114.69
Total CHOL/HDL Ratio: 3
Triglycerides: 77 mg/dL (ref 0.0–149.0)
VLDL: 15.4 mg/dL (ref 0.0–40.0)

## 2019-06-26 LAB — COMPREHENSIVE METABOLIC PANEL
ALT: 25 U/L (ref 0–35)
AST: 26 U/L (ref 0–37)
Albumin: 4.3 g/dL (ref 3.5–5.2)
Alkaline Phosphatase: 56 U/L (ref 39–117)
BUN: 16 mg/dL (ref 6–23)
CO2: 31 mEq/L (ref 19–32)
Calcium: 9 mg/dL (ref 8.4–10.5)
Chloride: 103 mEq/L (ref 96–112)
Creatinine, Ser: 0.74 mg/dL (ref 0.40–1.20)
GFR: 80.15 mL/min (ref >60.00)
Glucose, Bld: 98 mg/dL (ref 70–99)
Potassium: 3.8 mEq/L (ref 3.5–5.1)
Sodium: 140 mEq/L (ref 135–145)
Total Bilirubin: 1 mg/dL (ref 0.2–1.2)
Total Protein: 6.6 g/dL (ref 6.0–8.3)

## 2019-06-26 LAB — CBC WITH DIFFERENTIAL/PLATELET
Basophils Absolute: 0.1 10*3/uL (ref 0.0–0.1)
Basophils Relative: 0.8 % (ref 0.0–3.0)
Eosinophils Absolute: 0.2 10*3/uL (ref 0.0–0.7)
Eosinophils Relative: 2.8 % (ref 0.0–5.0)
HCT: 42.2 % (ref 36.0–46.0)
Hemoglobin: 14 g/dL (ref 12.0–15.0)
Lymphocytes Relative: 54.6 % — ABNORMAL HIGH (ref 12.0–46.0)
Lymphs Abs: 4.8 10*3/uL — ABNORMAL HIGH (ref 0.7–4.0)
MCHC: 33.2 g/dL (ref 30.0–36.0)
MCV: 93.8 fl (ref 78.0–100.0)
Monocytes Absolute: 0.7 10*3/uL (ref 0.1–1.0)
Monocytes Relative: 7.8 % (ref 3.0–12.0)
Neutro Abs: 3 10*3/uL (ref 1.4–7.7)
Neutrophils Relative %: 34 % — ABNORMAL LOW (ref 43.0–77.0)
Platelets: 292 10*3/uL (ref 150.0–400.0)
RBC: 4.51 Mil/uL (ref 3.87–5.11)
RDW: 13.8 % (ref 11.5–15.5)
WBC: 8.8 10*3/uL (ref 4.0–10.5)

## 2019-06-26 LAB — TSH: TSH: 2.49 u[IU]/mL (ref 0.35–4.50)

## 2019-06-26 MED ORDER — FLUOXETINE HCL 10 MG PO CAPS: 10.0000 mg | ORAL_CAPSULE | Freq: Every day | ORAL | 3 refills | Status: DC

## 2019-06-26 MED ORDER — DEXILANT 30 MG PO CPDR: 30.0000 mg | DELAYED_RELEASE_CAPSULE | Freq: Every day | ORAL | 3 refills | Status: DC

## 2019-06-26 MED ORDER — VALACYCLOVIR HCL 500 MG PO TABS: 500.0000 mg | ORAL_TABLET | Freq: Every day | ORAL | 3 refills | Status: DC

## 2019-06-26 NOTE — Addendum Note (Signed)
Addended by: Lucille Passy on: 06/26/2019 06:06 PM   Modules accepted: Orders

## 2019-06-26 NOTE — Progress Notes (Signed)
  Addendum- Since CBC showed lymphocytosis with neutropenia, advised pt to schedule follow up labs in 2 months through result note.  Order entered.

## 2019-06-26 NOTE — Assessment & Plan Note (Signed)
Reviewed preventive care protocols, scheduled due services, and updated immunizations Discussed nutrition, exercise, diet, and healthy lifestyle.  

## 2019-06-26 NOTE — Patient Instructions (Addendum)
Great to see you. I will call you with your lab results from today and you can view them online.   We are decreasing your dose of dexilant to 30 mg daily  Also try to take the 30 mg of dexilant every other day as perhaps we can wean off it.  We also decreased prozac from 20 mg daily to 10 mg daily. Please update me in a few weeks, sooner if have any  concerns.  Ask your gynecologist about pelvic floor physical therapy.   Food Choices for Gastroesophageal Reflux Disease, Adult When you have gastroesophageal reflux disease (GERD), the foods you eat and your eating habits are very important. Choosing the right foods can help ease your discomfort. Think about working with a nutrition specialist (dietitian) to help you make good choices. What are tips for following this plan?  Meals  Choose healthy foods that are low in fat, such as fruits, vegetables, whole grains, low-fat dairy products, and lean meat, fish, and poultry.  Eat small meals often instead of 3 large meals a day. Eat your meals slowly, and in a place where you are relaxed. Avoid bending over or lying down until 2-3 hours after eating.  Avoid eating meals 2-3 hours before bed.  Avoid drinking a lot of liquid with meals.  Cook foods using methods other than frying. Bake, grill, or broil food instead.  Avoid or limit: ? Chocolate. ? Peppermint or spearmint. ? Alcohol. ? Pepper. ? Black and decaffeinated coffee. ? Black and decaffeinated tea. ? Bubbly (carbonated) soft drinks. ? Caffeinated energy drinks and soft drinks.  Limit high-fat foods such as: ? Fatty meat or fried foods. ? Whole milk, cream, butter, or ice cream. ? Nuts and nut butters. ? Pastries, donuts, and sweets made with butter or shortening.  Avoid foods that cause symptoms. These foods may be different for everyone. Common foods that cause symptoms include: ? Tomatoes. ? Oranges, lemons, and limes. ? Peppers. ? Spicy food. ? Onions and  garlic. ? Vinegar. Lifestyle  Maintain a healthy weight. Ask your doctor what weight is healthy for you. If you need to lose weight, work with your doctor to do so safely.  Exercise for at least 30 minutes for 5 or more days each week, or as told by your doctor.  Wear loose-fitting clothes.  Do not smoke. If you need help quitting, ask your doctor.  Sleep with the head of your bed higher than your feet. Use a wedge under the mattress or blocks under the bed frame to raise the head of the bed. Summary  When you have gastroesophageal reflux disease (GERD), food and lifestyle choices are very important in easing your symptoms.  Eat small meals often instead of 3 large meals a day. Eat your meals slowly, and in a place where you are relaxed.  Limit high-fat foods such as fatty meat or fried foods.  Avoid bending over or lying down until 2-3 hours after eating.  Avoid peppermint and spearmint, caffeine, alcohol, and chocolate. This information is not intended to replace advice given to you by your health care provider. Make sure you discuss any questions you have with your health care provider. Document Released: 05/29/2012 Document Revised: 03/21/2019 Document Reviewed: 01/03/2017 Elsevier Patient Education  2020 [employer].

## 2019-06-26 NOTE — Assessment & Plan Note (Signed)
Linzess is working well. No changes made today.

## 2019-06-26 NOTE — Assessment & Plan Note (Addendum)
Remains on Dexilant 60 mg daily.  I explained this is a high dose to take for so long and she was open to decreasing dose to 30 mg daily (eRX sent).  She was also open to trying to take the 30 mg of dexilant every other day as perhaps we can wean off it.

## 2019-06-26 NOTE — Assessment & Plan Note (Signed)
Improving.  She is okay with trying lower dose prozac- we decreased prozac from 20 mg daily to 10 mg daily. She will update me in a few weeks, sooner if she has any concerns. The patient indicates understanding of these issues and agrees with the plan.

## 2019-06-26 NOTE — Assessment & Plan Note (Signed)
Biggest trigger is mold- she receives allergy shots, takes  xyzal 5 mg nightly, singulair 10 mg daily and flonase daily as well.

## 2019-06-27 ENCOUNTER — Other Ambulatory Visit: Payer: Self-pay | Admitting: Family Medicine

## 2019-07-25 ENCOUNTER — Encounter: Payer: Self-pay | Admitting: Family Medicine

## 2019-07-29 ENCOUNTER — Other Ambulatory Visit: Payer: Self-pay | Admitting: Internal Medicine

## 2019-08-01 NOTE — Telephone Encounter (Signed)
Patient called and schedule appt for virtual visit. It was schedule on 08/05/2019 with Dr. Deborra Medina but I called patient back due to appt not schedule on the day Dr. Deborra Medina will be working virtually. Patient states that after taking to her husband that she did not want to go forward with getting the vaccine for COVID-19. She did cancel the appt on 08/05/2019

## 2019-08-05 ENCOUNTER — Ambulatory Visit: Payer: 59 | Admitting: Family Medicine

## 2019-08-21 ENCOUNTER — Telehealth: Payer: Self-pay

## 2019-08-21 ENCOUNTER — Other Ambulatory Visit: Payer: 59

## 2019-08-21 NOTE — Telephone Encounter (Signed)

## 2019-08-22 ENCOUNTER — Other Ambulatory Visit (INDEPENDENT_AMBULATORY_CARE_PROVIDER_SITE_OTHER): Payer: Managed Care, Other (non HMO)

## 2019-08-22 DIAGNOSIS — D72820 Lymphocytosis (symptomatic): Secondary | ICD-10-CM

## 2019-08-22 LAB — CBC WITH DIFFERENTIAL/PLATELET
Basophils Absolute: 0 10*3/uL (ref 0.0–0.1)
Basophils Relative: 0.3 % (ref 0.0–3.0)
Eosinophils Absolute: 0.2 10*3/uL (ref 0.0–0.7)
Eosinophils Relative: 2.8 % (ref 0.0–5.0)
HCT: 42 % (ref 36.0–46.0)
Hemoglobin: 13.8 g/dL (ref 12.0–15.0)
Lymphocytes Relative: 52.9 % — ABNORMAL HIGH (ref 12.0–46.0)
Lymphs Abs: 4.6 10*3/uL — ABNORMAL HIGH (ref 0.7–4.0)
MCHC: 32.7 g/dL (ref 30.0–36.0)
MCV: 94.9 fl (ref 78.0–100.0)
Monocytes Absolute: 0.8 10*3/uL (ref 0.1–1.0)
Monocytes Relative: 9.4 % (ref 3.0–12.0)
Neutro Abs: 3 10*3/uL (ref 1.4–7.7)
Neutrophils Relative %: 34.6 % — ABNORMAL LOW (ref 43.0–77.0)
Platelets: 303 10*3/uL (ref 150.0–400.0)
RBC: 4.43 Mil/uL (ref 3.87–5.11)
RDW: 13.5 % (ref 11.5–15.5)
WBC: 8.7 10*3/uL (ref 4.0–10.5)

## 2019-08-23 ENCOUNTER — Encounter: Payer: Self-pay | Admitting: Family Medicine

## 2019-08-24 ENCOUNTER — Other Ambulatory Visit: Payer: Self-pay | Admitting: Family Medicine

## 2019-08-26 ENCOUNTER — Telehealth: Payer: Self-pay | Admitting: Family Medicine

## 2019-08-26 NOTE — Telephone Encounter (Signed)

## 2019-08-27 ENCOUNTER — Ambulatory Visit (INDEPENDENT_AMBULATORY_CARE_PROVIDER_SITE_OTHER): Payer: Managed Care, Other (non HMO) | Admitting: Behavioral Health

## 2019-08-27 DIAGNOSIS — Z23 Encounter for immunization: Secondary | ICD-10-CM | POA: Diagnosis not present

## 2019-08-27 NOTE — Progress Notes (Signed)
Patient presents in office today for Influenza vaccination. IM injection was given in the left deltoid. Patient tolerated the injection well. No signs or symptoms of a reaction were noted prior to patient leaving the nurse visit. 

## 2019-08-30 NOTE — Progress Notes (Signed)
Subjective:   Patient ID: [name], female    DOB: 10-26-1961, 58 y.o.   MRN: [id number]  [name] is a pleasant 58 y.o. year old female who presents to clinic today with Discuss lab results  on 09/02/2019  HPI:  Here to discuss labs- Relative neutropenia-  Quit smoking years ago.  Has not been sick recently.  She does take Alleve nightly after she exercises.  We discussed medications she is taking that can cause relative neutrophils to decrease.  She does take NSAIDs regularly.  Also was started on Dexilant years ago by GI prior to anything else.  She does take an antiviral- valtrex but now is in a happy marriage and not stressed so she is not sure that she needs to take it daily for suppression.  Not sure when she had her last outbreak.  Recent Results (from the past 2160 hour(s))  CBC with Differential/Platelet     Status: Abnormal   Collection Time: 06/26/19  9:51 AM  Result Value Ref Range   WBC 8.8 4.0 - 10.5 K/uL   RBC 4.51 3.87 - 5.11 Mil/uL   Hemoglobin 14.0 12.0 - 15.0 g/dL   HCT 42.2 36.0 - 46.0 %   MCV 93.8 78.0 - 100.0 fl   MCHC 33.2 30.0 - 36.0 g/dL   RDW 13.8 11.5 - 15.5 %   Platelets 292.0 150.0 - 400.0 K/uL   Neutrophils Relative % 34.0 (L) 43.0 - 77.0 %   Lymphocytes Relative 54.6 (H) 12.0 - 46.0 %   Monocytes Relative 7.8 3.0 - 12.0 %   Eosinophils Relative 2.8 0.0 - 5.0 %   Basophils Relative 0.8 0.0 - 3.0 %   Neutro Abs 3.0 1.4 - 7.7 K/uL   Lymphs Abs 4.8 (H) 0.7 - 4.0 K/uL   Monocytes Absolute 0.7 0.1 - 1.0 K/uL   Eosinophils Absolute 0.2 0.0 - 0.7 K/uL   Basophils Absolute 0.1 0.0 - 0.1 K/uL  Comprehensive metabolic panel     Status: None   Collection Time: 06/26/19  9:51 AM  Result Value Ref Range   Sodium 140 135 - 145 mEq/L   Potassium 3.8 3.5 - 5.1 mEq/L   Chloride 103 96 - 112 mEq/L   CO2 31 19 - 32 mEq/L   Glucose, Bld 98 70 - 99 mg/dL   BUN 16 6 - 23 mg/dL   Creatinine, Ser 0.74 0.40 - 1.20 mg/dL   Total Bilirubin 1.0  0.2 - 1.2 mg/dL   Alkaline Phosphatase 56 39 - 117 U/L   AST 26 0 - 37 U/L   ALT 25 0 - 35 U/L   Total Protein 6.6 6.0 - 8.3 g/dL   Albumin 4.3 3.5 - 5.2 g/dL   Calcium 9.0 8.4 - 10.5 mg/dL   GFR 80.15 >60.00 mL/min  Lipid panel     Status: None   Collection Time: 06/26/19  9:51 AM  Result Value Ref Range   Cholesterol 162 0 - 200 mg/dL    Comment: ATP III Classification       Desirable:  < 200 mg/dL               Borderline High:  200 - 239 mg/dL          High:  > = 240 mg/dL   Triglycerides 77.0 0.0 - 149.0 mg/dL    Comment: Normal:  <150 mg/dLBorderline High:  150 - 199 mg/dL   HDL 47.70 >39.00 mg/dL   VLDL 15.4  0.0 - 40.0 mg/dL   LDL Cholesterol 99 0 - 99 mg/dL   Total CHOL/HDL Ratio 3     Comment:                Men          Women1/2 Average Risk     3.4          3.3Average Risk          5.0          4.42X Average Risk          9.6          7.13X Average Risk          15.0          11.0                       NonHDL 114.69     Comment: NOTE:  Non-HDL goal should be 30 mg/dL higher than patient's LDL goal (i.e. LDL goal of < 70 mg/dL, would have non-HDL goal of < 100 mg/dL)  TSH     Status: None   Collection Time: 06/26/19  9:51 AM  Result Value Ref Range   TSH 2.49 0.35 - 4.50 uIU/mL  CBC with Differential/Platelet     Status: Abnormal   Collection Time: 08/22/19  8:08 AM  Result Value Ref Range   WBC 8.7 4.0 - 10.5 K/uL   RBC 4.43 3.87 - 5.11 Mil/uL   Hemoglobin 13.8 12.0 - 15.0 g/dL   HCT 42.0 36.0 - 46.0 %   MCV 94.9 78.0 - 100.0 fl   MCHC 32.7 30.0 - 36.0 g/dL   RDW 13.5 11.5 - 15.5 %   Platelets 303.0 150.0 - 400.0 K/uL   Neutrophils Relative % 34.6 (L) 43.0 - 77.0 %   Lymphocytes Relative 52.9 (H) 12.0 - 46.0 %   Monocytes Relative 9.4 3.0 - 12.0 %   Eosinophils Relative 2.8 0.0 - 5.0 %   Basophils Relative 0.3 0.0 - 3.0 %   Neutro Abs 3.0 1.4 - 7.7 K/uL   Lymphs Abs 4.6 (H) 0.7 - 4.0 K/uL   Monocytes Absolute 0.8 0.1 - 1.0 K/uL   Eosinophils Absolute 0.2 0.0 -  0.7 K/uL   Basophils Absolute 0.0 0.0 - 0.1 K/uL     Current Outpatient Medications on File Prior to Visit  Medication Sig Dispense Refill  . Ascorbic Acid (VITA-C PO) Take by mouth.    [name] b complex vitamins capsule Take 1 capsule by mouth daily.    . Calcium Carb-Cholecalciferol (CALTRATE 600+D) 600-800 MG-UNIT TABS Take 1 tablet by mouth 2 (two) times daily.    . cycloSPORINE (RESTASIS) 0.05 % ophthalmic emulsion 1 drop 2 (two) times daily.    [name] EPIPEN 2-PAK 0.3 MG/0.3ML SOAJ injection     . FLUoxetine (PROZAC) 10 MG capsule Take 1 capsule (10 mg total) by mouth daily. 30 capsule 3  . fluticasone (FLONASE) 50 MCG/ACT nasal spray instill 2 sprays into each nostril once daily 16 g 1  . levocetirizine (XYZAL) 5 MG tablet     . LINZESS 145 MCG CAPS capsule TAKE 1 CAPSULE (145 MCG TOTAL) BY MOUTH DAILY BEFORE BREAKFAST. 90 capsule 1  . magnesium oxide (MAG-OX) 400 MG tablet Take 400 mg by mouth daily.    . montelukast (SINGULAIR) 10 MG tablet     . Multiple Vitamin (MULTIVITAMINS PO) Take 1 tablet by mouth daily.      [name]  polyethylene glycol (MIRALAX / GLYCOLAX) packet Take 17 g by mouth daily.    . valACYclovir (VALTREX) 500 MG tablet Take 1 tablet (500 mg total) by mouth daily. 90 tablet 3   No current facility-administered medications on file prior to visit.     Allergies  Allergen Reactions  . Chlordiazepoxide-Clidinium     "loopy"  . Codeine     REACTION: nausea,vomitting and dizziness  . Penicillins     REACTION: hives ---taken as a child    Past Medical History:  Diagnosis Date  . Allergic rhinitis   . Anxiety   . Back pain   . Contact lens/glasses fitting   . Depression   . Endometriosis   . Fatigue   . GERD (gastroesophageal reflux disease)   . Hiatal hernia   . History of cardiac catheterization 12/1990  . History of Ostium Secundum Atrial Septal Defect 1989   Status post repair in 1989/1990  . Hx of migraines   . IBS (irritable bowel syndrome)   . Incontinence    . Intestinal volvulus [facility]) May 2007  . Loss of appetite   . Onychomycosis   . Varicose veins     Past Surgical History:  Procedure Laterality Date  . ABDOMINAL HYSTERECTOMY  2004  . ASD REPAIR, SECUNDUM  1990   Dr. Servando Snare  . BALLOON SINUPLASTY Left   . EYE SURGERY     muscle reattachment 02/2017  . hysterectomy for severe endometriosis    . NASAL SEPTUM SURGERY     Dr. Georgia Lopes  . OPEN REDUCTION INTERNAL FIXATION (ORIF) DISTAL RADIAL FRACTURE Right 06/20/2017   Procedure: OPEN REDUCTION INTERNAL FIXATION (ORIF) DISTAL RADIAL FRACTURE;  Surgeon: Leanora Cover, MD;  Location: [place] [facility];  Service: Orthopedics;  Laterality: Right;  . right ankle surgery  2010  . right leg fracture with surgery     done with ankle surgery  . TRANSTHORACIC ECHOCARDIOGRAM   January 2012   Normal LV size and function, EF greater than 55%. Mild LA dilation. No intra-atrial shunt with bubble study. Normal pulmonary pressures. Only trace to mild mitral and tricuspid regurgitation.  . varicose laser vein surgery  2009    Family History  Problem Relation Age of Onset  . Diabetes Mother   . Colon cancer Paternal Grandfather   . Heart disease Maternal Grandfather   . Prostate cancer Maternal Grandfather   . Lung cancer Maternal Grandmother        with mets to the brain  . Clotting disorder Maternal Grandmother   . Clotting disorder Sister   . Heart disease Sister     Social History   Socioeconomic History  . Marital status: Married    Spouse name: Richard  . Number of children: Not on file  . Years of education: Not on file  . Highest education level: Not on file  Occupational History  . Occupation: Scientist, clinical (histocompatibility and immunogenetics): [employer]  . Financial resource strain: Not on file  . Food insecurity    Worry: Not on file    Inability: Not on file  . Transportation needs    Medical: Not on file    Non-medical: Not on file  Tobacco Use  . Smoking status:  Former Smoker    Packs/day: 1.00    Years: 10.00    Pack years: 10.00    Types: Cigarettes    Quit date: 12/12/1988    Years since quitting: 30.7  . Smokeless  tobacco: Never Used  Substance and Sexual Activity  . Alcohol use: Yes    Comment: social use/wine  . Drug use: No  . Sexual activity: Not on file  Lifestyle  . Physical activity    Days per week: Not on file    Minutes per session: Not on file  . Stress: Not on file  Relationships  . Social Herbalist on phone: Not on file    Gets together: Not on file    Attends religious service: Not on file    Active member of club or organization: Not on file    Attends meetings of clubs or organizations: Not on file    Relationship status: Not on file  . Intimate partner violence    Fear of current or ex partner: Not on file    Emotionally abused: Not on file    Physically abused: Not on file    Forced sexual activity: Not on file  Other Topics Concern  . Not on file  Social History Narrative   They have 2 children and at least two grandchildren. She does all her activities of daily living. She works as a Midwife for [employer].     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 She does other days of the gym and she is able to at least 60 minutes a day 5 days a week.   She is a former smoker who quit in 1990. This was after smoking a pack a day for 10 years. She drinks social alcohol on occasion.   The PMH, PSH, Social History, Family History, Medications, and allergies have been reviewed in [facility], and have been updated if relevant.   Review of Systems  Constitutional: Negative.   HENT: Negative.   Eyes: Negative.   Respiratory: Negative.   Cardiovascular: Negative.   Gastrointestinal: Negative.   Endocrine: Negative.   Genitourinary: Negative.   Musculoskeletal: Negative.   Allergic/Immunologic: Negative.    Neurological: Negative.   Hematological: Negative.   Psychiatric/Behavioral: Negative.   All other systems reviewed and are negative.      Objective:    BP 110/76 (BP Location: Left Arm, Patient Position: Sitting, Cuff Size: Normal)   Pulse 60   Temp 99.4 F (37.4 C) (Oral)   Wt 179 lb 9.6 oz (81.5 kg)   SpO2 98%   BMI 25.23 kg/m    Physical Exam Vitals signs and nursing note reviewed.  Constitutional:      Appearance: Normal appearance.  HENT:     Head: Normocephalic.     Right Ear: Tympanic membrane normal.     Left Ear: Tympanic membrane normal.     Nose: Nose normal.     Mouth/Throat:     Mouth: Mucous membranes are moist.  Eyes:     Extraocular Movements: Extraocular movements intact.  Neck:     Musculoskeletal: Normal range of motion.  Cardiovascular:     Rate and Rhythm: Normal rate and regular rhythm.     Pulses: Normal pulses.     Heart sounds: Normal heart sounds.  Pulmonary:     Effort: Pulmonary effort is normal.     Breath sounds: Normal breath sounds.  Abdominal:     General: Abdomen is flat.  Musculoskeletal: Normal range of motion.        General: No swelling.  Skin:    General:  Skin is warm and dry.  Neurological:     General: No focal deficit present.     Mental Status: She is alert and oriented to person, place, and time.  Psychiatric:        Mood and Affect: Mood normal.        Behavior: Behavior normal.        Thought Content: Thought content normal.        Judgment: Judgment normal.           Assessment & Plan:   Neutropenia, unspecified type ([place]) - Plan: CBC with Differential/Platelet, Pathologist smear review, Lactate Dehydrogenase (LDH), Hepatitis, Acute, B12 and Folate Panel, HIV antibody (with reflex), Ceruloplasmin, Alpha-1-antitrypsin No follow-ups on file.

## 2019-09-01 ENCOUNTER — Other Ambulatory Visit: Payer: Self-pay | Admitting: Family Medicine

## 2019-09-01 DIAGNOSIS — D709 Neutropenia, unspecified: Secondary | ICD-10-CM | POA: Insufficient documentation

## 2019-09-02 ENCOUNTER — Encounter: Payer: Self-pay | Admitting: Family Medicine

## 2019-09-02 ENCOUNTER — Ambulatory Visit: Payer: Managed Care, Other (non HMO) | Admitting: Family Medicine

## 2019-09-02 ENCOUNTER — Other Ambulatory Visit: Payer: Self-pay

## 2019-09-02 VITALS — BP 110/76 | HR 60 | Temp 99.4°F | Wt 179.6 lb

## 2019-09-02 DIAGNOSIS — D709 Neutropenia, unspecified: Secondary | ICD-10-CM | POA: Diagnosis not present

## 2019-09-02 LAB — CBC WITH DIFFERENTIAL/PLATELET
Basophils Absolute: 0.1 10*3/uL (ref 0.0–0.1)
Basophils Relative: 0.6 % (ref 0.0–3.0)
Eosinophils Absolute: 0.2 10*3/uL (ref 0.0–0.7)
Eosinophils Relative: 1.6 % (ref 0.0–5.0)
HCT: 41.4 % (ref 36.0–46.0)
Hemoglobin: 13.9 g/dL (ref 12.0–15.0)
Lymphocytes Relative: 59.4 % — ABNORMAL HIGH (ref 12.0–46.0)
Lymphs Abs: 5.6 10*3/uL — ABNORMAL HIGH (ref 0.7–4.0)
MCHC: 33.5 g/dL (ref 30.0–36.0)
MCV: 94 fl (ref 78.0–100.0)
Monocytes Absolute: 0.6 10*3/uL (ref 0.1–1.0)
Monocytes Relative: 6.1 % (ref 3.0–12.0)
Neutro Abs: 3.2 10*3/uL (ref 1.4–7.7)
Neutrophils Relative %: 33.7 % — ABNORMAL LOW (ref 43.0–77.0)
Platelets: 288 10*3/uL (ref 150.0–400.0)
RBC: 4.41 Mil/uL (ref 3.87–5.11)
RDW: 13.3 % (ref 11.5–15.5)
WBC: 9.6 10*3/uL (ref 4.0–10.5)

## 2019-09-02 LAB — B12 AND FOLATE PANEL
Folate: >24.7 ng/mL (ref >5.9)
Vitamin B-12: 1018 pg/mL — ABNORMAL HIGH (ref 211–911)

## 2019-09-02 MED ORDER — DEXILANT 30 MG PO CPDR: 30.0000 mg | DELAYED_RELEASE_CAPSULE | Freq: Every day | ORAL | 3 refills | Status: DC

## 2019-09-02 MED ORDER — OMEPRAZOLE 20 MG PO CPDR: 20.0000 mg | DELAYED_RELEASE_CAPSULE | Freq: Every day | ORAL | 3 refills | Status: DC

## 2019-09-02 NOTE — Assessment & Plan Note (Addendum)
>  40 minutes spent in face to face time with patient, >50% spent in counselling or coordination of care discussing and reviewing her CBC with diff- look at the treand from 03/17/17 until now- the reassuring aspects as well- along with the causes.  She will stop as alleve as she has not tried tylenol for soreness after walking.  D/c Dexilant, start omeprazole 20 mg daily.  Since she may no longer need valtrex daily for suppression, she agreed to stop it for a couple of months as well.  We talked about other causes which we will rule out with blood work today. The patient indicates understanding of these issues and agrees with the plan.  Orders Placed This Encounter  Procedures  . CBC with Differential/Platelet  . Pathologist smear review  . Lactate Dehydrogenase (LDH)  . Hepatitis, Acute  . B12 and Folate Panel  . HIV antibody (with reflex)  . Ceruloplasmin  . Alpha-1-antitrypsin

## 2019-09-02 NOTE — Patient Instructions (Addendum)
Great to see you. I will call you with your lab results from today and you can view them online.   Stop taking Alleve- start taking Tylenol ES.   Stop Delixant.  Start taking omeprazole 20 mg daily.  Hold valtrex for a month or two.

## 2019-09-03 LAB — HEPATITIS PANEL, ACUTE
Hep A IgM: NONREACTIVE
Hep B C IgM: NONREACTIVE
Hepatitis B Surface Ag: NONREACTIVE
Hepatitis C Ab: NONREACTIVE
SIGNAL TO CUT-OFF: 0.06 (ref <1.00)

## 2019-09-03 LAB — CERULOPLASMIN: Ceruloplasmin: 31 mg/dL (ref 18–53)

## 2019-09-03 LAB — LACTATE DEHYDROGENASE: LDH: 180 U/L (ref 120–250)

## 2019-09-03 LAB — HIV ANTIBODY (ROUTINE TESTING W REFLEX): HIV 1&2 Ab, 4th Generation: NONREACTIVE

## 2019-09-03 LAB — PATHOLOGIST SMEAR REVIEW

## 2019-09-03 LAB — ALPHA-1-ANTITRYPSIN: A-1 Antitrypsin, Ser: 123 mg/dL (ref 83–199)

## 2019-09-04 ENCOUNTER — Other Ambulatory Visit: Payer: Self-pay | Admitting: Family Medicine

## 2019-09-04 DIAGNOSIS — D709 Neutropenia, unspecified: Secondary | ICD-10-CM

## 2019-09-05 ENCOUNTER — Telehealth: Payer: Self-pay | Admitting: Family Medicine

## 2019-09-05 NOTE — Telephone Encounter (Signed)
Patient is calling to ask Dr. Deborra Medina if she could change her medication omeprazole (PRILOSEC) 20 MG capsule [id number]  the pharmacy wants to charge her $190 for 3 month supply. The patient has new insurance through [place]. Can an alternative medication be recommended or anything else be done? CB- [phone]

## 2019-09-06 ENCOUNTER — Telehealth: Payer: Self-pay | Admitting: Hematology

## 2019-09-06 NOTE — Telephone Encounter (Signed)
Can she call her insurance company and ask them which PPI (the class of drug omeprazole is)- they prefer to cover? Thanks!

## 2019-09-06 NOTE — Telephone Encounter (Signed)
Spoke with patient to confirm new patient appt 10/13 at 0830

## 2019-09-12 ENCOUNTER — Encounter: Payer: Self-pay | Admitting: Family Medicine

## 2019-09-12 NOTE — Telephone Encounter (Signed)
Letter done & message sent to pt that it is available through mychart.

## 2019-09-12 NOTE — Telephone Encounter (Signed)
Yes okay to write letter as requested. Thank you!

## 2019-09-20 ENCOUNTER — Other Ambulatory Visit: Payer: Self-pay | Admitting: Hematology

## 2019-09-20 DIAGNOSIS — D72820 Lymphocytosis (symptomatic): Secondary | ICD-10-CM | POA: Insufficient documentation

## 2019-09-20 NOTE — Progress Notes (Signed)
[facility] NOTE  Patient Care Team: Lucille Passy, MD as PCP - General (Family Medicine) Pyrtle, Lajuan Lines, MD as Consulting Physician (Gastroenterology) Mosetta Anis, MD as Referring Physician (Allergy)  HEME/ONC OVERVIEW: 1. Lymphocytosis  -2020: new lymphocyte predominance on diff (50-60%); CBC normal  ASSESSMENT & PLAN:   Lymphocytosis  -I reviewed patient's records in detail, including PCP clinic notes and lab studies -In summary, patient had normal CBC until 06/2019, when she presented for evaluation and treatment of recurrent genital herpes infection. Differential showed a mild lymphocyte predominance (50-60%).  She had been on suppressive Valtrex since 2019, but due to the lymphocytosis, Valtrex was discontinued.  Since stopping Valtrex, she had one more episode of recurrent genital herpes infection in early 08/2019.  Repeat CBC in 08/2019 showed mild persistent lymphocytosis, but the reminder of the CBC, including WBC, Hgb and plt count, were normal.  Patient was referred to hematology for further evaluation.  -I discussed the lab findings in detail with the patient  -WBC 10.5k w/ 54% lymphocytes today -I personally reviewed the patient's peripheral blood smear today.  The red blood cells were of normal morphology.  There was no schistocytosis.  The white blood cells were of normal morphology with some increased number of lymphocytes, but there were no atypical lymphocytes or smudge cells on the smear. There were no peripheral circulating blasts. The platelets were of normal size and I verified that there were no platelet clumping. -Given the new onset lymphocytosis, I have ordered HSV PCR, HIV, and Hep B/C serologies to rule out viral infection -In addition, I have ordered peripheral blood flow cytometry to assess for any monoclonal population -If the work-up shows monoclonal lymphocytes, then she will require CT CAP to assess for any lymphadenopathy  -If the  above work-up does not reveal any monoclonal lymphocytes, then the mild lymphocytosis is likely due to recent HSV infection, which may take several months to normalize; furthermore, as she has had at least two episodes of recurrent genital herpes infection since stopping suppressive Valtrex, she should strongly consider resuming her suppressive Valtrex.  I recommend the patient to discuss this further with her PCP.  -I also counseled the patient on the importance of keeping up-to-date with age-appropriate cancer screening, including MMG, colonoscopy and pelvic exam   Orders Placed This Encounter  Procedures  . CBC with Differential (Cancer Center Only)    Standing Status:   Future    Standing Expiration Date:   10/28/2020  . CMP ([facility] only)    Standing Status:   Future    Standing Expiration Date:   10/28/2020  . Save Smear ([facility])    Standing Status:   Future    Standing Expiration Date:   09/23/2020  . Lactate dehydrogenase    Standing Status:   Future    Standing Expiration Date:   10/28/2020   All questions were answered. The patient knows to call the clinic with any problems, questions or concerns.  Return in 1 month for labs and clinic appt.   Tish Men, MD 09/24/2019 9:49 AM   CHIEF COMPLAINTS/PURPOSE OF CONSULTATION:  "I just want to find out what's going on*"  HISTORY OF PRESENTING ILLNESS:  [name] 58 y.o. female is here because of mild lymphocyte predominance on CBC.   Patient had normal CBC until 06/2019, when she presented for evaluation and treatment of recurrent genital herpes infection. Differential showed a mild lymphocyte predominance (50-60%).  She had been on  suppressive Valtrex since 2019, but due to the lymphocytosis, Valtrex was discontinued.  Since stopping Valtrex, she had one more episode of recurrent genital herpes infection in early 08/2019.  Repeat CBC in 08/2019 showed mild persistent lymphocytosis, but the reminder of the CBC, including  WBC, Hgb and plt count, were normal.  Patient was referred to hematology for further evaluation.   Patient reports that since early 08/2019, she has not had any recurrent genital herpes infection.  She has not been back on her suppressive Valtrex.  She reports occasional fatigue periodic transient lower abdominal quadrant discomfort, which she attributes to IBS.  She denies any constitutional symptoms.  REVIEW OF SYSTEMS:   Constitutional: ( - ) fevers, ( - )  chills , ( - ) night sweats Eyes: ( - ) blurriness of vision, ( - ) double vision, ( - ) watery eyes Ears, nose, mouth, throat, and face: ( - ) mucositis, ( - ) sore throat Respiratory: ( - ) cough, ( - ) dyspnea, ( - ) wheezes Cardiovascular: ( - ) palpitation, ( - ) chest discomfort, ( - ) lower extremity swelling Gastrointestinal:  ( - ) nausea, ( - ) heartburn, ( - ) change in bowel habits Skin: ( - ) abnormal skin rashes Lymphatics: ( - ) new lymphadenopathy, ( - ) easy bruising Neurological: ( - ) numbness, ( - ) tingling, ( - ) new weaknesses Behavioral/Psych: ( - ) mood change, ( - ) new changes  All other systems were reviewed with the patient and are negative.  I have reviewed her chart and materials related to her cancer extensively and collaborated history with the patient. Summary of oncologic history is as follows: Oncology History   No history exists.    MEDICAL HISTORY:  Past Medical History:  Diagnosis Date  . Allergic rhinitis   . Anxiety   . Back pain   . Contact lens/glasses fitting   . Depression   . Endometriosis   . Fatigue   . GERD (gastroesophageal reflux disease)   . Hiatal hernia   . History of cardiac catheterization 12/1990  . History of Ostium Secundum Atrial Septal Defect 1989   Status post repair in 1989/1990  . Hx of migraines   . IBS (irritable bowel syndrome)   . Incontinence   . Intestinal volvulus [facility]) May 2007  . Loss of appetite   . Onychomycosis   . Varicose veins      SURGICAL HISTORY: Past Surgical History:  Procedure Laterality Date  . ABDOMINAL HYSTERECTOMY  2004  . ASD REPAIR, SECUNDUM  1990   Dr. Servando Snare  . BALLOON SINUPLASTY Left   . EYE SURGERY     muscle reattachment 02/2017  . hysterectomy for severe endometriosis    . NASAL SEPTUM SURGERY     Dr. Georgia Lopes  . OPEN REDUCTION INTERNAL FIXATION (ORIF) DISTAL RADIAL FRACTURE Right 06/20/2017   Procedure: OPEN REDUCTION INTERNAL FIXATION (ORIF) DISTAL RADIAL FRACTURE;  Surgeon: Leanora Cover, MD;  Location: [facility];  Service: Orthopedics;  Laterality: Right;  . right ankle surgery  2010  . right leg fracture with surgery     done with ankle surgery  . TRANSTHORACIC ECHOCARDIOGRAM   January 2012   Normal LV size and function, EF greater than 55%. Mild LA dilation. No intra-atrial shunt with bubble study. Normal pulmonary pressures. Only trace to mild mitral and tricuspid regurgitation.  . varicose laser vein surgery  2009    SOCIAL HISTORY: Social History  Socioeconomic History  . Marital status: Married    Spouse name: Richard  . Number of children: Not on file  . Years of education: Not on file  . Highest education level: Not on file  Occupational History  . Occupation: Scientist, clinical (histocompatibility and immunogenetics): [employer]  . Financial resource strain: Not on file  . Food insecurity    Worry: Not on file    Inability: Not on file  . Transportation needs    Medical: Not on file    Non-medical: Not on file  Tobacco Use  . Smoking status: Former Smoker    Packs/day: 1.00    Years: 10.00    Pack years: 10.00    Types: Cigarettes    Quit date: 12/12/1988    Years since quitting: 30.8  . Smokeless tobacco: Never Used  Substance and Sexual Activity  . Alcohol use: Yes    Comment: social use/wine  . Drug use: No  . Sexual activity: Not on file  Lifestyle  . Physical activity    Days per week: Not on file    Minutes per session: Not on file  . Stress:  Not on file  Relationships  . Social Herbalist on phone: Not on file    Gets together: Not on file    Attends religious service: Not on file    Active member of club or organization: Not on file    Attends meetings of clubs or organizations: Not on file    Relationship status: Not on file  . Intimate partner violence    Fear of current or ex partner: Not on file    Emotionally abused: Not on file    Physically abused: Not on file    Forced sexual activity: Not on file  Other Topics Concern  . Not on file  Social History Narrative   They have 2 children and at least two grandchildren. She does all her activities of daily living. She works as a Midwife for [employer].     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 She does other days of the gym and she is able to at least 60 minutes a day 5 days a week.   She is a former smoker who quit in 1990. This was after smoking a pack a day for 10 years. She drinks social alcohol on occasion.    FAMILY HISTORY: Family History  Problem Relation Age of Onset  . Diabetes Mother   . Colon cancer Paternal Grandfather   . Heart disease Maternal Grandfather   . Prostate cancer Maternal Grandfather   . Lung cancer Maternal Grandmother        with mets to the brain  . Clotting disorder Maternal Grandmother   . Clotting disorder Sister   . Heart disease Sister     ALLERGIES:  is allergic to chlordiazepoxide-clidinium; codeine; and penicillins.  MEDICATIONS:  Current Outpatient Medications  Medication Sig Dispense Refill  . Ascorbic Acid (VITA-C PO) Take by mouth.    . Azelastine HCl 137 MCG/SPRAY SOLN INSTILL 1 PUFF IN EACH NOSTRIL ONCE A DAY NASALLY 30 DAYS    . b complex vitamins capsule Take 1 capsule by mouth daily.    . Calcium Carb-Cholecalciferol (CALTRATE 600+D) 600-800 MG-UNIT TABS Take 1 tablet by mouth 2 (two) times  daily.    [name]  cycloSPORINE (RESTASIS) 0.05 % ophthalmic emulsion 1 drop 2 (two) times daily.    [name] EPIPEN 2-PAK 0.3 MG/0.3ML SOAJ injection     . FLUoxetine (PROZAC) 10 MG capsule Take 1 capsule (10 mg total) by mouth daily. 30 capsule 3  . fluticasone (FLONASE) 50 MCG/ACT nasal spray instill 2 sprays into each nostril once daily 16 g 1  . levocetirizine (XYZAL) 5 MG tablet     . LINZESS 145 MCG CAPS capsule TAKE 1 CAPSULE (145 MCG TOTAL) BY MOUTH DAILY BEFORE BREAKFAST. 90 capsule 1  . magnesium oxide (MAG-OX) 400 MG tablet Take 400 mg by mouth daily.    . montelukast (SINGULAIR) 10 MG tablet     . Multiple Vitamin (MULTIVITAMINS PO) Take 1 tablet by mouth daily.      [name] omeprazole (PRILOSEC) 20 MG capsule Take 1 capsule (20 mg total) by mouth daily. 30 capsule 3  . polyethylene glycol (MIRALAX / GLYCOLAX) packet Take 17 g by mouth daily.    . valACYclovir (VALTREX) 500 MG tablet Take 1 tablet (500 mg total) by mouth daily. 90 tablet 3   No current facility-administered medications for this visit.     PHYSICAL EXAMINATION: ECOG PERFORMANCE STATUS: 1 - Symptomatic but completely ambulatory  Vitals:   09/24/19 0902  BP: (!) 124/56  Pulse: (!) 58  Resp: 16  Temp: (!) 97.3 F (36.3 C)  SpO2: 100%   Filed Weights   09/24/19 0902  Weight: 178 lb 8 oz (81 kg)    GENERAL: alert, no distress and comfortable SKIN: skin color, texture, turgor are normal, no rashes or significant lesions EYES: conjunctiva are pink and non-injected, sclera clear OROPHARYNX: no exudate, no erythema; lips, buccal mucosa, and tongue normal  NECK: supple, non-tender LYMPH:  no palpable lymphadenopathy in the cervical LUNGS: clear to auscultation with normal breathing effort HEART: regular rate & rhythm, no murmurs, no lower extremity edema ABDOMEN: soft, non-tender, non-distended, normal bowel sounds Musculoskeletal: no cyanosis of digits and no clubbing  PSYCH: alert & oriented x 3, fluent speech NEURO: no  focal motor/sensory deficits  LABORATORY DATA:  I have reviewed the data as listed Lab Results  Component Value Date   WBC 10.5 09/24/2019   HGB 14.1 09/24/2019   HCT 43.9 09/24/2019   MCV 95.9 09/24/2019   PLT 286 09/24/2019   Lab Results  Component Value Date   NA 143 09/24/2019   K 4.3 09/24/2019   CL 105 09/24/2019   CO2 31 09/24/2019    RADIOGRAPHIC STUDIES: I have personally reviewed the radiological images as listed and agreed with the findings in the report. No results found.  PATHOLOGY: I have reviewed the pathology reports as documented in the oncologist history.

## 2019-09-24 ENCOUNTER — Telehealth: Payer: Self-pay | Admitting: Hematology

## 2019-09-24 ENCOUNTER — Encounter: Payer: Self-pay | Admitting: Hematology

## 2019-09-24 ENCOUNTER — Inpatient Hospital Stay: Payer: Managed Care, Other (non HMO) | Attending: Hematology | Admitting: Hematology

## 2019-09-24 ENCOUNTER — Other Ambulatory Visit: Payer: Self-pay

## 2019-09-24 ENCOUNTER — Inpatient Hospital Stay: Payer: Managed Care, Other (non HMO)

## 2019-09-24 VITALS — BP 124/56 | HR 58 | Temp 97.3°F | Resp 16 | Ht 71.0 in | Wt 178.5 lb

## 2019-09-24 DIAGNOSIS — F329 Major depressive disorder, single episode, unspecified: Secondary | ICD-10-CM | POA: Diagnosis not present

## 2019-09-24 DIAGNOSIS — D72820 Lymphocytosis (symptomatic): Secondary | ICD-10-CM

## 2019-09-24 DIAGNOSIS — K589 Irritable bowel syndrome without diarrhea: Secondary | ICD-10-CM | POA: Insufficient documentation

## 2019-09-24 DIAGNOSIS — R5383 Other fatigue: Secondary | ICD-10-CM | POA: Insufficient documentation

## 2019-09-24 DIAGNOSIS — Z87891 Personal history of nicotine dependence: Secondary | ICD-10-CM | POA: Insufficient documentation

## 2019-09-24 DIAGNOSIS — Z79899 Other long term (current) drug therapy: Secondary | ICD-10-CM | POA: Insufficient documentation

## 2019-09-24 DIAGNOSIS — K219 Gastro-esophageal reflux disease without esophagitis: Secondary | ICD-10-CM | POA: Insufficient documentation

## 2019-09-24 DIAGNOSIS — Z8249 Family history of ischemic heart disease and other diseases of the circulatory system: Secondary | ICD-10-CM | POA: Insufficient documentation

## 2019-09-24 LAB — CBC WITH DIFFERENTIAL (CANCER CENTER ONLY)
Abs Immature Granulocytes: 0.01 10*3/uL (ref 0.00–0.07)
Basophils Absolute: 0.1 10*3/uL (ref 0.0–0.1)
Basophils Relative: 1 %
Eosinophils Absolute: 0.2 10*3/uL (ref 0.0–0.5)
Eosinophils Relative: 2 %
HCT: 43.9 % (ref 36.0–46.0)
Hemoglobin: 14.1 g/dL (ref 12.0–15.0)
Immature Granulocytes: 0 %
Lymphocytes Relative: 54 %
Lymphs Abs: 5.7 10*3/uL — ABNORMAL HIGH (ref 0.7–4.0)
MCH: 30.8 pg (ref 26.0–34.0)
MCHC: 32.1 g/dL (ref 30.0–36.0)
MCV: 95.9 fL (ref 80.0–100.0)
Monocytes Absolute: 0.7 10*3/uL (ref 0.1–1.0)
Monocytes Relative: 6 %
Neutro Abs: 3.9 10*3/uL (ref 1.7–7.7)
Neutrophils Relative %: 37 %
Platelet Count: 286 10*3/uL (ref 150–400)
RBC: 4.58 MIL/uL (ref 3.87–5.11)
RDW: 12.9 % (ref 11.5–15.5)
WBC Count: 10.5 10*3/uL (ref 4.0–10.5)
nRBC: 0 % (ref 0.0–0.2)

## 2019-09-24 LAB — CMP (CANCER CENTER ONLY)
ALT: 19 U/L (ref 0–44)
AST: 22 U/L (ref 15–41)
Albumin: 4.4 g/dL (ref 3.5–5.0)
Alkaline Phosphatase: 53 U/L (ref 38–126)
Anion gap: 7 (ref 5–15)
BUN: 17 mg/dL (ref 6–20)
CO2: 31 mmol/L (ref 22–32)
Calcium: 9.6 mg/dL (ref 8.9–10.3)
Chloride: 105 mmol/L (ref 98–111)
Creatinine: 0.88 mg/dL (ref 0.44–1.00)
GFR, Est AFR Am: >60 mL/min (ref >60)
GFR, Estimated: >60 mL/min (ref >60)
Glucose, Bld: 100 mg/dL — ABNORMAL HIGH (ref 70–99)
Potassium: 4.3 mmol/L (ref 3.5–5.1)
Sodium: 143 mmol/L (ref 135–145)
Total Bilirubin: 0.8 mg/dL (ref 0.3–1.2)
Total Protein: 6.8 g/dL (ref 6.5–8.1)

## 2019-09-24 LAB — HEPATITIS B CORE ANTIBODY, TOTAL: Hep B Core Total Ab: NONREACTIVE

## 2019-09-24 LAB — SAVE SMEAR(SSMR), FOR PROVIDER SLIDE REVIEW

## 2019-09-24 LAB — HIV ANTIBODY (ROUTINE TESTING W REFLEX): HIV Screen 4th Generation wRfx: NONREACTIVE

## 2019-09-24 LAB — HEPATITIS B SURFACE ANTIGEN: Hepatitis B Surface Ag: NONREACTIVE

## 2019-09-24 LAB — LACTATE DEHYDROGENASE: LDH: 216 U/L — ABNORMAL HIGH (ref 98–192)

## 2019-09-24 LAB — HEPATITIS B SURFACE ANTIBODY,QUALITATIVE: Hep B S Ab: NONREACTIVE

## 2019-09-24 NOTE — Telephone Encounter (Signed)
Appointments scheduled as requested calendar printed per 10/13 los

## 2019-09-25 LAB — HSV AND VZV PCR PANEL

## 2019-09-25 LAB — SURGICAL PATHOLOGY

## 2019-09-25 LAB — HCV COMMENT:

## 2019-09-25 LAB — HEPATITIS C ANTIBODY (REFLEX): HCV Ab: 0.1 s/co ratio (ref 0.0–0.9)

## 2019-10-02 ENCOUNTER — Other Ambulatory Visit: Payer: Self-pay | Admitting: Hematology

## 2019-10-02 ENCOUNTER — Other Ambulatory Visit: Payer: Self-pay

## 2019-10-02 DIAGNOSIS — Z20822 Contact with and (suspected) exposure to covid-19: Secondary | ICD-10-CM

## 2019-10-02 DIAGNOSIS — C9110 Chronic lymphocytic leukemia of B-cell type not having achieved remission: Secondary | ICD-10-CM

## 2019-10-03 ENCOUNTER — Other Ambulatory Visit: Payer: Self-pay

## 2019-10-03 ENCOUNTER — Encounter: Payer: Self-pay | Admitting: Hematology

## 2019-10-03 ENCOUNTER — Telehealth (INDEPENDENT_AMBULATORY_CARE_PROVIDER_SITE_OTHER): Payer: Managed Care, Other (non HMO) | Admitting: Family Medicine

## 2019-10-03 ENCOUNTER — Encounter: Payer: Self-pay | Admitting: Family Medicine

## 2019-10-03 DIAGNOSIS — J069 Acute upper respiratory infection, unspecified: Secondary | ICD-10-CM | POA: Insufficient documentation

## 2019-10-03 LAB — FLOW CYTOMETRY

## 2019-10-03 MED ORDER — AZITHROMYCIN 500 MG PO TABS: 500.0000 mg | ORAL_TABLET | Freq: Every day | ORAL | 0 refills | Status: AC

## 2019-10-03 NOTE — Progress Notes (Signed)
Virtual Visit via Video   Due to the COVID-19 pandemic, this visit was completed with telemedicine (audio/video) technology to reduce patient and provider exposure as well as to preserve personal protective equipment.   I connected with [name] by a video enabled telemedicine application and verified that I am speaking with the correct person using two identifiers. Location patient: Home Location provider: [facility], Office Persons participating in the virtual visit: Marlinda Mike, MD   I discussed the limitations of evaluation and management by telemedicine and the availability of in person appointments. The patient expressed understanding and agreed to proceed.  Care Team   Patient Care Team: Lucille Passy, MD as PCP - General (Family Medicine) Pyrtle, Lajuan Lines, MD as Consulting Physician (Gastroenterology) Mosetta Anis, MD as Referring Physician (Allergy)  Subjective:   HPI:   ? Strep throat? She is C/O throat pain with white spots on her tonsils. Sx started Sunday and has been progressing. She also has bilateral ear pain and pain when swallowing and now has diarrhea. She also has headache and a bit of fatigue. She feels as if she has Strep. She is afebrile but has chills and sweats.  No loss of taste or sense of smell.  She went to covid testing site yesterday- results are pending.  Review of Systems  Constitutional: Positive for chills and malaise/fatigue. Negative for fever and weight loss.  HENT: Positive for ear pain and sore throat. Negative for congestion, ear discharge, hearing loss, nosebleeds, sinus pain and tinnitus.   Respiratory: Negative.  Negative for stridor.   Cardiovascular: Negative.   Gastrointestinal: Positive for diarrhea.  Genitourinary: Negative.   Musculoskeletal: Negative.   Skin: Negative.   Neurological: Negative.   Endo/Heme/Allergies: Negative.   Psychiatric/Behavioral: Negative.   All other systems reviewed and  are negative.    Patient Active Problem List   Diagnosis Date Noted  . Upper respiratory infection 10/03/2019  . Monoclonal B-cell lymphocytosis 09/20/2019  . Neutropenia ([place]) 09/01/2019  . Allergic rhinitis   . OSA (obstructive sleep apnea) 02/20/2019  . HSV (herpes simplex virus) anogenital infection 08/31/2018  . Chronic constipation 05/31/2018  . Eczema 01/09/2016  . GERD (gastroesophageal reflux disease)-probable paroxsysmal relaxation LES 03/14/2012  . Hair loss 06/29/2011  . GERD 02/28/2011  . VARICOSE VEINS, LOWER EXTREMITIES 01/08/2008  . INTESTINAL VOLVULUS, LARGE BOWEL 01/08/2008  . Anxiety 01/07/2008  . IBS (irritable bowel syndrome) 01/07/2008  . MIGRAINES, HX OF 01/07/2008  . ATRIAL SEPTAL DEFECT - Status Post Repair 01/08/1988    Social History   Tobacco Use  . Smoking status: Former Smoker    Packs/day: 1.00    Years: 10.00    Pack years: 10.00    Types: Cigarettes    Quit date: 12/12/1988    Years since quitting: 30.8  . Smokeless tobacco: Never Used  Substance Use Topics  . Alcohol use: Yes    Comment: social use/wine    Current Outpatient Medications:  .  Ascorbic Acid (VITA-C PO), Take by mouth., Disp: , Rfl:  .  Azelastine HCl 137 MCG/SPRAY SOLN, INSTILL 1 PUFF IN EACH NOSTRIL ONCE A DAY NASALLY 30 DAYS, Disp: , Rfl:  .  b complex vitamins capsule, Take 1 capsule by mouth daily., Disp: , Rfl:  .  Calcium Carb-Cholecalciferol (CALTRATE 600+D) 600-800 MG-UNIT TABS, Take 1 tablet by mouth 2 (two) times daily., Disp: , Rfl:  .  cycloSPORINE (RESTASIS) 0.05 % ophthalmic emulsion, 1 drop 2 (two) times daily.,  Disp: , Rfl:  .  EPIPEN 2-PAK 0.3 MG/0.3ML SOAJ injection, , Disp: , Rfl:  .  FLUoxetine (PROZAC) 10 MG capsule, Take 1 capsule (10 mg total) by mouth daily., Disp: 30 capsule, Rfl: 3 .  fluticasone (FLONASE) 50 MCG/ACT nasal spray, instill 2 sprays into each nostril once daily, Disp: 16 g, Rfl: 1 .  levocetirizine (XYZAL) 5 MG tablet, , Disp: , Rfl:   .  LINZESS 145 MCG CAPS capsule, TAKE 1 CAPSULE (145 MCG TOTAL) BY MOUTH DAILY BEFORE BREAKFAST., Disp: 90 capsule, Rfl: 1 .  magnesium oxide (MAG-OX) 400 MG tablet, Take 400 mg by mouth daily., Disp: , Rfl:  .  montelukast (SINGULAIR) 10 MG tablet, , Disp: , Rfl:  .  Multiple Vitamin (MULTIVITAMINS PO), Take 1 tablet by mouth daily.  , Disp: , Rfl:  .  omeprazole (PRILOSEC) 20 MG capsule, Take 1 capsule (20 mg total) by mouth daily., Disp: 30 capsule, Rfl: 3 .  polyethylene glycol (MIRALAX / GLYCOLAX) packet, Take 17 g by mouth daily., Disp: , Rfl:  .  valACYclovir (VALTREX) 500 MG tablet, Take 1 tablet (500 mg total) by mouth daily., Disp: 90 tablet, Rfl: 3 .  azithromycin (ZITHROMAX) 500 MG tablet, Take 1 tablet (500 mg total) by mouth daily for 3 doses., Disp: 3 tablet, Rfl: 0  Allergies  Allergen Reactions  . Chlordiazepoxide-Clidinium     "loopy"  . Codeine     REACTION: nausea,vomitting and dizziness  . Penicillins     REACTION: hives ---taken as a child    Objective:  Temp (!) 97.5 F (36.4 C) (Axillary)   Wt 177 lb 8 oz (80.5 kg)   BMI 24.76 kg/m   VITALS: Per patient if applicable, see vitals. GENERAL: Alert, appears well and in no acute distress. HEENT: Atraumatic, conjunctiva clear, no obvious abnormalities on inspection of external nose and ears.  White patches visible on adenoids. NECK: Normal movements of the head and neck. CARDIOPULMONARY: No increased WOB. Speaking in clear sentences. I:E ratio WNL.  MS: Moves all visible extremities without noticeable abnormality. PSYCH: Pleasant and cooperative, well-groomed. Speech normal rate and rhythm. Affect is appropriate. Insight and judgement are appropriate. Attention is focused, linear, and appropriate.  NEURO: CN grossly intact. Oriented as arrived to appointment on time with no prompting. Moves both UE equally.  SKIN: No obvious lesions, wounds, erythema, or cyanosis noted on face or hands.  Depression screen PHQ  2/9 02/20/2019  Decreased Interest 0  Down, Depressed, Hopeless 0  PHQ - 2 Score 0    Assessment and Plan:   Edwina was seen today for sore throat.  Diagnoses and all orders for this visit:  Upper respiratory tract infection, unspecified type  Other orders -     azithromycin (ZITHROMAX) 500 MG tablet; Take 1 tablet (500 mg total) by mouth daily for 3 doses.    [name] COVID-19 Education: The signs and symptoms of COVID-19 were discussed with the patient and how to seek care for testing if needed. The importance of social distancing was discussed today. . Reviewed expectations re: course of current medical issues. . Discussed self-management of symptoms. . Outlined signs and symptoms indicating need for more acute intervention. . Patient verbalized understanding and all questions were answered. [name] Health Maintenance issues including appropriate healthy diet, exercise, and smoking avoidance were discussed with patient. . See orders for this visit as documented in the electronic medical record.  Arnette Norris, MD  Records requested if needed. Time spent: 25 minutes,  of which >50% was spent in obtaining information about her symptoms, reviewing her previous labs, evaluations, and treatments, counseling her about her condition (please see the discussed topics above), and developing a plan to further investigate it; she had a number of questions which I addressed.

## 2019-10-03 NOTE — Assessment & Plan Note (Signed)
>  25 minutes spent in face to face time with patient, >50% spent in counselling or coordination of care discussing her symptoms. Thankfullly, she went to testing site and had a covid swab done yesterday.  Her symptoms to seem consistent with strep/bacterial infection but could also be viral, including COVID. PCN allergic- treat with zmax 500 mg daily x 3 days. Call or send my chart message prn if these symptoms worsen or fail to improve as anticipated. The patient indicates understanding of these issues and agrees with the plan.

## 2019-10-04 ENCOUNTER — Encounter: Payer: Self-pay | Admitting: Family Medicine

## 2019-10-04 LAB — NOVEL CORONAVIRUS, NAA: SARS-CoV-2, NAA: NOT DETECTED

## 2019-10-06 ENCOUNTER — Encounter: Payer: Self-pay | Admitting: Hematology

## 2019-10-08 ENCOUNTER — Encounter: Payer: Self-pay | Admitting: Hematology

## 2019-10-08 ENCOUNTER — Encounter: Payer: Self-pay | Admitting: Family Medicine

## 2019-10-10 ENCOUNTER — Other Ambulatory Visit: Payer: Self-pay | Admitting: Family Medicine

## 2019-10-10 MED ORDER — FLUOXETINE HCL 20 MG PO CAPS: 20.0000 mg | ORAL_CAPSULE | Freq: Every day | ORAL | 3 refills | Status: DC

## 2019-10-15 ENCOUNTER — Encounter: Payer: Self-pay | Admitting: Hematology

## 2019-10-16 ENCOUNTER — Telehealth: Payer: Self-pay | Admitting: Hematology

## 2019-10-16 NOTE — Telephone Encounter (Signed)
I have called [facility] in [place] to schedule patient for CT scans per Insurance.  Her appointments are scheduled for 11/6 @ 9:15.  She was very appreciative of my help with getting her appts taken care of before her next visit w/ dr Maylon Peppers.

## 2019-10-23 ENCOUNTER — Encounter: Payer: Self-pay | Admitting: Hematology

## 2019-10-25 ENCOUNTER — Inpatient Hospital Stay (HOSPITAL_BASED_OUTPATIENT_CLINIC_OR_DEPARTMENT_OTHER): Payer: Managed Care, Other (non HMO) | Admitting: Hematology

## 2019-10-25 ENCOUNTER — Other Ambulatory Visit: Payer: Self-pay

## 2019-10-25 ENCOUNTER — Inpatient Hospital Stay: Payer: Managed Care, Other (non HMO) | Attending: Hematology

## 2019-10-25 ENCOUNTER — Encounter: Payer: Self-pay | Admitting: Hematology

## 2019-10-25 ENCOUNTER — Telehealth: Payer: Self-pay | Admitting: Internal Medicine

## 2019-10-25 VITALS — BP 103/63 | HR 50 | Temp 97.1°F | Resp 17 | Ht 71.0 in | Wt 178.0 lb

## 2019-10-25 DIAGNOSIS — Z79899 Other long term (current) drug therapy: Secondary | ICD-10-CM | POA: Diagnosis not present

## 2019-10-25 DIAGNOSIS — D72820 Lymphocytosis (symptomatic): Secondary | ICD-10-CM | POA: Diagnosis present

## 2019-10-25 DIAGNOSIS — R1013 Epigastric pain: Secondary | ICD-10-CM | POA: Insufficient documentation

## 2019-10-25 DIAGNOSIS — C9110 Chronic lymphocytic leukemia of B-cell type not having achieved remission: Secondary | ICD-10-CM

## 2019-10-25 DIAGNOSIS — K219 Gastro-esophageal reflux disease without esophagitis: Secondary | ICD-10-CM | POA: Diagnosis not present

## 2019-10-25 LAB — CBC WITH DIFFERENTIAL (CANCER CENTER ONLY)
Abs Immature Granulocytes: 0.01 10*3/uL (ref 0.00–0.07)
Basophils Absolute: 0.1 10*3/uL (ref 0.0–0.1)
Basophils Relative: 1 %
Eosinophils Absolute: 0.2 10*3/uL (ref 0.0–0.5)
Eosinophils Relative: 2 %
HCT: 43.3 % (ref 36.0–46.0)
Hemoglobin: 14.2 g/dL (ref 12.0–15.0)
Immature Granulocytes: 0 %
Lymphocytes Relative: 51 %
Lymphs Abs: 4.9 10*3/uL — ABNORMAL HIGH (ref 0.7–4.0)
MCH: 31.2 pg (ref 26.0–34.0)
MCHC: 32.8 g/dL (ref 30.0–36.0)
MCV: 95.2 fL (ref 80.0–100.0)
Monocytes Absolute: 0.8 10*3/uL (ref 0.1–1.0)
Monocytes Relative: 9 %
Neutro Abs: 3.5 10*3/uL (ref 1.7–7.7)
Neutrophils Relative %: 37 %
Platelet Count: 305 10*3/uL (ref 150–400)
RBC: 4.55 MIL/uL (ref 3.87–5.11)
RDW: 12.9 % (ref 11.5–15.5)
WBC Count: 9.5 10*3/uL (ref 4.0–10.5)
nRBC: 0 % (ref 0.0–0.2)

## 2019-10-25 LAB — SAVE SMEAR(SSMR), FOR PROVIDER SLIDE REVIEW

## 2019-10-25 LAB — CMP (CANCER CENTER ONLY)
ALT: 19 U/L (ref 0–44)
AST: 20 U/L (ref 15–41)
Albumin: 4.5 g/dL (ref 3.5–5.0)
Alkaline Phosphatase: 63 U/L (ref 38–126)
Anion gap: 6 (ref 5–15)
BUN: 12 mg/dL (ref 6–20)
CO2: 30 mmol/L (ref 22–32)
Calcium: 9 mg/dL (ref 8.9–10.3)
Chloride: 106 mmol/L (ref 98–111)
Creatinine: 0.78 mg/dL (ref 0.44–1.00)
GFR, Est AFR Am: >60 mL/min (ref >60)
GFR, Estimated: >60 mL/min (ref >60)
Glucose, Bld: 103 mg/dL — ABNORMAL HIGH (ref 70–99)
Potassium: 4.2 mmol/L (ref 3.5–5.1)
Sodium: 142 mmol/L (ref 135–145)
Total Bilirubin: 0.8 mg/dL (ref 0.3–1.2)
Total Protein: 6.3 g/dL — ABNORMAL LOW (ref 6.5–8.1)

## 2019-10-25 NOTE — Progress Notes (Signed)
[place] OFFICE PROGRESS NOTE  Patient Care Team: Lucille Passy, MD as PCP - General (Family Medicine) Pyrtle, Lajuan Lines, MD as Consulting Physician (Gastroenterology) Mosetta Anis, MD as Referring Physician (Allergy)  HEME/ONC OVERVIEW: 1. Monoclonal B-cell lymphocytosis  -2020: new lymphocyte predominance on diff (50-60%); CBC normal  CBC normal; absolute lymphocyte count ~5000  PB flow cytometry showed 56% kappa-restricted B-cell population, CD5+, CD20+; c/w CLL   No lymphadenopathy on CT CAP  -On observation   ASSESSMENT & PLAN:   Monoclonal B-cell lymphocytosis -I reviewed the CT chest, abdomen, and pelvis results on [place].  In summary, CT did not show any evidence of lymphadenopathy or hepatosplenomegaly.  It did demonstrate incidental gastric wall thickening, etiology unclear. -I discussed the imaging results in detail with the patient -Given the presence of monoclonal B-cells on peripheral blood flow cytometry with absolute lymphocyte count < 5000, this is consistent with monoclonal B-cell lymphocytosis  -I discussed with the patient the pathophysiology, diagnosis, and treatment options of monoclonal B-cell lymphocytosis vs CLL -I have ordered CLL FISH panel and molecular studies  -There is no treatment recommended for monoclonal B-cell lymphocytosis, and we will plan to monitor her blood counts q6-96months  Questionable gastric wall thickening -Noted incidentally on recent CT 10/2019 -Possibly due to gastritis -I instructed the patient to follow up with her gastroenterologist for further evaluation  Orders Placed This Encounter  Procedures  . CBC w/ diff    Standing Status:   Standing    Number of Occurrences:   20    Standing Expiration Date:   10/24/2020  . CMP    Standing Status:   Standing    Number of Occurrences:   20    Standing Expiration Date:   10/24/2020  . Save Smear (SSMR)    Standing Status:   Standing    Number of  Occurrences:   20    Standing Expiration Date:   10/24/2020  . LDH    Standing Status:   Standing    Number of Occurrences:   20    Standing Expiration Date:   10/24/2020   All questions were answered. The patient knows to call the clinic with any problems, questions or concerns. No barriers to learning was detected.  A total of more than 25 minutes were spent face-to-face with the patient during this encounter and over half of that time was spent on counseling and coordination of care as outlined above.   Return in 6 months for labs and clinic follow-up.  Tish Men, MD 10/25/2019 12:22 PM  CHIEF COMPLAINT: "My stomach bothers me a little"  INTERVAL HISTORY: Ms. Holderbaum returns to clinic for follow-up of monoclonal B-cell lymphocytosis.  Patient complains of starting about 2 weeks ago, she began to develop some intermittent epigastric pain, associated with burning sensation, nonradiating, but she denies any nausea, vomiting, hematemesis, hematochezia, or melena.  She has a history of acid reflux, for which her PCP has been adjusting her anti-acid medication.  She is currently on omeprazole 20 mg daily.  She has history of taking ibuprofen, albeit only sparingly, but she has not taking any NSAIDs for the past month.  She denies any other complaint today.  REVIEW OF SYSTEMS:   Constitutional: ( - ) fevers, ( - )  chills , ( - ) night sweats Eyes: ( - ) blurriness of vision, ( - ) double vision, ( - ) watery eyes Ears, nose, mouth, throat, and face: ( - )  mucositis, ( - ) sore throat Respiratory: ( - ) cough, ( - ) dyspnea, ( - ) wheezes Cardiovascular: ( - ) palpitation, ( - ) chest discomfort, ( - ) lower extremity swelling Gastrointestinal:  ( - ) nausea, ( + ) heartburn, ( - ) change in bowel habits Skin: ( - ) abnormal skin rashes Lymphatics: ( - ) new lymphadenopathy, ( - ) easy bruising Neurological: ( - ) numbness, ( - ) tingling, ( - ) new weaknesses Behavioral/Psych: ( - ) mood  change, ( - ) new changes  All other systems were reviewed with the patient and are negative.  SUMMARY OF ONCOLOGIC HISTORY: Oncology History   No history exists.    I have reviewed the past medical history, past surgical history, social history and family history with the patient and they are unchanged from previous note.  ALLERGIES:  is allergic to chlordiazepoxide-clidinium; codeine; and penicillins.  MEDICATIONS:  Current Outpatient Medications  Medication Sig Dispense Refill  . Ascorbic Acid (VITA-C PO) Take by mouth.    . Azelastine HCl 137 MCG/SPRAY SOLN INSTILL 1 PUFF IN EACH NOSTRIL ONCE A DAY NASALLY 30 DAYS    . b complex vitamins capsule Take 1 capsule by mouth daily.    . Calcium Carb-Cholecalciferol (CALTRATE 600+D) 600-800 MG-UNIT TABS Take 1 tablet by mouth 2 (two) times daily.    . cycloSPORINE (RESTASIS) 0.05 % ophthalmic emulsion 1 drop 2 (two) times daily.    [name] EPIPEN 2-PAK 0.3 MG/0.3ML SOAJ injection     . FLUoxetine (PROZAC) 20 MG capsule Take 1 capsule (20 mg total) by mouth daily. 30 capsule 3  . fluticasone (FLONASE) 50 MCG/ACT nasal spray instill 2 sprays into each nostril once daily 16 g 1  . levocetirizine (XYZAL) 5 MG tablet     . LINZESS 145 MCG CAPS capsule TAKE 1 CAPSULE (145 MCG TOTAL) BY MOUTH DAILY BEFORE BREAKFAST. 90 capsule 1  . magnesium oxide (MAG-OX) 400 MG tablet Take 400 mg by mouth daily.    . montelukast (SINGULAIR) 10 MG tablet     . Multiple Vitamin (MULTIVITAMINS PO) Take 1 tablet by mouth daily.      [name] omeprazole (PRILOSEC) 20 MG capsule Take 1 capsule (20 mg total) by mouth daily. 30 capsule 3  . polyethylene glycol (MIRALAX / GLYCOLAX) packet Take 17 g by mouth daily.    . valACYclovir (VALTREX) 500 MG tablet Take 1 tablet (500 mg total) by mouth daily. 90 tablet 3   No current facility-administered medications for this visit.     PHYSICAL EXAMINATION: ECOG PERFORMANCE STATUS: 0 - Asymptomatic  Today's Vitals   10/25/19 1137  10/25/19 1138  BP: 103/63   Pulse: (!) 50   Resp: 17   Temp: (!) 97.1 F (36.2 C)   TempSrc: Temporal   SpO2: 99%   Weight: 178 lb (80.7 kg)   Height: 5\' 11"  (1.803 m)   PainSc:  0-No pain   Body mass index is 24.83 kg/m.  Filed Weights   10/25/19 1137  Weight: 178 lb (80.7 kg)    GENERAL: alert, no distress and comfortable SKIN: skin color, texture, turgor are normal, no rashes or significant lesions EYES: conjunctiva are pink and non-injected, sclera clear OROPHARYNX: no exudate, no erythema; lips, buccal mucosa, and tongue normal  NECK: supple, non-tender LYMPH:  no palpable lymphadenopathy in the cervical LUNGS: clear to auscultation with normal breathing effort HEART: regular rate & rhythm and no murmurs and no lower extremity edema  ABDOMEN: soft, non-tender, non-distended, normal bowel sounds Musculoskeletal: no cyanosis of digits and no clubbing  PSYCH: alert & oriented x 3, fluent speech  LABORATORY DATA:  I have reviewed the data as listed    Component Value Date/Time   NA 142 10/25/2019 1116   K 4.2 10/25/2019 1116   CL 106 10/25/2019 1116   CO2 30 10/25/2019 1116   GLUCOSE 103 (H) 10/25/2019 1116   BUN 12 10/25/2019 1116   CREATININE 0.78 10/25/2019 1116   CALCIUM 9.0 10/25/2019 1116   PROT 6.3 (L) 10/25/2019 1116   ALBUMIN 4.5 10/25/2019 1116   AST 20 10/25/2019 1116   ALT 19 10/25/2019 1116   ALKPHOS 63 10/25/2019 1116   BILITOT 0.8 10/25/2019 1116   GFRNONAA >60 10/25/2019 1116   GFRAA >60 10/25/2019 1116    No results found for: SPEP, UPEP  Lab Results  Component Value Date   WBC 9.5 10/25/2019   NEUTROABS 3.5 10/25/2019   HGB 14.2 10/25/2019   HCT 43.3 10/25/2019   MCV 95.2 10/25/2019   PLT 305 10/25/2019      Chemistry      Component Value Date/Time   NA 142 10/25/2019 1116   K 4.2 10/25/2019 1116   CL 106 10/25/2019 1116   CO2 30 10/25/2019 1116   BUN 12 10/25/2019 1116   CREATININE 0.78 10/25/2019 1116      Component Value  Date/Time   CALCIUM 9.0 10/25/2019 1116   ALKPHOS 63 10/25/2019 1116   AST 20 10/25/2019 1116   ALT 19 10/25/2019 1116   BILITOT 0.8 10/25/2019 1116       RADIOGRAPHIC STUDIES: I have personally reviewed the radiological images as listed below and agreed with the findings in the report. No results found.

## 2019-10-25 NOTE — Telephone Encounter (Signed)
Pt called to inform that her oncologist Dr. Maylon Peppers has recommended to have an endo colon because of the findings on a recent ct scan. She is requesting if Dr. Hilarie Fredrickson could review Dr. Lorette Ang notes in [place].

## 2019-10-25 NOTE — Telephone Encounter (Signed)
See comments below from Pt.

## 2019-10-27 ENCOUNTER — Encounter: Payer: Self-pay | Admitting: Hematology

## 2019-10-28 ENCOUNTER — Encounter: Payer: Self-pay | Admitting: Internal Medicine

## 2019-10-28 LAB — LACTATE DEHYDROGENASE: LDH: 172 U/L (ref 98–192)

## 2019-10-28 NOTE — Telephone Encounter (Signed)
Procedure scheduled on 12/21 at 1:30pm at [facility].

## 2019-10-28 NOTE — Telephone Encounter (Signed)
Please schedule pt for Endo and colon, see note below from Dr. Hilarie Fredrickson.

## 2019-10-28 NOTE — Telephone Encounter (Signed)
CT abd/pelvis reviewed from [facility] Gastric wall thickening. Colon with high stool vol and diverticulosis, but no thickening.  Last colon March 2012  Ok to proceed with EGD to eval abnl CT abd/pelvis with gastric wall thickening Ok to repeat screening colonoscopy at the same time

## 2019-10-28 NOTE — Addendum Note (Signed)
Addended by: Tish Men on: 10/28/2019 12:51 PM   Modules accepted: Orders

## 2019-11-02 ENCOUNTER — Other Ambulatory Visit: Payer: Self-pay | Admitting: Family Medicine

## 2019-11-04 NOTE — Telephone Encounter (Signed)
Please check with her Is she requesting to go back to dexilant 60 mg daily bc it works better than the 30 mg dose and better than omeprazole 20 mg ? Thanks

## 2019-11-05 NOTE — Telephone Encounter (Signed)
Okay to refill Dexilant 60 mg daily Discontinue omeprazole

## 2019-11-06 MED ORDER — DEXLANSOPRAZOLE 60 MG PO CPDR: 60.0000 mg | DELAYED_RELEASE_CAPSULE | Freq: Every day | ORAL | 0 refills | Status: DC

## 2019-11-07 ENCOUNTER — Observation Stay (HOSPITAL_BASED_OUTPATIENT_CLINIC_OR_DEPARTMENT_OTHER): Payer: Managed Care, Other (non HMO)

## 2019-11-07 ENCOUNTER — Encounter (HOSPITAL_COMMUNITY): Payer: Self-pay | Admitting: *Deleted

## 2019-11-07 ENCOUNTER — Emergency Department (HOSPITAL_COMMUNITY): Payer: Managed Care, Other (non HMO)

## 2019-11-07 ENCOUNTER — Observation Stay (HOSPITAL_COMMUNITY)
Admission: EM | Admit: 2019-11-07 | Discharge: 2019-11-08 | Disposition: A | Discharge status: Home / Self Care | Payer: Managed Care, Other (non HMO) | Attending: Internal Medicine | Admitting: Internal Medicine

## 2019-11-07 ENCOUNTER — Other Ambulatory Visit: Payer: Self-pay

## 2019-11-07 DIAGNOSIS — F419 Anxiety disorder, unspecified: Secondary | ICD-10-CM | POA: Diagnosis not present

## 2019-11-07 DIAGNOSIS — R1010 Upper abdominal pain, unspecified: Secondary | ICD-10-CM

## 2019-11-07 DIAGNOSIS — K589 Irritable bowel syndrome without diarrhea: Secondary | ICD-10-CM | POA: Diagnosis not present

## 2019-11-07 DIAGNOSIS — E876 Hypokalemia: Secondary | ICD-10-CM

## 2019-11-07 DIAGNOSIS — D72820 Lymphocytosis (symptomatic): Secondary | ICD-10-CM | POA: Diagnosis not present

## 2019-11-07 DIAGNOSIS — R001 Bradycardia, unspecified: Secondary | ICD-10-CM | POA: Diagnosis not present

## 2019-11-07 DIAGNOSIS — Z20828 Contact with and (suspected) exposure to other viral communicable diseases: Secondary | ICD-10-CM | POA: Diagnosis not present

## 2019-11-07 DIAGNOSIS — K219 Gastro-esophageal reflux disease without esophagitis: Secondary | ICD-10-CM | POA: Insufficient documentation

## 2019-11-07 DIAGNOSIS — Z87891 Personal history of nicotine dependence: Secondary | ICD-10-CM | POA: Insufficient documentation

## 2019-11-07 DIAGNOSIS — F329 Major depressive disorder, single episode, unspecified: Secondary | ICD-10-CM | POA: Insufficient documentation

## 2019-11-07 DIAGNOSIS — R55 Syncope and collapse: Secondary | ICD-10-CM | POA: Diagnosis not present

## 2019-11-07 DIAGNOSIS — Z79899 Other long term (current) drug therapy: Secondary | ICD-10-CM | POA: Diagnosis not present

## 2019-11-07 HISTORY — PX: TRANSTHORACIC ECHOCARDIOGRAM: SHX275

## 2019-11-07 HISTORY — DX: Syncope and collapse: R55

## 2019-11-07 LAB — CBC WITH DIFFERENTIAL/PLATELET
Abs Immature Granulocytes: 0.04 10*3/uL (ref 0.00–0.07)
Basophils Absolute: 0 10*3/uL (ref 0.0–0.1)
Basophils Relative: 0 %
Eosinophils Absolute: 0.2 10*3/uL (ref 0.0–0.5)
Eosinophils Relative: 1 %
HCT: 44 % (ref 36.0–46.0)
Hemoglobin: 14.4 g/dL (ref 12.0–15.0)
Immature Granulocytes: 0 %
Lymphocytes Relative: 19 %
Lymphs Abs: 2.8 10*3/uL (ref 0.7–4.0)
MCH: 31.6 pg (ref 26.0–34.0)
MCHC: 32.7 g/dL (ref 30.0–36.0)
MCV: 96.5 fL (ref 80.0–100.0)
Monocytes Absolute: 1.1 10*3/uL — ABNORMAL HIGH (ref 0.1–1.0)
Monocytes Relative: 7 %
Neutro Abs: 10.9 10*3/uL — ABNORMAL HIGH (ref 1.7–7.7)
Neutrophils Relative %: 73 %
Platelets: 254 10*3/uL (ref 150–400)
RBC: 4.56 MIL/uL (ref 3.87–5.11)
RDW: 12.7 % (ref 11.5–15.5)
WBC: 15 10*3/uL — ABNORMAL HIGH (ref 4.0–10.5)
nRBC: 0 % (ref 0.0–0.2)

## 2019-11-07 LAB — COMPREHENSIVE METABOLIC PANEL
ALT: 33 U/L (ref 0–44)
AST: 40 U/L (ref 15–41)
Albumin: 3.4 g/dL — ABNORMAL LOW (ref 3.5–5.0)
Alkaline Phosphatase: 52 U/L (ref 38–126)
Anion gap: 9 (ref 5–15)
BUN: 14 mg/dL (ref 6–20)
CO2: 25 mmol/L (ref 22–32)
Calcium: 8.7 mg/dL — ABNORMAL LOW (ref 8.9–10.3)
Chloride: 109 mmol/L (ref 98–111)
Creatinine, Ser: 0.74 mg/dL (ref 0.44–1.00)
GFR calc Af Amer: >60 mL/min (ref >60)
GFR calc non Af Amer: >60 mL/min (ref >60)
Glucose, Bld: 113 mg/dL — ABNORMAL HIGH (ref 70–99)
Potassium: 3.7 mmol/L (ref 3.5–5.1)
Sodium: 143 mmol/L (ref 135–145)
Total Bilirubin: 0.9 mg/dL (ref 0.3–1.2)
Total Protein: 5.8 g/dL — ABNORMAL LOW (ref 6.5–8.1)

## 2019-11-07 LAB — URINALYSIS, ROUTINE W REFLEX MICROSCOPIC
Bilirubin Urine: NEGATIVE
Glucose, UA: NEGATIVE mg/dL
Hgb urine dipstick: NEGATIVE
Ketones, ur: NEGATIVE mg/dL
Nitrite: NEGATIVE
Protein, ur: NEGATIVE mg/dL
Specific Gravity, Urine: 1.016 (ref 1.005–1.030)
pH: 5 (ref 5.0–8.0)

## 2019-11-07 LAB — ECHOCARDIOGRAM COMPLETE
Height: 71 in
Weight: 2792 oz

## 2019-11-07 LAB — TSH
TSH: 1.797 u[IU]/mL (ref 0.350–4.500)
TSH: 1.331 u[IU]/mL (ref 0.350–4.500)

## 2019-11-07 LAB — I-STAT BETA HCG BLOOD, ED (MC, WL, AP ONLY): I-stat hCG, quantitative: <5 m[IU]/mL (ref <5)

## 2019-11-07 LAB — SARS CORONAVIRUS 2 (TAT 6-24 HRS): SARS Coronavirus 2: NEGATIVE

## 2019-11-07 LAB — MAGNESIUM: Magnesium: 1.9 mg/dL (ref 1.7–2.4)

## 2019-11-07 MED ORDER — LEVOCETIRIZINE DIHYDROCHLORIDE 5 MG PO TABS: 5.0000 mg | ORAL_TABLET | Freq: Every evening | ORAL | Status: DC

## 2019-11-07 MED ORDER — MONTELUKAST SODIUM 10 MG PO TABS
10.0000 mg | ORAL_TABLET | Freq: Every day | ORAL | Status: DC
  Administered 2019-11-07: 10 mg via ORAL
  Filled 2019-11-07: qty 1

## 2019-11-07 MED ORDER — ONDANSETRON HCL 4 MG PO TABS: 4.0000 mg | ORAL_TABLET | Freq: Four times a day (QID) | ORAL | Status: DC | PRN

## 2019-11-07 MED ORDER — PANTOPRAZOLE SODIUM 40 MG PO TBEC
40.0000 mg | DELAYED_RELEASE_TABLET | Freq: Every day | ORAL | Status: DC
  Administered 2019-11-07 – 2019-11-08 (×2): 40 mg via ORAL
  Filled 2019-11-07: qty 2
  Filled 2019-11-07: qty 1

## 2019-11-07 MED ORDER — CYCLOSPORINE 0.05 % OP EMUL
1.0000 [drp] | Freq: Two times a day (BID) | OPHTHALMIC | Status: DC
  Administered 2019-11-07 – 2019-11-08 (×3): 1 [drp] via OPHTHALMIC
  Filled 2019-11-07 (×4): qty 30

## 2019-11-07 MED ORDER — LINACLOTIDE 145 MCG PO CAPS
145.0000 ug | ORAL_CAPSULE | Freq: Every day | ORAL | Status: DC
  Administered 2019-11-08: 145 ug via ORAL
  Filled 2019-11-07: qty 1

## 2019-11-07 MED ORDER — SODIUM CHLORIDE 0.9% FLUSH
3.0000 mL | Freq: Two times a day (BID) | INTRAVENOUS | Status: DC
  Administered 2019-11-07 (×2): 3 mL via INTRAVENOUS

## 2019-11-07 MED ORDER — ONDANSETRON HCL 4 MG/2ML IJ SOLN
4.0000 mg | Freq: Once | INTRAMUSCULAR | Status: AC
  Administered 2019-11-07: 4 mg via INTRAVENOUS
  Filled 2019-11-07: qty 2

## 2019-11-07 MED ORDER — ACETAMINOPHEN 325 MG PO TABS
650.0000 mg | ORAL_TABLET | Freq: Four times a day (QID) | ORAL | Status: DC | PRN
  Administered 2019-11-07: 650 mg via ORAL
  Filled 2019-11-07: qty 2

## 2019-11-07 MED ORDER — LACTATED RINGERS IV BOLUS
2000.0000 mL | Freq: Once | INTRAVENOUS | Status: AC
  Administered 2019-11-07: 2000 mL via INTRAVENOUS

## 2019-11-07 MED ORDER — POLYETHYLENE GLYCOL 3350 17 G PO PACK
17.0000 g | PACK | Freq: Every day | ORAL | Status: DC
  Filled 2019-11-07 (×2): qty 1

## 2019-11-07 MED ORDER — ACETAMINOPHEN 650 MG RE SUPP: 650.0000 mg | Freq: Four times a day (QID) | RECTAL | Status: DC | PRN

## 2019-11-07 MED ORDER — FLUOXETINE HCL 20 MG PO CAPS
20.0000 mg | ORAL_CAPSULE | Freq: Every day | ORAL | Status: DC
  Administered 2019-11-07 – 2019-11-08 (×2): 20 mg via ORAL
  Filled 2019-11-07 (×2): qty 1

## 2019-11-07 MED ORDER — FLUTICASONE PROPIONATE 50 MCG/ACT NA SUSP
2.0000 | Freq: Every day | NASAL | Status: DC
  Administered 2019-11-08: 2 via NASAL
  Filled 2019-11-07: qty 16

## 2019-11-07 MED ORDER — IOHEXOL 300 MG/ML  SOLN
100.0000 mL | Freq: Once | INTRAMUSCULAR | Status: AC | PRN
  Administered 2019-11-07: 100 mL via INTRAVENOUS

## 2019-11-07 MED ORDER — LORATADINE 10 MG PO TABS
10.0000 mg | ORAL_TABLET | Freq: Every evening | ORAL | Status: DC
  Administered 2019-11-07: 10 mg via ORAL
  Filled 2019-11-07: qty 1

## 2019-11-07 MED ORDER — ENOXAPARIN SODIUM 40 MG/0.4ML ~~LOC~~ SOLN
40.0000 mg | SUBCUTANEOUS | Status: DC
  Administered 2019-11-07: 40 mg via SUBCUTANEOUS
  Filled 2019-11-07: qty 0.4

## 2019-11-07 MED ORDER — LACTATED RINGERS IV SOLN
INTRAVENOUS | Status: DC
  Administered 2019-11-07 – 2019-11-08 (×2): via INTRAVENOUS

## 2019-11-07 MED ORDER — VALACYCLOVIR HCL 500 MG PO TABS
500.0000 mg | ORAL_TABLET | Freq: Every day | ORAL | Status: DC
  Administered 2019-11-07 – 2019-11-08 (×2): 500 mg via ORAL
  Filled 2019-11-07 (×2): qty 1

## 2019-11-07 MED ORDER — ONDANSETRON HCL 4 MG/2ML IJ SOLN
4.0000 mg | Freq: Four times a day (QID) | INTRAMUSCULAR | Status: DC | PRN
  Administered 2019-11-07: 4 mg via INTRAVENOUS
  Filled 2019-11-07: qty 2

## 2019-11-07 NOTE — ED Provider Notes (Signed)
Emergency Department Provider Note   I have reviewed the triage vital signs and the nursing notes.   HISTORY  Chief Complaint Abdominal Pain and Near Syncope   HPI [name] is a 58 y.o. female with medical problems as documented below presents to the emergency department today secondary to syncopal episodes.  Patient states she was in her normal state of health she went to the bathroom is urinating.  She started having some bilateral lower extremity cramping.  No other abnormalities but when she went to stand up she syncopized her husband heard the thud and went in there he said that she had loss of consciousness for less than a minute.  Once she woke up she was put back on the toilet which time she had diarrhea and syncopized again.  EMS was called and apparently she initially had some blood pressures 70s over 40s was given a liter of fluids and brought here for further evaluation with her blood pressure improving to the 90s. now complaining of RLQ pain. No chest pain, headache.   No other associated or modifying symptoms.    Past Medical History:  Diagnosis Date  . Allergic rhinitis   . Anxiety   . Back pain   . Contact lens/glasses fitting   . Depression   . Endometriosis   . Fatigue   . GERD (gastroesophageal reflux disease)   . Hiatal hernia   . History of cardiac catheterization 12/1990  . History of Ostium Secundum Atrial Septal Defect 1989   Status post repair in 1989/1990  . Hx of migraines   . IBS (irritable bowel syndrome)   . Incontinence   . Intestinal volvulus [facility]) May 2007  . Loss of appetite   . Onychomycosis   . Varicose veins     Patient Active Problem List   Diagnosis Date Noted  . Upper respiratory infection 10/03/2019  . Monoclonal B-cell lymphocytosis 09/20/2019  . Neutropenia ([place]) 09/01/2019  . Allergic rhinitis   . OSA (obstructive sleep apnea) 02/20/2019  . HSV (herpes simplex virus) anogenital infection 08/31/2018  . Chronic  constipation 05/31/2018  . Eczema 01/09/2016  . GERD (gastroesophageal reflux disease)-probable paroxsysmal relaxation LES 03/14/2012  . Hair loss 06/29/2011  . GERD 02/28/2011  . VARICOSE VEINS, LOWER EXTREMITIES 01/08/2008  . INTESTINAL VOLVULUS, LARGE BOWEL 01/08/2008  . Anxiety 01/07/2008  . IBS (irritable bowel syndrome) 01/07/2008  . MIGRAINES, HX OF 01/07/2008  . ATRIAL SEPTAL DEFECT - Status Post Repair 01/08/1988    Past Surgical History:  Procedure Laterality Date  . ABDOMINAL HYSTERECTOMY  2004  . ASD REPAIR, SECUNDUM  1990   Dr. Servando Snare  . BALLOON SINUPLASTY Left   . EYE SURGERY     muscle reattachment 02/2017  . hysterectomy for severe endometriosis    . NASAL SEPTUM SURGERY     Dr. Georgia Lopes  . OPEN REDUCTION INTERNAL FIXATION (ORIF) DISTAL RADIAL FRACTURE Right 06/20/2017   Procedure: OPEN REDUCTION INTERNAL FIXATION (ORIF) DISTAL RADIAL FRACTURE;  Surgeon: Leanora Cover, MD;  Location: [place];  Service: Orthopedics;  Laterality: Right;  . right ankle surgery  2010  . right leg fracture with surgery     done with ankle surgery  . TRANSTHORACIC ECHOCARDIOGRAM   January 2012   Normal LV size and function, EF greater than 55%. Mild LA dilation. No intra-atrial shunt with bubble study. Normal pulmonary pressures. Only trace to mild mitral and tricuspid regurgitation.  . varicose laser vein surgery  2009  Current Outpatient Rx  . Order #: [id number] Class: Historical Med  . Order #: [id number] Class: Historical Med  . Order #: [id number] Class: Historical Med  . Order #: [id number] Class: Historical Med  . Order #: [id number] Class: Historical Med  . Order #: [id number] Class: Normal  . Order #: [id number] Class: Historical Med  . Order #: [id number] Class: Normal  . Order #: [id number] Class: Normal  . Order #: [id number] Class: Historical Med  . Order #: [id number] Class: Normal  . Order #: [id number] Class: Historical Med  . Order #: [id number] Class: Historical Med   . Order #: [id number] Class: Historical Med  . Order #: [id number] Class: Historical Med  . Order #: [id number] Class: Normal    Allergies Chlordiazepoxide-clidinium, Codeine, and Penicillins  Family History  Problem Relation Age of Onset  . Diabetes Mother   . Colon cancer Paternal Grandfather   . Heart disease Maternal Grandfather   . Prostate cancer Maternal Grandfather   . Lung cancer Maternal Grandmother        with mets to the brain  . Clotting disorder Maternal Grandmother   . Clotting disorder Sister   . Heart disease Sister     Social History Social History   Tobacco Use  . Smoking status: Former Smoker    Packs/day: 1.00    Years: 10.00    Pack years: 10.00    Types: Cigarettes    Quit date: 12/12/1988    Years since quitting: 30.9  . Smokeless tobacco: Never Used  Substance Use Topics  . Alcohol use: Yes    Comment: social use/wine  . Drug use: No    Review of Systems  All other systems negative except as documented in the HPI. All pertinent positives and negatives as reviewed in the HPI. ____________________________________________   PHYSICAL EXAM:  VITAL SIGNS: ED Triage Vitals  Enc Vitals Group     BP 11/07/19 0523 105/66     Pulse Rate 11/07/19 0523 (!) 57     Resp --      Temp 11/07/19 0523 98.3 F (36.8 C)     Temp Source 11/07/19 0523 Oral     SpO2 11/07/19 0523 95 %     Weight 11/07/19 0523 175 lb (79.4 kg)     Height 11/07/19 0523 5\' 11"  (1.803 m)    Constitutional: Alert and oriented. Well appearing and in no acute distress. Eyes: Conjunctivae are normal. PERRL. EOMI. Head: Atraumatic. Nose: No congestion/rhinnorhea. Mouth/Throat: Mucous membranes are moist.  Oropharynx non-erythematous. Neck: No stridor.  No meningeal signs.   Cardiovascular: Normal rate, regular rhythm. Good peripheral circulation. Grossly normal heart sounds.  Episodic bradycardia to the 30's. Respiratory: Normal respiratory effort.  No retractions. Lungs CTAB.  Gastrointestinal: Soft and nontender. No distention.  Musculoskeletal: No lower extremity tenderness nor edema. No gross deformities of extremities. Neurologic:  Normal speech and language. No gross focal neurologic deficits are appreciated.  Skin:  Skin is warm, dry and intact. No rash noted.   ____________________________________________   LABS (all labs ordered are listed, but only abnormal results are displayed)  Labs Reviewed  CBC WITH DIFFERENTIAL/PLATELET - Abnormal; Notable for the following components:      Result Value   WBC 15.0 (*)    Neutro Abs 10.9 (*)    Monocytes Absolute 1.1 (*)    All other components within normal limits  COMPREHENSIVE METABOLIC PANEL - Abnormal; Notable for the following components:   Glucose, Bld 113 (*)    Calcium 8.7 (*)    Total Protein 5.8 (*)    Albumin  3.4 (*)    All other components within normal limits  MAGNESIUM  CBC WITH DIFFERENTIAL/PLATELET  URINALYSIS, ROUTINE W REFLEX MICROSCOPIC  TSH  CBG MONITORING, ED  I-STAT BETA HCG BLOOD, ED ([facility], [facility], [facility] ONLY)  CBG MONITORING, ED   ____________________________________________  EKG  My ECG Read Indication: syncope EKG was personally contemporaneously reviewed by myself. Rate: 59 PR Interval: 190 QRS duration: 109 QT/QTC: 480/476 Axis: normal EKG: normal EKG, normal sinus rhythm, unchanged from previous tracings. Other significant findings: none            ____________________________________________  RADIOLOGY  Dg Chest 1 View  Result Date: 11/07/2019 CLINICAL DATA:  Syncope x2 today EXAM: CHEST  1 VIEW COMPARISON:  Radiograph 02/28/2011 FINDINGS: Chronically coarsened interstitial changes appear somewhat more pronounced on today's exam though this may be related to diminished lung volumes compared to the hyperventilation on comparison studies. No consolidation, pneumothorax or effusion. Postsurgical changes related to prior CABG including intact and aligned  sternotomy wires and multiple surgical clips projecting over the mediastinum. The aorta is calcified. The remaining cardiomediastinal contours are unremarkable. No acute osseous or soft tissue abnormality. Degenerative changes are present in the imaged spine and shoulders. IMPRESSION: 1. Chronically coarsened interstitial changes appear somewhat more pronounced on today's exam though this may be related to diminished lung volumes from comparison studies. 2. No other acute cardiopulmonary abnormality. 3. Prior CABG. 4.  Aortic Atherosclerosis (ICD10-I70.0). Electronically Signed   By: Lovena Le M.D.   On: 11/07/2019 06:27    ____________________________________________   PROCEDURES  Procedure(s) performed:   Procedures   ____________________________________________   INITIAL IMPRESSION / ASSESSMENT AND PLAN / ED COURSE  Symptoms sound like vasovagal syncope probably related to the hypotension.  Cramping is likely related to hypotension as well.  However she is having episodic bradycardia.  No chest pain or other EKG changes to indicate need for troponin ordered to evaluate for ACS.  She has some fluids which is helped her blood pressure.  We will continue some fluid hydration, check labs to include electrolytes and magnesium as she recently was told to stop her magnesium and calcium.  She does have some right lower quadrant abdominal pain which very well could be from trauma of falling and there was a step inside the shower.  We will get a CT scan to evaluate for that and also to evaluate aorta.  Otherwise we will continue to monitor and likely discuss with cardiology.  Cardiology to see.   Care transferred pending ct and cardiology consult, likely admission.   Pertinent labs & imaging results that were available during my care of the patient were reviewed by me and considered in my medical decision making (see chart for details). ____________________________________________  FINAL  CLINICAL IMPRESSION(S) / ED DIAGNOSES  Final diagnoses:  Bradycardia  Syncope, unspecified syncope type    MEDICATIONS GIVEN DURING THIS VISIT:  Medications  lactated ringers bolus 2,000 mL (2,000 mLs Intravenous New Bag/Given 11/07/19 0710)  ondansetron (ZOFRAN) injection 4 mg (4 mg Intravenous Given 11/07/19 0710)    NEW OUTPATIENT MEDICATIONS STARTED DURING THIS VISIT:  New Prescriptions   No medications on file    Note:  This note was prepared with assistance of Dragon voice recognition software. Occasional wrong-word or sound-a-like substitutions may have occurred due to the inherent limitations of voice recognition software.   Vannie Hilgert, Corene Cornea, MD 11/07/19 [phone]

## 2019-11-07 NOTE — H&P (Signed)
History and Physical    DAJON HUTCHERSON [id number] DOB: 1961/02/09 DOA: 11/07/2019  PCP: Lucille Passy, MD Consultants:  Maylon Peppers - oncology; Pyrtle - GI; Ellyn Hack - cardiology Patient coming from:  Home - lives with husband; NOK: Husband, [phone]  Chief Complaint: Syncope  HPI: [name] is a 58 y.o. female with medical history significant of IBS; endometriosis; monoclonal B cell lymphocytosis; and depression/anxiety presenting with syncope.  She got up overnight and apparently passed out into the floor.  He heard her fall and checked on her; she was in the floor.  She was unconscious when he found  Her and she came to within a few minutes.  She was sweating profusely, felt nauseated.  He got her up and sat her back on the toilet and her head lolled back and her eyes rolled back and she lost consciousness again for maybe 10 seconds.  Since then, she has not had good balance and feels very fatigued.  No h/o similar.  She did have diarrhea when she sat back on the toilet.  They stayed up late, until midnight late night.  No problems yesterday.  She has a h/o bradycardia in the past.  Her BP was very low at the house, 70/40, 83/48.  HR was in the 40s at that time.  She was given 1L IVF and she remained orthostatic.  Now she feels weak and tired.  SHe has had intermittent nausea.  She did have RLQ abdominal pain, as well, which she thought may be related to the fall and abdominal wall trauma.   ED Course: Syncope - bradycardia vs. Vagal x multiple episodes.  Hypotensive, cardiology saw and recommends fluids.  Abd CT negative.  Review of Systems: As per HPI; otherwise review of systems reviewed and negative.   Ambulatory Status:  Ambulates without assistance  Past Medical History:  Diagnosis Date   Allergic rhinitis    Anxiety    Back pain    Contact lens/glasses fitting    Depression    Endometriosis    Fatigue    GERD (gastroesophageal reflux disease)    Hiatal  hernia    History of Ostium Secundum Atrial Septal Defect 1989   Status post repair in 1989/1990   Hx of migraines    IBS (irritable bowel syndrome)    Incontinence    Intestinal volvulus ([place]) May 2007   Loss of appetite    Onychomycosis    Varicose veins     Past Surgical History:  Procedure Laterality Date   ABDOMINAL HYSTERECTOMY  2004   ASD REPAIR, SECUNDUM  1990   Dr. Jackie Plum SINUPLASTY Left    EYE SURGERY     muscle reattachment 02/2017   hysterectomy for severe endometriosis     NASAL SEPTUM SURGERY     Dr. Georgia Lopes   OPEN REDUCTION INTERNAL FIXATION (ORIF) DISTAL RADIAL FRACTURE Right 06/20/2017   Procedure: OPEN REDUCTION INTERNAL FIXATION (ORIF) DISTAL RADIAL FRACTURE;  Surgeon: Leanora Cover, MD;  Location: [facility];  Service: Orthopedics;  Laterality: Right;   right ankle surgery  2010   right leg fracture with surgery     done with ankle surgery   TRANSTHORACIC ECHOCARDIOGRAM   January 2012   Normal LV size and function, EF greater than 55%. Mild LA dilation. No intra-atrial shunt with bubble study. Normal pulmonary pressures. Only trace to mild mitral and tricuspid regurgitation.   varicose laser vein surgery  2009    Social History  Socioeconomic History   Marital status: Married    Spouse name: Richard   Number of children: Not on file   Years of education: Not on file   Highest education level: Not on file  Occupational History   Occupation: remodel houses    Employer: [employer] resource strain: Not on file   Food insecurity    Worry: Not on file    Inability: Not on file   Transportation needs    Medical: Not on file    Non-medical: Not on file  Tobacco Use   Smoking status: Former Smoker    Packs/day: 1.00    Years: 10.00    Pack years: 10.00    Types: Cigarettes    Quit date: 12/12/1988    Years since quitting: 30.9   Smokeless tobacco: Never  Used  Substance and Sexual Activity   Alcohol use: Yes    Comment: social use/wine   Drug use: No   Sexual activity: Not on file  Lifestyle   Physical activity    Days per week: Not on file    Minutes per session: Not on file   Stress: Not on file  Relationships   Social connections    Talks on phone: Not on file    Gets together: Not on file    Attends religious service: Not on file    Active member of club or organization: Not on file    Attends meetings of clubs or organizations: Not on file    Relationship status: Not on file   Intimate partner violence    Fear of current or ex partner: Not on file    Emotionally abused: Not on file    Physically abused: Not on file    Forced sexual activity: Not on file  Other Topics Concern   Not on file  Social History Narrative   They have 2 children and at least two grandchildren. She does all her activities of daily living. She works as a Midwife for [employer].     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 She does other days of the gym and she is able to at least 60 minutes a day 5 days a week.   She is a former smoker who quit in 1990. This was after smoking a pack a day for 10 years. She drinks social alcohol on occasion.    Allergies  Allergen Reactions   Chlordiazepoxide-Clidinium     "loopy"   Codeine     REACTION: nausea,vomitting and dizziness   Penicillins     REACTION: hives ---taken as a child    Family History  Problem Relation Age of Onset   Diabetes Mother    Colon cancer Paternal Grandfather    Heart disease Maternal Grandfather    Prostate cancer Maternal Grandfather    Lung cancer Maternal Grandmother        with mets to the brain   Clotting disorder Maternal Grandmother    Clotting disorder Sister    Heart disease Sister     Prior to Admission medications   Medication Sig  Start Date End Date Taking? Authorizing Provider  Ascorbic Acid (VITA-C PO) Take by mouth.    [provider]  Azelastine HCl 137 MCG/SPRAY SOLN INSTILL 1 PUFF IN EACH NOSTRIL ONCE A DAY NASALLY 30 DAYS 09/12/19  [provider]  b complex vitamins capsule Take 1 capsule by mouth daily.    [provider]  Calcium Carb-Cholecalciferol (CALTRATE 600+D) 600-800 MG-UNIT TABS Take 1 tablet by mouth 2 (two) times daily.    [provider]  cycloSPORINE (RESTASIS) 0.05 % ophthalmic emulsion 1 drop 2 (two) times daily.    [provider]  dexlansoprazole (DEXILANT) 60 MG capsule Take 1 capsule (60 mg total) by mouth daily. Pharmacy-please d/c rx for omeprazole or Dexilant 30 mg 11/06/19   Pyrtle, Lajuan Lines, MD  EPIPEN 2-PAK 0.3 MG/0.3ML SOAJ injection  04/01/14   [provider]  FLUoxetine (PROZAC) 20 MG capsule TAKE 1 CAPSULE BY MOUTH EVERY DAY 11/04/19   Lucille Passy, MD  fluticasone Asencion Islam) 50 MCG/ACT nasal spray instill 2 sprays into each nostril once daily 08/18/17   Saguier, Percell Miller, PA-C  levocetirizine (XYZAL) 5 MG tablet  04/27/14   [provider]  LINZESS 145 MCG CAPS capsule TAKE 1 CAPSULE (145 MCG TOTAL) BY MOUTH DAILY BEFORE BREAKFAST. 07/29/19   Pyrtle, Lajuan Lines, MD  magnesium oxide (MAG-OX) 400 MG tablet Take 400 mg by mouth daily.    [provider]  montelukast (SINGULAIR) 10 MG tablet  04/27/14   [provider]  Multiple Vitamin (MULTIVITAMINS PO) Take 1 tablet by mouth daily.      [provider]  polyethylene glycol (MIRALAX / GLYCOLAX) packet Take 17 g by mouth daily.    [provider]  valACYclovir (VALTREX) 500 MG tablet Take 1 tablet (500 mg total) by mouth daily. 06/26/19   Lucille Passy, MD    Physical Exam: Vitals:   11/07/19 1100 11/07/19 1236 11/07/19 1305 11/07/19 1309  BP: 99/61 111/65 106/64   Pulse:  89 79   Resp: 20 18 20    Temp:   99.4 F (37.4 C)   TempSrc:   Oral     SpO2:  96% 94%   Weight:    79.2 kg  Height:    5\' 11"  (1.803 m)      General:  Appears calm and comfortable but fatigued and is in NAD  Eyes:  PERRL, EOMI, normal lids, iris  ENT:  grossly normal hearing, lips & tongue, mmm; appropriate dentition  Neck:  no LAD, masses or thyromegaly; no carotid bruits  Cardiovascular:  RRR, no m/r/g. No LE edema.   Respiratory:   CTA bilaterally with no wheezes/rales/rhonchi.  Normal respiratory effort.  Abdomen:  soft, NT, ND, NABS  Skin:  no rash or induration seen on limited exam  Musculoskeletal:  grossly normal tone BUE/BLE, good ROM, no bony abnormality  Psychiatric:  blunted mood and affect, speech fluent and appropriate, AOx3  Neurologic:  CN 2-12 grossly intact, moves all extremities in coordinated fashion, sensation intact    Radiological Exams on Admission: Dg Chest 1 View  Result Date: 11/07/2019 CLINICAL DATA:  Syncope x2 today EXAM: CHEST  1 VIEW COMPARISON:  Radiograph 02/28/2011 FINDINGS: Chronically coarsened interstitial changes appear somewhat more pronounced on today's exam though this may be related to diminished lung volumes compared to the hyperventilation on comparison studies. No consolidation, pneumothorax or effusion. Postsurgical changes related to prior CABG including intact and aligned sternotomy wires and multiple surgical clips projecting over the mediastinum. The aorta is calcified. The remaining cardiomediastinal contours are unremarkable. No acute osseous or soft tissue abnormality. Degenerative changes are present in the imaged spine and shoulders. IMPRESSION: 1. Chronically coarsened interstitial changes appear somewhat more pronounced on today's  exam though this may be related to diminished lung volumes from comparison studies. 2. No other acute cardiopulmonary abnormality. 3. Prior CABG. 4.  Aortic Atherosclerosis (ICD10-I70.0). Electronically Signed   By: Lovena Le M.D.   On: 11/07/2019 06:27   Ct  Abdomen Pelvis W Contrast  Result Date: 11/07/2019 CLINICAL DATA:  Right lower quadrant pain, nausea, syncope EXAM: CT ABDOMEN AND PELVIS WITH CONTRAST TECHNIQUE: Multidetector CT imaging of the abdomen and pelvis was performed using the standard protocol following bolus administration of intravenous contrast. CONTRAST:  137mL OMNIPAQUE IOHEXOL 300 MG/ML  SOLN COMPARISON:  05/04/2006 FINDINGS: Lower chest: Mild dependent atelectasis in the bilateral lower lobes. Hepatobiliary: Liver is within normal limits. Gallbladder is unremarkable. No intrahepatic or extrahepatic ductal dilatation. Pancreas: Within normal limits. Spleen: Within normal limits Adrenals/Urinary Tract: Adrenal glands are within normal limits. Kidneys are within limits. No hydronephrosis. Bladder is within normal limits. Stomach/Bowel: Stomach is within normal limits. No evidence of bowel obstruction. Normal appendix (coronal image 44). Mild sigmoid diverticulosis, without evidence of diverticulitis. Vascular/Lymphatic: No evidence of abdominal aortic aneurysm. No suspicious abdominopelvic lymphadenopathy. Reproductive: Status post hysterectomy. Left ovary is within normal limits. No right adnexal mass. Other: No abdominopelvic ascites. Musculoskeletal: Mild degenerative changes at L5-S1. IMPRESSION: Negative CT abdomen/pelvis. Normal appendix. No CT findings to account for the patient's right lower quadrant abdominal pain. Electronically Signed   By: Julian Hy M.D.   On: 11/07/2019 09:32    EKG: Independently reviewed.  79 - NSR with rate 59; no evidence of acute ischemia   Labs on Admission: I have personally reviewed the available labs and imaging studies at the time of the admission.  Pertinent labs:   Glucose 113 Albumin 3.4 WBC 15.0 HCG <5 UA: large LE, rare bacteria TSH: 1.797  Assessment/Plan Principal Problem:   Syncope Active Problems:   Monoclonal B-cell lymphocytosis   Bradycardia   Syncope:    -Etiology appears most likely micturitional/vasovagal/orthostatic by history -Will monitor on telemetry -Orthostatic vital signs in AM -2d echo -Neuro checks  -check A1c, FLP, TSH -LR at 75 cc/hr -She takes a multitude of vitamins including vitamin C; B complex; calcium/D; magnesium; MVI; and Co-Q10.  She will discuss this with her PCP at f/u -Assuming overnight clinical stability, likely d/c to home in AM  Bradycardia -Patient with bradycardia by EMS into the 40s and reported h/o this issue in the past -HR 59 on initial EKG and as low as 57 in the ER -This morning's bradycardia may have actually been the result of vasovagal syncope, although the syncope could also have resulted from bradycardia -Patient has been seen by cardiology, Dr. Harl Bowie -Echo pending  Monoclonal lymphocytosis -Undergoing observation by oncology  Abdominal pain with diarrhea -Not present until after syncopal event -Unremarkable CT -Follow for now without further intervention    Note: This patient has been tested and is pending for the novel coronavirus COVID-19.  DVT prophylaxis:  Lovenox  Code Status:  Full - confirmed with patient Family Communication: Husband was present throughout evaluation  Disposition Plan:  Home once clinically improved Consults called: Cardiology  Admission status: It is my clinical opinion that referral for OBSERVATION is reasonable and necessary in this patient based on the above information provided. The aforementioned taken together are felt to place the patient at high risk for further clinical deterioration. However it is anticipated that the patient may be medically stable for discharge from the hospital within 24 to 48 hours.    Karmen Bongo MD [facility]   How to contact the [facility] Attending or Consulting provider [place] or covering provider during after hours [place], for this patient?  1. Check the care team in [facility] and look for a) attending/consulting [facility]  provider listed and b) the [facility] team listed 2. Log into [email or web address] and use [place]'s universal password to access. If you do not have the password, please contact the hospital operator. 3. Locate the [facility] provider you are looking for under [facility] and page to a number that you can be directly reached. 4. If you still have difficulty reaching the provider, please page the [facility] (Director on Call) for the Hospitalists listed on amion for assistance.   11/07/2019, 1:17 PM

## 2019-11-07 NOTE — ED Triage Notes (Signed)
Patient presents to ed via [facility] from home states she felt fine when going to bed, woke up at 3 am to go to the bathroom to urinate after urinating she had a syncopal episode and her husband found her, upon arrival patient c/o right lower quad pain, describes and dull in nature.

## 2019-11-07 NOTE — ED Provider Notes (Signed)
11:14 AM Patient awake, alert, vital signs improved since arrival.  She is aware of CT findings.  I discussed her presentation with her internal medicine colleagues for admission as well.   Carmin Muskrat, MD 11/07/19 1115

## 2019-11-07 NOTE — ED Notes (Signed)
Husband #: [phone]

## 2019-11-07 NOTE — Consult Note (Signed)
Cardiology Consultation:   Patient ID: [name] MRN: [id number]; DOB: 1961/10/14  Admit date: 11/07/2019 Date of Consult: 11/07/2019  Primary Care Provider: Lucille Passy, MD Primary Cardiologist: Dr Glenetta Hew Primary Electrophysiologist:  None    Patient Profile:   [name] is a 58 y.o. female with a hx of anxiety/depression who is being seen today for the evaluation of syncope at the request of Dr Dayna Barker.  History of Present Illness:   [name] 58 yo female hisotry of anxiety/depression, GERD, ASD repair, IBS admitted with abdominal pain and syncope.  She reports feeling well throughout the week. Woke up this AM around 330 AM with leg cramps, needing to urinate. Walked to bathroom without any issues. After urinating while standing had a syncopal episode fell to the floor. Husband heart a loud sound and came to find her unresponsive. She came too fairly quickly. Upon coming too had strong urge to have a bowel movement, sat back on the commode. Had water loose stool, repeat episode of syncope. Husband then called EMS. Upon EMS evaluation was found to be hypotensive, improved with IVFs.    K 3.7 Cr 0.74 BUN 14 Mg 1.9 preg test neg CBC pending CXR chronic coarsened intersitial findings Mild sinus brady at 59    Heart Pathway Score:     Past Medical History:  Diagnosis Date  . Allergic rhinitis   . Anxiety   . Back pain   . Contact lens/glasses fitting   . Depression   . Endometriosis   . Fatigue   . GERD (gastroesophageal reflux disease)   . Hiatal hernia   . History of cardiac catheterization 12/1990  . History of Ostium Secundum Atrial Septal Defect 1989   Status post repair in 1989/1990  . Hx of migraines   . IBS (irritable bowel syndrome)   . Incontinence   . Intestinal volvulus [facility]) May 2007  . Loss of appetite   . Onychomycosis   . Varicose veins     Past Surgical History:  Procedure Laterality Date  . ABDOMINAL HYSTERECTOMY  2004   . ASD REPAIR, SECUNDUM  1990   Dr. Servando Snare  . BALLOON SINUPLASTY Left   . EYE SURGERY     muscle reattachment 02/2017  . hysterectomy for severe endometriosis    . NASAL SEPTUM SURGERY     Dr. Georgia Lopes  . OPEN REDUCTION INTERNAL FIXATION (ORIF) DISTAL RADIAL FRACTURE Right 06/20/2017   Procedure: OPEN REDUCTION INTERNAL FIXATION (ORIF) DISTAL RADIAL FRACTURE;  Surgeon: Leanora Cover, MD;  Location: [facility];  Service: Orthopedics;  Laterality: Right;  . right ankle surgery  2010  . right leg fracture with surgery     done with ankle surgery  . TRANSTHORACIC ECHOCARDIOGRAM   January 2012   Normal LV size and function, EF greater than 55%. Mild LA dilation. No intra-atrial shunt with bubble study. Normal pulmonary pressures. Only trace to mild mitral and tricuspid regurgitation.  . varicose laser vein surgery  2009    Inpatient Medications: Scheduled Meds:  Continuous Infusions:  PRN Meds:   Allergies:    Allergies  Allergen Reactions  . Chlordiazepoxide-Clidinium     "loopy"  . Codeine     REACTION: nausea,vomitting and dizziness  . Penicillins     REACTION: hives ---taken as a child    Social History:   Social History   Socioeconomic History  . Marital status: Married    Spouse name: Richard  . Number of children: Not  on file  . Years of education: Not on file  . Highest education level: Not on file  Occupational History  . Occupation: Scientist, clinical (histocompatibility and immunogenetics): [employer]  . Financial resource strain: Not on file  . Food insecurity    Worry: Not on file    Inability: Not on file  . Transportation needs    Medical: Not on file    Non-medical: Not on file  Tobacco Use  . Smoking status: Former Smoker    Packs/day: 1.00    Years: 10.00    Pack years: 10.00    Types: Cigarettes    Quit date: 12/12/1988    Years since quitting: 30.9  . Smokeless tobacco: Never Used  Substance and Sexual Activity  . Alcohol use: Yes     Comment: social use/wine  . Drug use: No  . Sexual activity: Not on file  Lifestyle  . Physical activity    Days per week: Not on file    Minutes per session: Not on file  . Stress: Not on file  Relationships  . Social Herbalist on phone: Not on file    Gets together: Not on file    Attends religious service: Not on file    Active member of club or organization: Not on file    Attends meetings of clubs or organizations: Not on file    Relationship status: Not on file  . Intimate partner violence    Fear of current or ex partner: Not on file    Emotionally abused: Not on file    Physically abused: Not on file    Forced sexual activity: Not on file  Other Topics Concern  . Not on file  Social History Narrative   They have 2 children and at least two grandchildren. She does all her activities of daily living. She works as a Midwife for [employer].     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 She does other days of the gym and she is able to at least 60 minutes a day 5 days a week.   She is a former smoker who quit in 1990. This was after smoking a pack a day for 10 years. She drinks social alcohol on occasion.    Family History:    Family History  Problem Relation Age of Onset  . Diabetes Mother   . Colon cancer Paternal Grandfather   . Heart disease Maternal Grandfather   . Prostate cancer Maternal Grandfather   . Lung cancer Maternal Grandmother        with mets to the brain  . Clotting disorder Maternal Grandmother   . Clotting disorder Sister   . Heart disease Sister      ROS:  Please see the history of present illness.  All other ROS reviewed and negative.     Physical Exam/Data:   Vitals:   11/07/19 0534 11/07/19 0709 11/07/19 0715 11/07/19 0730  BP:  93/66 104/65 96/70  Pulse:  78 70 71  Resp:  19 (!) 23 (!) 24  Temp:      TempSrc:       SpO2:  97% 94% 95%  Weight: 79.4 kg     Height: 5\' 11"  (1.803 m)      No intake or output data in the 24 hours ending  11/07/19 0801 Last 3 Weights 11/07/2019 11/07/2019 10/25/2019  Weight (lbs) 175 lb 175 lb 178 lb  Weight (kg) 79.379 kg 79.379 kg 80.74 kg     Body mass index is 24.41 kg/m.  General:  Well nourished, well developed, in no acute distress HEENT: normal Lymph: no adenopathy Neck: no JVD Endocrine:  No thryomegaly Vascular: No carotid bruits; FA pulses 2+ bilaterally without bruits  Cardiac:  normal S1, S2; RRR, 2/6 systolic murmur at apex Lungs:  clear to auscultation bilaterally, no wheezing, rhonchi or rales  Abd: soft, nontender, no hepatomegaly. RLQ is tender to palpation.   Ext: no edema Musculoskeletal:  No deformities, BUE and BLE strength normal and equal Skin: warm and dry  Neuro:  CNs 2-12 intact, no focal abnormalities noted Psych:  Normal affect     Laboratory Data:  High Sensitivity Troponin:  No results for input(s): TROPONINIHS in the last 720 hours.   Chemistry Recent Labs  Lab 11/07/19 0542  NA 143  K 3.7  CL 109  CO2 25  GLUCOSE 113*  BUN 14  CREATININE 0.74  CALCIUM 8.7*  GFRNONAA >60  GFRAA >60  ANIONGAP 9    Recent Labs  Lab 11/07/19 0542  PROT 5.8*  ALBUMIN 3.4*  AST 40  ALT 33  ALKPHOS 52  BILITOT 0.9   Hematology Recent Labs  Lab 11/07/19 0542  WBC 15.0*  RBC 4.56  HGB 14.4  HCT 44.0  MCV 96.5  MCH 31.6  MCHC 32.7  RDW 12.7  PLT 254   BNPNo results for input(s): BNP, PROBNP in the last 168 hours.  DDimer No results for input(s): DDIMER in the last 168 hours.   Radiology/Studies:  Dg Chest 1 View  Result Date: 11/07/2019 CLINICAL DATA:  Syncope x2 today EXAM: CHEST  1 VIEW COMPARISON:  Radiograph 02/28/2011 FINDINGS: Chronically coarsened interstitial changes appear somewhat more pronounced on today's exam though this may be related to diminished lung volumes compared to the hyperventilation on  comparison studies. No consolidation, pneumothorax or effusion. Postsurgical changes related to prior CABG including intact and aligned sternotomy wires and multiple surgical clips projecting over the mediastinum. The aorta is calcified. The remaining cardiomediastinal contours are unremarkable. No acute osseous or soft tissue abnormality. Degenerative changes are present in the imaged spine and shoulders. IMPRESSION: 1. Chronically coarsened interstitial changes appear somewhat more pronounced on today's exam though this may be related to diminished lung volumes from comparison studies. 2. No other acute cardiopulmonary abnormality. 3. Prior CABG. 4.  Aortic Atherosclerosis (ICD10-I70.0). Electronically Signed   By: Lovena Le M.D.   On: 11/07/2019 06:27    Assessment and Plan:   1. Syncope - history would suggest vasovagal syncope. Has had some intermittent bradyacrdia to high 40s in the ER, no heart block. Ongoing nausea and some abdominal pain, I suspect ongoing increased vagal tone.  - follow telemetry - check orthostatics - check echocardiogram - may consider outpatient monitor pending inpatient workup.     2. Abdominal pain - ongoing workup per primary team, ER has ordered a CT scan.      For questions or updates, please contact [place] Please consult [email or web address] for contact info under     Signed, Carlyle Dolly, MD  11/07/2019 8:01 AM

## 2019-11-08 ENCOUNTER — Other Ambulatory Visit: Payer: Self-pay | Admitting: Student

## 2019-11-08 DIAGNOSIS — R001 Bradycardia, unspecified: Secondary | ICD-10-CM | POA: Diagnosis not present

## 2019-11-08 DIAGNOSIS — R1010 Upper abdominal pain, unspecified: Secondary | ICD-10-CM

## 2019-11-08 DIAGNOSIS — R55 Syncope and collapse: Secondary | ICD-10-CM

## 2019-11-08 DIAGNOSIS — D72820 Lymphocytosis (symptomatic): Secondary | ICD-10-CM | POA: Diagnosis not present

## 2019-11-08 DIAGNOSIS — E876 Hypokalemia: Secondary | ICD-10-CM

## 2019-11-08 LAB — BASIC METABOLIC PANEL
Anion gap: 9 (ref 5–15)
BUN: 9 mg/dL (ref 6–20)
CO2: 26 mmol/L (ref 22–32)
Calcium: 8.2 mg/dL — ABNORMAL LOW (ref 8.9–10.3)
Chloride: 104 mmol/L (ref 98–111)
Creatinine, Ser: 0.65 mg/dL (ref 0.44–1.00)
GFR calc Af Amer: >60 mL/min (ref >60)
GFR calc non Af Amer: >60 mL/min (ref >60)
Glucose, Bld: 121 mg/dL — ABNORMAL HIGH (ref 70–99)
Potassium: 3.4 mmol/L — ABNORMAL LOW (ref 3.5–5.1)
Sodium: 139 mmol/L (ref 135–145)

## 2019-11-08 LAB — CBC
HCT: 38.7 % (ref 36.0–46.0)
Hemoglobin: 13 g/dL (ref 12.0–15.0)
MCH: 31.3 pg (ref 26.0–34.0)
MCHC: 33.6 g/dL (ref 30.0–36.0)
MCV: 93.3 fL (ref 80.0–100.0)
Platelets: 229 10*3/uL (ref 150–400)
RBC: 4.15 MIL/uL (ref 3.87–5.11)
RDW: 12.8 % (ref 11.5–15.5)
WBC: 9 10*3/uL (ref 4.0–10.5)
nRBC: 0 % (ref 0.0–0.2)

## 2019-11-08 LAB — LIPID PANEL
Cholesterol: 123 mg/dL (ref 0–200)
HDL: 34 mg/dL — ABNORMAL LOW (ref >40)
LDL Cholesterol: 77 mg/dL (ref 0–99)
Total CHOL/HDL Ratio: 3.6 RATIO
Triglycerides: 58 mg/dL (ref <150)
VLDL: 12 mg/dL (ref 0–40)

## 2019-11-08 LAB — HEMOGLOBIN A1C
Hgb A1c MFr Bld: 5.6 % (ref 4.8–5.6)
Mean Plasma Glucose: 114.02 mg/dL

## 2019-11-08 MED ORDER — POTASSIUM CHLORIDE CRYS ER 20 MEQ PO TBCR
40.0000 meq | EXTENDED_RELEASE_TABLET | Freq: Once | ORAL | Status: AC
  Administered 2019-11-08: 40 meq via ORAL
  Filled 2019-11-08: qty 2

## 2019-11-08 NOTE — Discharge Summary (Signed)
Discharge Summary  [name] [id number] DOB: Dec 30, 1960  PCP: Lucille Passy, MD  Admit date: 11/07/2019 Discharge date: 11/08/2019  Time spent: 30 mins  Recommendations for Outpatient Follow-up:  1. PCP in 1 week 2. Cardiology in 4 weeks  Discharge Diagnoses:  Active Hospital Problems   Diagnosis Date Noted   Syncope 11/07/2019   Hypokalemia    Pain of upper abdomen    Bradycardia 11/07/2019   Monoclonal B-cell lymphocytosis 09/20/2019    Resolved Hospital Problems  No resolved problems to display.    Discharge Condition: Stable  Diet recommendation: As tolerated  Vitals:   11/07/19 1932 11/08/19 0607  BP: 98/68 105/62  Pulse: 83 70  Resp: 18 18  Temp: 99.8 F (37.7 C) 99.4 F (37.4 C)  SpO2: 97% 93%    History of present illness:  [name] is a 58 y.o. female with medical history significant of IBS; endometriosis; monoclonal B cell lymphocytosis; and depression/anxiety presenting with syncope.  She got up overnight and apparently passed out into the floor. She was sweating profusely, felt nauseated.  He got her up and sat her back on the toilet and her head lolled back and her eyes rolled back and she lost consciousness again for maybe 10 seconds.  Since then, she has not had good balance and feels very fatigued.  No h/o similar.  She did have diarrhea when she sat back on the toilet.  They stayed up late, until midnight late night. She has a h/o bradycardia in the past.  Her BP was very low at the house, 70/40, 83/48.  HR was in the 40s at that time.  She was given 1L IVF and she remained orthostatic. She did have RLQ abdominal pain. In the ED, noted to be hypotensive, cardiology saw and recommends fluids.  Abd CT negative.    Today, pt reported feeling much better, walked around with PT, no orthostatic hypotension noted after 3 minutes.  Denied any chest pain, shortness of breath, worsening abdominal pain, nausea/vomiting, fever/chills,  cough/runny nose, dizziness.  Cardiology recommended a 30-day event monitor with subsequent follow-up.  Follow-up with PCP as well.  Advised patient to stay hydrated and possibly reduce caffeine intake for now.  PCP to reevaluate and decide if patient needs midodrine.  Hospital Course:  Principal Problem:   Syncope Active Problems:   Monoclonal B-cell lymphocytosis   Bradycardia   Hypokalemia   Pain of upper abdomen   Syncope Etiology appears most likely micturitional/vasovagal/orthostatic by history Orthostatic hypotension improved, status post IV fluids Echo showed EF of 60 to [id number], grade 1 diastolic dysfunction TSH WNL Cardiology consulted, recommend 30-day event monitor, with subsequent follow-up  Bradycardia Patient with bradycardia by EMS into the 40s and reported h/o this issue in the past Echo as above Follow-up with cardiology in 4 weeks, recommend 30-day event monitor  Monoclonal lymphocytosis Undergoing observation by oncology  Abdominal pain with diarrhea Unknown etiology, history of IBS, diverticulosis Currently no pain, no diarrhea/fever/nausea/vomiting CT abdomen unremarkable Scheduled to follow-up with GI next month        Malnutrition Type:      Malnutrition Characteristics:      Nutrition Interventions:      Estimated body mass index is 25.16 kg/m as calculated from the following:   Height as of this encounter: 5\' 11"  (1.803 m).   Weight as of this encounter: 81.8 kg.    Procedures:  None  Consultations:  Cardiology  Discharge Exam: BP 105/62 (BP Location:  Right Arm)    Pulse 70    Temp 99.4 F (37.4 C) (Oral)    Resp 18    Ht 5\' 11"  (1.803 m)    Wt 81.8 kg Comment: scale C   SpO2 93%    BMI 25.16 kg/m   General: NAD Cardiovascular: S1, S2 present Respiratory: CTAB  Discharge Instructions You were cared for by a hospitalist during your hospital stay. If you have any questions about your discharge medications or the care  you received while you were in the hospital after you are discharged, you can call the unit and asked to speak with the hospitalist on call if the hospitalist that took care of you is not available. Once you are discharged, your primary care physician will handle any further medical issues. Please note that NO REFILLS for any discharge medications will be authorized once you are discharged, as it is imperative that you return to your primary care physician (or establish a relationship with a primary care physician if you do not have one) for your aftercare needs so that they can reassess your need for medications and monitor your lab values.  Discharge Instructions    Diet - low sodium heart healthy   Complete by: As directed    Increase activity slowly   Complete by: As directed      Allergies as of 11/08/2019      Reactions   Chlordiazepoxide-clidinium    "loopy"   Codeine    REACTION: nausea,vomitting and dizziness   Penicillins    REACTION: hives ---taken as a child      Medication List    TAKE these medications   acetaminophen 500 MG tablet Commonly known as: TYLENOL Take 1,000 mg by mouth every 6 (six) hours as needed for mild pain.   Azelastine HCl 137 MCG/SPRAY Soln Place 1 spray into both nostrils daily.   b complex vitamins capsule Take 1 capsule by mouth daily.   Caltrate 600+D 600-800 MG-UNIT Tabs Generic drug: Calcium Carb-Cholecalciferol Take 1 tablet by mouth 2 (two) times daily.   cycloSPORINE 0.05 % ophthalmic emulsion Commonly known as: RESTASIS Place 1 drop into both eyes 2 (two) times daily.   dexlansoprazole 60 MG capsule Commonly known as: DEXILANT Take 1 capsule (60 mg total) by mouth daily. Pharmacy-please d/c rx for omeprazole or Dexilant 30 mg   EpiPen 2-Pak 0.3 mg/0.3 mL Soaj injection Generic drug: EPINEPHrine   FLUoxetine 20 MG capsule Commonly known as: PROZAC TAKE 1 CAPSULE BY MOUTH EVERY DAY What changed: how much to take     fluticasone 50 MCG/ACT nasal spray Commonly known as: FLONASE instill 2 sprays into each nostril once daily What changed: See the new instructions.   levocetirizine 5 MG tablet Commonly known as: XYZAL Take 5 mg by mouth every evening.   Linzess 145 MCG Caps capsule Generic drug: linaclotide TAKE 1 CAPSULE (145 MCG TOTAL) BY MOUTH DAILY BEFORE BREAKFAST. What changed: See the new instructions.   magnesium oxide 400 MG tablet Commonly known as: MAG-OX Take 400 mg by mouth daily.   montelukast 10 MG tablet Commonly known as: SINGULAIR Take 10 mg by mouth at bedtime.   MULTIVITAMINS PO Take 1 tablet by mouth daily.   polyethylene glycol 17 g packet Commonly known as: MIRALAX / GLYCOLAX Take 17 g by mouth daily.   valACYclovir 500 MG tablet Commonly known as: VALTREX Take 1 tablet (500 mg total) by mouth daily.   VITA-C PO Take by mouth.  Allergies  Allergen Reactions   Chlordiazepoxide-Clidinium     "loopy"   Codeine     REACTION: nausea,vomitting and dizziness   Penicillins     REACTION: hives ---taken as a child   Follow-up Information    Lucille Passy, MD. Schedule an appointment as soon as possible for a visit in 1 week(s).   Specialty: Family Medicine Contact information: [place] [address] [phone]            The results of significant diagnostics from this hospitalization (including imaging, microbiology, ancillary and laboratory) are listed below for reference.    Significant Diagnostic Studies: Dg Chest 1 View  Result Date: 11/07/2019 CLINICAL DATA:  Syncope x2 today EXAM: CHEST  1 VIEW COMPARISON:  Radiograph 02/28/2011 FINDINGS: Chronically coarsened interstitial changes appear somewhat more pronounced on today's exam though this may be related to diminished lung volumes compared to the hyperventilation on comparison studies. No consolidation, pneumothorax or effusion. Postsurgical changes related to  prior CABG including intact and aligned sternotomy wires and multiple surgical clips projecting over the mediastinum. The aorta is calcified. The remaining cardiomediastinal contours are unremarkable. No acute osseous or soft tissue abnormality. Degenerative changes are present in the imaged spine and shoulders. IMPRESSION: 1. Chronically coarsened interstitial changes appear somewhat more pronounced on today's exam though this may be related to diminished lung volumes from comparison studies. 2. No other acute cardiopulmonary abnormality. 3. Prior CABG. 4.  Aortic Atherosclerosis (ICD10-I70.0). Electronically Signed   By: Lovena Le M.D.   On: 11/07/2019 06:27   Ct Abdomen Pelvis W Contrast  Result Date: 11/07/2019 CLINICAL DATA:  Right lower quadrant pain, nausea, syncope EXAM: CT ABDOMEN AND PELVIS WITH CONTRAST TECHNIQUE: Multidetector CT imaging of the abdomen and pelvis was performed using the standard protocol following bolus administration of intravenous contrast. CONTRAST:  1103mL OMNIPAQUE IOHEXOL 300 MG/ML  SOLN COMPARISON:  05/04/2006 FINDINGS: Lower chest: Mild dependent atelectasis in the bilateral lower lobes. Hepatobiliary: Liver is within normal limits. Gallbladder is unremarkable. No intrahepatic or extrahepatic ductal dilatation. Pancreas: Within normal limits. Spleen: Within normal limits Adrenals/Urinary Tract: Adrenal glands are within normal limits. Kidneys are within limits. No hydronephrosis. Bladder is within normal limits. Stomach/Bowel: Stomach is within normal limits. No evidence of bowel obstruction. Normal appendix (coronal image 44). Mild sigmoid diverticulosis, without evidence of diverticulitis. Vascular/Lymphatic: No evidence of abdominal aortic aneurysm. No suspicious abdominopelvic lymphadenopathy. Reproductive: Status post hysterectomy. Left ovary is within normal limits. No right adnexal mass. Other: No abdominopelvic ascites. Musculoskeletal: Mild degenerative changes at  L5-S1. IMPRESSION: Negative CT abdomen/pelvis. Normal appendix. No CT findings to account for the patient's right lower quadrant abdominal pain. Electronically Signed   By: Julian Hy M.D.   On: 11/07/2019 09:32    Microbiology: Recent Results (from the past 240 hour(s))  SARS CORONAVIRUS 2 (TAT 6-24 HRS) Nasopharyngeal Nasopharyngeal Swab     Status: None   Collection Time: 11/07/19 10:10 AM   Specimen: Nasopharyngeal Swab  Result Value Ref Range Status   SARS Coronavirus 2 NEGATIVE NEGATIVE Final    Comment: (NOTE) SARS-CoV-2 target nucleic acids are NOT DETECTED. The SARS-CoV-2 RNA is generally detectable in upper and lower respiratory specimens during the acute phase of infection. Negative results do not preclude SARS-CoV-2 infection, do not rule out co-infections with other pathogens, and should not be used as the sole basis for treatment or other patient management decisions. Negative results must be combined with clinical observations, patient history, and epidemiological information.  The expected result is Negative. Fact Sheet for Patients: SugarRoll.be Fact Sheet for Healthcare Providers: https://www.woods-mathews.com/ This test is not yet approved or cleared by the [place] FDA and  has been authorized for detection and/or diagnosis of SARS-CoV-2 by FDA under an Emergency Use Authorization (EUA). This EUA will remain  in effect (meaning this test can be used) for the duration of the COVID-19 declaration under Section 56 4(b)(1) of the Act, 21 U.S.C. section 360bbb-3(b)(1), unless the authorization is terminated or revoked sooner. Performed at [facility], [place] [address], [place], [place] [address]      Labs: Basic Metabolic Panel: Recent Labs  Lab 11/07/19 0542 11/08/19 0354  NA 143 139  K 3.7 3.4*  CL 109 104  CO2 25 26  GLUCOSE 113* 121*  BUN 14 9  CREATININE 0.74 0.65  CALCIUM 8.7* 8.2*  MG 1.9   --    Liver Function Tests: Recent Labs  Lab 11/07/19 0542  AST 40  ALT 33  ALKPHOS 52  BILITOT 0.9  PROT 5.8*  ALBUMIN 3.4*   No results for input(s): LIPASE, AMYLASE in the last 168 hours. No results for input(s): AMMONIA in the last 168 hours. CBC: Recent Labs  Lab 11/07/19 0542 11/08/19 0354  WBC 15.0* 9.0  NEUTROABS 10.9*  --   HGB 14.4 13.0  HCT 44.0 38.7  MCV 96.5 93.3  PLT 254 229   Cardiac Enzymes: No results for input(s): CKTOTAL, CKMB, CKMBINDEX, TROPONINI in the last 168 hours. BNP: BNP (last 3 results) No results for input(s): BNP in the last 8760 hours.  ProBNP (last 3 results) No results for input(s): PROBNP in the last 8760 hours.  CBG: No results for input(s): GLUCAP in the last 168 hours.     Signed:  Alma Friendly, MD [facility] 11/08/2019, 11:32 AM

## 2019-11-08 NOTE — Progress Notes (Addendum)
Progress Note  Patient Name: [name] Date of Encounter: 11/08/2019  Primary Cardiologist: No primary care provider on file.   Subjective   Denies any dizziness, CP, SOB or palpitatoins  Inpatient Medications    Scheduled Meds: . cycloSPORINE  1 drop Both Eyes BID  . enoxaparin (LOVENOX) injection  40 mg Subcutaneous Q24H  . FLUoxetine  20 mg Oral Daily  . fluticasone  2 spray Each Nare Daily  . linaclotide  145 mcg Oral QAC breakfast  . loratadine  10 mg Oral QPM  . montelukast  10 mg Oral QHS  . pantoprazole  40 mg Oral Daily  . polyethylene glycol  17 g Oral Daily  . sodium chloride flush  3 mL Intravenous Q12H  . valACYclovir  500 mg Oral Daily   Continuous Infusions: . lactated ringers 75 mL/hr at 11/08/19 0320   PRN Meds: acetaminophen **OR** acetaminophen, ondansetron **OR** ondansetron (ZOFRAN) IV   Vital Signs    Vitals:   11/07/19 1846 11/07/19 1932 11/08/19 0325 11/08/19 0607  BP: (!) 103/57 98/68  105/62  Pulse: 77 83  70  Resp:  18  18  Temp:  99.8 F (37.7 C)  99.4 F (37.4 C)  TempSrc:  Oral  Oral  SpO2:  97%  93%  Weight:   81.8 kg   Height:        Intake/Output Summary (Last 24 hours) at 11/08/2019 0753 Last data filed at 11/08/2019 0745 Gross per 24 hour  Intake 3103.3 ml  Output 1801 ml  Net 1302.3 ml   Filed Weights   11/07/19 0534 11/07/19 1309 11/08/19 0325  Weight: 79.4 kg 79.2 kg 81.8 kg    Telemetry    NSR - Personally Reviewed  ECG    No new EKG to review - Personally Reviewed  Physical Exam   GEN: No acute distress.   Neck: No JVD Cardiac: RRR, no murmurs, rubs, or gallops.  Respiratory: Clear to auscultation bilaterally. GI: Soft, nontender, non-distended  MS: No edema; No deformity. Neuro:  Nonfocal  Psych: Normal affect   Labs    Chemistry Recent Labs  Lab 11/07/19 0542 11/08/19 0354  NA 143 139  K 3.7 3.4*  CL 109 104  CO2 25 26  GLUCOSE 113* 121*  BUN 14 9  CREATININE 0.74 0.65   CALCIUM 8.7* 8.2*  PROT 5.8*  --   ALBUMIN 3.4*  --   AST 40  --   ALT 33  --   ALKPHOS 52  --   BILITOT 0.9  --   GFRNONAA >60 >60  GFRAA >60 >60  ANIONGAP 9 9     Hematology Recent Labs  Lab 11/07/19 0542 11/08/19 0354  WBC 15.0* 9.0  RBC 4.56 4.15  HGB 14.4 13.0  HCT 44.0 38.7  MCV 96.5 93.3  MCH 31.6 31.3  MCHC 32.7 33.6  RDW 12.7 12.8  PLT 254 229    Cardiac EnzymesNo results for input(s): TROPONINI in the last 168 hours. No results for input(s): TROPIPOC in the last 168 hours.   BNPNo results for input(s): BNP, PROBNP in the last 168 hours.   DDimer No results for input(s): DDIMER in the last 168 hours.   Radiology    Dg Chest 1 View  Result Date: 11/07/2019 CLINICAL DATA:  Syncope x2 today EXAM: CHEST  1 VIEW COMPARISON:  Radiograph 02/28/2011 FINDINGS: Chronically coarsened interstitial changes appear somewhat more pronounced on today's exam though this may be related to diminished lung  volumes compared to the hyperventilation on comparison studies. No consolidation, pneumothorax or effusion. Postsurgical changes related to prior CABG including intact and aligned sternotomy wires and multiple surgical clips projecting over the mediastinum. The aorta is calcified. The remaining cardiomediastinal contours are unremarkable. No acute osseous or soft tissue abnormality. Degenerative changes are present in the imaged spine and shoulders. IMPRESSION: 1. Chronically coarsened interstitial changes appear somewhat more pronounced on today's exam though this may be related to diminished lung volumes from comparison studies. 2. No other acute cardiopulmonary abnormality. 3. Prior CABG. 4.  Aortic Atherosclerosis (ICD10-I70.0). Electronically Signed   By: Lovena Le M.D.   On: 11/07/2019 06:27   Ct Abdomen Pelvis W Contrast  Result Date: 11/07/2019 CLINICAL DATA:  Right lower quadrant pain, nausea, syncope EXAM: CT ABDOMEN AND PELVIS WITH CONTRAST TECHNIQUE: Multidetector CT  imaging of the abdomen and pelvis was performed using the standard protocol following bolus administration of intravenous contrast. CONTRAST:  137mL OMNIPAQUE IOHEXOL 300 MG/ML  SOLN COMPARISON:  05/04/2006 FINDINGS: Lower chest: Mild dependent atelectasis in the bilateral lower lobes. Hepatobiliary: Liver is within normal limits. Gallbladder is unremarkable. No intrahepatic or extrahepatic ductal dilatation. Pancreas: Within normal limits. Spleen: Within normal limits Adrenals/Urinary Tract: Adrenal glands are within normal limits. Kidneys are within limits. No hydronephrosis. Bladder is within normal limits. Stomach/Bowel: Stomach is within normal limits. No evidence of bowel obstruction. Normal appendix (coronal image 44). Mild sigmoid diverticulosis, without evidence of diverticulitis. Vascular/Lymphatic: No evidence of abdominal aortic aneurysm. No suspicious abdominopelvic lymphadenopathy. Reproductive: Status post hysterectomy. Left ovary is within normal limits. No right adnexal mass. Other: No abdominopelvic ascites. Musculoskeletal: Mild degenerative changes at L5-S1. IMPRESSION: Negative CT abdomen/pelvis. Normal appendix. No CT findings to account for the patient's right lower quadrant abdominal pain. Electronically Signed   By: Julian Hy M.D.   On: 11/07/2019 09:32    Cardiac Studies   2D echo 11/07/2019 IMPRESSIONS   1. Left ventricular ejection fraction, by visual estimation, is 60 to 65%. The left ventricle has normal function. There is mildly increased left ventricular hypertrophy.  2. Left ventricular diastolic parameters are consistent with Grade I diastolic dysfunction (impaired relaxation).  3. Global right ventricle has normal systolic function.The right ventricular size is mildly enlarged. No increase in right ventricular wall thickness.  4. Left atrial size was moderately dilated.  5. Right atrial size was normal.  6. The mitral valve is normal in structure. Trace mitral  valve regurgitation. No evidence of mitral stenosis.  7. The tricuspid valve is normal in structure. Tricuspid valve regurgitation is not demonstrated.  8. The aortic valve is tricuspid. Aortic valve regurgitation is not visualized. No evidence of aortic valve sclerosis or stenosis.  9. The pulmonic valve was normal in structure. Pulmonic valve regurgitation is trivial. 10. TR signal is inadequate for assessing pulmonary artery systolic pressure. 11. The inferior vena cava is dilated in size with >50% respiratory variability, suggesting right atrial pressure of 8 mmHg. Patient Profile     58 y.o. female with a hx of anxiety/depression who is being seen for the evaluation of syncope at the request of Dr Dayna Barker.  Assessment & Plan    1. Syncope - history would suggest vasovagal syncope.  -Had some intermittent bradycardia to high 40s in the ER, no heart block.  -in setting of ongoing nausea and some abdominal pain, suspect increased vagal tone the etiology of syncope.  -no tachy or bradyarrhythmias on tele -orthostatics normal although BP at baseline runs on the  soft side -could consider knee high compression hose and low dose Midodrin -2D echo with normal LVF, moderate LAE and no valvular heart disesae -encouraged to liberalize Na and drink at least 8 8oz glasses of fluid daily and limit caffeine intacke -no further inpt workup.  Will arrange outpt event mointor  2. Abdominal pain - ongoing workup per primary team, ER has ordered a CT scan.   3.  Hypokalemia -replete K+  [facility] will sign off.   Medication Recommendations:  No cardiac meds recommended Other recommendations (labs, testing, etc):  Event monitor - we well arrange Follow up as an outpatient:  followup in 4 weeks outpt in our office  For questions or updates, please contact [facility] Please consult [email or web address] for contact info under Cardiology/STEMI.      Signed, Fransico Him, MD  11/08/2019, 7:53 AM

## 2019-11-08 NOTE — Progress Notes (Signed)
Ordered outpatient 30 day event monitor for further evaluation of syncope at request of Dr. Radford Pax.

## 2019-11-08 NOTE — Evaluation (Signed)
Physical Therapy Evaluation Patient Details Name: [name] MRN: [id number] DOB: 1961-08-30 Today's Date: 11/08/2019   History of Present Illness  [name] is a 58 y.o. female with medical history significant of IBS; endometriosis; monoclonal B cell lymphocytosis; and depression/anxiety presenting with syncope.  Clinical Impression  Patient presents with mild generalized weakness following syncopal episodes at home.  Some lower BP's initially standing but asymptomatic and improved after standing 3 minutes.  Feel she is stable for home with spouse supervision and no follow up PT needs.  Will sign off.   Orthostatic VS for the past 24 hrs (Last 3 readings):  BP- Lying Pulse- Lying BP- Sitting Pulse- Sitting BP- Standing at 0 minutes Pulse- Standing at 0 minutes BP- Standing at 3 minutes Pulse- Standing at 3 minutes  11/08/19 0931 98/55 67 106/58 64 (!) 84/54 70 106/62 73  11/08/19 0607 105/62 70 110/73 68 104/66 74 - -    Follow Up Recommendations No PT follow up    Equipment Recommendations  None recommended by PT    Recommendations for Other Services       Precautions / Restrictions Precautions Precautions: Other (comment) Precaution Comments: rising slowly due to recent syncope and hypotension      Mobility  Bed Mobility Overal bed mobility: Independent                Transfers Overall transfer level: Independent Equipment used: None                Ambulation/Gait Ambulation/Gait assistance: Independent Gait Distance (Feet): 200 Feet Assistive device: None Gait Pattern/deviations: Step-through pattern;Decreased stride length     General Gait Details: slower pace than normal  Stairs Stairs: Yes Stairs assistance: Min guard;Supervision Stair Management: One rail Left;Forwards;Alternating pattern Number of Stairs: 4 General stair comments: assist for safety  Wheelchair Mobility    Modified Rankin (Stroke Patients Only)        Balance Overall balance assessment: No apparent balance deficits (not formally assessed)                                           Pertinent Vitals/Pain Pain Assessment: No/denies pain    Home Living Family/patient expects to be discharged to:: Private residence Living Arrangements: Spouse/significant other Available Help at Discharge: Family Type of Home: House Home Access: Stairs to enter Entrance Stairs-Rails: Right Entrance Stairs-Number of Steps: 4 Home Layout: One level Home Equipment: Shower seat - built in      Prior Function Level of Independence: Independent         Comments: works and works out regularly     Journalist, newspaper        Extremity/Trunk Assessment   Upper Extremity Assessment Upper Extremity Assessment: Overall WFL for tasks assessed    Lower Extremity Assessment Lower Extremity Assessment: Overall WFL for tasks assessed       Communication   Communication: No difficulties  Cognition Arousal/Alertness: Awake/alert Behavior During Therapy: WFL for tasks assessed/performed Overall Cognitive Status: Within Functional Limits for tasks assessed                                        General Comments General comments (skin integrity, edema, etc.): Educated on slowly rising, seated to shower initially with spouse nar and sitting  back down if feeling lightheaded and to avoid straining on toilet.    Exercises     Assessment/Plan    PT Assessment Patent does not need any further PT services  PT Problem List         PT Treatment Interventions      PT Goals (Current goals can be found in the Care Plan section)  Acute Rehab PT Goals Patient Stated Goal: to go home PT Goal Formulation: All assessment and education complete, DC therapy    Frequency     Barriers to discharge        Co-evaluation               AM-PAC PT "6 Clicks" Mobility  Outcome Measure Help needed turning from your back  to your side while in a flat bed without using bedrails?: None Help needed moving from lying on your back to sitting on the side of a flat bed without using bedrails?: None Help needed moving to and from a bed to a chair (including a wheelchair)?: None Help needed standing up from a chair using your arms (e.g., wheelchair or bedside chair)?: None Help needed to walk in hospital room?: None Help needed climbing 3-5 steps with a railing? : A Little 6 Click Score: 23    End of Session   Activity Tolerance: Patient tolerated treatment well Patient left: in bed;with call bell/phone within reach;Other (comment)(MD in room)   PT Visit Diagnosis: Muscle weakness (generalized) (M62.81)    Time: [id number] PT Time Calculation (min) (ACUTE ONLY): 25 min   Charges:   PT Evaluation $PT Eval Low Complexity: 1 Low PT Treatments $Self Care/Home Management: [place] [phone] 11/08/2019   Reginia Naas 11/08/2019, 10:35 AM

## 2019-11-08 NOTE — Progress Notes (Signed)
Call placed to [facility] to notify of telemetry monitoring d/c.   

## 2019-11-08 NOTE — Progress Notes (Signed)
DC paperwork reviewed with patient, pt's ride is en route to pick her up.

## 2019-11-11 DIAGNOSIS — I959 Hypotension, unspecified: Secondary | ICD-10-CM | POA: Insufficient documentation

## 2019-11-11 DIAGNOSIS — Z09 Encounter for follow-up examination after completed treatment for conditions other than malignant neoplasm: Secondary | ICD-10-CM | POA: Insufficient documentation

## 2019-11-11 LAB — FISH,CLL PROGNOSTIC PANEL

## 2019-11-11 LAB — LYMPHOMA-MANTLE CELL

## 2019-11-11 LAB — GENARRAY MOLECULAR KARYOTYPING FOR CLL

## 2019-11-11 NOTE — Assessment & Plan Note (Addendum)
Patient with bradycardia by EMS into the 40s and reported h/o this issue in the past, has had no further episodes of bradycardia- pulse running 70s -80s.  Follow-up with cardiology in 4 weeks, recommend 30-day event monitor- sees Dr. Ellyn Hack.  Referral for her to see him.  She will call him today.  Orders Placed This Encounter  Procedures  . Ambulatory referral to Cardiology

## 2019-11-11 NOTE — Assessment & Plan Note (Addendum)
BP Readings from Last 3 Encounters:  11/08/19 (!) 99/52  10/25/19 103/63  09/24/19 (!) 124/56   Resolved.  Will be seeing cardiology.

## 2019-11-11 NOTE — Progress Notes (Signed)
Virtual Visit via Video   Due to the COVID-19 pandemic, this visit was completed with telemedicine (audio/video) technology to reduce patient and provider exposure as well as to preserve personal protective equipment.   I connected with [name] by a video enabled telemedicine application and verified that I am speaking with the correct person using two identifiers. Location patient: Home Location provider:  [facility], Office Persons participating in the virtual visit: Marlinda Mike, MD   I discussed the limitations of evaluation and management by telemedicine and the availability of in person appointments. The patient expressed understanding and agreed to proceed.  Care Team   Patient Care Team: Lucille Passy, MD as PCP - General (Family Medicine) Pyrtle, Lajuan Lines, MD as Consulting Physician (Gastroenterology) Mosetta Anis, MD as Referring Physician (Allergy) Leonie Man, MD as Consulting Physician (Cardiology)  Subjective:   HPI:    Phone [phone]   Subjective:  [name] is a 58 y.o. year old very pleasant female patient who presents for hospital follow up for *syncope and abdominal pain. Patient was hospitalized from  11/26- 11/08/19.  Notes and studies reviewed.   Medical complexity high.    Presented to the ED on 11/07/19 after she was nauseated and felt diaphoretic.    She got up overnight and apparently passed out into the floor. [place] husband  got her up and sat her back on the toilet and her head lolled back and her eyes rolled back and she lost consciousness again for maybe 10 seconds. Upon arrival to ED, she stated that she had not had good balance and felt very fatigued- never had anything like this in the past.  She did have diarrhea when she sat back on the toilet.  She has a h/o bradycardia in the past. Her BP was very low at the house, 70/40, 83/48. HR was in the 40s at that time. She was given 1L IVF and she remained  orthostatic. She did have RLQ abdominal pain. In the ED, noted to be hypotensive, cardiology saw patient and recommended fluids. Abd CT negative.    CLINICAL DATA:  Right lower quadrant pain, nausea, syncope  EXAM: CT ABDOMEN AND PELVIS WITH CONTRAST  TECHNIQUE: Multidetector CT imaging of the abdomen and pelvis was performed using the standard protocol following bolus administration of intravenous contrast.  CONTRAST:  173m OMNIPAQUE IOHEXOL 300 MG/ML  SOLN  COMPARISON:  05/04/2006  FINDINGS: Lower chest: Mild dependent atelectasis in the bilateral lower lobes.  Hepatobiliary: Liver is within normal limits.  Gallbladder is unremarkable. No intrahepatic or extrahepatic ductal dilatation.  Pancreas: Within normal limits.  Spleen: Within normal limits  Adrenals/Urinary Tract: Adrenal glands are within normal limits.  Kidneys are within limits. No hydronephrosis.  Bladder is within normal limits.  Stomach/Bowel: Stomach is within normal limits.  No evidence of bowel obstruction.  Normal appendix (coronal image 44).  Mild sigmoid diverticulosis, without evidence of diverticulitis.  Vascular/Lymphatic: No evidence of abdominal aortic aneurysm.  No suspicious abdominopelvic lymphadenopathy.  Reproductive: Status post hysterectomy.  Left ovary is within normal limits.  No right adnexal mass.  Other: No abdominopelvic ascites.  Musculoskeletal: Mild degenerative changes at L5-S1.  IMPRESSION: Negative CT abdomen/pelvis.  Normal appendix.  No CT findings to account for the patient's right lower quadrant abdominal pain.  Has not any further presyncopal or syncopal episodes.  Abdominal pain has improved- has a bruise where it hurt so she thinks when she passed out, that's  where she hit the side of the bath.  Has been checking BP at home- this morning she checked while we were on video and it was 122/71 which is around what it  has been running.    Recent Results (from the past 2160 hour(s))  CBC with Differential/Platelet     Status: Abnormal   Collection Time: 08/22/19  8:08 AM  Result Value Ref Range   WBC 8.7 4.0 - 10.5 K/uL   RBC 4.43 3.87 - 5.11 Mil/uL   Hemoglobin 13.8 12.0 - 15.0 g/dL   HCT 42.0 36.0 - 46.0 %   MCV 94.9 78.0 - 100.0 fl   MCHC 32.7 30.0 - 36.0 g/dL   RDW 13.5 11.5 - 15.5 %   Platelets 303.0 150.0 - 400.0 K/uL   Neutrophils Relative % 34.6 (L) 43.0 - 77.0 %   Lymphocytes Relative 52.9 (H) 12.0 - 46.0 %   Monocytes Relative 9.4 3.0 - 12.0 %   Eosinophils Relative 2.8 0.0 - 5.0 %   Basophils Relative 0.3 0.0 - 3.0 %   Neutro Abs 3.0 1.4 - 7.7 K/uL   Lymphs Abs 4.6 (H) 0.7 - 4.0 K/uL   Monocytes Absolute 0.8 0.1 - 1.0 K/uL   Eosinophils Absolute 0.2 0.0 - 0.7 K/uL   Basophils Absolute 0.0 0.0 - 0.1 K/uL  CBC with Differential/Platelet     Status: Abnormal   Collection Time: 09/02/19  1:41 PM  Result Value Ref Range   WBC 9.6 4.0 - 10.5 K/uL   RBC 4.41 3.87 - 5.11 Mil/uL   Hemoglobin 13.9 12.0 - 15.0 g/dL   HCT 41.4 36.0 - 46.0 %   MCV 94.0 78.0 - 100.0 fl   MCHC 33.5 30.0 - 36.0 g/dL   RDW 13.3 11.5 - 15.5 %   Platelets 288.0 150.0 - 400.0 K/uL   Neutrophils Relative % 33.7 (L) 43.0 - 77.0 %   Lymphocytes Relative 59.4 Repeated and verified X2. (H) 12.0 - 46.0 %    Comment: Manual smear review agrees with instrument differential.   Monocytes Relative 6.1 3.0 - 12.0 %   Eosinophils Relative 1.6 0.0 - 5.0 %   Basophils Relative 0.6 0.0 - 3.0 %   Neutro Abs 3.2 1.4 - 7.7 K/uL   Lymphs Abs 5.6 (H) 0.7 - 4.0 K/uL   Monocytes Absolute 0.6 0.1 - 1.0 K/uL   Eosinophils Absolute 0.2 0.0 - 0.7 K/uL   Basophils Absolute 0.1 0.0 - 0.1 K/uL  Pathologist smear review     Status: None   Collection Time: 09/02/19  1:41 PM  Result Value Ref Range   Path Review      Comment: Absolute lymphocytosis. A few atypical lymphs. No immature cells are identified. However, if absolute  lymphocytosis persists, suggest immunophenotyping by flow cytometry for further evaluation, if clinically indicated. RBCs and platelets are unremarkable. Reviewed by Francis Gaines Mammarappallil, MD  (Electronic Signature on File)      09/03/19   Lactate Dehydrogenase (LDH)     Status: None   Collection Time: 09/02/19  1:41 PM  Result Value Ref Range   LDH 180 120 - 250 U/L  Hepatitis, Acute     Status: None   Collection Time: 09/02/19  1:41 PM  Result Value Ref Range   Hep A IgM NON-REACTIVE NON-REACTI   Hepatitis B Surface Ag NON-REACTIVE NON-REACTI   Hep B C IgM NON-REACTIVE NON-REACTI   Hepatitis C Ab NON-REACTIVE NON-REACTI   SIGNAL TO CUT-OFF 0.06 <1.00  Comment: . HCV antibody was non-reactive. There is no laboratory  evidence of HCV infection. . In most cases, no further action is required. However, if recent HCV exposure is suspected, a test for HCV RNA (test code [id number]) is suggested. . For additional information please refer to http://education.questdiagnostics.com/faq/FAQ22v1 (This link is being provided for informational/ educational purposes only.) . [name] For additional information, please refer to  http://education.questdiagnostics.com/faq/FAQ202  (This link is being provided for informational/ educational purposes only.) .   B12 and Folate Panel     Status: Abnormal   Collection Time: 09/02/19  1:41 PM  Result Value Ref Range   Vitamin B-12 1,018 (H) 211 - 911 pg/mL   Folate >24.7 >5.9 ng/mL  HIV antibody (with reflex)     Status: None   Collection Time: 09/02/19  1:41 PM  Result Value Ref Range   HIV 1&2 Ab, 4th Generation NON-REACTIVE NON-REACTI    Comment: HIV-1 antigen and HIV-1/HIV-2 antibodies were not detected. There is no laboratory evidence of HIV infection. [name] PLEASE NOTE: This information has been disclosed to you from records whose confidentiality may be protected by state law.  If your state requires such protection, then the state law  prohibits you from making any further disclosure of the information without the specific written consent of the person to whom it pertains, or as otherwise permitted by law. A general authorization for the release of medical or other information is NOT sufficient for this purpose. . For additional information please refer to http://education.questdiagnostics.com/faq/FAQ106 (This link is being provided for informational/ educational purposes only.) . [name] The performance of this assay has not been clinically validated in patients less than 30 years old. .   Ceruloplasmin     Status: None   Collection Time: 09/02/19  1:41 PM  Result Value Ref Range   Ceruloplasmin 31 18 - 53 mg/dL  Alpha-1-antitrypsin     Status: None   Collection Time: 09/02/19  1:41 PM  Result Value Ref Range   A-1 Antitrypsin, Ser 123 83 - 199 mg/dL  CBC with Differential ([facility] Only)     Status: Abnormal   Collection Time: 09/24/19  8:37 AM  Result Value Ref Range   WBC Count 10.5 4.0 - 10.5 K/uL   RBC 4.58 3.87 - 5.11 MIL/uL   Hemoglobin 14.1 12.0 - 15.0 g/dL   HCT 43.9 36.0 - 46.0 %   MCV 95.9 80.0 - 100.0 fL   MCH 30.8 26.0 - 34.0 pg   MCHC 32.1 30.0 - 36.0 g/dL   RDW 12.9 11.5 - 15.5 %   Platelet Count 286 150 - 400 K/uL   nRBC 0.0 0.0 - 0.2 %   Neutrophils Relative % 37 %   Neutro Abs 3.9 1.7 - 7.7 K/uL   Lymphocytes Relative 54 %   Lymphs Abs 5.7 (H) 0.7 - 4.0 K/uL   Monocytes Relative 6 %   Monocytes Absolute 0.7 0.1 - 1.0 K/uL   Eosinophils Relative 2 %   Eosinophils Absolute 0.2 0.0 - 0.5 K/uL   Basophils Relative 1 %   Basophils Absolute 0.1 0.0 - 0.1 K/uL   Immature Granulocytes 0 %   Abs Immature Granulocytes 0.01 0.00 - 0.07 K/uL    Comment: Performed at [facility] at [facility], [address] [place], [place] [address]  CMP ([place] only)     Status: Abnormal   Collection Time: 09/24/19  8:37 AM  Result Value Ref Range  Sodium 143 135 -  145 mmol/L   Potassium 4.3 3.5 - 5.1 mmol/L   Chloride 105 98 - 111 mmol/L   CO2 31 22 - 32 mmol/L   Glucose, Bld 100 (H) 70 - 99 mg/dL   BUN 17 6 - 20 mg/dL   Creatinine 0.88 0.44 - 1.00 mg/dL   Calcium 9.6 8.9 - 10.3 mg/dL   Total Protein 6.8 6.5 - 8.1 g/dL   Albumin 4.4 3.5 - 5.0 g/dL   AST 22 15 - 41 U/L   ALT 19 0 - 44 U/L   Alkaline Phosphatase 53 38 - 126 U/L   Total Bilirubin 0.8 0.3 - 1.2 mg/dL   GFR, Est Non Af Am >60 >60 mL/min   GFR, Est AFR Am >60 >60 mL/min   Anion gap 7 5 - 15    Comment: Performed at [facility] at [facility], [address] [place], [place] [address]  Save Smear [facility])     Status: None   Collection Time: 09/24/19  8:37 AM  Result Value Ref Range   Smear Review SMEAR STAINED AND AVAILABLE FOR REVIEW     Comment: Performed at [facility] at [facility] [address] [place], [place] [address]  HIV Antibody (routine testing w rflx)     Status: None   Collection Time: 09/24/19  8:37 AM  Result Value Ref Range   HIV Screen 4th Generation wRfx NON REACTIVE NON REACTIVE    Comment: Performed at [facility], [address], [place], [place] [address]  Hepatitis c antibody (reflex)     Status: None   Collection Time: 09/24/19  8:37 AM  Result Value Ref Range   HCV Ab 0.1 0.0 - 0.9 s/co ratio    Comment: (NOTE) Performed At: [facility] [place], [place] [id number] Rush Farmer MD [id number]   Hepatitis B core antibody, total     Status: None   Collection Time: 09/24/19  8:37 AM  Result Value Ref Range   Hep B Core Total Ab NON REACTIVE NON REACTIVE    Comment: Performed at [place] [address] [place], [place] [address]  Hepatitis B surface antibody,qualitative     Status: None   Collection Time: 09/24/19  8:37 AM  Result Value Ref Range   Hep B S Ab NON REACTIVE NON REACTIVE    Comment: (NOTE) Inconsistent with immunity, less than 10  mIU/mL. Performed at [facility], [place] [address], [place], [place] [address]   Hepatitis B surface antigen     Status: None   Collection Time: 09/24/19  8:37 AM  Result Value Ref Range   Hepatitis B Surface Ag NON REACTIVE NON REACTIVE    Comment: Performed at [place] [address] [place], [place] [address]  Flow Cytometry     Status: None   Collection Time: 09/24/19  8:37 AM  Result Value Ref Range   Flow Cytometry See Scanned report in [place]: Performed at [facility] at [facility], [address] [place], [place] [address]  HSV and VZV PCR Panel     Status: None   Collection Time: 09/24/19  8:37 AM  Result Value Ref Range   Varicella-Zoster, PCR REJ5     Comment: (NOTE) The specimen submitted does not meet the laboratory's criteria for  acceptability. Refer to [employer] of Services for specimen acceptability criteria.      Required: Swab in Universal Transport Medium      Received: Serum      Reva Elvis Coil was notified 09/25/2019. This test was developed and its performance characteristics determined by [facility].  It has not been cleared or approved by the Food and Drug Administration.  The FDA has determined that such clearance or approval is not necessary.    HSV 1 DNA NOT PERFORMED     Comment: Test not performed   HSV 2 DNA NOT PERFORMED     Comment: (NOTE) Test not performed Performed At: [facility] [place], [place] [id number] Rush Farmer MD WU:[id number]   HCV Comment:     Status: None   Collection Time: 09/24/19  8:37 AM  Result Value Ref Range   Comment: Comment     Comment: (NOTE) Non reactive HCV antibody screen is consistent with no HCV infection, unless recent infection is suspected or other evidence exists to indicate HCV infection. Performed At: [facility] [place], [place] [id number] Rush Farmer MD [id number]     Lactate dehydrogenase     Status: Abnormal   Collection Time: 09/24/19  8:40 AM  Result Value Ref Range   LDH 216 (H) 98 - 192 U/L    Comment: Performed at [facility], [place] [address], [place], [place] [address]  Surgical pathology     Status: None   Collection Time: 09/24/19  8:52 AM  Result Value Ref Range   SURGICAL PATHOLOGY      Surgical Pathology CASE: [id number] PATIENT: [name] Flow Pathology Report     Clinical history: Lymphocytosis     DIAGNOSIS:  -  Kappa restricted B-cell population with co-expression of CD5 comprises 56% of all lymphocytes -  See comment  COMMENT:  There is a small population of lymphocytes that express CD20, CD19, CD5 and CD200. Immunophenotypically these findings are the same as chronic lymphocytic leukemia (CLL). However, in the absence of lymphadenopathy, organomegaly, cytopenias, or disease related symptoms, the presence of less than 5000 monoclonal B-lymphocytes per cubic millimeter is defined as monoclonal B-lymphocytosis (MBL). CLL requiring treatment develops in patients with MBL at the rate of 1.1% per year.  Reference: 1.   [name], et al. The 2016 Revision of the [place] Organization classification of lymphoid neoplasms. Blood, 30 Apr 2015. Volume 127, Number 20. 2.   Rawstron AC, et al. Monoclonal B-cell lymphocytosis a nd chronic lymphocytic leukemia. [place] Med. 2008 Aug 7;359(6):575-83. 3.   Rawstron AC, Hillmen P. Clinical and diagnostic implications of monoclonal B-cell lymphocytosis. Best Pract Res Clin Haematol. 2010 Mar;23(1):61-9.    GATING AND PHENOTYPIC ANALYSIS:  Gated population: Flow cytometric immunophenotyping is performed using antibodies to the antigens listed in the table below. Electronic gates are placed around a cell cluster displaying light scatter properties corresponding to: lymphocytes  Abnormal Cells in gated population: 56%  Phenotype of  Abnormal Cells: CD5, CD19, CD20, CD200, Kappa                       Lymphoid Antigens       Myeloid Antigens Miscellaneous CD2  NEG  CD10 NEG  CD11b     ND   CD45 POS CD3  NEG  CD19 POS  CD11c     ND   HLA-Dr    ND CD4  NEG  CD20 POS  CD13 ND   CD34 NEG CD5  POS  CD22 ND   CD14 ND   CD38 NEG CD7  NEG  CD79b     ND   CD15 ND   CD138     ND CD8  NEG  CD103     ND   CD16 ND   TdT  ND CD25 ND   CD200     POS  CD33  ND   CD123     ND TCRab     ND   sKappa    POS  CD64 ND   CD41 ND TCRgd     NEG  sLambda   NEG  CD117     ND   CD61 ND CD56 NEG  cKappa    ND   MPO  ND   CD71 ND CD57 ND   cLambda   ND        [id number]     GROSS DESCRIPTION:  One lavender top tube submitted from [facility] for lymphoma testing.    Final Diagnosis performed by Thressa Sheller, MD.   Electronically signed 09/25/2019 Technical component performed at [facility], [place] [address], [place], [place] [address].  Professional component performed at [employer]. [facility], [place] [address], [place], [place] [address].   Novel Coronavirus, NAA ([facility])     Status: None   Collection Time: 10/02/19 12:00 AM   Specimen: Nasopharyngeal(NP) swabs in vial transport medium   NASOPHARYNGE  TESTING  Result Value Ref Range   SARS-CoV-2, NAA Not Detected Not Detected    Comment: This nucleic acid amplification test was developed and its performance characteristics determined by [employer]. Nucleic acid amplification tests include PCR and TMA. This test has not been FDA cleared or approved. This test has been authorized by FDA under an Emergency Use Authorization (EUA). This test is only authorized for the duration of time the declaration that circumstances exist justifying the authorization of the emergency use of in vitro diagnostic tests for detection of SARS-CoV-2 virus and/or diagnosis of COVID-19 infection under section 564(b)(1) of the Act, 21 U.S.C. 962IWL-7(L) (1), unless the  authorization is terminated or revoked sooner. When diagnostic testing is negative, the possibility of a false negative result should be considered in the context of a patient's recent exposures and the presence of clinical signs and symptoms consistent with COVID-19. An individual without symptoms of COVID-19 and who is not shedding SARS-CoV-2 virus would  expect to have a negative (not detected) result in this assay.   Lymphoma-Mantle Cell     Status: None   Collection Time: 10/25/19 11:16 AM  Result Value Ref Range   Lymphoma Mantle Cell See Scanned report in [place]: Performed at [facility] at [facility], [address], [place], [place] [address]  [place]     Status: None   Collection Time: 10/25/19 11:16 AM  Result Value Ref Range   Genarray Molecular Karyotyping (GP) See Scanned report in [place]: Performed at [facility] at [facility], [address] [place], [place] [address]  [place], CLL Prognostic Panel     Status: None   Collection Time: 10/25/19 11:16 AM  Result Value Ref Range   Fish, CLL See Scanned report in [place]: Performed at [facility] at [facility]  [address], [address], [place], [place] [address]  Lactate dehydrogenase     Status: None   Collection Time: 10/25/19 11:16 AM  Result Value Ref Range   LDH 172 98 - 192 U/L    Comment: Performed at [facility], [place] [address], [place], [place] [address]  Save Smear [facility])     Status: None   Collection Time: 10/25/19 11:16 AM  Result Value Ref Range   Smear Review SMEAR STAINED AND AVAILABLE FOR REVIEW     Comment: Performed at [facility] at [facility], [address] [place], [place] [address]  CMP ([place] only)     Status: Abnormal   Collection Time: 10/25/19 11:16 AM  Result  Value Ref Range   Sodium 142 135 - 145 mmol/L   Potassium 4.2 3.5 - 5.1 mmol/L   Chloride 106 98 - 111 mmol/L   CO2 30 22 - 32 mmol/L   Glucose, Bld 103 (H) 70 - 99 mg/dL   BUN 12 6 - 20 mg/dL   Creatinine 0.78 0.44 - 1.00 mg/dL   Calcium 9.0 8.9 - 10.3 mg/dL   Total Protein 6.3 (L) 6.5 - 8.1 g/dL   Albumin 4.5 3.5 - 5.0 g/dL   AST 20 15 - 41 U/L   ALT 19 0 - 44 U/L   Alkaline Phosphatase 63 38 - 126 U/L   Total Bilirubin 0.8 0.3 - 1.2 mg/dL   GFR, Est Non Af Am >60 >60 mL/min   GFR, Est AFR Am >60 >60 mL/min   Anion gap 6 5 - 15    Comment: Performed at [facility] at [facility], [address], [place], [place] [address]  CBC with Differential ([place] Only)     Status: Abnormal   Collection Time: 10/25/19 11:16 AM  Result Value Ref Range   WBC Count 9.5 4.0 - 10.5 K/uL   RBC 4.55 3.87 - 5.11 MIL/uL   Hemoglobin 14.2 12.0 - 15.0 g/dL   HCT 43.3 36.0 - 46.0 %   MCV 95.2 80.0 - 100.0 fL   MCH 31.2 26.0 - 34.0 pg   MCHC 32.8 30.0 - 36.0 g/dL   RDW 12.9 11.5 - 15.5 %   Platelet Count 305 150 - 400 K/uL   nRBC 0.0 0.0 - 0.2 %   Neutrophils Relative % 37 %   Neutro Abs 3.5 1.7 - 7.7 K/uL   Lymphocytes Relative 51 %   Lymphs Abs 4.9 (H) 0.7 - 4.0 K/uL   Monocytes Relative 9 %   Monocytes Absolute 0.8 0.1 - 1.0 K/uL   Eosinophils Relative 2 %   Eosinophils Absolute 0.2 0.0 - 0.5 K/uL   Basophils Relative 1 %   Basophils Absolute 0.1 0.0 - 0.1 K/uL   Immature Granulocytes 0 %   Abs Immature Granulocytes 0.01 0.00 - 0.07 K/uL    Comment: Performed at [facility] at [facility], [address] [place], [place] [address]  CBC WITH DIFFERENTIAL     Status: Abnormal   Collection Time: 11/07/19  5:42 AM  Result Value Ref Range   WBC 15.0 (H) 4.0 - 10.5 K/uL   RBC 4.56 3.87 - 5.11 MIL/uL   Hemoglobin 14.4 12.0 - 15.0 g/dL   HCT 44.0 36.0 - 46.0 %   MCV 96.5 80.0 - 100.0 fL   MCH 31.6 26.0 - 34.0 pg  MCHC 32.7  30.0 - 36.0 g/dL   RDW 12.7 11.5 - 15.5 %   Platelets 254 150 - 400 K/uL   nRBC 0.0 0.0 - 0.2 %   Neutrophils Relative % 73 %   Neutro Abs 10.9 (H) 1.7 - 7.7 K/uL   Lymphocytes Relative 19 %   Lymphs Abs 2.8 0.7 - 4.0 K/uL   Monocytes Relative 7 %   Monocytes Absolute 1.1 (H) 0.1 - 1.0 K/uL   Eosinophils Relative 1 %   Eosinophils Absolute 0.2 0.0 - 0.5 K/uL   Basophils Relative 0 %   Basophils Absolute 0.0 0.0 - 0.1 K/uL   Immature Granulocytes 0 %   Abs Immature Granulocytes 0.04 0.00 - 0.07 K/uL    Comment: Performed at [place] [address], [place], [place] [address]  Comprehensive metabolic panel     Status: Abnormal   Collection Time: 11/07/19  5:42 AM  Result Value Ref Range   Sodium 143 135 - 145 mmol/L   Potassium 3.7 3.5 - 5.1 mmol/L   Chloride 109 98 - 111 mmol/L   CO2 25 22 - 32 mmol/L   Glucose, Bld 113 (H) 70 - 99 mg/dL   BUN 14 6 - 20 mg/dL   Creatinine, Ser 0.74 0.44 - 1.00 mg/dL   Calcium 8.7 (L) 8.9 - 10.3 mg/dL   Total Protein 5.8 (L) 6.5 - 8.1 g/dL   Albumin 3.4 (L) 3.5 - 5.0 g/dL   AST 40 15 - 41 U/L   ALT 33 0 - 44 U/L   Alkaline Phosphatase 52 38 - 126 U/L   Total Bilirubin 0.9 0.3 - 1.2 mg/dL   GFR calc non Af Amer >60 >60 mL/min   GFR calc Af Amer >60 >60 mL/min   Anion gap 9 5 - 15    Comment: Performed at [place] [address], [place], [place] [address]  Magnesium     Status: None   Collection Time: 11/07/19  5:42 AM  Result Value Ref Range   Magnesium 1.9 1.7 - 2.4 mg/dL    Comment: Performed at [place] [address] [place], [place] [address]  I-Stat beta hCG blood, ED     Status: None   Collection Time: 11/07/19  5:56 AM  Result Value Ref Range   I-stat hCG, quantitative <5.0 <5 mIU/mL   Comment 3            Comment:   GEST. AGE      CONC.  (mIU/mL)   <=1 WEEK        5 - 50     2 WEEKS       50 - 500     3 WEEKS       100 - 10,000     4 WEEKS     1,000 - 30,000        FEMALE AND NON-PREGNANT  FEMALE:     LESS THAN 5 mIU/mL   Urinalysis, Routine w reflex microscopic     Status: Abnormal   Collection Time: 11/07/19  8:15 AM  Result Value Ref Range   Color, Urine YELLOW YELLOW   APPearance CLOUDY (A) CLEAR   Specific Gravity, Urine 1.016 1.005 - 1.030   pH 5.0 5.0 - 8.0   Glucose, UA NEGATIVE NEGATIVE mg/dL   Hgb urine dipstick NEGATIVE NEGATIVE   Bilirubin Urine NEGATIVE NEGATIVE   Ketones, ur NEGATIVE NEGATIVE mg/dL   Protein, ur NEGATIVE  NEGATIVE mg/dL   Nitrite NEGATIVE NEGATIVE   Leukocytes,Ua LARGE (A) NEGATIVE   RBC / HPF 6-10 0 - 5 RBC/hpf   WBC, UA 21-50 0 - 5 WBC/hpf   Bacteria, UA RARE (A) NONE SEEN   Squamous Epithelial / LPF 6-10 0 - 5   Mucus PRESENT    Non Squamous Epithelial 0-5 (A) NONE SEEN    Comment: Performed at [facility], [place] [address] [place], [place] [address]  TSH     Status: None   Collection Time: 11/07/19  9:28 AM  Result Value Ref Range   TSH 1.797 0.350 - 4.500 uIU/mL    Comment: Performed by a 3rd Generation assay with a functional sensitivity of <=0.01 uIU/mL. Performed at [facility], [place] [address], [place], [place] [address]   SARS CORONAVIRUS 2 (TAT 6-24 HRS) Nasopharyngeal Nasopharyngeal Swab     Status: None   Collection Time: 11/07/19 10:10 AM   Specimen: Nasopharyngeal Swab  Result Value Ref Range   SARS Coronavirus 2 NEGATIVE NEGATIVE    Comment: (NOTE) SARS-CoV-2 target nucleic acids are NOT DETECTED. The SARS-CoV-2 RNA is generally detectable in upper and lower respiratory specimens during the acute phase of infection. Negative results do not preclude SARS-CoV-2 infection, do not rule out co-infections with other pathogens, and should not be used as the sole basis for treatment or other patient management decisions. Negative results must be combined with clinical observations, patient history, and epidemiological information. The expected result is Negative. Fact Sheet for  Patients: SugarRoll.be Fact Sheet for Healthcare Providers: https://www.woods-mathews.com/ This test is not yet approved or cleared by the [place] FDA and  has been authorized for detection and/or diagnosis of SARS-CoV-2 by FDA under an Emergency Use Authorization (EUA). This EUA will remain  in effect (meaning this test can be used) for the duration of the COVID-19 declaration under Section 56 4(b)(1) of the Act, 21 U.S.C. section 360bbb-3(b)(1), unless the authorization is terminated or revoked sooner. Performed at [facility], [place] [address] [place], [place] [address]   TSH     Status: None   Collection Time: 11/07/19  2:50 PM  Result Value Ref Range   TSH 1.331 0.350 - 4.500 uIU/mL    Comment: Performed by a 3rd Generation assay with a functional sensitivity of <=0.01 uIU/mL. Performed at [facility], [place] [address], [place], [place] [address]   ECHOCARDIOGRAM COMPLETE     Status: None   Collection Time: 11/07/19  4:02 PM  Result Value Ref Range   Weight 2,792 oz   Height 71 in   BP 106/64 mmHg  Basic metabolic panel     Status: Abnormal   Collection Time: 11/08/19  3:54 AM  Result Value Ref Range   Sodium 139 135 - 145 mmol/L   Potassium 3.4 (L) 3.5 - 5.1 mmol/L   Chloride 104 98 - 111 mmol/L   CO2 26 22 - 32 mmol/L   Glucose, Bld 121 (H) 70 - 99 mg/dL   BUN 9 6 - 20 mg/dL   Creatinine, Ser 0.65 0.44 - 1.00 mg/dL   Calcium 8.2 (L) 8.9 - 10.3 mg/dL   GFR calc non Af Amer >60 >60 mL/min   GFR calc Af Amer >60 >60 mL/min   Anion gap 9 5 - 15    Comment: Performed at [facility], [place] [address], [place], [place] [address]  CBC     Status: None  Collection Time: 11/08/19  3:54 AM  Result Value Ref Range   WBC 9.0 4.0 - 10.5 K/uL   RBC 4.15 3.87 - 5.11 MIL/uL   Hemoglobin 13.0 12.0 - 15.0 g/dL   HCT 38.7 36.0 - 46.0 %   MCV 93.3 80.0 - 100.0 fL   MCH 31.3 26.0 - 34.0 pg   MCHC 33.6 30.0 - 36.0 g/dL    RDW 12.8 11.5 - 15.5 %   Platelets 229 150 - 400 K/uL   nRBC 0.0 0.0 - 0.2 %    Comment: Performed at [facility], [place] [address] [place], [place] [address]  Hemoglobin A1c     Status: None   Collection Time: 11/08/19  3:54 AM  Result Value Ref Range   Hgb A1c MFr Bld 5.6 4.8 - 5.6 %    Comment: (NOTE) Pre diabetes:          5.7%-6.4% Diabetes:              >6.4% Glycemic control for   <7.0% adults with diabetes    Mean Plasma Glucose 114.02 mg/dL    Comment: Performed at [place] [address] [place], [place] [address]  Lipid panel     Status: Abnormal   Collection Time: 11/08/19  3:54 AM  Result Value Ref Range   Cholesterol 123 0 - 200 mg/dL   Triglycerides 58 <150 mg/dL   HDL 34 (L) >40 mg/dL   Total CHOL/HDL Ratio 3.6 RATIO   VLDL 12 0 - 40 mg/dL   LDL Cholesterol 77 0 - 99 mg/dL    Comment:        Total Cholesterol/HDL:CHD Risk Coronary Heart Disease Risk Table                     Men   Women  1/2 Average Risk   3.4   3.3  Average Risk       5.0   4.4  2 X Average Risk   9.6   7.1  3 X Average Risk  23.4   11.0        Use the calculated Patient Ratio above and the CHD Risk Table to determine the patient's CHD Risk.        ATP III CLASSIFICATION (LDL):  <100     mg/dL   Optimal  100-129  mg/dL   Near or Above                    Optimal  130-159  mg/dL   Borderline  160-189  mg/dL   High  >190     mg/dL   Very High Performed at [facility] [address], [place], [place] [address]       See problem oriented charting as well  Review of Systems  Constitutional: Negative.  Negative for activity change, appetite change, chills, diaphoresis, fatigue, fever and unexpected weight change.  HENT: Negative.   Eyes: Negative.   Respiratory: Negative.  Negative for cough, shortness of breath and wheezing.   Cardiovascular: Negative.  Negative for chest pain, palpitations and leg swelling.  Gastrointestinal: Negative.  Negative for  abdominal pain, anal bleeding, blood in stool, constipation, diarrhea and nausea.  Genitourinary: Negative for difficulty urinating, dyspareunia, dysuria, enuresis, flank pain, frequency, genital sores, hematuria, menstrual problem and pelvic pain.  Musculoskeletal: Negative.   Skin: Negative.  Negative for pallor and rash.  Allergic/Immunologic: Negative.   Neurological: Negative.  Hematological: Negative.   Psychiatric/Behavioral: Negative.   All other systems reviewed and are negative.    Past Medical History-  Patient Active Problem List   Diagnosis Date Noted   Hospital discharge follow-up 11/11/2019   Hypotension 11/11/2019   Hypokalemia    Pain of upper abdomen    Syncope 11/07/2019   Bradycardia 11/07/2019   Upper respiratory infection 10/03/2019   Monoclonal B-cell lymphocytosis 09/20/2019   Neutropenia (HCC) 09/01/2019   Allergic rhinitis    OSA (obstructive sleep apnea) 02/20/2019   HSV (herpes simplex virus) anogenital infection 08/31/2018   Chronic constipation 05/31/2018   Eczema 01/09/2016   GERD (gastroesophageal reflux disease)-probable paroxsysmal relaxation LES 03/14/2012   Hair loss 06/29/2011   GERD 02/28/2011   VARICOSE VEINS, LOWER EXTREMITIES 01/08/2008   INTESTINAL VOLVULUS, LARGE BOWEL 01/08/2008   Anxiety 01/07/2008   IBS (irritable bowel syndrome) 01/07/2008   MIGRAINES, HX OF 01/07/2008   ATRIAL SEPTAL DEFECT - Status Post Repair 01/08/1988    Medications- reviewed and updated  A medical reconciliation was performed comparing current medicines to hospital discharge medications. Current Outpatient Medications  Medication Sig Dispense Refill   acetaminophen (TYLENOL) 500 MG tablet Take 1,000 mg by mouth every 6 (six) hours as needed for mild pain.     Ascorbic Acid (VITA-C PO) Take by mouth.     Azelastine HCl 137 MCG/SPRAY SOLN Place 1 spray into both nostrils daily.      b complex vitamins capsule Take 1 capsule  by mouth daily.     Calcium Carb-Cholecalciferol (CALTRATE 600+D) 600-800 MG-UNIT TABS Take 1 tablet by mouth 2 (two) times daily.     cycloSPORINE (RESTASIS) 0.05 % ophthalmic emulsion Place 1 drop into both eyes 2 (two) times daily.      dexlansoprazole (DEXILANT) 60 MG capsule Take 1 capsule (60 mg total) by mouth daily. Pharmacy-please d/c rx for omeprazole or Dexilant 30 mg 30 capsule 0   EPIPEN 2-PAK 0.3 MG/0.3ML SOAJ injection      FLUoxetine (PROZAC) 20 MG capsule TAKE 1 CAPSULE BY MOUTH EVERY DAY (Patient taking differently: Take 20 mg by mouth daily. ) 90 capsule 1   fluticasone (FLONASE) 50 MCG/ACT nasal spray instill 2 sprays into each nostril once daily (Patient taking differently: Place 2 sprays into both nostrils daily. ) 16 g 1   levocetirizine (XYZAL) 5 MG tablet Take 5 mg by mouth every evening.      LINZESS 145 MCG CAPS capsule TAKE 1 CAPSULE (145 MCG TOTAL) BY MOUTH DAILY BEFORE BREAKFAST. (Patient taking differently: Take 145 mcg by mouth daily before breakfast. ) 90 capsule 1   magnesium oxide (MAG-OX) 400 MG tablet Take 400 mg by mouth daily.     montelukast (SINGULAIR) 10 MG tablet Take 10 mg by mouth at bedtime.      Multiple Vitamin (MULTIVITAMINS PO) Take 1 tablet by mouth daily.       polyethylene glycol (MIRALAX / GLYCOLAX) packet Take 17 g by mouth daily.     valACYclovir (VALTREX) 500 MG tablet Take 1 tablet (500 mg total) by mouth daily. 90 tablet 3   No current facility-administered medications for this visit.     COVID-19 Education: The signs and symptoms of COVID-19 were discussed with the patient and how to seek care for testing if needed. The importance of social distancing was discussed today.  Reviewed expectations re: course of current medical issues.  Discussed self-management of symptoms.  Outlined signs and symptoms indicating need for more acute intervention.  Patient verbalized understanding and all questions were answered.  Health  Maintenance issues including appropriate healthy diet, exercise, and smoking avoidance were discussed with patient.  See orders for this visit as documented in the electronic medical record.  Records requested if needed. Time spent: 40 minutes, of which >50% was spent in obtaining information about her symptoms, reviewing her previous labs, evaluations, and treatments, counseling her about her condition (please see the discussed topics above), and developing a plan to further investigate it; she had a number of questions which I addressed.     Assessment and Plan:     Syncope No further episodes.  Etiologyappears most likely mvasovagal/orthostatic by history Orthostatic hypotension improved, status post IV fluids.  BP Readings from Last 3 Encounters:  11/08/19 (!) 99/52  10/25/19 103/63  09/24/19 (!) 124/56    Echo showed EF of 60 to 49%, grade 1 diastolic dysfunction TSH WNL Cardiology consulted, recommend 30-day event monitor, with subsequent follow-up- she does not appear to have an appointment scheduled with cardiology yet so I placed referral for her to see her cardiology- Dr. Ellyn Hack.   Orders Placed This Encounter  Procedures   Ambulatory referral to Cardiology     Bradycardia Patient with bradycardia by EMS into the 40s and reported h/o this issue in the past, has had no further episodes of bradycardia- pulse running 70s -80s.  Follow-up with cardiology in 4 weeks, recommend 30-day event monitor- sees Dr. Ellyn Hack.  Referral for her to see him.  She will call him today.  Orders Placed This Encounter  Procedures   Ambulatory referral to Cardiology     Pain of upper abdomen With diarrhea.Unknown etiology, history of IBS, diverticulosis- CT abdomen unremarkable ? Viral/infectious. Has appointment with GI scheduled.   Currently no pain, no diarrhea/fever/nausea/vomiting  Scheduled to follow-up with GI next month  Hospital discharge follow-up >40 minutes  spent in face to face time with patient, >50% spent in counselling or coordination of care discussing her symptoms, hospital notes, discussing her recommended follow up.   She feels almost "100 %" today.  Hypotension BP Readings from Last 3 Encounters:  11/08/19 (!) 99/52  10/25/19 103/63  09/24/19 (!) 124/56   Resolved.  Will be seeing cardiology.    Recommended follow up: cardiology Future Appointments  Date Time Provider [place]  11/12/2019  8:00 AM Lucille Passy, MD [facility]  11/20/2019  2:30 PM [facility] PREVISIT RM50 [facility]  12/02/2019  1:30 PM Pyrtle, Lajuan Lines, MD [facility]  04/23/2020  2:30 PM [facility] None  04/23/2020  3:00 PM Tish Men, MD [facility] None    Lab/Order associations:   ICD-10-CM   1. Hospital discharge follow-up  Z09   2. Bradycardia  R00.1 Ambulatory referral to Cardiology  3. Vasovagal syncope  R55 Ambulatory referral to Cardiology  4. Irritable bowel syndrome with constipation  K58.1   5. Pain of upper abdomen  R10.10   6. Orthostatic hypotension  I95.1 Ambulatory referral to Cardiology   Return precautions advised.  Arnette Norris, MD

## 2019-11-11 NOTE — Assessment & Plan Note (Addendum)
No further episodes.  Etiologyappears most likely mvasovagal/orthostatic by history Orthostatic hypotension improved, status post IV fluids.  BP Readings from Last 3 Encounters:  11/08/19 (!) 99/52  10/25/19 103/63  09/24/19 (!) 124/56    Echo showed EF of 60 to [id number], grade 1 diastolic dysfunction TSH WNL Cardiology consulted, recommend 30-day event monitor, with subsequent follow-up- she does not appear to have an appointment scheduled with cardiology yet so I placed referral for her to see her cardiology- Dr. Ellyn Hack.   Orders Placed This Encounter  Procedures  . Ambulatory referral to Cardiology

## 2019-11-11 NOTE — Assessment & Plan Note (Signed)
With diarrhea.Unknown etiology, history of IBS, diverticulosis- CT abdomen unremarkable ? Viral/infectious. Has appointment with GI scheduled.   Currently no pain, no diarrhea/fever/nausea/vomiting  Scheduled to follow-up with GI next month

## 2019-11-11 NOTE — Assessment & Plan Note (Addendum)
>  40 minutes spent in face to face time with patient, >50% spent in counselling or coordination of care discussing her symptoms, hospital notes, discussing her recommended follow up.   She feels almost "100 %" today.

## 2019-11-12 ENCOUNTER — Telehealth (INDEPENDENT_AMBULATORY_CARE_PROVIDER_SITE_OTHER): Payer: Managed Care, Other (non HMO) | Admitting: Family Medicine

## 2019-11-12 ENCOUNTER — Telehealth: Payer: Self-pay | Admitting: Radiology

## 2019-11-12 ENCOUNTER — Encounter: Payer: Self-pay | Admitting: Hematology

## 2019-11-12 VITALS — BP 122/71 | HR 72

## 2019-11-12 DIAGNOSIS — R001 Bradycardia, unspecified: Secondary | ICD-10-CM | POA: Diagnosis not present

## 2019-11-12 DIAGNOSIS — R55 Syncope and collapse: Secondary | ICD-10-CM

## 2019-11-12 DIAGNOSIS — I951 Orthostatic hypotension: Secondary | ICD-10-CM

## 2019-11-12 DIAGNOSIS — Z09 Encounter for follow-up examination after completed treatment for conditions other than malignant neoplasm: Secondary | ICD-10-CM | POA: Diagnosis not present

## 2019-11-12 DIAGNOSIS — K581 Irritable bowel syndrome with constipation: Secondary | ICD-10-CM

## 2019-11-12 DIAGNOSIS — R1010 Upper abdominal pain, unspecified: Secondary | ICD-10-CM

## 2019-11-12 NOTE — Telephone Encounter (Signed)
Enrolled patient for a 30 Day [employer] event monitor to be mailed. Brief instructions were gone over with the patient and she knows to expect the monitor to arrive in 3-4 days 

## 2019-11-19 ENCOUNTER — Ambulatory Visit (INDEPENDENT_AMBULATORY_CARE_PROVIDER_SITE_OTHER): Payer: Managed Care, Other (non HMO)

## 2019-11-19 DIAGNOSIS — R55 Syncope and collapse: Secondary | ICD-10-CM | POA: Diagnosis not present

## 2019-11-20 ENCOUNTER — Other Ambulatory Visit: Payer: Self-pay

## 2019-11-20 ENCOUNTER — Encounter: Payer: Self-pay | Admitting: Internal Medicine

## 2019-11-20 ENCOUNTER — Telehealth: Payer: Self-pay

## 2019-11-20 ENCOUNTER — Ambulatory Visit (AMBULATORY_SURGERY_CENTER): Payer: Managed Care, Other (non HMO)

## 2019-11-20 VITALS — Temp 97.0°F | Ht 71.0 in | Wt 177.8 lb

## 2019-11-20 DIAGNOSIS — Z1159 Encounter for screening for other viral diseases: Secondary | ICD-10-CM

## 2019-11-20 DIAGNOSIS — Z1211 Encounter for screening for malignant neoplasm of colon: Secondary | ICD-10-CM

## 2019-11-20 DIAGNOSIS — R9389 Abnormal findings on diagnostic imaging of other specified body structures: Secondary | ICD-10-CM

## 2019-11-20 MED ORDER — NA SULFATE-K SULFATE-MG SULF 17.5-3.13-1.6 GM/177ML PO SOLN: 1.0000 | Freq: Once | ORAL | 0 refills | Status: AC

## 2019-11-20 NOTE — Telephone Encounter (Signed)
Pt states had an episode on 11/07/19 at 3 am where she went to the bathroom, stood up and fainted.  Pt went to the ER and they stated it was a vagal response but wanted her to follow up with cardiology and her echocardiogram from 11/07/19 was normal.  Pt has f/u visit with cardiology on 12/16.  Pt to call after visit to inform PV nurse if cardiology clears her for endo-colon on 12/21.  Pt is wearing Holter monitor until 12/19/19. They told her she could leave it off during her time here.  Do you want pt to complete her monitoring prior to having her endo-colon?  Please advise.

## 2019-11-20 NOTE — Progress Notes (Signed)
No egg or soy allergy known to patient  No issues with past sedation with any surgeries  or procedures, no intubation problems  No diet pills per patient No home 02 use per patient  No blood thinners per patient  Pt denies issues with constipation  No A fib or A flutter  EMMI video sent to pt's e mail  suprep coupon given. Pt states had an episode on 11/07/19 at 3 am where she went to the bathroom, stood up and fainted.  Pt went to the ER and they stated it was a vagal response but wanted her to follow up with cardiology and her echocardiogram from 11/07/19 was normal.  Pt has f/u visit with cardiology on 12/16.  Pt to call after visit to inform PV nurse if cardiology clears her for endo-colon on 12/21. TE to Dr Hilarie Fredrickson and Jenny Reichmann Nulty,CRNA.  Due to the COVID-19 pandemic we are asking patients to follow these guidelines. Please only bring one care partner. Please be aware that your care partner may wait in the car in the parking lot or if they feel like they will be too hot to wait in the car, they may wait in the lobby on the 4th floor. All care partners are required to wear a mask the entire time (we do not have any that we can provide them), they need to practice social distancing, and we will do a Covid check for all patient's and care partners when you arrive. Also we will check their temperature and your temperature. If the care partner waits in their car they need to stay in the parking lot the entire time and we will call them on their cell phone when the patient is ready for discharge so they can bring the car to the front of the building. Also all patient's will need to wear a mask into building.

## 2019-11-21 NOTE — Telephone Encounter (Signed)
Pt notified ok with Dr Hilarie Fredrickson and Lucretia Kern CRNA as long as CARDS agrees and give the okay to proceed with MAC. Pt has OV 12-16 and she will have Cardio make a note in the OV note if its ok to proceed and she will also reach out to PV the 16th as well after her visit.   Note in Room 50 book to follow up the 16th after the OV with cardio

## 2019-11-21 NOTE — Telephone Encounter (Signed)
Dr Hilarie Fredrickson,  Her monitor will not end until 12-19-2019. Are you okay with her proceeding as scheduled on the monitor as long as cardio states it's cleared to proceed??

## 2019-11-21 NOTE — Telephone Encounter (Signed)
Yes, thanks

## 2019-11-21 NOTE — Telephone Encounter (Signed)
Agree that if cleared by cardiology after her Holter monitor then we can proceed in the [facility]

## 2019-11-21 NOTE — Telephone Encounter (Signed)
Di Kindle,  If this pt is cleared by CARDS we can proceed with her procedure.  Thanks,  Osvaldo Angst

## 2019-11-23 ENCOUNTER — Telehealth: Payer: Self-pay | Admitting: Medical

## 2019-11-23 NOTE — Telephone Encounter (Signed)
I received an outpatient notification from [employer] regarding heart monitor for syncope. [employer] Rep reported 11 beats of Vtach at 154 bpm at 8:25 AM. It was spontaneously recorded and not initiated by the patient so no symptoms were recorded. Otherwise Heart monitor shows sinus bradycardia, 56 bpm.  Patient was admitted November 11/26- 11/27 for syncope, thought to be vasovagal. Echo showed EF 60-65% with G1DD. H/o of ASD repair in 1990s. Her last appointment with you Ellyn Hack) was 2017.  I called the patient twice but no one answered. She has an upcoming appointment on 12/16.   Ludmila Ebarb Kathlen Mody, PA-C

## 2019-11-23 NOTE — Telephone Encounter (Signed)
Patient called back and I let her know about the hear monitor call. At 8:25 AM she was standing up cooking breakfast. Denies pre-syncope or syncope. No CP, SOB, or palpitations. I told her if anything changes or she is feeling like she is going to pass out or passes out to go to the ED.

## 2019-11-25 ENCOUNTER — Telehealth: Payer: Self-pay | Admitting: *Deleted

## 2019-11-25 NOTE — Telephone Encounter (Signed)
I reviewed the monitor strip.  Since she was asymptomatic.  At this point we would not really do much between now when I see her back.  We can discuss what it means.  Candace Hew, MD

## 2019-11-25 NOTE — Telephone Encounter (Signed)
Pt to keeping upcoming appt ./cy

## 2019-11-25 NOTE — Telephone Encounter (Signed)
[employer] faxed critical recording from 11/23/19 at 7:30 am CST sinus bradycardia w/Run of V_tach (11 beats) Per pt was cooking breakfast at that time no symptoms./cy  Recording left for Dr Ellyn Hack to review as pt has appt with Dr Ellyn Hack on Wednesday [facility] .Adonis Housekeeper

## 2019-11-27 ENCOUNTER — Ambulatory Visit: Payer: Managed Care, Other (non HMO) | Admitting: Cardiology

## 2019-11-27 ENCOUNTER — Encounter: Payer: Self-pay | Admitting: Cardiology

## 2019-11-27 ENCOUNTER — Ambulatory Visit (INDEPENDENT_AMBULATORY_CARE_PROVIDER_SITE_OTHER): Payer: Managed Care, Other (non HMO)

## 2019-11-27 ENCOUNTER — Other Ambulatory Visit: Payer: Self-pay | Admitting: Internal Medicine

## 2019-11-27 ENCOUNTER — Telehealth (HOSPITAL_COMMUNITY): Payer: Self-pay

## 2019-11-27 ENCOUNTER — Other Ambulatory Visit: Payer: Self-pay

## 2019-11-27 VITALS — BP 112/68 | HR 53 | Ht 71.0 in | Wt 178.4 lb

## 2019-11-27 DIAGNOSIS — Z1159 Encounter for screening for other viral diseases: Secondary | ICD-10-CM

## 2019-11-27 DIAGNOSIS — Z01810 Encounter for preprocedural cardiovascular examination: Secondary | ICD-10-CM

## 2019-11-27 DIAGNOSIS — R55 Syncope and collapse: Secondary | ICD-10-CM | POA: Diagnosis not present

## 2019-11-27 DIAGNOSIS — I4729 Other ventricular tachycardia: Secondary | ICD-10-CM | POA: Insufficient documentation

## 2019-11-27 DIAGNOSIS — R001 Bradycardia, unspecified: Secondary | ICD-10-CM

## 2019-11-27 DIAGNOSIS — G4733 Obstructive sleep apnea (adult) (pediatric): Secondary | ICD-10-CM

## 2019-11-27 DIAGNOSIS — I472 Ventricular tachycardia: Secondary | ICD-10-CM | POA: Diagnosis not present

## 2019-11-27 DIAGNOSIS — Q211 Atrial septal defect: Secondary | ICD-10-CM

## 2019-11-27 DIAGNOSIS — Q2111 Secundum atrial septal defect: Secondary | ICD-10-CM

## 2019-11-27 NOTE — Telephone Encounter (Signed)
Encounter complete. 

## 2019-11-27 NOTE — Progress Notes (Signed)
Primary Care Provider: Lucille Passy, MD Cardiologist: No primary care provider on file. Electrophysiologist:   Clinic Note: Chief Complaint  Patient presents with  . Hospitalization Follow-up    Also delayed follow-up  . Loss of Consciousness    ER visit, overnight stay  . Bradycardia    Monitor results    HPI:    [name] is a 58 y.o. female with a history of ASD repair, IBS, endometriosis and monoclonal B-cell lymphocytosis along with hypertension anxiety who is being seen today for the [place] at the request of Lucille Passy, MD.  [name] was last seen on by me back in June 2017 She was a former long-standing patient of Dr. Chase Picket from the 1990s had a history of ASD repair by Dr. Servando Snare in the late 1989. She is also had varicose vein ablation surgery on RLE by Dr. Donnetta Hutching in 2008  Recent Hospitalizations:   11/26-27/2020: Presented to ER with syncopal episode.  Passed out after getting up to go to the bathroom.  Was noted to be sweating profusely, felt nauseated.  Her husband got her up and [place] on the toilet, her head low back and eyes rolled back and she lost consciousness again for what seemed like 10 seconds.  Noted poor balance and significant fatigue since then.  She did have a bout of diarrhea when she sat back on the toilet.  BP was low (70/40 mmHg, and was also noted to have heart rate 40 bpm). ->  Given 1 L of IV fluids but remained orthostatic.  Right upper quadrant ultrasound not revealing.  Syncope thought related to orthostatic/micturition/vasovagal syncope.  She was treated with IV hydration, normal hydration.  30-day monitor recommended for bradycardia and syncope.  Reviewed  CV studies:    The following studies were reviewed today: (if available, images/films reviewed: From Epic Chart or Care Everywhere) . TTE November 07, 2019: EF 60 to 55%.  LVH.  GR 1 DD, with moderate LA dilation.   Relatively normal valves.  RA pressure estimated 8 mmHg. [name] She is currently wearing her 30-day event monitor.   . Brief review of her monitor as it stands, full report not available: On 11/23/2019 at 7:25 AM she had baseline sinus bradycardia rates in the 50s with a staggered run of wide-complex tachycardia of 11 beats.  Otherwise has noted mostly sinus rhythm and sinus bradycardia rates ranging from 46 and 50 bpm up to 148 bpm and sinus tachycardia.  No significant PACs and PVCs noted.  Mostly bradycardia spells are during the sleeping hours.  Interval History:   [name] - "Barbie" is here today to discuss her syncopal episodes. What she tells me is that on the day that she went to the emergency room, she was doing fine but she and her family had gotten together after Thanksgiving and they were eating pizza that was extra cheese soup with cheese in the dough.  They were playing games and chatting with family members and she probably lost track of how much she ate.  Did not think much about it, but that night when she went down to bed she started noticing some pretty significant abdominal discomfort and bloating.  She got up to go to the bathroom and felt very queasy.  She went to the bathroom sat down and as far she knows must of urinated because she had the toilet paper in her hand but never wiped.  She fell  from the commode into the shower and bruised herself a little bit. Her husband heard her fall, and when he came in to see her, she was pretty confused.  He set her up got her back on the commode to clean her up at which time she had a pretty violent episode of diarrhea followed by basely passing out again.  Her husband this time witnessed it and noted that she had her eyes rolled back started gurgling, but with no seizure type movement.  She completely lost consciousness, but he was able to catch her before she truly fell down. They called 911 and when she arrived, she was very confused  unable to remember things for a while.  It took a long time for her blood sugar levels to come up and her heart rate was also pretty slow when this happened.  Her heart rate stayed fairly low, and she was uncertain enough that they actually did take her to the emergency room.  She does not recall having any sensation whatsoever of irregular heartbeats, skipped beats flip-flopping.  Nothing like that prior to her past initial or second passout spell.  She did not feel chest tightness or pressure, had no dyspnea.  Since that evening, she has not had any further symptoms.  She does not recall anything while wearing her monitor.  There was an episode with 11 beats of nonsustained V. tach during which she states she did gotten up and was about ready to start cooking breakfast.  Her leg was hurting her, but she denied any sensation of palpitations.  CV Review of Symptoms (Summary) Cardiovascular ROS: no chest pain or dyspnea on exertion positive for - loss of consciousness negative for - edema, orthopnea, palpitations, paroxysmal nocturnal dyspnea, rapid heart rate, shortness of breath or TIA or amaurosis fugax, seizure type symptoms, claudication  The patient does not have symptoms concerning for COVID-19 infection (fever, chills, cough, or new shortness of breath).  The patient is practicing social distancing. ++ Masking.  Occasionally goes out for groceries/shopping.    REVIEWED OF SYSTEMS   A comprehensive ROS was performed.  Pertinent symptoms noted above Review of Systems  Constitutional: Negative for malaise/fatigue and weight loss.  HENT: Negative for congestion and nosebleeds.   Respiratory: Negative for shortness of breath.   Cardiovascular: Negative for leg swelling.  Gastrointestinal: Positive for diarrhea (Only that 1 episode noted in HPI). Negative for blood in stool and melena.  Genitourinary: Negative for dysuria and hematuria.  Musculoskeletal: Negative for falls (With the  exception of her syncopal episode) and joint pain.  Neurological: Positive for loss of consciousness (As noted in HPI). Negative for dizziness (With exception of right after her passout spell), sensory change, speech change and focal weakness.  Psychiatric/Behavioral: Negative for memory loss. The patient is not nervous/anxious and does not have insomnia.   All other systems reviewed and are negative.    I have reviewed and (if needed) personally updated the patient's problem list, medications, allergies, past medical and surgical history, social and family history.   PAST MEDICAL HISTORY   Past Medical History:  Diagnosis Date  . Allergic rhinitis   . Allergy    dust  . Anxiety   . Back pain   . Contact lens/glasses fitting   . Depression   . Endometriosis   . Fatigue   . GERD (gastroesophageal reflux disease)   . Hiatal hernia   . History of Ostium Secundum Atrial Septal Defect 1989   Status post  repair in 1989/1990  . Hx of migraines   . IBS (irritable bowel syndrome)   . Incontinence   . Intestinal volvulus [facility]) May 2007  . Loss of appetite   . Onychomycosis   . Sleep apnea    uses mouthguard  . Syncope 11/07/2019  . Varicose veins      PAST SURGICAL HISTORY   Past Surgical History:  Procedure Laterality Date  . ABDOMINAL HYSTERECTOMY  2004  . ASD REPAIR, SECUNDUM  1990   Dr. Servando Snare  . BALLOON SINUPLASTY Left   . COLONOSCOPY    . EYE SURGERY     muscle reattachment 02/2017  . hysterectomy for severe endometriosis    . NASAL SEPTUM SURGERY     Dr. Georgia Lopes  . OPEN REDUCTION INTERNAL FIXATION (ORIF) DISTAL RADIAL FRACTURE Right 06/20/2017   Procedure: OPEN REDUCTION INTERNAL FIXATION (ORIF) DISTAL RADIAL FRACTURE;  Surgeon: Leanora Cover, MD;  Location: [place];  Service: Orthopedics;  Laterality: Right;  . right ankle surgery  2010  . right leg fracture with surgery     done with ankle surgery  . TRANSTHORACIC ECHOCARDIOGRAM   January  2012   Normal LV size and function, EF greater than 55%. Mild LA dilation. No intra-atrial shunt with bubble study. Normal pulmonary pressures. Only trace to mild mitral and tricuspid regurgitation.  . TRANSTHORACIC ECHOCARDIOGRAM  11/07/2019   EF 60 to 55%.  LVH.  GR 1 DD, with moderate LA dilation.  Relatively normal valves.  RA pressure estimated 8 mmHg.  [name] UPPER GASTROINTESTINAL ENDOSCOPY    . varicose laser vein surgery  2009     MEDICATIONS/ALLERGIES   Current Meds  Medication Sig  . acetaminophen (TYLENOL) 500 MG tablet Take 1,000 mg by mouth every 6 (six) hours as needed for mild pain.  [name] albuterol (VENTOLIN HFA) 108 (90 Base) MCG/ACT inhaler albuterol sulfate HFA 90 mcg/actuation aerosol inhaler  INL 1 TO 2 PFS PO Q 4 TO 6 H PRF COUGH OR WHZ  . Ascorbic Acid (VITA-C PO) Take by mouth.  . Azelastine HCl 137 MCG/SPRAY SOLN Place 1 spray into both nostrils daily.   [name] b complex vitamins capsule Take 1 capsule by mouth daily.  . Calcium Carb-Cholecalciferol (CALTRATE 600+D) 600-800 MG-UNIT TABS Take 1 tablet by mouth 2 (two) times daily.  . cycloSPORINE (RESTASIS) 0.05 % ophthalmic emulsion Place 1 drop into both eyes 2 (two) times daily.   [name] dexlansoprazole (DEXILANT) 60 MG capsule Take 1 capsule (60 mg total) by mouth daily. Pharmacy-please d/c rx for omeprazole or Dexilant 30 mg  . EPIPEN 2-PAK 0.3 MG/0.3ML SOAJ injection   . FLUoxetine (PROZAC) 20 MG capsule TAKE 1 CAPSULE BY MOUTH EVERY DAY (Patient taking differently: Take 20 mg by mouth daily. )  . fluticasone (FLONASE) 50 MCG/ACT nasal spray instill 2 sprays into each nostril once daily (Patient taking differently: Place 2 sprays into both nostrils daily. )  . levocetirizine (XYZAL) 5 MG tablet Take 5 mg by mouth every evening.   [name] LINZESS 145 MCG CAPS capsule TAKE 1 CAPSULE (145 MCG TOTAL) BY MOUTH DAILY BEFORE BREAKFAST. (Patient taking differently: Take 145 mcg by mouth daily before breakfast. )  . magnesium oxide (MAG-OX)  400 MG tablet Take 400 mg by mouth daily.  . montelukast (SINGULAIR) 10 MG tablet Take 10 mg by mouth at bedtime.   . Multiple Vitamin (MULTIVITAMINS PO) Take 1 tablet by mouth daily.    . polyethylene glycol (MIRALAX / GLYCOLAX) packet Take  17 g by mouth daily.  . valACYclovir (VALTREX) 500 MG tablet Take 1 tablet (500 mg total) by mouth daily.    Allergies  Allergen Reactions  . Chlordiazepoxide-Clidinium     "loopy"  . Codeine     REACTION: nausea,vomitting and dizziness  . Penicillins     REACTION: hives ---taken as a child     SOCIAL HISTORY/FAMILY HISTORY   Social History   Tobacco Use  . Smoking status: Former Smoker    Packs/day: 1.00    Years: 10.00    Pack years: 10.00    Types: Cigarettes    Quit date: 12/12/1988    Years since quitting: 30.9  . Smokeless tobacco: Never Used  Substance Use Topics  . Alcohol use: Yes    Comment: social use/wine rare  . Drug use: No   Social History   Social History Narrative   They have 2 children and at least two grandchildren. She does all her activities of daily living. She works as a Midwife for [employer].     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 She does other days of the gym and she is able to at least 60 minutes a day 5 days a week.   She is a former smoker who quit in 1990. This was after smoking a pack a day for 10 years. She drinks social alcohol on occasion.    Family History family history includes Clotting disorder in her maternal grandmother and sister; Colon cancer in her paternal grandfather; Diabetes in her mother; Heart disease in her maternal grandfather and sister; Lung cancer in her maternal grandmother; Prostate cancer in her maternal grandfather.   OBJCTIVE -PE, EKG, labs   Wt Readings from Last 3 Encounters:  11/27/19 178 lb 6.4 oz (80.9 kg)  11/20/19 177 lb 12.8 oz (80.6 kg)  11/08/19 180  lb 6.4 oz (81.8 kg)    Physical Exam: BP 112/68   Pulse (!) 53   Ht 5\' 11"  (1.803 m)   Wt 178 lb 6.4 oz (80.9 kg)   SpO2 98%   BMI 24.88 kg/m  Physical Exam  Constitutional: She is oriented to person, place, and time. She appears well-developed and well-nourished. No distress.  HENT:  Head: Normocephalic and atraumatic.  Neck: No hepatojugular reflux and no JVD present. Carotid bruit is not present.  Cardiovascular: Normal rate, regular rhythm, normal heart sounds and normal pulses.  No extrasystoles are present. PMI is not displaced. Exam reveals no gallop and no friction rub.  No murmur heard. Pulmonary/Chest: Breath sounds normal. No respiratory distress. She has no wheezes. She has no rales.  Abdominal: Soft. Bowel sounds are normal. She exhibits no distension. There is no abdominal tenderness. There is no rebound.  Musculoskeletal:        General: No edema. Normal range of motion.     Cervical back: Normal range of motion and neck supple.  Neurological: She is alert and oriented to person, place, and time.  Psychiatric: She has a normal mood and affect. Her behavior is normal. Judgment and thought content normal.  Vitals reviewed.   Adult ECG Report None  Recent Labs:    Lab Results  Component Value Date   CHOL 123 11/08/2019   HDL 34 (L) 11/08/2019   LDLCALC 77 11/08/2019   TRIG 58 11/08/2019   CHOLHDL 3.6 11/08/2019   Lab Results  Component Value Date   CREATININE 0.65 11/08/2019   BUN 9 11/08/2019   NA 139 11/08/2019   K 3.4 (L) 11/08/2019   CL 104 11/08/2019   CO2 26 11/08/2019    ASSESSMENT/PLAN    Problem List Items Addressed This Visit    ATRIAL SEPTAL DEFECT - Status Post Repair (Chronic)    No real comment about this on her most recent echo, but in 2012 echo did not show any evidence of intra-atrial septal defect with bubble study.  No signs of right-sided heart failure to suggest recurrence or dehiscence of the closure.      Relevant Orders    LEXISCAN---MYOCARDIAL PERFUSION IMAGING   OSA (obstructive sleep apnea)   Syncope and collapse - Primary    Her episode is quite clearly associated with vasovagal episodes.  She was orthostatic in the ER.  Both episodes associated with either post micturition or post diarrhea which serves to exacerbate orthostasis with vasovagal tone.  That would explain her bradycardia although she does have resting bradycardia.  The lack of tachycardic response to low blood pressure correlates well with increased vasovagal tone from GI upset.  Monitor so far does not show any thing that would explain syncope, and echo was normal.  She has no carotid bruit and carotid Dopplers are relatively unhelpful..      Bradycardia on ECG    Her monitor shows that during hours of sleep her heart rates to go when to the 40s and that was probably made worse during her vasovagal episode with syncope. She is not on any AV nodal agents, no certainly would avoid any.  She is able to get her heart rate up into the 140s with activity as noted on the monitor and she has a smart watch that indicates adequate responsiveness. No indication for pacemaker.      Nonsustained ventricular tachycardia ([place])    1 brief asymptomatic episode.  This did not have anything to do with why she would have passed out. Simply for closure sake, will would like to check an ischemic evaluation with a Myoview stress test, but this should not delay any upcoming procedures or trips etc.  Would try to avoid beta-blocker in the setting of ongoing resting bradycardia.      Relevant Orders   LEXISCAN---MYOCARDIAL PERFUSION IMAGING   Preop cardiovascular exam    She has an upcoming colonoscopy and endoscopy planned.  Was concerned about this recent event causing some issues.  The episode is very consistent with vasovagal mediated syncope.  The first episode was probably micturition related but also associated with GI upset.  The second episode was  following an explosive episode of diarrhea which and that itself could cause her to syncopized.  No real arrhythmias noted on the monitor and her echocardiogram is pretty normal.  There was a brief little 1 NSVT but that is benign as it was asymptomatic.  We are going to assess with a Myoview for completion sake, but this should have no bearing on her having colonoscopy or endoscopy.  I would not wait for that to be done prior to endoscopy.  Endoscopy and colonoscopy both low risk procedures which require moderate sedation and do not require additional cardiac evaluation.          COVID-19 Education: The signs and symptoms of COVID-19 were discussed with the patient and how to seek care for testing (follow up with PCP or arrange E-visit).   The importance of social distancing was discussed today.  I spent a total of 67minutes with the patient and chart review. >  50% of the time was spent in direct patient consultation.  Additional time spent with chart review (studies, outside notes, etc): 10 Total Time: 28 min   Current medicines are reviewed at length with the patient today.  (+/- concerns) n/a -> Noted that she needed preop assessment for colonoscopy and endoscopy.   Patient Instructions / Medication Changes & Studies & Tests Ordered   Patient Instructions  Medication Instructions:  No changes   Lab Work: Not needed   Testing/Procedures:  WILL BE SCHEDULE AT [place] [facility] Your physician has requested that you have a lexiscan myoview. For further information please visit [email or web address]. Please follow instruction sheet, as given.    Follow-Up: At [facility], you and your health needs are our priority.  As part of our continuing mission to provide you with exceptional heart care, we have created designated Provider Care Teams.  These Care Teams include your primary Cardiologist (physician) and Advanced Practice Providers (APPs -  Physician  Assistants and Nurse Practitioners) who all work together to provide you with the care you need, when you need it.  Your next appointment:   6 month(s)  The format for your next appointment:   In Person  Provider:   Glenetta Hew, MD  Other Instructions   RECOMMEND  WATCHING HOW YOU EAT A HEAVY MEAL ESPECIALLY WITH CHEESE  This should be no and need to delay upcoming colonoscopy and endoscopy.  We do not need to wait for your stress test to have this done.  These are low risk procedures and can be done without any further evaluation.    Studies Ordered:   Orders Placed This Encounter  Procedures  . LEXISCAN---MYOCARDIAL PERFUSION IMAGING     Glenetta Hew, M.D., M.S. Interventional Cardiologist   Pager # [phone] Phone # [phone] [address] [place], [place] [address]   Thank you for choosing Heartcare at [facility]!!

## 2019-11-27 NOTE — Telephone Encounter (Signed)
Pt saw cardio today- she brought a letter stating  She is okay to proceed with egd/colon low risk from a cardiac standpoint-per Glenetta Hew MD  Will have Lillie Columbia scan in patients chart

## 2019-11-27 NOTE — Patient Instructions (Addendum)
Medication Instructions:  No changes   Lab Work: Not needed   Testing/Procedures:  WILL BE SCHEDULE AT [place] has requested that you have a lexiscan myoview. For further information please visit [email or web address]. Please follow instruction sheet, as given.    Follow-Up: At [facility], you and your health needs are our priority.  As part of our continuing mission to provide you with exceptional heart care, we have created designated Provider Care Teams.  These Care Teams include your primary Cardiologist (physician) and Advanced Practice Providers (APPs -  Physician Assistants and Nurse Practitioners) who all work together to provide you with the care you need, when you need it.  Your next appointment:   6 month(s)  The format for your next appointment:   In Person  Provider:   Glenetta Hew, MD  Other Instructions   RECOMMEND  WATCHING HOW YOU EAT A HEAVY MEAL ESPECIALLY WITH CHEESE  This should be no and need to delay upcoming colonoscopy and endoscopy.  We do not need to wait for your stress test to have this done.  These are low risk procedures and can be done without any further evaluation.

## 2019-11-28 ENCOUNTER — Encounter: Payer: Self-pay | Admitting: Cardiology

## 2019-11-28 DIAGNOSIS — Z0000 Encounter for general adult medical examination without abnormal findings: Secondary | ICD-10-CM | POA: Insufficient documentation

## 2019-11-28 DIAGNOSIS — Z01810 Encounter for preprocedural cardiovascular examination: Secondary | ICD-10-CM | POA: Insufficient documentation

## 2019-11-28 LAB — SARS CORONAVIRUS 2 (TAT 6-24 HRS): SARS Coronavirus 2: NEGATIVE

## 2019-11-28 NOTE — Assessment & Plan Note (Signed)
Her monitor shows that during hours of sleep her heart rates to go when to the 40s and that was probably made worse during her vasovagal episode with syncope. She is not on any AV nodal agents, no certainly would avoid any.  She is able to get her heart rate up into the 140s with activity as noted on the monitor and she has a smart watch that indicates adequate responsiveness. No indication for pacemaker.

## 2019-11-28 NOTE — Assessment & Plan Note (Signed)
1 brief asymptomatic episode.  This did not have anything to do with why she would have passed out. Simply for closure sake, will would like to check an ischemic evaluation with a Myoview stress test, but this should not delay any upcoming procedures or trips etc.  Would try to avoid beta-blocker in the setting of ongoing resting bradycardia.

## 2019-11-28 NOTE — Assessment & Plan Note (Signed)
Her episode is quite clearly associated with vasovagal episodes.  She was orthostatic in the ER.  Both episodes associated with either post micturition or post diarrhea which serves to exacerbate orthostasis with vasovagal tone.  That would explain her bradycardia although she does have resting bradycardia.  The lack of tachycardic response to low blood pressure correlates well with increased vasovagal tone from GI upset.  Monitor so far does not show any thing that would explain syncope, and echo was normal.  She has no carotid bruit and carotid Dopplers are relatively unhelpful.[name]

## 2019-11-28 NOTE — Assessment & Plan Note (Signed)
She has an upcoming colonoscopy and endoscopy planned.  Was concerned about this recent event causing some issues.  The episode is very consistent with vasovagal mediated syncope.  The first episode was probably micturition related but also associated with GI upset.  The second episode was following an explosive episode of diarrhea which and that itself could cause her to syncopized.  No real arrhythmias noted on the monitor and her echocardiogram is pretty normal.  There was a brief little 1 NSVT but that is benign as it was asymptomatic.  We are going to assess with a Myoview for completion sake, but this should have no bearing on her having colonoscopy or endoscopy.  I would not wait for that to be done prior to endoscopy.  Endoscopy and colonoscopy both low risk procedures which require moderate sedation and do not require additional cardiac evaluation.

## 2019-11-28 NOTE — Assessment & Plan Note (Signed)
No real comment about this on her most recent echo, but in 2012 echo did not show any evidence of intra-atrial septal defect with bubble study.  No signs of right-sided heart failure to suggest recurrence or dehiscence of the closure.

## 2019-11-28 NOTE — Telephone Encounter (Signed)
Letter is actually under "letters" tab in Epic dated 11/27/19.

## 2019-11-29 ENCOUNTER — Other Ambulatory Visit: Payer: Self-pay

## 2019-11-29 ENCOUNTER — Ambulatory Visit (HOSPITAL_COMMUNITY)
Admission: RE | Admit: 2019-11-29 | Discharge: 2019-11-29 | Disposition: A | Discharge status: Home / Self Care | Payer: Managed Care, Other (non HMO) | Source: Ambulatory Visit | Attending: Cardiovascular Disease | Admitting: Cardiovascular Disease

## 2019-11-29 DIAGNOSIS — Q211 Atrial septal defect: Secondary | ICD-10-CM | POA: Diagnosis present

## 2019-11-29 DIAGNOSIS — I472 Ventricular tachycardia: Secondary | ICD-10-CM | POA: Diagnosis not present

## 2019-11-29 DIAGNOSIS — I4729 Other ventricular tachycardia: Secondary | ICD-10-CM

## 2019-11-29 DIAGNOSIS — Q2111 Secundum atrial septal defect: Secondary | ICD-10-CM

## 2019-11-29 LAB — MYOCARDIAL PERFUSION IMAGING
LV dias vol: 126 mL (ref 46–106)
LV sys vol: 59 mL
Peak HR: 83 {beats}/min
Rest HR: 50 {beats}/min
SDS: 0
SRS: 0
SSS: 0
TID: 1.1

## 2019-11-29 MED ORDER — TECHNETIUM TC 99M TETROFOSMIN IV KIT
30.4000 | PACK | Freq: Once | INTRAVENOUS | Status: AC | PRN
  Administered 2019-11-29: 30.4 via INTRAVENOUS
  Filled 2019-11-29: qty 31

## 2019-11-29 MED ORDER — REGADENOSON 0.4 MG/5ML IV SOLN
0.4000 mg | Freq: Once | INTRAVENOUS | Status: AC
  Administered 2019-11-29: 0.4 mg via INTRAVENOUS

## 2019-11-29 MED ORDER — TECHNETIUM TC 99M TETROFOSMIN IV KIT
9.3000 | PACK | Freq: Once | INTRAVENOUS | Status: AC | PRN
  Administered 2019-11-29: 9.3 via INTRAVENOUS
  Filled 2019-11-29: qty 10

## 2019-12-02 ENCOUNTER — Ambulatory Visit (AMBULATORY_SURGERY_CENTER): Payer: Managed Care, Other (non HMO) | Admitting: Internal Medicine

## 2019-12-02 ENCOUNTER — Other Ambulatory Visit: Payer: Self-pay

## 2019-12-02 ENCOUNTER — Encounter: Payer: Self-pay | Admitting: Internal Medicine

## 2019-12-02 VITALS — BP 108/58 | HR 46 | Temp 98.6°F | Resp 17 | Ht 71.0 in | Wt 177.8 lb

## 2019-12-02 DIAGNOSIS — K3189 Other diseases of stomach and duodenum: Secondary | ICD-10-CM

## 2019-12-02 DIAGNOSIS — K319 Disease of stomach and duodenum, unspecified: Secondary | ICD-10-CM

## 2019-12-02 DIAGNOSIS — Z1211 Encounter for screening for malignant neoplasm of colon: Secondary | ICD-10-CM | POA: Diagnosis not present

## 2019-12-02 DIAGNOSIS — R9389 Abnormal findings on diagnostic imaging of other specified body structures: Secondary | ICD-10-CM

## 2019-12-02 MED ORDER — SODIUM CHLORIDE 0.9 % IV SOLN: 500.0000 mL | Freq: Once | INTRAVENOUS | Status: DC

## 2019-12-02 NOTE — Progress Notes (Signed)
No problems noted in the recovery room. maw 

## 2019-12-02 NOTE — Progress Notes (Signed)
Temp by JB Vitals by DT  Pt's states no medical or surgical changes since previsit or office visit. 

## 2019-12-02 NOTE — Patient Instructions (Signed)
YOU HAD AN ENDOSCOPIC PROCEDURE TODAY AT THE [facility]:   Refer to the procedure report that was given to you for any specific questions about what was found during the examination.  If the procedure report does not answer your questions, please call your gastroenterologist to clarify.  If you requested that your care partner not be given the details of your procedure findings, then the procedure report has been included in a sealed envelope for you to review at your convenience later.  YOU SHOULD EXPECT: Some feelings of bloating in the abdomen. Passage of more gas than usual.  Walking can help get rid of the air that was put into your GI tract during the procedure and reduce the bloating. If you had a lower endoscopy (such as a colonoscopy or flexible sigmoidoscopy) you may notice spotting of blood in your stool or on the toilet paper. If you underwent a bowel prep for your procedure, you may not have a normal bowel movement for a few days.  Please Note:  You might notice some irritation and congestion in your nose or some drainage.  This is from the oxygen used during your procedure.  There is no need for concern and it should clear up in a day or so.  SYMPTOMS TO REPORT IMMEDIATELY:   Following lower endoscopy (colonoscopy or flexible sigmoidoscopy):  Excessive amounts of blood in the stool  Significant tenderness or worsening of abdominal pains  Swelling of the abdomen that is new, acute  Fever of 100F or higher   Following upper endoscopy (EGD)  Vomiting of blood or coffee ground material  New chest pain or pain under the shoulder blades  Painful or persistently difficult swallowing  New shortness of breath  Fever of 100F or higher  Black, tarry-looking stools  For urgent or emergent issues, a gastroenterologist can be reached at any hour by calling [phone].   DIET:  We do recommend a small meal at first, but then you may proceed to your regular diet.  Drink  plenty of fluids but you should avoid alcoholic beverages for 24 hours.  ACTIVITY:  You should plan to take it easy for the rest of today and you should NOT DRIVE or use heavy machinery until tomorrow (because of the sedation medicines used during the test).    FOLLOW UP: Our staff will call the number listed on your records 48-72 hours following your procedure to check on you and address any questions or concerns that you may have regarding the information given to you following your procedure. If we do not reach you, we will leave a message.  We will attempt to reach you two times.  During this call, we will ask if you have developed any symptoms of COVID 19. If you develop any symptoms (ie: fever, flu-like symptoms, shortness of breath, cough etc.) before then, please call [phone].  If you test positive for Covid 19 in the 2 weeks post procedure, please call and report this information to us.    If any biopsies were taken you will be contacted by phone or by letter within the next 1-3 weeks.  Please call us at [phone] if you have not heard about the biopsies in 3 weeks.    SIGNATURES/CONFIDENTIALITY: You and/or your care partner have signed paperwork which will be entered into your electronic medical record.  These signatures attest to the fact that that the information above on your After Visit Summary has been reviewed and is   understood.  Full responsibility of the confidentiality of this discharge information lies with you and/or your care-partner.    Handout was given to you on diverticulosis. You may resume your current medications today. Await biopsy results. Repeat colonoscopy in 10 years for screening purposes. Please call if any questions or concerns.

## 2019-12-02 NOTE — Op Note (Signed)
[facility] Patient Name: [name] Procedure Date: 12/02/2019 1:16 PM MRN: [id number] Endoscopist: Jerene Bears , MD Age: 58 Referring MD:  Date of Birth: 01/30/1961 Gender: Female Account #: [id number] Procedure:                Upper GI endoscopy Indications:              Gastro-esophageal reflux disease, Abnormal CT of                            the GI tract suggestive of gastric wall thickening Medicines:                Monitored Anesthesia Care Procedure:                Pre-Anesthesia Assessment:                           - Prior to the procedure, a History and Physical                            was performed, and patient medications and                            allergies were reviewed. The patient's tolerance of                            previous anesthesia was also reviewed. The risks                            and benefits of the procedure and the sedation                            options and risks were discussed with the patient.                            All questions were answered, and informed consent                            was obtained. Prior Anticoagulants: The patient has                            taken no previous anticoagulant or antiplatelet                            agents. ASA Grade Assessment: II - A patient with                            mild systemic disease. After reviewing the risks                            and benefits, the patient was deemed in                            satisfactory condition to undergo the procedure.  After obtaining informed consent, the endoscope was                            passed under direct vision. Throughout the                            procedure, the patient's blood pressure, pulse, and                            oxygen saturations were monitored continuously. The                            Endoscope was introduced through the mouth, and   advanced to the second part of duodenum. The upper                            GI endoscopy was accomplished without difficulty.                            The patient tolerated the procedure well. Scope In: Scope Out: Findings:                 The examined esophagus was normal.                           Patchy mildly erythematous mucosa without bleeding                            was found in the gastric fundus and in the gastric                            body. Biopsies were taken with a cold forceps for                            histology and Helicobacter pylori testing (gastric                            body, antrum and incisura).                           A few diminutive and small sessile polyps were                            found in the gastric fundus and in the gastric body                            (these polyp have the typical appearance of benign                            fundic gland polyps).                           The exam of the stomach was otherwise normal.  The examined duodenum was normal. Complications:            No immediate complications. Estimated Blood Loss:     Estimated blood loss was minimal. Impression:               - Normal esophagus.                           - Erythematous mucosa in the gastric fundus and                            gastric body. Biopsied to exclude infection.                           - A few, benign, gastric polyps.                           - Otherwise normal stomach. No ulcers or concerning                            lesions.                           - Normal examined duodenum. Recommendation:           - Patient has a contact number available for                            emergencies. The signs and symptoms of potential                            delayed complications were discussed with the                            patient. Return to normal activities tomorrow.                            Written  discharge instructions were provided to the                            patient.                           - Resume previous diet.                           - Continue present medications.                           - Await pathology results. Jerene Bears, MD 12/02/2019 2:11:05 PM This report has been signed electronically.

## 2019-12-02 NOTE — Op Note (Signed)
[facility] Patient Name: [name] Procedure Date: 12/02/2019 1:15 PM MRN: [id number] Endoscopist: Jerene Bears , MD Age: 58 Referring MD:  Date of Birth: 02-01-1961 Gender: Female Account #: [id number] Procedure:                Colonoscopy Indications:              Screening for colorectal malignant neoplasm Medicines:                Monitored Anesthesia Care Procedure:                Pre-Anesthesia Assessment:                           - Prior to the procedure, a History and Physical                            was performed, and patient medications and                            allergies were reviewed. The patient's tolerance of                            previous anesthesia was also reviewed. The risks                            and benefits of the procedure and the sedation                            options and risks were discussed with the patient.                            All questions were answered, and informed consent                            was obtained. Prior Anticoagulants: The patient has                            taken no previous anticoagulant or antiplatelet                            agents. ASA Grade Assessment: II - A patient with                            mild systemic disease. After reviewing the risks                            and benefits, the patient was deemed in                            satisfactory condition to undergo the procedure.                           After obtaining informed consent, the colonoscope  was passed under direct vision. Throughout the                            procedure, the patient's blood pressure, pulse, and                            oxygen saturations were monitored continuously. The                            Colonoscope was introduced through the anus and                            advanced to the cecum, identified by appendiceal                            orifice and  ileocecal valve. The colonoscopy was                            performed without difficulty. The patient tolerated                            the procedure well. The quality of the bowel                            preparation was good. The ileocecal valve,                            appendiceal orifice, and rectum were photographed. Scope In: 1:46:35 PM Scope Out: 2:04:29 PM Scope Withdrawal Time: 0 hours 13 minutes 52 seconds  Total Procedure Duration: 0 hours 17 minutes 54 seconds  Findings:                 The digital rectal exam was normal.                           Multiple small-mouthed diverticula were found in                            the sigmoid colon.                           The exam was otherwise without abnormality on                            direct and retroflexion views. Complications:            No immediate complications. Estimated Blood Loss:     Estimated blood loss: none. Impression:               - Diverticulosis in the sigmoid colon.                           - The examination was otherwise normal on direct                            and retroflexion views.                           -  No specimens collected. Recommendation:           - Patient has a contact number available for                            emergencies. The signs and symptoms of potential                            delayed complications were discussed with the                            patient. Return to normal activities tomorrow.                            Written discharge instructions were provided to the                            patient.                           - Resume previous diet.                           - Continue present medications.                           - Repeat colonoscopy in 10 years for screening                            purposes. Jerene Bears, MD 12/02/2019 2:12:50 PM This report has been signed electronically.

## 2019-12-02 NOTE — Progress Notes (Signed)
To PACU, VSS. Report to [email or web address] 

## 2019-12-02 NOTE — Progress Notes (Signed)
Called to room to assist during endoscopic procedure.  Patient ID and intended procedure confirmed with present staff. Received instructions for my participation in the procedure from the performing physician.  

## 2019-12-04 ENCOUNTER — Ambulatory Visit: Payer: Managed Care, Other (non HMO) | Attending: Internal Medicine

## 2019-12-04 ENCOUNTER — Telehealth: Payer: Self-pay | Admitting: *Deleted

## 2019-12-04 DIAGNOSIS — Z20822 Contact with and (suspected) exposure to covid-19: Secondary | ICD-10-CM

## 2019-12-04 NOTE — Telephone Encounter (Signed)
  Follow up Call-  Call back number 12/02/2019  Post procedure Call Back phone  # [phone]  Permission to leave phone message Yes  Some recent data might be hidden     Patient questions:  Do you have a fever, pain , or abdominal swelling? No. Pain Score  0 *  Have you tolerated food without any problems? Yes.    Have you been able to return to your normal activities? Yes.    Do you have any questions about your discharge instructions: Diet   No. Medications  No. Follow up visit  No.  Do you have questions or concerns about your Care? No.  Actions: * If pain score is 4 or above: No action needed, pain <4.  1. Have you developed a fever since your procedure? no  2.   Have you had an respiratory symptoms (SOB or cough) since your procedure? no  3.   Have you tested positive for COVID 19 since your procedure no  4.   Have you had any family members/close contacts diagnosed with the COVID 19 since your procedure?  no   If yes to any of these questions please route to Joylene John, RN and Alphonsa Gin, Therapist, sports.

## 2019-12-04 NOTE — Telephone Encounter (Signed)
  Follow up Call-  Call back number 12/02/2019  Post procedure Call Back phone  # [phone]  Permission to leave phone message Yes  Some recent data might be hidden     Patient questions:  Do you have a fever, pain , or abdominal swelling? No. Pain Score  0 *  Have you tolerated food without any problems? Yes.    Have you been able to return to your normal activities? Yes.    Do you have any questions about your discharge instructions: Diet   No. Medications  No. Follow up visit  No.  Do you have questions or concerns about your Care? No.  Actions: * If pain score is 4 or above: No action needed, pain <4.  1. Have you developed a fever since your procedure? no  2.   Have you had an respiratory symptoms (SOB or cough) since your procedure? no  3.   Have you tested positive for COVID 19 since your procedure no  4.   Have you had any family members/close contacts diagnosed with the COVID 19 since your procedure?  no   If yes to any of these questions please route to Joylene John, RN and Alphonsa Gin, Therapist, sports.

## 2019-12-06 LAB — NOVEL CORONAVIRUS, NAA: SARS-CoV-2, NAA: NOT DETECTED

## 2019-12-08 ENCOUNTER — Encounter: Payer: Self-pay | Admitting: Internal Medicine

## 2019-12-09 ENCOUNTER — Other Ambulatory Visit: Payer: Self-pay | Admitting: *Deleted

## 2019-12-09 ENCOUNTER — Encounter: Payer: Self-pay | Admitting: Family Medicine

## 2019-12-09 MED ORDER — DEXLANSOPRAZOLE 60 MG PO CPDR: 60.0000 mg | DELAYED_RELEASE_CAPSULE | Freq: Every day | ORAL | 1 refills | Status: DC

## 2019-12-11 ENCOUNTER — Other Ambulatory Visit: Payer: Self-pay | Admitting: Cardiology

## 2019-12-17 NOTE — Telephone Encounter (Signed)
We will try pantoprazole 40 mg once daily, 30 minutes before breakfast.  Can use a second dose 30 minutes before the last meal the day if symptoms warrant. This may be covered by insurance but also should be affordable without insurance through good [email or web address] Have her try this and let me know

## 2020-01-02 ENCOUNTER — Encounter: Payer: Self-pay | Admitting: Family Medicine

## 2020-01-20 ENCOUNTER — Other Ambulatory Visit: Payer: Self-pay | Admitting: Internal Medicine

## 2020-01-21 ENCOUNTER — Telehealth: Payer: Self-pay | Admitting: *Deleted

## 2020-01-21 NOTE — Telephone Encounter (Signed)
Patient's insurance prefers trulance (preferred #1) or Amitiza 24 mcg over Linzess. Please advise....   Could we try trulance?

## 2020-01-22 MED ORDER — TRULANCE 3 MG PO TABS: 3.0000 mg | ORAL_TABLET | Freq: Every day | ORAL | 2 refills | Status: DC

## 2020-01-22 NOTE — Telephone Encounter (Signed)
Please make patient aware that her insurance is requiring change of her current therapy which is effective for her, currently she is on Linzess Okay to switch to Trulance 3 mg daily if patient agreeable.  After she uses this for 1 to 2 weeks she should let me know if it is ineffective or less effective than prior therapy

## 2020-01-22 NOTE — Telephone Encounter (Signed)
I have spoken to patient to advise that her insurance will no longer cover Linzess. They prefer she take Trulance instead. She is to call us back should her Trulance be ineffective after 1-2 weeks. She verbalizes understanding of this and would like [place] to send the script to [facility].  In addition, she asks for an appointment for follow up just to "stay current " with our office. She had some increasing reflux symptoms not too long ago and wants to make sure all is well. Unfortunately, Dr Hilarie Fredrickson has no availabilities at all. She would like to see an extender since Dr Hilarie Fredrickson is currently unavailable. I have scheduled her to see Nicoletta Ba, PA-C.

## 2020-01-29 NOTE — Telephone Encounter (Signed)
We got insurance denial for Trulance after being advised previously by insurance that Trulance will not require authorization. Upon contacting Christella Scheuermann (phone [phone]), I am told that it does appear that indeed insurance covers Amitiza without prior auth but Trulance needs prior auth. However, a coworker of mine has experienced the exact same issue with another patient which makes it apparent that this is an issue with [employer]'s system rather than [place]. It seems Christella Scheuermann is sending a letter indicating they will cover Trulance without authorization and Amitiza with authorization when in fact it should read Amitiza covered without authorization, Trulance covered without authorization. Upon questioning this I was told that I would have to complete an appeal which could take 30-45 days to review in order to get Trulance approved now that it has been denied. I then asked to a supervisor as I felt it excessive to take this route when this is an issue with [employer] rather than an issue caused by the patient/our office. I was routed to [place], supervisor who told me that she ran test claims and indeed Amitiza was the medication covered without authorization. However, she would suggest peer to peer, not appeal letter. I again explained that I did not feel that was appropriate given the situation. I explained what their form previously indicated and the fact that the patient has already been told several things at this point and adding more would cause confusion. Diane states she cannot see the forms that were faxed out but that it sounds like she needs to put in a request to "flip flop" the notes on the form that they are currently sending out. She also states I will still need to get a peer to peer for the current case. I have scheduled peer to peer on Monday, 02/03/20 at 2pm at which time I will also make known the issue with [employer]. Case [id number].

## 2020-02-03 NOTE — Telephone Encounter (Signed)
I have spoken to the medical director at [facility], phone [phone] who indicates that patient's plan actually should show Amitiza as being covered first. In addition, she indicates that she will look into why Trulance is reading as being covered without prior authorization initially. Dr Hilarie Fredrickson, please advise regarding amitiza.

## 2020-02-04 NOTE — Telephone Encounter (Signed)
Okay for Amitiza 24 mcg twice daily with food

## 2020-02-05 MED ORDER — LUBIPROSTONE 24 MCG PO CAPS: 24.0000 ug | ORAL_CAPSULE | Freq: Two times a day (BID) | ORAL | 5 refills | Status: DC

## 2020-02-05 NOTE — Telephone Encounter (Signed)
I have spoken to patient to advise of conversation with insurance company as well as our need to now try her on Amitiza. She verbalizes understanding and would like Amitiza called to [facility]. Rx sent.

## 2020-02-05 NOTE — Addendum Note (Signed)
Addended by: Larina Bras on: 02/05/2020 09:22 AM   Modules accepted: Orders

## 2020-02-06 MED ORDER — OMEPRAZOLE 20 MG PO CPDR: 20.0000 mg | DELAYED_RELEASE_CAPSULE | Freq: Every day | ORAL | 3 refills | Status: DC

## 2020-02-12 ENCOUNTER — Encounter: Payer: Self-pay | Admitting: Physician Assistant

## 2020-02-12 ENCOUNTER — Ambulatory Visit (INDEPENDENT_AMBULATORY_CARE_PROVIDER_SITE_OTHER): Payer: Managed Care, Other (non HMO) | Admitting: Physician Assistant

## 2020-02-12 VITALS — BP 88/56 | HR 61 | Temp 98.1°F | Ht 71.0 in | Wt 178.0 lb

## 2020-02-12 DIAGNOSIS — K219 Gastro-esophageal reflux disease without esophagitis: Secondary | ICD-10-CM | POA: Diagnosis not present

## 2020-02-12 DIAGNOSIS — K5909 Other constipation: Secondary | ICD-10-CM | POA: Diagnosis not present

## 2020-02-12 MED ORDER — OMEPRAZOLE 20 MG PO CPDR: 20.0000 mg | DELAYED_RELEASE_CAPSULE | Freq: Two times a day (BID) | ORAL | 11 refills | Status: DC

## 2020-02-12 NOTE — Patient Instructions (Signed)
If you are age 61 or older, your body mass index should be between 23-30. Your Body mass index is 24.83 kg/m. If this is out of the aforementioned range listed, please consider follow up with your Primary Care Provider.  If you are age 64 or younger, your body mass index should be between 19-25. Your Body mass index is 24.83 kg/m. If this is out of the aformentioned range listed, please consider follow up with your Primary Care Provider.   Increase your Omeprazole to 20 mg twice daily. A new prescription has been sent to your pharmacy.  Take 3 doses of Miralax this evening and then start taking 2 dose of Miralax daily.  Drink at least 60 oz of water daily  Call back in a week if increase in Miralax is not effective.

## 2020-02-12 NOTE — Progress Notes (Signed)
Subjective:    Patient ID: [name], female    DOB: 1961/01/25, 59 y.o.   MRN: [id number]  HPI Leland is a pleasant 59 year old white female, established with Dr. Hilarie Fredrickson, who comes in today to discuss medications and recent changes to meds due to insurance coverage. She has history of chronic GERD, IBS, chronic constipation, remote history of colonic volvulus, sleep apnea and B-cell lymphocytosis. She was just seen in December 2020 for endoscopy and colonoscopy.  EGD revealed patchy gastritis and a few diminutive polyps, esophagus was normal.  Gastric biopsy showed reactive gastropathy, no H. pylori. Colonoscopy revealed  moderate diverticulosis and no polyps.  Patient says she had been doing well on Dexilant 60 mg p.o. daily for chronic GERD.  At the beginning of the year her insurance company stopped covering [place] and apparently did not want to cover PPI other than omeprazole.  She has been taking omeprazole 20 mg p.o. once daily but occasionally taking a second dose in the evening.  She has been watching her diet carefully.  She feels that she would probably do better with twice daily dosing. She had also been on Linzess 145 mcg daily which was working well for chronic constipation.  Insurance would no longer cover.  Insurance requested she switch to [employer].  She was given a prescription in for Amitiza 24 mcg twice daily, however she has not taken this because she read the side effects which list decrease in blood pressure. She has a chronically low blood pressure and in fact today blood pressure was 88/56.  She had hospitalization last fall due to syncope and bradycardia.  Cardiology recommended a 30-day event monitor.  She says that that was negative and she was not recommended to start any new medications though discharge summary states that midodrine could be considered. As far as her constipation is concerned she is now going 4 to 5 days between bowel movements, and as she gets  more constipated has burning abdominal discomfort bloating and nausea.  She has been taking MiraLAX 17 g in 8 ounces of water at bedtime she says is helpful but not producing very frequent bowel movements.  Review of Systems Pertinent positive and negative review of systems were noted in the above HPI section.  All other review of systems was otherwise negative.  Outpatient Encounter Medications as of 02/12/2020  Medication Sig  . acetaminophen (TYLENOL) 500 MG tablet Take 1,000 mg by mouth every 6 (six) hours as needed for mild pain.  [name] albuterol (VENTOLIN HFA) 108 (90 Base) MCG/ACT inhaler albuterol sulfate HFA 90 mcg/actuation aerosol inhaler  INL 1 TO 2 PFS PO Q 4 TO 6 H PRF COUGH OR WHZ  . Ascorbic Acid (VITA-C PO) Take by mouth.  . Azelastine HCl 137 MCG/SPRAY SOLN Place 1 spray into both nostrils daily.   [name] b complex vitamins capsule Take 1 capsule by mouth daily.  . Calcium Carb-Cholecalciferol (CALTRATE 600+D) 600-800 MG-UNIT TABS Take 1 tablet by mouth 2 (two) times daily.  . cycloSPORINE (RESTASIS) 0.05 % ophthalmic emulsion Place 1 drop into both eyes 2 (two) times daily.   [name] EPIPEN 2-PAK 0.3 MG/0.3ML SOAJ injection   . FLUoxetine (PROZAC) 20 MG capsule TAKE 1 CAPSULE BY MOUTH EVERY DAY (Patient taking differently: Take 20 mg by mouth daily. )  . fluticasone (FLONASE) 50 MCG/ACT nasal spray instill 2 sprays into each nostril once daily (Patient taking differently: Place 2 sprays into both nostrils daily. )  . levocetirizine (XYZAL) 5 MG  tablet Take 5 mg by mouth every evening.   . magnesium oxide (MAG-OX) 400 MG tablet Take 400 mg by mouth daily.  . montelukast (SINGULAIR) 10 MG tablet Take 10 mg by mouth at bedtime.   . Multiple Vitamin (MULTIVITAMINS PO) Take 1 tablet by mouth daily.    [name] omeprazole (PRILOSEC) 20 MG capsule Take 1 capsule (20 mg total) by mouth 2 (two) times daily before a meal.  . polyethylene glycol (MIRALAX / GLYCOLAX) packet Take 17 g by mouth daily.  .  valACYclovir (VALTREX) 500 MG tablet Take 1 tablet (500 mg total) by mouth daily.  . [DISCONTINUED] lubiprostone (AMITIZA) 24 MCG capsule Take 1 capsule (24 mcg total) by mouth 2 (two) times daily with a meal.  . [DISCONTINUED] omeprazole (PRILOSEC) 20 MG capsule Take 1 capsule (20 mg total) by mouth daily.  . [DISCONTINUED] dexlansoprazole (DEXILANT) 60 MG capsule Take 1 capsule (60 mg total) by mouth daily.  . [DISCONTINUED] LINZESS 145 MCG CAPS capsule TAKE 1 CAPSULE (145 MCG TOTAL) BY MOUTH DAILY BEFORE BREAKFAST.  . [DISCONTINUED] Plecanatide (TRULANCE) 3 MG TABS Take 3 mg by mouth daily. Pharmacy-please d/c rc for linzess, insurance will no longer cover   No facility-administered encounter medications on file as of 02/12/2020.   Allergies  Allergen Reactions  . Chlordiazepoxide-Clidinium     "loopy"  . Codeine     REACTION: nausea,vomitting and dizziness  . Penicillins     REACTION: hives ---taken as a child   Patient Active Problem List   Diagnosis Date Noted  . Preop cardiovascular exam 11/28/2019  . Nonsustained ventricular tachycardia ([place]) 11/27/2019  . Hospital discharge follow-up 11/11/2019  . Hypotension 11/11/2019  . Hypokalemia   . Pain of upper abdomen   . Syncope and collapse 11/07/2019  . Bradycardia on ECG 11/07/2019  . Upper respiratory infection 10/03/2019  . Monoclonal B-cell lymphocytosis 09/20/2019  . Neutropenia ([place]) 09/01/2019  . Allergic rhinitis   . OSA (obstructive sleep apnea) 02/20/2019  . HSV (herpes simplex virus) anogenital infection 08/31/2018  . Chronic constipation 05/31/2018  . Eczema 01/09/2016  . GERD (gastroesophageal reflux disease)-probable paroxsysmal relaxation LES 03/14/2012  . Hair loss 06/29/2011  . GERD 02/28/2011  . VARICOSE VEINS, LOWER EXTREMITIES 01/08/2008  . INTESTINAL VOLVULUS, LARGE BOWEL 01/08/2008  . Anxiety 01/07/2008  . IBS (irritable bowel syndrome) 01/07/2008  . MIGRAINES, HX OF 01/07/2008  . ATRIAL SEPTAL  DEFECT - Status Post Repair 01/08/1988   Social History   Socioeconomic History  . Marital status: Married    Spouse name: Richard  . Number of children: Not on file  . Years of education: Not on file  . Highest education level: Not on file  Occupational History  . Occupation: remodel houses    Employer: [employer]  Tobacco Use  . Smoking status: Former Smoker    Packs/day: 1.00    Years: 10.00    Pack years: 10.00    Types: Cigarettes    Quit date: 12/12/1988    Years since quitting: 31.1  . Smokeless tobacco: Never Used  Substance and Sexual Activity  . Alcohol use: Yes    Comment: social use/wine rare  . Drug use: No  . Sexual activity: Not on file  Other Topics Concern  . Not on file  Social History Narrative   They have 2 children and at least two grandchildren. She does all her activities of daily living. She works as a Midwife for [employer].  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 She does other days of the gym and she is able to at least 60 minutes a day 5 days a week.   She is a former smoker who quit in 1990. This was after smoking a pack a day for 10 years. She drinks social alcohol on occasion.   Social Determinants of Health   Financial Resource Strain:   . Difficulty of Paying Living Expenses: Not on file  Food Insecurity:   . Worried About Charity fundraiser in the Last Year: Not on file  . Ran Out of Food in the Last Year: Not on file  Transportation Needs:   . Lack of Transportation (Medical): Not on file  . Lack of Transportation (Non-Medical): Not on file  Physical Activity:   . Days of Exercise per Week: Not on file  . Minutes of Exercise per Session: Not on file  Stress:   . Feeling of Stress : Not on file  Social Connections:   . Frequency of Communication with Friends and Family: Not on file  . Frequency of Social  Gatherings with Friends and Family: Not on file  . Attends Religious Services: Not on file  . Active Member of Clubs or Organizations: Not on file  . Attends Archivist Meetings: Not on file  . Marital Status: Not on file  Intimate Partner Violence:   . Fear of Current or Ex-Partner: Not on file  . Emotionally Abused: Not on file  . Physically Abused: Not on file  . Sexually Abused: Not on file    Ms. Jobin family history includes Clotting disorder in her maternal grandmother and sister; Colon cancer in her paternal grandfather; Diabetes in her mother; Heart disease in her maternal grandfather and sister; Lung cancer in her maternal grandmother; Prostate cancer in her maternal grandfather.      Objective:    Vitals:   02/12/20 1525  BP: (!) 88/56  Pulse: 61  Temp: 98.1 F (36.7 C)    Physical Exam Well-developed well-nourished female in no acute distress.  Height, Weight,178 BMI 24.8  HEENT; nontraumatic normocephalic, EOMI, PER R LA, sclera anicteric. Oropharynx; not examined Neck; supple, no JVD Cardiovascular; regular rate and rhythm with S1-S2, no murmur rub or gallop Pulmonary; Clear bilaterally Abdomen; soft, no focal tenderness, nondistended, no palpable mass or hepatosplenomegaly, bowel sounds are active Rectal; not done today Skin; benign exam, no jaundice rash or appreciable lesions Extremities; no clubbing cyanosis or edema skin warm and dry Neuro/Psych; alert and oriented x4, grossly nonfocal mood and affect appropriate       Assessment & Plan:   #39 59 year old white female with chronic GERD-previously well controlled on Dexilant 60 mg daily which is no longer covered by her insurance. Currently on omeprazole 20 mg p.o. every morning with fair control.  #2 chronic constipation/IBS-previously well controlled on Linzess 145 mcg daily, which is no longer covered by her insurance.  Amitiza was suggested/preferred.  However patient has reviewed side  effects and does not want to take Amitiza due to concerns about hypotension and/or bradycardia. Patient has a chronically low blood pressure and had an episode of bradycardia in the fall 2020 requiring hospitalization.  Plan; increase omeprazole to 20 mg p.o. twice daily AC breakfast and AC dinner, new prescription sent. Continue antireflux regimen. She will take 3 doses of MiraLAX this evening and  then start MiraLAX 2 doses daily either both in the evening or split every morning and p.m.  Of asked her to try this for a week or so and then call back if this regimen is not working well. In the interim will see if we can appeal the decision on Linzess.  [place] Genia Harold PA-C 02/12/2020   Cc: Lucille Passy, MD

## 2020-02-14 ENCOUNTER — Ambulatory Visit: Payer: Managed Care, Other (non HMO) | Attending: Internal Medicine

## 2020-02-14 DIAGNOSIS — Z23 Encounter for immunization: Secondary | ICD-10-CM

## 2020-02-14 NOTE — Progress Notes (Signed)
   Covid-19 Vaccination Clinic  Name:  EDYNN KINLOCH    MRN: [id number] DOB: [birth year]  02/14/2020  Ms. Macmahon was observed post Covid-19 immunization for 15 minutes without incident. She was provided with Vaccine Information Sheet and instruction to access the V-Safe system.   Ms. Stokley was instructed to call 911 with any severe reactions post vaccine: [name] Difficulty breathing  . Swelling of face and throat  . A fast heartbeat  . A bad rash all over body  . Dizziness and weakness

## 2020-02-17 ENCOUNTER — Encounter: Payer: Self-pay | Admitting: Family Medicine

## 2020-02-18 NOTE — Progress Notes (Signed)
Addendum: Reviewed and agree with assessment and management plan. Latalia Etzler M, MD  

## 2020-02-21 MED ORDER — LINACLOTIDE 145 MCG PO CAPS: 145.0000 ug | ORAL_CAPSULE | Freq: Every day | ORAL | 3 refills | Status: DC

## 2020-02-21 NOTE — Telephone Encounter (Signed)
Ok, amitiza ineffective and causing low BP MiraLax ineffective Insurance required trial of other meds, despite the fact Linzess worked well Let's go back to [employer] 145 mcg daily and inform her insurer why other products have not worked Administrator, Civil Service

## 2020-02-24 DIAGNOSIS — M85859 Other specified disorders of bone density and structure, unspecified thigh: Secondary | ICD-10-CM

## 2020-02-24 HISTORY — DX: Other specified disorders of bone density and structure, unspecified thigh: M85.859

## 2020-02-24 NOTE — Telephone Encounter (Signed)
Ceasar [place]: [id number] - PA Case ID: [id number] Need help? Call us at [phone] Outcome Approvedon March 13 CaseId:[id number];Status:Approved;Review Type:Prior Auth;Coverage Start Date:02/21/2020;Coverage End Date:02/21/2021; Drug Linzess 145MCG capsules Form [employer] Electronic PA Form [phone] NCPDP)

## 2020-03-04 ENCOUNTER — Other Ambulatory Visit: Payer: Self-pay

## 2020-03-04 ENCOUNTER — Encounter: Payer: Managed Care, Other (non HMO) | Admitting: Family Medicine

## 2020-03-05 ENCOUNTER — Ambulatory Visit (INDEPENDENT_AMBULATORY_CARE_PROVIDER_SITE_OTHER): Payer: Managed Care, Other (non HMO) | Admitting: Family Medicine

## 2020-03-05 ENCOUNTER — Encounter: Payer: Self-pay | Admitting: Family Medicine

## 2020-03-05 VITALS — BP 100/60 | HR 72 | Temp 97.3°F | Ht 71.0 in | Wt 179.4 lb

## 2020-03-05 DIAGNOSIS — A609 Anogenital herpesviral infection, unspecified: Secondary | ICD-10-CM | POA: Diagnosis not present

## 2020-03-05 DIAGNOSIS — M8580 Other specified disorders of bone density and structure, unspecified site: Secondary | ICD-10-CM

## 2020-03-05 DIAGNOSIS — I83812 Varicose veins of left lower extremities with pain: Secondary | ICD-10-CM

## 2020-03-05 DIAGNOSIS — B351 Tinea unguium: Secondary | ICD-10-CM

## 2020-03-05 LAB — TSH: TSH: 2.5 u[IU]/mL (ref 0.35–4.50)

## 2020-03-05 LAB — VITAMIN D 25 HYDROXY (VIT D DEFICIENCY, FRACTURES): VITD: 59.91 ng/mL (ref 30.00–100.00)

## 2020-03-05 MED ORDER — VALACYCLOVIR HCL 500 MG PO TABS: 500.0000 mg | ORAL_TABLET | Freq: Every day | ORAL | 3 refills | Status: DC

## 2020-03-05 MED ORDER — TERBINAFINE HCL 250 MG PO TABS: 250.0000 mg | ORAL_TABLET | Freq: Every day | ORAL | 0 refills | Status: DC

## 2020-03-05 NOTE — Patient Instructions (Signed)
Calcium 1200mg  daily Vit D 2000IU daily

## 2020-03-05 NOTE — Progress Notes (Signed)
[name] is a 59 y.o. female  Chief Complaint  Patient presents with  . Establish Care    Medication discussion.  Pt c/o pain in lt calf, x9 months.  Pt would like a referral.    HPI: [name] is a 59 y.o. female here for a TOC appt, previous PCP Dr. Deborra Medina.  1. Recent dexa done with GYN showed osteopenia. Will scan copy of report into Epic today. She takes calcium w/ vit D and additional 500mg  Vit D.  2. She stopped prozac 20mg  about 3 weeks ago. She took every other day, then every 3 days, and then stopped. She was on it prior to divorce 4 years ago and is in a "better place now and doesn't need it." 3. She needs refill of valacyclovir which she takes 500mg  daily to reduce risk of transmission of HSV to current husband.  4. She follows with Dr. Maylon Peppers for lymphocytosis and has f/u in 04/2020. 5. She notes toe nail fungus on Rt great toe. Would like to have this treated.  6. Pt is requesting referral to vascular. She notes pain in Lt calf x 9 mo. She had a varicose vein in that area x years. No swelling, redness.    Past Medical History:  Diagnosis Date  . Allergic rhinitis   . Allergy    dust  . Anxiety   . Back pain   . Contact lens/glasses fitting   . Depression   . Endometriosis   . Fatigue   . GERD (gastroesophageal reflux disease)   . Hiatal hernia   . History of Ostium Secundum Atrial Septal Defect 1989   Status post repair in 1989/1990  . Hx of migraines   . IBS (irritable bowel syndrome)   . Incontinence   . Intestinal volvulus [facility]) May 2007  . Loss of appetite   . Onychomycosis   . Osteopenia of femoral neck 02/24/2020  . Sleep apnea    uses mouthguard  . Syncope 11/07/2019  . Varicose veins     Past Surgical History:  Procedure Laterality Date  . ABDOMINAL HYSTERECTOMY  2004  . ASD REPAIR, SECUNDUM  1990   Dr. Servando Snare  . BALLOON SINUPLASTY Left   . COLONOSCOPY    . EYE SURGERY     muscle reattachment 02/2017  . hysterectomy for  severe endometriosis    . NASAL SEPTUM SURGERY     Dr. Georgia Lopes  . OPEN REDUCTION INTERNAL FIXATION (ORIF) DISTAL RADIAL FRACTURE Right 06/20/2017   Procedure: OPEN REDUCTION INTERNAL FIXATION (ORIF) DISTAL RADIAL FRACTURE;  Surgeon: Leanora Cover, MD;  Location: [facility];  Service: Orthopedics;  Laterality: Right;  . right ankle surgery  2010  . right leg fracture with surgery     done with ankle surgery  . TRANSTHORACIC ECHOCARDIOGRAM   January 2012   Normal LV size and function, EF greater than 55%. Mild LA dilation. No intra-atrial shunt with bubble study. Normal pulmonary pressures. Only trace to mild mitral and tricuspid regurgitation.  . TRANSTHORACIC ECHOCARDIOGRAM  11/07/2019   EF 60 to 55%.  LVH.  GR 1 DD, with moderate LA dilation.  Relatively normal valves.  RA pressure estimated 8 mmHg.  [name] UPPER GASTROINTESTINAL ENDOSCOPY    . varicose laser vein surgery  2009    Social History   Socioeconomic History  . Marital status: Married    Spouse name: Richard  . Number of children: Not on file  . Years of education: Not  on file  . Highest education level: Not on file  Occupational History  . Occupation: remodel houses    Employer: [employer]  Tobacco Use  . Smoking status: Former Smoker    Packs/day: 1.00    Years: 10.00    Pack years: 10.00    Types: Cigarettes    Quit date: 12/12/1988    Years since quitting: 31.2  . Smokeless tobacco: Never Used  Substance and Sexual Activity  . Alcohol use: Yes    Comment: social use/wine rare  . Drug use: No  . Sexual activity: Not on file  Other Topics Concern  . Not on file  Social History Narrative   They have 2 children and at least two grandchildren. She does all her activities of daily living. She works as a Midwife for [employer].     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 She does other  days of the gym and she is able to at least 60 minutes a day 5 days a week.   She is a former smoker who quit in 1990. This was after smoking a pack a day for 10 years. She drinks social alcohol on occasion.   Social Determinants of Health   Financial Resource Strain:   . Difficulty of Paying Living Expenses:   Food Insecurity:   . Worried About Charity fundraiser in the Last Year:   . Arboriculturist in the Last Year:   Transportation Needs:   . Film/video editor (Medical):   [name] Lack of Transportation (Non-Medical):   Physical Activity:   . Days of Exercise per Week:   . Minutes of Exercise per Session:   Stress:   . Feeling of Stress :   Social Connections:   . Frequency of Communication with Friends and Family:   . Frequency of Social Gatherings with Friends and Family:   . Attends Religious Services:   . Active Member of Clubs or Organizations:   . Attends Archivist Meetings:   [name] Marital Status:   Intimate Partner Violence:   . Fear of Current or Ex-Partner:   . Emotionally Abused:   [name] Physically Abused:   . Sexually Abused:     Family History  Problem Relation Age of Onset  . Diabetes Mother   . Colon cancer Paternal Grandfather   . Heart disease Maternal Grandfather   . Prostate cancer Maternal Grandfather   . Lung cancer Maternal Grandmother        with mets to the brain  . Clotting disorder Maternal Grandmother   . Clotting disorder Sister   . Heart disease Sister   . Colon polyps Neg Hx   . Esophageal cancer Neg Hx   . Rectal cancer Neg Hx   . Stomach cancer Neg Hx      Immunization History  Administered Date(s) Administered  . Influenza Split 10/25/2011, 10/25/2012  . Influenza Whole 09/17/2008, 10/08/2009, 11/11/2010  . Influenza,inj,Quad PF,6+ Mos 11/18/2013, 09/13/2017, 09/12/2018, 08/27/2019  . Influenza-Unspecified 09/11/2014, 11/01/2015, 11/01/2015  . PFIZER SARS-COV-2 Vaccination 02/14/2020  . Tdap 01/16/2012  . Zoster  Recombinat (Shingrix) 03/17/2017, 09/13/2017    Outpatient Encounter Medications as of 03/05/2020  Medication Sig Note  . acetaminophen (TYLENOL) 500 MG tablet Take 1,000 mg by mouth every 6 (six) hours as needed for mild pain.   [name] albuterol (VENTOLIN  HFA) 108 (90 Base) MCG/ACT inhaler albuterol sulfate HFA 90 mcg/actuation aerosol inhaler  INL 1 TO 2 PFS PO Q 4 TO 6 H PRF COUGH OR WHZ   . Ascorbic Acid (VITA-C PO) Take by mouth.   . Azelastine HCl 137 MCG/SPRAY SOLN Place 1 spray into both nostrils daily.    [name] b complex vitamins capsule Take 1 capsule by mouth daily.   . Calcium Carb-Cholecalciferol (CALTRATE 600+D) 600-800 MG-UNIT TABS Take 1 tablet by mouth 2 (two) times daily.   . cycloSPORINE (RESTASIS) 0.05 % ophthalmic emulsion Place 1 drop into both eyes 2 (two) times daily.    [name] EPIPEN 2-PAK 0.3 MG/0.3ML SOAJ injection  05/15/2014: Received from: External Pharmacy  . fluticasone (FLONASE) 50 MCG/ACT nasal spray instill 2 sprays into each nostril once daily (Patient taking differently: Place 2 sprays into both nostrils daily. )   . levocetirizine (XYZAL) 5 MG tablet Take 5 mg by mouth every evening.  05/15/2014: Received from: External Pharmacy  . linaclotide (LINZESS) 145 MCG CAPS capsule Take 1 capsule (145 mcg total) by mouth daily before breakfast. Pharmacy- d/c amitiza. ineffective   . magnesium oxide (MAG-OX) 400 MG tablet Take 400 mg by mouth daily.   . montelukast (SINGULAIR) 10 MG tablet Take 10 mg by mouth at bedtime.  05/15/2014: Received from: External Pharmacy  . Multiple Vitamin (MULTIVITAMINS PO) Take 1 tablet by mouth daily.     [name] omeprazole (PRILOSEC) 20 MG capsule Take 1 capsule (20 mg total) by mouth 2 (two) times daily before a meal.   . polyethylene glycol (MIRALAX / GLYCOLAX) packet Take 17 g by mouth daily.   . valACYclovir (VALTREX) 500 MG tablet Take 1 tablet (500 mg total) by mouth daily.   . [DISCONTINUED] valACYclovir (VALTREX) 500 MG tablet Take 1 tablet (500 mg  total) by mouth daily.   [name] terbinafine (LAMISIL) 250 MG tablet Take 1 tablet (250 mg total) by mouth daily.   . [DISCONTINUED] FLUoxetine (PROZAC) 20 MG capsule TAKE 1 CAPSULE BY MOUTH EVERY DAY (Patient taking differently: Take 20 mg by mouth daily. )    No facility-administered encounter medications on file as of 03/05/2020.     ROS: Pertinent positives and negatives noted in HPI. Remainder of ROS non-contributory    Allergies  Allergen Reactions  . Chlordiazepoxide-Clidinium     "loopy"  . Codeine     REACTION: nausea,vomitting and dizziness  . Penicillins     REACTION: hives ---taken as a child    BP 100/60 (BP Location: Left Arm, Patient Position: Sitting, Cuff Size: Normal)   Pulse 72   Temp (!) 97.3 F (36.3 C) (Temporal)   Ht 5\' 11"  (1.803 m)   Wt 179 lb 6.4 oz (81.4 kg)   SpO2 92%   BMI 25.02 kg/m   Physical Exam  Constitutional: She is oriented to person, place, and time. She appears well-developed and well-nourished. No distress.  Pulmonary/Chest: No respiratory distress.  Musculoskeletal:        General: Tenderness and edema present.     Left lower leg: No swelling or tenderness. No edema.       Legs:  Neurological: She is alert and oriented to person, place, and time.  Skin:  Rt great toenail thickened and discolored  Psychiatric: She has a normal mood and affect. Her behavior is normal.     A/P:  1. HSV (herpes simplex virus) anogenital infection Refill: - valACYclovir (VALTREX) 500 MG tablet; Take 1 tablet (500 mg total)  by mouth daily.  Dispense: 90 tablet; Refill: 3  2. Osteopenia, unspecified location - pt states GYN who ordered Dexa recommended she get Vit D and TSH level checked. Normal TSH in 10/2019, 06/2019, annually prior to that all WNL but pt would like this checked - VITAMIN D 25 Hydroxy (Vit-D Deficiency, Fractures) - TSH - recommend calcium 1200mg  and Vit D 1000-2000IU daily  3. Onychomycosis Rx: - terbinafine (LAMISIL) 250 MG  tablet; Take 1 tablet (250 mg total) by mouth daily.  Dispense: 90 tablet; Refill: 0 - normal LFT's in 10/2019 - pt and I agreed no need to repeat at this time - f/u in 3 mo   4. Varicose veins of left lower extremity with pain - Ambulatory referral to Vascular Surgery   This visit occurred during the SARS-CoV-2 public health emergency.  Safety protocols were in place, including screening questions prior to the visit, additional usage of staff PPE, and extensive cleaning of exam room while observing appropriate contact time as indicated for disinfecting solutions.

## 2020-03-07 ENCOUNTER — Ambulatory Visit: Payer: Managed Care, Other (non HMO) | Attending: Internal Medicine

## 2020-03-07 DIAGNOSIS — Z23 Encounter for immunization: Secondary | ICD-10-CM

## 2020-03-07 NOTE — Progress Notes (Signed)
   Covid-19 Vaccination Clinic  Name:  [name]    MRN: [id number] DOB: 03-15-1961  03/07/2020  [name] was observed post Covid-19 immunization for 15 minutes without incident. She was provided with Vaccine Information Sheet and instruction to access the V-Safe system.   [name] was instructed to call 911 with any severe reactions post vaccine: [name] Difficulty breathing  . Swelling of face and throat  . A fast heartbeat  . A bad rash all over body  . Dizziness and weakness   Immunizations Administered    Name Date Dose VIS Date Route   Pfizer COVID-19 Vaccine 03/07/2020 11:32 AM 0.3 mL 11/22/2019 Intramuscular   Manufacturer: [place]   Lot: [id number]   [place]: [id number]

## 2020-03-17 ENCOUNTER — Ambulatory Visit: Payer: Managed Care, Other (non HMO)

## 2020-04-08 ENCOUNTER — Encounter: Payer: Self-pay | Admitting: Family Medicine

## 2020-04-08 NOTE — Telephone Encounter (Signed)
I called [facility] and they do not need [place] to obtain auth. Pt has [employer] Open Access and technically a referral is not even required. [facility] states that if they perform a procedure they will review with the pt first and obtain auth if needed at that time.   I called and explained to pt and she said Carla at [facility] told her differently. Pt is understanding she does not need auth for the initial consultation.

## 2020-04-23 ENCOUNTER — Encounter: Payer: Self-pay | Admitting: Hematology

## 2020-04-23 ENCOUNTER — Ambulatory Visit: Payer: Managed Care, Other (non HMO) | Admitting: Hematology

## 2020-04-23 ENCOUNTER — Other Ambulatory Visit: Payer: Managed Care, Other (non HMO)

## 2020-04-23 ENCOUNTER — Inpatient Hospital Stay: Payer: Managed Care, Other (non HMO) | Attending: Hematology

## 2020-04-23 ENCOUNTER — Telehealth: Payer: Self-pay | Admitting: Hematology

## 2020-04-23 ENCOUNTER — Inpatient Hospital Stay (HOSPITAL_BASED_OUTPATIENT_CLINIC_OR_DEPARTMENT_OTHER): Payer: Managed Care, Other (non HMO) | Admitting: Hematology

## 2020-04-23 ENCOUNTER — Other Ambulatory Visit: Payer: Self-pay

## 2020-04-23 VITALS — BP 93/67 | HR 61 | Temp 97.1°F | Resp 18 | Ht 71.0 in | Wt 179.0 lb

## 2020-04-23 DIAGNOSIS — D72820 Lymphocytosis (symptomatic): Secondary | ICD-10-CM

## 2020-04-23 DIAGNOSIS — K319 Disease of stomach and duodenum, unspecified: Secondary | ICD-10-CM | POA: Insufficient documentation

## 2020-04-23 LAB — CMP (CANCER CENTER ONLY)
ALT: 21 U/L (ref 0–44)
AST: 21 U/L (ref 15–41)
Albumin: 4.5 g/dL (ref 3.5–5.0)
Alkaline Phosphatase: 59 U/L (ref 38–126)
Anion gap: 7 (ref 5–15)
BUN: 19 mg/dL (ref 6–20)
CO2: 31 mmol/L (ref 22–32)
Calcium: 9.8 mg/dL (ref 8.9–10.3)
Chloride: 103 mmol/L (ref 98–111)
Creatinine: 0.81 mg/dL (ref 0.44–1.00)
GFR, Est AFR Am: >60 mL/min (ref >60)
GFR, Estimated: >60 mL/min (ref >60)
Glucose, Bld: 119 mg/dL — ABNORMAL HIGH (ref 70–99)
Potassium: 3.9 mmol/L (ref 3.5–5.1)
Sodium: 141 mmol/L (ref 135–145)
Total Bilirubin: 0.5 mg/dL (ref 0.3–1.2)
Total Protein: 6.6 g/dL (ref 6.5–8.1)

## 2020-04-23 LAB — CBC WITH DIFFERENTIAL (CANCER CENTER ONLY)
Abs Immature Granulocytes: 0.01 10*3/uL (ref 0.00–0.07)
Basophils Absolute: 0.1 10*3/uL (ref 0.0–0.1)
Basophils Relative: 1 %
Eosinophils Absolute: 0.2 10*3/uL (ref 0.0–0.5)
Eosinophils Relative: 2 %
HCT: 43.4 % (ref 36.0–46.0)
Hemoglobin: 14.3 g/dL (ref 12.0–15.0)
Immature Granulocytes: 0 %
Lymphocytes Relative: 57 %
Lymphs Abs: 5.5 10*3/uL — ABNORMAL HIGH (ref 0.7–4.0)
MCH: 31 pg (ref 26.0–34.0)
MCHC: 32.9 g/dL (ref 30.0–36.0)
MCV: 94.1 fL (ref 80.0–100.0)
Monocytes Absolute: 0.7 10*3/uL (ref 0.1–1.0)
Monocytes Relative: 7 %
Neutro Abs: 3.2 10*3/uL (ref 1.7–7.7)
Neutrophils Relative %: 33 %
Platelet Count: 287 10*3/uL (ref 150–400)
RBC: 4.61 MIL/uL (ref 3.87–5.11)
RDW: 13.1 % (ref 11.5–15.5)
WBC Count: 9.8 10*3/uL (ref 4.0–10.5)
nRBC: 0 % (ref 0.0–0.2)

## 2020-04-23 LAB — SAVE SMEAR(SSMR), FOR PROVIDER SLIDE REVIEW

## 2020-04-23 LAB — LACTATE DEHYDROGENASE: LDH: 173 U/L (ref 98–192)

## 2020-04-23 NOTE — Telephone Encounter (Signed)
Per 5/13 los Return as needed

## 2020-04-23 NOTE — Progress Notes (Signed)
[place] OFFICE PROGRESS NOTE  Patient Care Team: Ronnald Nian, DO as PCP - General (Family Medicine) Pyrtle, Lajuan Lines, MD as Consulting Physician (Gastroenterology) Mosetta Anis, MD as Referring Physician (Allergy) Leonie Man, MD as Consulting Physician (Cardiology)  HEME/ONC OVERVIEW: 1. Monoclonal B-cell lymphocytosis  -2020: new lymphocyte predominance on diff (50-60%); CBC normal  CBC normal  PB flow cytometry showed 56% kappa-restricted monoclonal B-cell population, CD5+, CD20+; absolute monoclonal B-cell count < 5000  PB FISH positive for WFU(93A35), favorable; no p53 or ATM deletion; IgHV mutated   No lymphadenopathy or hepatosplenomegaly on CT CAP  -On observation   ASSESSMENT & PLAN:   Monoclonal B-cell lymphocytosis -Currently on observation  -Clinically, patient denies any constitutional symptoms -I reviewed with the patient in detail the diagnosis and pathophysiology of monoclonal B-cell lymphocytosis.  The main distinction between MBL and CLL is that the absolute monoclonal B-lymphocytes is less than 5,000 in MBL vs greater than 5,000 in CLL (determined via flow cytometry).   -Given the low lymphocyte count, favorable cytogenetics/molecular studies and absence of clinical symptoms, the likelihood of progressing to CLL is low, and there is no indication for treatment at this time.  -I would recommend CBC w/ diff q6-45monthto monitor for possible progression to CLL or other lymphoproliferative disorder  -I counseled the patient on some of the concerning symptoms, including but not limited to, unexplained persistent fever, drenching night sweats, unexplained weight loss, or progressive lymphadenopathy, for which she is instructed to seek further evaluation -Routine imaging studies are not indicated unless there are clinically suspicious symptoms  Questionable gastric wall thickening -Noted incidentally on recent CT 10/2019 -EGD in 11/2019  showed mild erythematous mucosa in the gastric fundus and body; reactive gastropathy on bx, no H. Pylori infection -Colonoscopy normal -Continue follow-up with gastroenterology  No orders of the defined types were placed in this encounter.  The total time spent in the encounter was 35 minutes, including face-to-face time with the patient, review of various tests results, order additional studies/medications, documentation, and coordination of care plan.   All questions were answered. The patient knows to call the clinic with any problems, questions or concerns. No barriers to learning was detected.  Return as needed.  Patient prefers to continue monitoring with PCP.  Hematology is available as needed.   YTish Men MD 5/13/202112:54 PM  CHIEF COMPLAINT: "I am doing fine"  INTERVAL HISTORY: Ms. GQuirionreturns to clinic for follow-up of monoclonal B-cell lymphocytosis on observation.  Patient reports that just before Thanksgiving last year, she had a fainting spell, for which she was admitted overnight for observation.  Her work-up was reportedly unremarkable.  She has been doing well since then, and denies any constitutional symptoms, such as unexplained fever, night sweats, weight loss, or lymphadenopathy.  She feels well today, and denies any complaints.  REVIEW OF SYSTEMS:   Constitutional: ( - ) fevers, ( - )  chills , ( - ) night sweats Eyes: ( - ) blurriness of vision, ( - ) double vision, ( - ) watery eyes Ears, nose, mouth, throat, and face: ( - ) mucositis, ( - ) sore throat Respiratory: ( - ) cough, ( - ) dyspnea, ( - ) wheezes Cardiovascular: ( - ) palpitation, ( - ) chest discomfort, ( - ) lower extremity swelling Gastrointestinal:  ( - ) nausea, ( - ) heartburn, ( - ) change in bowel habits Skin: ( - ) abnormal skin rashes Lymphatics: ( - )  new lymphadenopathy, ( - ) easy bruising Neurological: ( - ) numbness, ( - ) tingling, ( - ) new weaknesses Behavioral/Psych: ( - ) mood  change, ( - ) new changes  All other systems were reviewed with the patient and are negative.  SUMMARY OF ONCOLOGIC HISTORY: Oncology History   No history exists.    I have reviewed the past medical history, past surgical history, social history and family history with the patient and they are unchanged from previous note.  ALLERGIES:  is allergic to chlordiazepoxide-clidinium; codeine; and penicillins.  MEDICATIONS:  Current Outpatient Medications  Medication Sig Dispense Refill  . acetaminophen (TYLENOL) 500 MG tablet Take 1,000 mg by mouth every 6 (six) hours as needed for mild pain.    [name] albuterol (VENTOLIN HFA) 108 (90 Base) MCG/ACT inhaler albuterol sulfate HFA 90 mcg/actuation aerosol inhaler  INL 1 TO 2 PFS PO Q 4 TO 6 H PRF COUGH OR WHZ    . Ascorbic Acid (VITA-C PO) Take by mouth.    . Azelastine HCl 137 MCG/SPRAY SOLN Place 1 spray into both nostrils daily.     [name] b complex vitamins capsule Take 1 capsule by mouth daily.    . Calcium Carb-Cholecalciferol (CALTRATE 600+D) 600-800 MG-UNIT TABS Take 1 tablet by mouth 2 (two) times daily.    . cycloSPORINE (RESTASIS) 0.05 % ophthalmic emulsion Place 1 drop into both eyes 2 (two) times daily.     [name] EPIPEN 2-PAK 0.3 MG/0.3ML SOAJ injection     . fluticasone (FLONASE) 50 MCG/ACT nasal spray instill 2 sprays into each nostril once daily (Patient taking differently: Place 2 sprays into both nostrils daily. ) 16 g 1  . levocetirizine (XYZAL) 5 MG tablet Take 5 mg by mouth every evening.     . linaclotide (LINZESS) 145 MCG CAPS capsule Take 1 capsule (145 mcg total) by mouth daily before breakfast. Pharmacy- d/c amitiza. ineffective 30 capsule 3  . magnesium oxide (MAG-OX) 400 MG tablet Take 400 mg by mouth daily.    . montelukast (SINGULAIR) 10 MG tablet Take 10 mg by mouth at bedtime.     . Multiple Vitamin (MULTIVITAMINS PO) Take 1 tablet by mouth daily.      [name] omeprazole (PRILOSEC) 20 MG capsule Take 1 capsule (20 mg total) by mouth  2 (two) times daily before a meal. 60 capsule 11  . polyethylene glycol (MIRALAX / GLYCOLAX) packet Take 17 g by mouth daily.    [name] terbinafine (LAMISIL) 250 MG tablet Take 1 tablet (250 mg total) by mouth daily. 90 tablet 0  . valACYclovir (VALTREX) 500 MG tablet Take 1 tablet (500 mg total) by mouth daily. 90 tablet 3   No current facility-administered medications for this visit.    PHYSICAL EXAMINATION: ECOG PERFORMANCE STATUS: 0 - Asymptomatic  Today's Vitals   04/23/20 1235  BP: 93/67  Pulse: 61  Resp: 18  Temp: (!) 97.1 F (36.2 C)  TempSrc: Temporal  SpO2: 100%  Weight: 179 lb (81.2 kg)  Height: 5' 11"  (1.803 m)  PainSc: 0-No pain   Body mass index is 24.97 kg/m.  Filed Weights   04/23/20 1235  Weight: 179 lb (81.2 kg)    GENERAL: alert, no distress and comfortable SKIN: skin color, texture, turgor are normal, no rashes or significant lesions EYES: conjunctiva are pink and non-injected, sclera clear NECK: supple LYMPH:  no palpable lymphadenopathy in the cervical LUNGS: clear to auscultation with normal breathing effort HEART: regular rate & rhythm and no  murmurs and no lower extremity edema ABDOMEN: soft, non-tender, non-distended, normal bowel sounds Musculoskeletal: no cyanosis of digits and no clubbing  PSYCH: alert & oriented x 3, fluent speech  LABORATORY DATA:  I have reviewed the data as listed    Component Value Date/Time   NA 141 04/23/2020 1149   K 3.9 04/23/2020 1149   CL 103 04/23/2020 1149   CO2 31 04/23/2020 1149   GLUCOSE 119 (H) 04/23/2020 1149   BUN 19 04/23/2020 1149   CREATININE 0.81 04/23/2020 1149   CALCIUM 9.8 04/23/2020 1149   PROT 6.6 04/23/2020 1149   ALBUMIN 4.5 04/23/2020 1149   AST 21 04/23/2020 1149   ALT 21 04/23/2020 1149   ALKPHOS 59 04/23/2020 1149   BILITOT 0.5 04/23/2020 1149   GFRNONAA >60 04/23/2020 1149   GFRAA >60 04/23/2020 1149    No results found for: SPEP, UPEP  Lab Results  Component Value Date    WBC 9.8 04/23/2020   NEUTROABS 3.2 04/23/2020   HGB 14.3 04/23/2020   HCT 43.4 04/23/2020   MCV 94.1 04/23/2020   PLT 287 04/23/2020      Chemistry      Component Value Date/Time   NA 141 04/23/2020 1149   K 3.9 04/23/2020 1149   CL 103 04/23/2020 1149   CO2 31 04/23/2020 1149   BUN 19 04/23/2020 1149   CREATININE 0.81 04/23/2020 1149      Component Value Date/Time   CALCIUM 9.8 04/23/2020 1149   ALKPHOS 59 04/23/2020 1149   AST 21 04/23/2020 1149   ALT 21 04/23/2020 1149   BILITOT 0.5 04/23/2020 1149       RADIOGRAPHIC STUDIES: I have personally reviewed the radiological images as listed below and agreed with the findings in the report. No results found.

## 2020-04-24 LAB — SURGICAL PATHOLOGY

## 2020-04-27 LAB — FLOW CYTOMETRY

## 2020-04-28 ENCOUNTER — Other Ambulatory Visit: Payer: Self-pay | Admitting: *Deleted

## 2020-04-28 DIAGNOSIS — I8390 Asymptomatic varicose veins of unspecified lower extremity: Secondary | ICD-10-CM

## 2020-04-29 ENCOUNTER — Telehealth (HOSPITAL_COMMUNITY): Payer: Self-pay

## 2020-04-29 NOTE — Telephone Encounter (Signed)

## 2020-04-30 ENCOUNTER — Encounter: Payer: Self-pay | Admitting: Vascular Surgery

## 2020-04-30 ENCOUNTER — Other Ambulatory Visit: Payer: Self-pay

## 2020-04-30 ENCOUNTER — Ambulatory Visit: Payer: Managed Care, Other (non HMO) | Admitting: Vascular Surgery

## 2020-04-30 ENCOUNTER — Ambulatory Visit (HOSPITAL_COMMUNITY)
Admission: RE | Admit: 2020-04-30 | Discharge: 2020-04-30 | Disposition: A | Discharge status: Home / Self Care | Payer: Managed Care, Other (non HMO) | Source: Ambulatory Visit | Attending: Surgery | Admitting: Surgery

## 2020-04-30 VITALS — BP 98/63 | HR 71 | Temp 97.5°F | Resp 16 | Ht 71.0 in | Wt 176.0 lb

## 2020-04-30 DIAGNOSIS — I83812 Varicose veins of left lower extremities with pain: Secondary | ICD-10-CM | POA: Diagnosis not present

## 2020-04-30 DIAGNOSIS — I8390 Asymptomatic varicose veins of unspecified lower extremity: Secondary | ICD-10-CM

## 2020-04-30 NOTE — Progress Notes (Signed)
REASON FOR VISIT:   Painful varicose veins of the left lower extremity.  The consult is requested by Dr. Bryan Lemma.   MEDICAL ISSUES:   PAINFUL VARICOSE VEINS OF THE LEFT LOWER EXTREMITY: This patient has painful varicose veins of the left lower extremity.  She has superficial venous reflux involving the left small saphenous vein only.  We have discussed the importance of intermittent leg elevation and proper positioning for this.  I have recommended that she wear thigh-high compression stockings with a gradient of 20 to 30 mmHg and we will get her fitted for these.  I have encouraged her to avoid prolonged sitting and standing.  We have discussed the importance of exercise specifically walking and water aerobics.  I will plan on seeing her back in 3 months.  If she is continuing to have symptoms I think she would be a good candidate for laser ablation of the left small saphenous vein with 10-20 stabs.  Of note she appears to have a vein of [place]chel [place] and we can likely perform laser ablation of the small saphenous vein and the extension into the posterior thigh.  There does not appear to be any communication to the deep system.   HPI:   [name] is a pleasant 59 y.o. female who presents with painful varicose veins of the left lower extremity.  She has had the symptoms for many years.  She describes aching pain and heaviness in her left leg which has been worse over the last year.  Her symptoms are relieved somewhat with elevation.  She has not been wearing compression stockings.  She takes Tylenol as needed for pain.  She did have previous laser ablation of the right great saphenous vein over 10 years ago with Dr. Sherren Mocha Early.  She denies any previous history of DVT.  Her symptoms are worse at the end of the day.  She is otherwise healthy.  Past Medical History:  Diagnosis Date  . Allergic rhinitis   . Allergy    dust  . Anxiety   . Back pain   . Contact lens/glasses fitting   .  Depression   . Endometriosis   . Fatigue   . GERD (gastroesophageal reflux disease)   . Hiatal hernia   . History of Ostium Secundum Atrial Septal Defect 1989   Status post repair in 1989/1990  . Hx of migraines   . IBS (irritable bowel syndrome)   . Incontinence   . Intestinal volvulus [facility]) May 2007  . Loss of appetite   . Onychomycosis   . Osteopenia of femoral neck 02/24/2020  . Sleep apnea    uses mouthguard  . Syncope 11/07/2019  . Varicose veins     Family History  Problem Relation Age of Onset  . Diabetes Mother   . Colon cancer Paternal Grandfather   . Heart disease Maternal Grandfather   . Prostate cancer Maternal Grandfather   . Lung cancer Maternal Grandmother        with mets to the brain  . Clotting disorder Maternal Grandmother   . Clotting disorder Sister   . Heart disease Sister   . Colon polyps Neg Hx   . Esophageal cancer Neg Hx   . Rectal cancer Neg Hx   . Stomach cancer Neg Hx     SOCIAL HISTORY: Social History   Tobacco Use  . Smoking status: Former Smoker    Packs/day: 1.00    Years: 10.00    Pack years:  10.00    Types: Cigarettes    Quit date: 12/12/1988    Years since quitting: 31.4  . Smokeless tobacco: Never Used  Substance Use Topics  . Alcohol use: Yes    Comment: social use/wine rare    Allergies  Allergen Reactions  . Chlordiazepoxide-Clidinium Other (See Comments)    "loopy"  . Codeine Nausea And Vomiting    Dizziness  . Penicillins Hives    Current Outpatient Medications  Medication Sig Dispense Refill  . acetaminophen (TYLENOL) 500 MG tablet Take 1,000 mg by mouth every 6 (six) hours as needed for mild pain.    [name] albuterol (VENTOLIN HFA) 108 (90 Base) MCG/ACT inhaler albuterol sulfate HFA 90 mcg/actuation aerosol inhaler  INL 1 TO 2 PFS PO Q 4 TO 6 H PRF COUGH OR WHZ    . Ascorbic Acid (VITA-C PO) Take by mouth.    . Azelastine HCl 137 MCG/SPRAY SOLN Place 1 spray into both nostrils daily.     [name] b complex vitamins  capsule Take 1 capsule by mouth daily.    . Calcium Carb-Cholecalciferol (CALTRATE 600+D) 600-800 MG-UNIT TABS Take 1 tablet by mouth 2 (two) times daily.    . cycloSPORINE (RESTASIS) 0.05 % ophthalmic emulsion Place 1 drop into both eyes 2 (two) times daily.     [name] EPIPEN 2-PAK 0.3 MG/0.3ML SOAJ injection     . fluticasone (FLONASE) 50 MCG/ACT nasal spray instill 2 sprays into each nostril once daily (Patient taking differently: Place 2 sprays into both nostrils daily. ) 16 g 1  . levocetirizine (XYZAL) 5 MG tablet Take 5 mg by mouth every evening.     . linaclotide (LINZESS) 145 MCG CAPS capsule Take 1 capsule (145 mcg total) by mouth daily before breakfast. Pharmacy- d/c amitiza. ineffective 30 capsule 3  . magnesium oxide (MAG-OX) 400 MG tablet Take 400 mg by mouth daily.    . montelukast (SINGULAIR) 10 MG tablet Take 10 mg by mouth at bedtime.     . Multiple Vitamin (MULTIVITAMINS PO) Take 1 tablet by mouth daily.      [name] omeprazole (PRILOSEC) 20 MG capsule Take 1 capsule (20 mg total) by mouth 2 (two) times daily before a meal. 60 capsule 11  . polyethylene glycol (MIRALAX / GLYCOLAX) packet Take 17 g by mouth daily.    [name] terbinafine (LAMISIL) 250 MG tablet Take 1 tablet (250 mg total) by mouth daily. 90 tablet 0  . valACYclovir (VALTREX) 500 MG tablet Take 1 tablet (500 mg total) by mouth daily. 90 tablet 3   No current facility-administered medications for this visit.    REVIEW OF SYSTEMS:  [X]  denotes positive finding, [ ]  denotes negative finding Cardiac  Comments:  Chest pain or chest pressure:    Shortness of breath upon exertion:    Short of breath when lying flat:    Irregular heart rhythm:        Vascular    Pain in calf, thigh, or hip brought on by ambulation:    Pain in feet at night that wakes you up from your sleep:     Blood clot in your veins:    Leg swelling:         Pulmonary    Oxygen at home:    Productive cough:     Wheezing:         Neurologic    Sudden  weakness in arms or legs:     Sudden numbness in arms or legs:  Sudden onset of difficulty speaking or slurred speech:    Temporary loss of vision in one eye:     Problems with dizziness:         Gastrointestinal    Blood in stool:     Vomited blood:         Genitourinary    Burning when urinating:     Blood in urine:        Psychiatric    Major depression:         Hematologic    Bleeding problems:    Problems with blood clotting too easily:        Skin    Rashes or ulcers:        Constitutional    Fever or chills:     PHYSICAL EXAM:   Vitals:   04/30/20 1454  BP: 98/63  Pulse: 71  Resp: 16  Temp: (!) 97.5 F (36.4 C)  TempSrc: Temporal  SpO2: 97%  Weight: 176 lb (79.8 kg)  Height: 5\' 11"  (1.803 m)    GENERAL: The patient is a well-nourished female, in no acute distress. The vital signs are documented above. CARDIAC: There is a regular rate and rhythm.  VASCULAR: I do not detect carotid bruits. Both feet are warm and well perfused. She does have some varicose veins in her posterior left calf.   I did look at her left small saphenous vein myself with the SonoSite and this is dilated.  She appears to have a vein of Gi[place] with the thigh extension not connecting to the deep system. PULMONARY: There is good air exchange bilaterally without wheezing or rales. ABDOMEN: Soft and non-tender with normal pitched bowel sounds.  MUSCULOSKELETAL: There are no major deformities or cyanosis. NEUROLOGIC: No focal weakness or paresthesias are detected. SKIN: There are no ulcers or rashes noted. PSYCHIATRIC: The patient has a normal affect.  DATA:    VENOUS DUPLEX: I have inability interpreted her venous duplex scan of the left lower extremity.  There is no evidence of DVT.  There is no deep venous reflux.  There is superficial venous reflux in the small saphenous vein only.  The vein is dilated with diameters ranging from 0.46-0.58 cm.  There was no reflux in the  left great saphenous vein.  Deitra Mayo [facility] [phone]

## 2020-05-26 DIAGNOSIS — I8393 Asymptomatic varicose veins of bilateral lower extremities: Secondary | ICD-10-CM

## 2020-05-27 ENCOUNTER — Encounter: Payer: Managed Care, Other (non HMO) | Admitting: Family Medicine

## 2020-06-24 ENCOUNTER — Ambulatory Visit (INDEPENDENT_AMBULATORY_CARE_PROVIDER_SITE_OTHER): Payer: Managed Care, Other (non HMO) | Admitting: Cardiology

## 2020-06-24 ENCOUNTER — Encounter: Payer: Self-pay | Admitting: Cardiology

## 2020-06-24 ENCOUNTER — Other Ambulatory Visit: Payer: Self-pay

## 2020-06-24 VITALS — BP 114/62 | HR 48 | Ht 71.0 in | Wt 178.4 lb

## 2020-06-24 DIAGNOSIS — I472 Ventricular tachycardia: Secondary | ICD-10-CM | POA: Diagnosis not present

## 2020-06-24 DIAGNOSIS — Q211 Atrial septal defect: Secondary | ICD-10-CM | POA: Diagnosis not present

## 2020-06-24 DIAGNOSIS — R55 Syncope and collapse: Secondary | ICD-10-CM | POA: Diagnosis not present

## 2020-06-24 DIAGNOSIS — Q2111 Secundum atrial septal defect: Secondary | ICD-10-CM

## 2020-06-24 DIAGNOSIS — I4729 Other ventricular tachycardia: Secondary | ICD-10-CM

## 2020-06-24 NOTE — Progress Notes (Signed)
Primary Care Provider: Ronnald Nian, DO Cardiologist: No primary care provider on file. Electrophysiologist: None  Clinic Note: Chief Complaint  Patient presents with  . Follow-up    Doing well  . Loss of Consciousness    No further episodes    HPI:    [name] is a 59 y.o. female with a PMH notable for history of ASD repair along with IBS, endometriosis and monoclonal B-cell lymphocytosis who presents today for delayed 50-month follow-up after initial evaluation for syncope and bradycardia.  At that time was seen at the request of Arnette Norris, MD. She was a former long-standing patient of Dr. Chase Picket from the 1990s had a history of ASD repair by Dr. Servando Snare in the late 1989. She is also had varicose vein ablation surgery on RLE by Dr. Donnetta Hutching in 2008 Prior to December 2020, I had last seen her in June 2016.  [name] was seen December 2020 in follow-up from ER visit for syncope.  She apparently passed out getting up going to the bathroom.  Was noted to be bradycardic and hypotensive.  Improved with IV fluids. ->  I agree with the diagnosis of micturition/vasovagal syncope.  When I saw her, she was still wearing her 30-day monitor to go along with the echo ordered at the time of her hospitalization.  Recent Hospitalizations: None  Reviewed  CV studies:    The following studies were reviewed today: (if available, images/films reviewed: From Epic Chart or Care Everywhere) . November 29, 2019-Myoview: EF 50 to 55%.  No EKG changes noted.  No evidence of ischemia or infarction.  LOW RISK.  December 2020 - Cardiac Event Monitor:  Predominant rhythm is sinus rhythm ranging from 43bpm 148 bpm with average heart rate 68 bpm.  1 run of 11 beats NONSUSTAINED VENTRICULAR TACHYCARDIA -> most likely benign given nonischemic Myoview.  Interval History:   [name] returns today for follow-up indicating that she has not had any further syncopal episodes.  She  does not have any issues with irregular heartbeats or palpitations.  Nothing to suggest recurrence of any arrhythmia seen on the monitor.  No chest pain or pressure with rest or exertion.  She is very careful about staying adequately hydrated.  She has a heart rate of 48 bpm, but denies any dizziness or wooziness.  No fatigue or exercise intolerance.  CV Review of Symptoms (Summary) Cardiovascular ROS: no chest pain or dyspnea on exertion negative for - edema, irregular heartbeat, orthopnea, palpitations, paroxysmal nocturnal dyspnea, rapid heart rate, shortness of breath or Lightheadedness, dizziness or wooziness, syncope/near syncope or TIA/amaurosis fugax  The patient does not have symptoms concerning for COVID-19 infection (fever, chills, cough, or new shortness of breath).  The patient is practicing social distancing & Masking.  Immunization History  Administered Date(s) Administered  . Influenza Split 10/25/2011, 10/25/2012  . Influenza Whole 09/17/2008, 10/08/2009, 11/11/2010  . Influenza,inj,Quad PF,6+ Mos 11/18/2013, 09/13/2017, 09/12/2018, 08/27/2019  . Influenza-Unspecified 09/11/2014, 11/01/2015, 11/01/2015  . PFIZER SARS-COV-2 Vaccination 02/14/2020, 03/07/2020  . Tdap 01/16/2012  . Zoster Recombinat (Shingrix) 03/17/2017, 09/13/2017     REVIEWED OF SYSTEMS   ROS   I have reviewed and (if needed) personally updated the patient's problem list, medications, allergies, past medical and surgical history, social and family history.   PAST MEDICAL HISTORY   Past Medical History:  Diagnosis Date  . Allergic rhinitis   . Allergy    dust  . Anxiety   . Back pain   .  Contact lens/glasses fitting   . Depression   . Endometriosis   . Fatigue   . GERD (gastroesophageal reflux disease)   . Hiatal hernia   . History of Ostium Secundum Atrial Septal Defect 1989   Status post repair in 1989/1990  . Hx of migraines   . IBS (irritable bowel syndrome)   . Incontinence   .  Intestinal volvulus [facility]) May 2007  . Loss of appetite   . Onychomycosis   . Osteopenia of femoral neck 02/24/2020  . Sleep apnea    uses mouthguard  . Syncope 11/07/2019  . Varicose veins     PAST SURGICAL HISTORY   Past Surgical History:  Procedure Laterality Date  . ABDOMINAL HYSTERECTOMY  2004  . ASD REPAIR, SECUNDUM  1990   Dr. Servando Snare  . BALLOON SINUPLASTY Left   . COLONOSCOPY    . EYE SURGERY     muscle reattachment 02/2017  . hysterectomy for severe endometriosis    . NASAL SEPTUM SURGERY     Dr. Georgia Lopes  . OPEN REDUCTION INTERNAL FIXATION (ORIF) DISTAL RADIAL FRACTURE Right 06/20/2017   Procedure: OPEN REDUCTION INTERNAL FIXATION (ORIF) DISTAL RADIAL FRACTURE;  Surgeon: Leanora Cover, MD;  Location: [place];  Service: Orthopedics;  Laterality: Right;  . right ankle surgery  2010  . right leg fracture with surgery     done with ankle surgery  . TRANSTHORACIC ECHOCARDIOGRAM   January 2012   Normal LV size and function, EF greater than 55%. Mild LA dilation. No intra-atrial shunt with bubble study. Normal pulmonary pressures. Only trace to mild mitral and tricuspid regurgitation.  . TRANSTHORACIC ECHOCARDIOGRAM  11/07/2019   EF 60 to 55%.  LVH.  GR 1 DD, with moderate LA dilation.  Relatively normal valves.  RA pressure estimated 8 mmHg.  [name] UPPER GASTROINTESTINAL ENDOSCOPY    . varicose laser vein surgery  2009    MEDICATIONS/ALLERGIES   Current Meds  Medication Sig  . acetaminophen (TYLENOL) 500 MG tablet Take 1,000 mg by mouth every 6 (six) hours as needed for mild pain.  [name] albuterol (VENTOLIN HFA) 108 (90 Base) MCG/ACT inhaler albuterol sulfate HFA 90 mcg/actuation aerosol inhaler  INL 1 TO 2 PFS PO Q 4 TO 6 H PRF COUGH OR WHZ  . Ascorbic Acid (VITA-C PO) Take by mouth.  . Azelastine HCl 137 MCG/SPRAY SOLN Place 1 spray into both nostrils daily.   [name] b complex vitamins capsule Take 1 capsule by mouth daily.  . Calcium Carb-Cholecalciferol  (CALTRATE 600+D) 600-800 MG-UNIT TABS Take 1 tablet by mouth 2 (two) times daily.  [name] EPIPEN 2-PAK 0.3 MG/0.3ML SOAJ injection   . fluticasone (FLONASE) 50 MCG/ACT nasal spray instill 2 sprays into each nostril once daily  . levocetirizine (XYZAL) 5 MG tablet Take 5 mg by mouth every evening.   . linaclotide (LINZESS) 145 MCG CAPS capsule Take 1 capsule (145 mcg total) by mouth daily before breakfast. Pharmacy- d/c amitiza. ineffective  . magnesium oxide (MAG-OX) 400 MG tablet Take 400 mg by mouth daily.  . montelukast (SINGULAIR) 10 MG tablet Take 10 mg by mouth at bedtime.   . Multiple Vitamin (MULTIVITAMINS PO) Take 1 tablet by mouth daily.    [name] omeprazole (PRILOSEC) 20 MG capsule Take 1 capsule (20 mg total) by mouth 2 (two) times daily before a meal.  . polyethylene glycol (MIRALAX / GLYCOLAX) packet Take 17 g by mouth daily.  [name] terbinafine (LAMISIL) 250 MG tablet Take 1 tablet (250 mg  total) by mouth daily.  . valACYclovir (VALTREX) 500 MG tablet Take 1 tablet (500 mg total) by mouth daily.  . [DISCONTINUED] cycloSPORINE (RESTASIS) 0.05 % ophthalmic emulsion Place 1 drop into both eyes 2 (two) times daily.     Allergies  Allergen Reactions  . Chlordiazepoxide-Clidinium Other (See Comments)    "loopy"  . Codeine Nausea And Vomiting    Dizziness  . Penicillins Hives    SOCIAL HISTORY/FAMILY HISTORY   Reviewed in Epic:  Pertinent findings: n/a  OBJCTIVE -PE, EKG, labs   Wt Readings from Last 3 Encounters:  06/26/20 175 lb 9.6 oz (79.7 kg)  06/24/20 178 lb 6.4 oz (80.9 kg)  04/30/20 176 lb (79.8 kg)    Physical Exam: BP 114/62   Pulse (!) 48   Ht 5\' 11"  (1.803 m)   Wt 178 lb 6.4 oz (80.9 kg)   BMI 24.88 kg/m  Physical Exam Constitutional:      General: She is not in acute distress.    Appearance: Normal appearance. She is normal weight.  HENT:     Head: Normocephalic and atraumatic.  Cardiovascular:     Rate and Rhythm: Normal rate and regular rhythm.     Pulses:  Normal pulses.     Heart sounds: Normal heart sounds. No murmur heard.  No friction rub. No gallop.   Pulmonary:     Effort: Pulmonary effort is normal. No respiratory distress.     Breath sounds: Normal breath sounds.  Musculoskeletal:        General: No swelling.  Neurological:     General: No focal deficit present.     Mental Status: She is alert and oriented to person, place, and time.  Psychiatric:        Mood and Affect: Mood normal.        Behavior: Behavior normal.        Thought Content: Thought content normal.        Judgment: Judgment normal.      Adult ECG Report Not checked  Recent Labs:    Lab Results  Component Value Date   CHOL 171 06/26/2020   HDL 43.40 06/26/2020   LDLCALC 109 (H) 06/26/2020   TRIG 90.0 06/26/2020   CHOLHDL 4 06/26/2020   Lab Results  Component Value Date   CREATININE 0.81 04/23/2020   BUN 19 04/23/2020   NA 141 04/23/2020   K 3.9 04/23/2020   CL 103 04/23/2020   CO2 31 04/23/2020   Lab Results  Component Value Date   TSH 2.50 03/05/2020    ASSESSMENT/PLAN    Problem List Items Addressed This Visit    ATRIAL SEPTAL DEFECT - Status Post Repair (Chronic)    Well-healed on echocardiogram.  No shunt noted.      Syncope and collapse - Primary    Her episode last fall was clearly related to micturition syncope/vasovagal syncope.  Clearly orthostatic in the ER with combination of diarrhea and post micturition.  We reiterated the importance of adequate hydration.  All other evaluation has been relatively normal.      Nonsustained ventricular tachycardia ([place])    1 short asymptomatic episodes on monitor.  Nonischemic Myoview.  Normal echo.  She has resting bradycardia.  No room for beta-blocker.  Simply monitor.  Likely benign.          COVID-19 Education: The signs and symptoms of COVID-19 were discussed with the patient and how to seek care for testing (follow up with PCP or arrange  E-visit).   The importance of social  distancing and COVID-19 vaccination was discussed today.  I spent a total of 15 minutes with the patient. >  50% of the time was spent in direct patient consultation.  Additional time spent with chart review  / charting (studies, outside notes, etc): 8 Total Time: 23 min   Current medicines are reviewed at length with the patient today.  (+/- concerns) n/a  Notice: This dictation was prepared with Dragon dictation along with smaller phrase technology. Any transcriptional errors that result from this process are unintentional and may not be corrected upon review.  Patient Instructions / Medication Changes & Studies & Tests Ordered   Patient Instructions  Medication Instructions:  No changes  *If you need a refill on your cardiac medications before your next appointment, please call your pharmacy*   Lab Work: Not needed   Testing/Procedures: Not needed   Follow-Up: At [facility], you and your health needs are our priority.  As part of our continuing mission to provide you with exceptional heart care, we have created designated Provider Care Teams.  These Care Teams include your primary Cardiologist (physician) and Advanced Practice Providers (APPs -  Physician Assistants and Nurse Practitioners) who all work together to provide you with the care you need, when you need it.    Your next appointment:   12 month(s)  The format for your next appointment:   In Person  Provider:   Glenetta Hew, MD     Studies Ordered:   No orders of the defined types were placed in this encounter.    Glenetta Hew, M.D., M.S. Interventional Cardiologist   Pager # [phone] Phone # [phone] [address]. [place], [place] [address]   Thank you for choosing Heartcare at [facility]!!

## 2020-06-24 NOTE — Patient Instructions (Signed)

## 2020-06-25 ENCOUNTER — Other Ambulatory Visit: Payer: Self-pay

## 2020-06-26 ENCOUNTER — Encounter: Payer: Managed Care, Other (non HMO) | Admitting: Family Medicine

## 2020-06-26 ENCOUNTER — Encounter: Payer: Self-pay | Admitting: Family Medicine

## 2020-06-26 ENCOUNTER — Ambulatory Visit (INDEPENDENT_AMBULATORY_CARE_PROVIDER_SITE_OTHER): Payer: Managed Care, Other (non HMO) | Admitting: Family Medicine

## 2020-06-26 VITALS — BP 102/62 | HR 59 | Temp 97.4°F | Ht 70.75 in | Wt 175.6 lb

## 2020-06-26 DIAGNOSIS — K219 Gastro-esophageal reflux disease without esophagitis: Secondary | ICD-10-CM | POA: Diagnosis not present

## 2020-06-26 DIAGNOSIS — D709 Neutropenia, unspecified: Secondary | ICD-10-CM | POA: Diagnosis not present

## 2020-06-26 DIAGNOSIS — Z0000 Encounter for general adult medical examination without abnormal findings: Secondary | ICD-10-CM | POA: Diagnosis not present

## 2020-06-26 DIAGNOSIS — K581 Irritable bowel syndrome with constipation: Secondary | ICD-10-CM | POA: Diagnosis not present

## 2020-06-26 LAB — MAGNESIUM: Magnesium: 2.2 mg/dL (ref 1.5–2.5)

## 2020-06-26 LAB — CBC WITH DIFFERENTIAL/PLATELET
Basophils Absolute: 0.1 10*3/uL (ref 0.0–0.1)
Basophils Relative: 0.7 % (ref 0.0–3.0)
Eosinophils Absolute: 0.2 10*3/uL (ref 0.0–0.7)
Eosinophils Relative: 1.7 % (ref 0.0–5.0)
HCT: 43.2 % (ref 36.0–46.0)
Hemoglobin: 14.4 g/dL (ref 12.0–15.0)
Lymphocytes Relative: 58 % — ABNORMAL HIGH (ref 12.0–46.0)
Lymphs Abs: 6.8 10*3/uL — ABNORMAL HIGH (ref 0.7–4.0)
MCHC: 33.4 g/dL (ref 30.0–36.0)
MCV: 95.7 fl (ref 78.0–100.0)
Monocytes Absolute: 0.7 10*3/uL (ref 0.1–1.0)
Monocytes Relative: 6 % (ref 3.0–12.0)
Neutro Abs: 3.9 10*3/uL (ref 1.4–7.7)
Neutrophils Relative %: 33.6 % — ABNORMAL LOW (ref 43.0–77.0)
Platelets: 319 10*3/uL (ref 150.0–400.0)
RBC: 4.51 Mil/uL (ref 3.87–5.11)
RDW: 12.9 % (ref 11.5–15.5)
WBC: 11.7 10*3/uL — ABNORMAL HIGH (ref 4.0–10.5)

## 2020-06-26 LAB — HEMOGLOBIN A1C: Hgb A1c MFr Bld: 5.7 % (ref 4.6–6.5)

## 2020-06-26 LAB — LIPID PANEL
Cholesterol: 171 mg/dL (ref 0–200)
HDL: 43.4 mg/dL (ref >39.00)
LDL Cholesterol: 109 mg/dL — ABNORMAL HIGH (ref 0–99)
NonHDL: 127.4
Total CHOL/HDL Ratio: 4
Triglycerides: 90 mg/dL (ref 0.0–149.0)
VLDL: 18 mg/dL (ref 0.0–40.0)

## 2020-06-26 MED ORDER — CYCLOSPORINE 0.05 % OP EMUL: 1.0000 [drp] | Freq: Two times a day (BID) | OPHTHALMIC | 5 refills | Status: DC

## 2020-06-26 NOTE — Progress Notes (Signed)
[name] is a 59 y.o. female  Chief Complaint  Patient presents with  . Annual Exam    Pt is fasting for lab work.  Pt would lkemher Magnesium level check, per cardiologist.  Pt also would to discuss weight.    HPI: [name] is a 59 y.o. female here for annual CPE. She had lipid panel in 10/2019, TSH and Vit D in 02/2020, and CBC, CMP in 04/2020. Pt would like CBC checked despite it being done in 04/2020 and heme/onc recommending q75mo.  Last PAP: a/p TAH in 2004 (non-cancerous reason) Last mammo: has referral for mammo Last Dexa: 02/2014 - osteopenia, takes calcium and Vit D supplement  Last colonoscopy: 11/2019 - Dr. Hilarie Fredrickson w/ [facility]  Diet/Exercise: healthy diet, very active and teaches exercise classes Dental: UTD Vision: UTD, wears contacts and glasses  Med refills needed today? See orders   Past Medical History:  Diagnosis Date  . Allergic rhinitis   . Allergy    dust  . Anxiety   . Back pain   . Contact lens/glasses fitting   . Depression   . Endometriosis   . Fatigue   . GERD (gastroesophageal reflux disease)   . Hiatal hernia   . History of Ostium Secundum Atrial Septal Defect 1989   Status post repair in 1989/1990  . Hx of migraines   . IBS (irritable bowel syndrome)   . Incontinence   . Intestinal volvulus [facility]) May 2007  . Loss of appetite   . Onychomycosis   . Osteopenia of femoral neck 02/24/2020  . Sleep apnea    uses mouthguard  . Syncope 11/07/2019  . Varicose veins     Past Surgical History:  Procedure Laterality Date  . ABDOMINAL HYSTERECTOMY  2004  . ASD REPAIR, SECUNDUM  1990   Dr. Servando Snare  . BALLOON SINUPLASTY Left   . COLONOSCOPY    . EYE SURGERY     muscle reattachment 02/2017  . hysterectomy for severe endometriosis    . NASAL SEPTUM SURGERY     Dr. Georgia Lopes  . OPEN REDUCTION INTERNAL FIXATION (ORIF) DISTAL RADIAL FRACTURE Right 06/20/2017   Procedure: OPEN REDUCTION INTERNAL FIXATION (ORIF) DISTAL RADIAL FRACTURE;   Surgeon: Leanora Cover, MD;  Location: [place];  Service: Orthopedics;  Laterality: Right;  . right ankle surgery  2010  . right leg fracture with surgery     done with ankle surgery  . TRANSTHORACIC ECHOCARDIOGRAM   January 2012   Normal LV size and function, EF greater than 55%. Mild LA dilation. No intra-atrial shunt with bubble study. Normal pulmonary pressures. Only trace to mild mitral and tricuspid regurgitation.  . TRANSTHORACIC ECHOCARDIOGRAM  11/07/2019   EF 60 to 55%.  LVH.  GR 1 DD, with moderate LA dilation.  Relatively normal valves.  RA pressure estimated 8 mmHg.  [name] UPPER GASTROINTESTINAL ENDOSCOPY    . varicose laser vein surgery  2009    Social History   Socioeconomic History  . Marital status: Married    Spouse name: Richard  . Number of children: Not on file  . Years of education: Not on file  . Highest education level: Not on file  Occupational History  . Occupation: remodel houses    Employer: [employer]  Tobacco Use  . Smoking status: Former Smoker    Packs/day: 1.00    Years: 10.00    Pack years: 10.00    Types: Cigarettes    Quit date:  12/12/1988    Years since quitting: 31.5  . Smokeless tobacco: Never Used  Vaping Use  . Vaping Use: Never used  Substance and Sexual Activity  . Alcohol use: Yes    Comment: social use/wine rare  . Drug use: No  . Sexual activity: Not on file  Other Topics Concern  . Not on file  Social History Narrative   They have 2 children and at least two grandchildren. She does all her activities of daily living. She works as a Midwife for [employer].     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 She does other days of the gym and she is able to at least 60 minutes a day 5 days a week.   She is a former smoker who quit in 1990. This was after smoking a pack a day for 10 years. She drinks social  alcohol on occasion.   Social Determinants of Health   Financial Resource Strain:   . Difficulty of Paying Living Expenses:   Food Insecurity:   . Worried About Charity fundraiser in the Last Year:   . Arboriculturist in the Last Year:   Transportation Needs:   . Film/video editor (Medical):   [name] Lack of Transportation (Non-Medical):   Physical Activity:   . Days of Exercise per Week:   . Minutes of Exercise per Session:   Stress:   . Feeling of Stress :   Social Connections:   . Frequency of Communication with Friends and Family:   . Frequency of Social Gatherings with Friends and Family:   . Attends Religious Services:   . Active Member of Clubs or Organizations:   . Attends Archivist Meetings:   [name] Marital Status:   Intimate Partner Violence:   . Fear of Current or Ex-Partner:   . Emotionally Abused:   [name] Physically Abused:   . Sexually Abused:     Family History  Problem Relation Age of Onset  . Diabetes Mother   . Colon cancer Paternal Grandfather   . Heart disease Maternal Grandfather   . Prostate cancer Maternal Grandfather   . Lung cancer Maternal Grandmother        with mets to the brain  . Clotting disorder Maternal Grandmother   . Clotting disorder Sister   . Heart disease Sister   . Colon polyps Neg Hx   . Esophageal cancer Neg Hx   . Rectal cancer Neg Hx   . Stomach cancer Neg Hx      Immunization History  Administered Date(s) Administered  . Influenza Split 10/25/2011, 10/25/2012  . Influenza Whole 09/17/2008, 10/08/2009, 11/11/2010  . Influenza,inj,Quad PF,6+ Mos 11/18/2013, 09/13/2017, 09/12/2018, 08/27/2019  . Influenza-Unspecified 09/11/2014, 11/01/2015, 11/01/2015  . PFIZER SARS-COV-2 Vaccination 02/14/2020, 03/07/2020  . Tdap 01/16/2012  . Zoster Recombinat (Shingrix) 03/17/2017, 09/13/2017    Outpatient Encounter Medications as of 06/26/2020  Medication Sig  . acetaminophen (TYLENOL) 500 MG tablet Take 1,000 mg by mouth  every 6 (six) hours as needed for mild pain.  [name] albuterol (VENTOLIN HFA) 108 (90 Base) MCG/ACT inhaler albuterol sulfate HFA 90 mcg/actuation aerosol inhaler  INL 1 TO 2 PFS PO Q 4 TO 6 H PRF COUGH OR WHZ  . Ascorbic Acid (VITA-C PO) Take by mouth.  . Azelastine HCl 137 MCG/SPRAY SOLN Place 1 spray into both nostrils daily.   [name]  b complex vitamins capsule Take 1 capsule by mouth daily.  . Calcium Carb-Cholecalciferol (CALTRATE 600+D) 600-800 MG-UNIT TABS Take 1 tablet by mouth 2 (two) times daily.  . cycloSPORINE (RESTASIS) 0.05 % ophthalmic emulsion Place 1 drop into both eyes 2 (two) times daily.  [name] EPIPEN 2-PAK 0.3 MG/0.3ML SOAJ injection   . fluticasone (FLONASE) 50 MCG/ACT nasal spray instill 2 sprays into each nostril once daily  . levocetirizine (XYZAL) 5 MG tablet Take 5 mg by mouth every evening.   . linaclotide (LINZESS) 145 MCG CAPS capsule Take 1 capsule (145 mcg total) by mouth daily before breakfast. Pharmacy- d/c amitiza. ineffective  . magnesium oxide (MAG-OX) 400 MG tablet Take 400 mg by mouth daily.  . montelukast (SINGULAIR) 10 MG tablet Take 10 mg by mouth at bedtime.   . Multiple Vitamin (MULTIVITAMINS PO) Take 1 tablet by mouth daily.    [name] omeprazole (PRILOSEC) 20 MG capsule Take 1 capsule (20 mg total) by mouth 2 (two) times daily before a meal.  . polyethylene glycol (MIRALAX / GLYCOLAX) packet Take 17 g by mouth daily.  [name] terbinafine (LAMISIL) 250 MG tablet Take 1 tablet (250 mg total) by mouth daily.  . valACYclovir (VALTREX) 500 MG tablet Take 1 tablet (500 mg total) by mouth daily.  . [DISCONTINUED] cycloSPORINE (RESTASIS) 0.05 % ophthalmic emulsion Place 1 drop into both eyes 2 (two) times daily.    No facility-administered encounter medications on file as of 06/26/2020.     ROS: Gen: no fever, chills  Skin: no rash, itching ENT: no ear pain, ear drainage, nasal congestion, rhinorrhea, sinus pressure, sore throat Eyes: no blurry vision, double vision Resp: no  cough, wheeze,SOB Breast: no breast tenderness, no nipple discharge, no breast masses CV: no CP, palpitations, LE edema,  GI: no heartburn, n/v/d/c, abd pain GU: no dysuria, urgency, frequency, hematuria MSK: no joint pain, myalgias, back pain Neuro: no dizziness, headache, weakness, vertigo Psych: no depression, anxiety, insomnia   Allergies  Allergen Reactions  . Chlordiazepoxide-Clidinium Other (See Comments)    "loopy"  . Codeine Nausea And Vomiting    Dizziness  . Penicillins Hives    BP 102/62 (BP Location: Left Arm, Patient Position: Sitting, Cuff Size: Normal)   Pulse (!) 59   Temp (!) 97.4 F (36.3 C) (Temporal)   Ht 5' 10.75" (1.797 m)   Wt 175 lb 9.6 oz (79.7 kg)   SpO2 98%   BMI 24.66 kg/m    BP Readings from Last 3 Encounters:  06/26/20 102/62  06/24/20 114/62  04/30/20 98/63   Pulse Readings from Last 3 Encounters:  06/26/20 (!) 59  06/24/20 (!) 48  04/30/20 71   Wt Readings from Last 3 Encounters:  06/26/20 175 lb 9.6 oz (79.7 kg)  06/24/20 178 lb 6.4 oz (80.9 kg)  04/30/20 176 lb (79.8 kg)     Physical Exam Constitutional:      General: She is not in acute distress.    Appearance: She is well-developed.  HENT:     Head: Normocephalic and atraumatic.     Right Ear: Tympanic membrane and ear canal normal.     Left Ear: Tympanic membrane and ear canal normal.     Nose: Nose normal.  Eyes:     Conjunctiva/sclera: Conjunctivae normal.     Pupils: Pupils are equal, round, and reactive to light.  Neck:     Thyroid: No thyromegaly.  Cardiovascular:     Rate and Rhythm: Normal rate and regular rhythm.  Heart sounds: Normal heart sounds. No murmur heard.   Pulmonary:     Effort: Pulmonary effort is normal. No respiratory distress.     Breath sounds: Normal breath sounds. No wheezing or rhonchi.  Abdominal:     General: Bowel sounds are normal. There is no distension.     Palpations: Abdomen is soft. There is no mass.     Tenderness: There  is no abdominal tenderness.  Musculoskeletal:     Cervical back: Neck supple.  Lymphadenopathy:     Cervical: No cervical adenopathy.  Skin:    General: Skin is warm and dry.  Neurological:     Mental Status: She is alert and oriented to person, place, and time.     Motor: No abnormal muscle tone.     Coordination: Coordination normal.  Psychiatric:        Behavior: Behavior normal.      A/P:  1. Annual physical exam - UTD on dental and vision - UTD on mammo, colonoscopy, dexa - discussed importance of regular CV exercise, healthy diet, adequate sleep - UTD on immunizations  - Hemoglobin A1c - Lipid panel - Magnesium - next CPE in 1 year  2. Gastroesophageal reflux disease, unspecified whether esophagitis present - controlled on omeprazole 20mg  daily  3. Irritable bowel syndrome with constipation - at baseline, controlled - cont current meds, exercise  4. Neutropenia, unspecified type ([place]) - CBC w/Diff - followed with heme/onc but released with q6-12 mo CBC w/ diff   This visit occurred during the SARS-CoV-2 public health emergency.  Safety protocols were in place, including screening questions prior to the visit, additional usage of staff PPE, and extensive cleaning of exam room while observing appropriate contact time as indicated for disinfecting solutions.

## 2020-06-28 ENCOUNTER — Encounter: Payer: Self-pay | Admitting: Cardiology

## 2020-06-28 NOTE — Assessment & Plan Note (Signed)
Well-healed on echocardiogram.  No shunt noted.

## 2020-06-28 NOTE — Assessment & Plan Note (Signed)
1 short asymptomatic episodes on monitor.  Nonischemic Myoview.  Normal echo.  She has resting bradycardia.  No room for beta-blocker.  Simply monitor.  Likely benign.

## 2020-06-28 NOTE — Assessment & Plan Note (Signed)
Her episode last fall was clearly related to micturition syncope/vasovagal syncope.  Clearly orthostatic in the ER with combination of diarrhea and post micturition.  We reiterated the importance of adequate hydration.  All other evaluation has been relatively normal.

## 2020-07-07 ENCOUNTER — Telehealth: Payer: Self-pay | Admitting: Family Medicine

## 2020-07-07 DIAGNOSIS — R7301 Impaired fasting glucose: Secondary | ICD-10-CM

## 2020-07-07 NOTE — Telephone Encounter (Signed)
Patient is calling to see if she can get a referral to Dietician because of her A1C and lab work. Please give her a call back at [phone].

## 2020-07-07 NOTE — Telephone Encounter (Signed)
Please see message and advise.  Thank you. ° °

## 2020-07-08 NOTE — Telephone Encounter (Signed)
Referral placed.

## 2020-07-26 ENCOUNTER — Encounter: Payer: Self-pay | Admitting: Family Medicine

## 2020-07-30 ENCOUNTER — Encounter: Payer: Self-pay | Admitting: Vascular Surgery

## 2020-07-30 ENCOUNTER — Ambulatory Visit: Payer: Managed Care, Other (non HMO) | Admitting: Vascular Surgery

## 2020-07-30 ENCOUNTER — Other Ambulatory Visit: Payer: Self-pay

## 2020-07-30 VITALS — BP 90/58 | HR 53 | Temp 97.2°F | Resp 14 | Ht 70.5 in | Wt 175.0 lb

## 2020-07-30 DIAGNOSIS — I83812 Varicose veins of left lower extremities with pain: Secondary | ICD-10-CM | POA: Diagnosis not present

## 2020-07-30 NOTE — Progress Notes (Signed)
REASON FOR VISIT:   Follow-up of painful varicose veins of the left lower extremity.  MEDICAL ISSUES:   CHRONIC VENOUS DISEASE: This patient has painful varicose veins of the left lower extremity.  She has failed conservative treatment including thigh-high compression stockings with a gradient of 20 to 30 mmHg, leg elevation, and Tylenol as needed for pain.  She would be a good candidate for laser ablation of the left small saphenous vein in addition to 5-10 stab phlebectomies.  She also has telangiectasias in her distal left leg and would require 1 unit of sclerotherapy.  I have discussed the indications for endovenous laser ablation of the left SSV, that is to lower the pressure in the veins and potentially help relieve the symptoms from venous hypertension. I have also discussed alternative options including conservative treatment with leg elevation, compression therapy, exercise, avoiding prolonged sitting and standing, and weight management. I have discussed the potential complications of the procedure, including, but not limited to: bleeding, bruising, leg swelling, nerve injury, skin burns, significant pain from phlebitis, deep venous thrombosis, or failure of the vein to close.  I have also explained that venous insufficiency is a chronic disease, and that the patient is at risk for recurrent varicose veins in the future.  All of the patient's questions were encouraged and answered. They are agreeable to proceed.   I have discussed with the patient the indications for stab phlebectomy.  I have explained to the patient that that will have small scars from the stab incisions.  I explained that the other risks include leg swelling, bruising, bleeding, and phlebitis.  All the patient's questions were encouraged and answered and they are agreeable to proceed.   HPI:   [name] is a pleasant 59 y.o. female who I saw in consultation on 04/30/2020 with painful varicose veins of the left  lower extremity.  She had significant symptoms related to her varicose veins and we discussed conservative treatment.  Of note she is undergone previous laser ablation of the right great saphenous vein over 10 years ago.  Conservative measures failed I thought she would be a candidate for laser ablation of the left small saphenous vein with 10-20 stabs.  She comes in for a 1-month follow-up visit.  Her venous duplex scan at the time of that visit showed no evidence of DVT in the left lower extremity.  There was no deep venous reflux.  There was superficial venous reflux in the small saphenous vein only.  The vein was dilated with diameters ranging from 0.46-0.58 cm.  There was no reflux in the left great saphenous vein.  I looked at the vein myself with the SonoSite and she appeared to have a vein of Giacomini with a thigh extension not connected to the deep system.  Since I saw her last she is continuing to have significant aching pain and heaviness in the left leg which is aggravated by standing.  Her symptoms are worse at the end of the day.  She has been wearing her thigh-high compression stockings which do not help significantly.  Leg elevation helps some but she cannot do this too often during the day.  She takes Tylenol as needed for pain.  Past Medical History:  Diagnosis Date  . Allergic rhinitis   . Allergy    dust  . Anxiety   . Back pain   . Contact lens/glasses fitting   . Depression   . Endometriosis   . Fatigue   .  GERD (gastroesophageal reflux disease)   . Hiatal hernia   . History of Ostium Secundum Atrial Septal Defect 1989   Status post repair in 1989/1990  . Hx of migraines   . IBS (irritable bowel syndrome)   . Incontinence   . Intestinal volvulus [facility]) May 2007  . Loss of appetite   . Onychomycosis   . Osteopenia of femoral neck 02/24/2020  . Sleep apnea    uses mouthguard  . Syncope 11/07/2019  . Varicose veins     Family History  Problem Relation Age of Onset    . Diabetes Mother   . Colon cancer Paternal Grandfather   . Heart disease Maternal Grandfather   . Prostate cancer Maternal Grandfather   . Lung cancer Maternal Grandmother        with mets to the brain  . Clotting disorder Maternal Grandmother   . Clotting disorder Sister   . Heart disease Sister   . Colon polyps Neg Hx   . Esophageal cancer Neg Hx   . Rectal cancer Neg Hx   . Stomach cancer Neg Hx     SOCIAL HISTORY: Social History   Tobacco Use  . Smoking status: Former Smoker    Packs/day: 1.00    Years: 10.00    Pack years: 10.00    Types: Cigarettes    Quit date: 12/12/1988    Years since quitting: 31.6  . Smokeless tobacco: Never Used  Substance Use Topics  . Alcohol use: Yes    Comment: social use/wine rare    Allergies  Allergen Reactions  . Chlordiazepoxide-Clidinium Other (See Comments)    "loopy"  . Codeine Nausea And Vomiting    Dizziness  . Penicillins Hives    Current Outpatient Medications  Medication Sig Dispense Refill  . acetaminophen (TYLENOL) 500 MG tablet Take 1,000 mg by mouth every 6 (six) hours as needed for mild pain.    [name] albuterol (VENTOLIN HFA) 108 (90 Base) MCG/ACT inhaler albuterol sulfate HFA 90 mcg/actuation aerosol inhaler  INL 1 TO 2 PFS PO Q 4 TO 6 H PRF COUGH OR WHZ    . Ascorbic Acid (VITA-C PO) Take by mouth.    . Azelastine HCl 137 MCG/SPRAY SOLN Place 1 spray into both nostrils daily.     [name] b complex vitamins capsule Take 1 capsule by mouth daily.    . Calcium Carb-Cholecalciferol (CALTRATE 600+D) 600-800 MG-UNIT TABS Take 1 tablet by mouth 2 (two) times daily.    . cycloSPORINE (RESTASIS) 0.05 % ophthalmic emulsion Place 1 drop into both eyes 2 (two) times daily. 1.5 mL 5  . EPIPEN 2-PAK 0.3 MG/0.3ML SOAJ injection     . fluticasone (FLONASE) 50 MCG/ACT nasal spray instill 2 sprays into each nostril once daily 16 g 1  . levocetirizine (XYZAL) 5 MG tablet Take 5 mg by mouth every evening.     . linaclotide (LINZESS) 145  MCG CAPS capsule Take 1 capsule (145 mcg total) by mouth daily before breakfast. Pharmacy- d/c amitiza. ineffective 30 capsule 3  . magnesium oxide (MAG-OX) 400 MG tablet Take 400 mg by mouth daily.    . montelukast (SINGULAIR) 10 MG tablet Take 10 mg by mouth at bedtime.     . Multiple Vitamin (MULTIVITAMINS PO) Take 1 tablet by mouth daily.      [name] omeprazole (PRILOSEC) 20 MG capsule Take 1 capsule (20 mg total) by mouth 2 (two) times daily before a meal. 60 capsule 11  . polyethylene glycol (MIRALAX /  GLYCOLAX) packet Take 17 g by mouth daily.    [name] terbinafine (LAMISIL) 250 MG tablet Take 1 tablet (250 mg total) by mouth daily. 90 tablet 0  . valACYclovir (VALTREX) 500 MG tablet Take 1 tablet (500 mg total) by mouth daily. 90 tablet 3   No current facility-administered medications for this visit.    REVIEW OF SYSTEMS:  [X]  denotes positive finding, [ ]  denotes negative finding Cardiac  Comments:  Chest pain or chest pressure:    Shortness of breath upon exertion:    Short of breath when lying flat:    Irregular heart rhythm:        Vascular    Pain in calf, thigh, or hip brought on by ambulation:    Pain in feet at night that wakes you up from your sleep:     Blood clot in your veins:    Leg swelling:         Pulmonary    Oxygen at home:    Productive cough:     Wheezing:         Neurologic    Sudden weakness in arms or legs:     Sudden numbness in arms or legs:     Sudden onset of difficulty speaking or slurred speech:    Temporary loss of vision in one eye:     Problems with dizziness:         Gastrointestinal    Blood in stool:     Vomited blood:         Genitourinary    Burning when urinating:     Blood in urine:        Psychiatric    Major depression:         Hematologic    Bleeding problems:    Problems with blood clotting too easily:        Skin    Rashes or ulcers:        Constitutional    Fever or chills:     PHYSICAL EXAM:   Vitals:   07/30/20  1324  BP: (!) 90/58  Pulse: (!) 53  Resp: 14  Temp: (!) 97.2 F (36.2 C)  TempSrc: Temporal  SpO2: 99%  Weight: 79.4 kg  Height: 5' 10.5" (1.791 m)    GENERAL: The patient is a well-nourished female, in no acute distress. The vital signs are documented above. CARDIAC: There is a regular rate and rhythm.  VASCULAR: She has bit varicose veins in her posterior left calf and telangiectasias in her left leg. I looked at the small saphenous vein myself with the SonoSite.  The vein is significantly dilated in the proximal calf.  Becomes then more dilated where it extends above the knee.  She appears to have a vein of Giacomini without connection to the deep system. PULMONARY: There is good air exchange bilaterally without wheezing or rales. ABDOMEN: Soft and non-tender with normal pitched bowel sounds.  MUSCULOSKELETAL: There are no major deformities or cyanosis. NEUROLOGIC: No focal weakness or paresthesias are detected. SKIN: There are no ulcers or rashes noted. PSYCHIATRIC: The patient has a normal affect.  DATA:    VENOUS DUPLEX: I reviewed her previous venous duplex scan of the left leg which showed no deep venous reflux.  There is no evidence of DVT.  She had reflux in the small saphenous vein only and not in the great saphenous vein.  The small saphenous vein was significantly dilated.  Deitra Mayo [facility]  [facility] [employer] [phone]

## 2020-08-04 ENCOUNTER — Other Ambulatory Visit: Payer: Self-pay | Admitting: Internal Medicine

## 2020-08-11 ENCOUNTER — Other Ambulatory Visit: Payer: Self-pay | Admitting: *Deleted

## 2020-08-11 ENCOUNTER — Encounter: Payer: Self-pay | Admitting: Family Medicine

## 2020-08-11 DIAGNOSIS — R7303 Prediabetes: Secondary | ICD-10-CM

## 2020-08-11 DIAGNOSIS — I83812 Varicose veins of left lower extremities with pain: Secondary | ICD-10-CM

## 2020-08-12 DIAGNOSIS — R7303 Prediabetes: Secondary | ICD-10-CM | POA: Insufficient documentation

## 2020-08-20 ENCOUNTER — Encounter: Payer: Self-pay | Admitting: Vascular Surgery

## 2020-08-25 ENCOUNTER — Encounter: Payer: Self-pay | Admitting: Family Medicine

## 2020-08-26 ENCOUNTER — Other Ambulatory Visit: Payer: Self-pay | Admitting: *Deleted

## 2020-08-26 DIAGNOSIS — F411 Generalized anxiety disorder: Secondary | ICD-10-CM

## 2020-08-26 MED ORDER — LORAZEPAM 1 MG PO TABS: ORAL_TABLET | ORAL | 0 refills | Status: DC

## 2020-09-01 NOTE — Telephone Encounter (Signed)
[name] spoke with patient.

## 2020-09-03 ENCOUNTER — Encounter: Payer: Self-pay | Admitting: Vascular Surgery

## 2020-09-03 ENCOUNTER — Other Ambulatory Visit: Payer: Self-pay

## 2020-09-03 ENCOUNTER — Ambulatory Visit: Payer: Managed Care, Other (non HMO) | Admitting: Vascular Surgery

## 2020-09-03 VITALS — BP 92/64 | HR 58 | Temp 97.9°F | Resp 16 | Ht 70.0 in | Wt 173.8 lb

## 2020-09-03 DIAGNOSIS — I83812 Varicose veins of left lower extremities with pain: Secondary | ICD-10-CM | POA: Diagnosis not present

## 2020-09-03 HISTORY — PX: ENDOVENOUS ABLATION SAPHENOUS VEIN W/ LASER: SUR449

## 2020-09-03 NOTE — Progress Notes (Signed)
     Laser Ablation Procedure    Date: 09/03/2020   [name] DOB:[birth year]  Consent signed: Yes      Surgeon: Gae Gallop  MD  Procedure: Laser Ablation: left Small Saphenous Vein  BP 92/64 (BP Location: Left Arm, Patient Position: Sitting, Cuff Size: Normal)   Pulse (!) 58   Temp 97.9 F (36.6 C) (Temporal)   Resp 16   Ht 5\' 10"  (1.778 m)   Wt 173 lb 12.8 oz (78.8 kg)   SpO2 97%   BMI 24.94 kg/m   Tumescent Anesthesia: 300 cc 0.9% NaCl with 50 cc Lidocaine HCL 1%  and 15 cc 8.4% NaHCO3  Local Anesthesia: 3 cc Lidocaine HCL and NaHCO3 (ratio 2:1)  7 watts continuous mode     Total energy: 852 JOULES    Total time: 121 SECONDS Treatment Length 22 cm  Laser Fiber Ref. #  [id number]                       Lot # [id number]  Stab Phlebectomy: 10-20 Sites: Thigh and Calf  Patient tolerated procedure well  Notes: Patient wore face mask.  All staff members wore facial masks and facial shields/goggles.  [name] took Ativan 1 mg on 09/03/2020 at 9:30 am and at 10:40am Description of Procedure:  After marking the course of the secondary varicosities, the patient was placed on the operating table in the prone position, and the left leg was prepped and draped in sterile fashion.   Local anesthetic was administered and under ultrasound guidance the saphenous vein was accessed with a micro needle and guide wire; then the mirco puncture sheath was placed.  A guide wire was inserted saphenopopliteal junction , followed by a 5 french sheath.  The position of the sheath and then the laser fiber below the junction was confirmed using the ultrasound.  Tumescent anesthesia was administered along the course of the saphenous vein using ultrasound guidance. The patient was placed in Trendelenburg position and protective laser glasses were placed on patient and staff, and the laser was fired at 7 watts continuous mode for a total of 852 joules.   For stab phlebectomies, local anesthetic  was administered at the previously marked varicosities, and tumescent anesthesia was administered around the vessels.  Ten to 20 stab wounds were made using the tip of an 11 blade. And using the vein hook, the phlebectomies were performed using a hemostat to avulse the varicosities.  Adequate hemostasis was achieved.     Steri strips were applied to the stab wounds and ABD pads and thigh high compression stockings were applied.  Ace wrap bandages were applied over the phlebectomy sites and at the top of the saphenopopliteal junction. Blood loss was less than 15 cc.  Discharge instructions reviewed with patient and hardcopy of discharge instructions given to patient to take home. The patient ambulated out of the operating room having tolerated the procedure well.

## 2020-09-03 NOTE — Progress Notes (Signed)
Patient name: [name] MRN: [id number] DOB: 06-Dec-1961 Sex: female  REASON FOR VISIT: For laser ablation of the left small saphenous vein and 10-20 stabs.  HPI: [name] is a 59 y.o. female who I last saw on 07/30/2020 with painful varicose veins of the left lower extremity.  She had failed conservative treatment.  I felt she would be a good candidate for laser ablation of the left small saphenous vein in addition to stab phlebectomies.  Of note when I looked at the small saphenous vein myself with the SonoSite the vein was significantly dilated in the proximal calf.  Became more dilated where it extended above the knee as a vein of Giacomini.  There did not appear to be a connection to the deep system.  She also had telangiectasias in her distal left leg and would require 1 unit of sclerotherapy.  Current Outpatient Medications  Medication Sig Dispense Refill  . acetaminophen (TYLENOL) 500 MG tablet Take 1,000 mg by mouth every 6 (six) hours as needed for mild pain.    [name] albuterol (VENTOLIN HFA) 108 (90 Base) MCG/ACT inhaler albuterol sulfate HFA 90 mcg/actuation aerosol inhaler  INL 1 TO 2 PFS PO Q 4 TO 6 H PRF COUGH OR WHZ    . Ascorbic Acid (VITA-C PO) Take by mouth.    . Azelastine HCl 137 MCG/SPRAY SOLN Place 1 spray into both nostrils daily.     [name] b complex vitamins capsule Take 1 capsule by mouth daily.    . Calcium Carb-Cholecalciferol (CALTRATE 600+D) 600-800 MG-UNIT TABS Take 1 tablet by mouth 2 (two) times daily.    . cycloSPORINE (RESTASIS) 0.05 % ophthalmic emulsion Place 1 drop into both eyes 2 (two) times daily. 1.5 mL 5  . EPIPEN 2-PAK 0.3 MG/0.3ML SOAJ injection     . fluticasone (FLONASE) 50 MCG/ACT nasal spray instill 2 sprays into each nostril once daily 16 g 1  . levocetirizine (XYZAL) 5 MG tablet Take 5 mg by mouth every evening.     [name] LINZESS 145 MCG CAPS capsule TAKE 1 CAPSULE (145 MCG TOTAL) BY MOUTH DAILY BEFORE BREAKFAST. 90 capsule 1  . LORazepam  (ATIVAN) 1 MG tablet Take 1 tablet 30 minutes before leaving house on day of procedure and bring second tablet with you to office. 2 tablet 0  . magnesium oxide (MAG-OX) 400 MG tablet Take 400 mg by mouth daily.    . montelukast (SINGULAIR) 10 MG tablet Take 10 mg by mouth at bedtime.     . Multiple Vitamin (MULTIVITAMINS PO) Take 1 tablet by mouth daily.      [name] omeprazole (PRILOSEC) 20 MG capsule Take 1 capsule (20 mg total) by mouth 2 (two) times daily before a meal. 60 capsule 11  . polyethylene glycol (MIRALAX / GLYCOLAX) packet Take 17 g by mouth daily.    [name] terbinafine (LAMISIL) 250 MG tablet Take 1 tablet (250 mg total) by mouth daily. 90 tablet 0  . valACYclovir (VALTREX) 500 MG tablet Take 1 tablet (500 mg total) by mouth daily. 90 tablet 3   No current facility-administered medications for this visit.    PHYSICAL EXAM: Vitals:   09/03/20 1058  BP: 92/64  Pulse: (!) 58  Resp: 16  Temp: 97.9 F (36.6 C)  TempSrc: Temporal  SpO2: 97%  Weight: 173 lb 12.8 oz (78.8 kg)  Height: 5\' 10"  (1.778 m)    PROCEDURE: Laser ablation left small saphenous vein.  Stab phlebectomies  TECHNIQUE: The patient  was brought to the exam room and the dilated veins were marked with the patient standing.  The patient was then placed prone.  I looked at the small saphenous vein with the SonoSite and felt that I could cannulate this in the mid calf.  The left leg was prepped and draped in usual sterile fashion.  Under ultrasound guidance, after the skin was anesthetized, I cannulated the small saphenous vein in the mid calf.  I then advanced the micropuncture sheath over the wire.  I then advanced the straight end of the wire up above the popliteal fossa into the vein of Giacomini.  There was no connection to the deep system.  I then advanced a 45 cm sheath over the wire and the wire and dilator were removed.  Next the laser fiber was positioned at the end of the sheath and then the sheath retracted.   Tumescent anesthesia was then administered circumferentially around the vein.  The patient was placed in Trendelenburg.  Laser glasses were placed.  Laser ablation was performed from the thigh extension of the small saphenous vein down to the mid calf.  22 cm of length were treated.  852 J of energy were used.  I then turned to the stab phlebectomies.  Approximately 15 small stab incisions were made with an 11 blade.  The vein was retracted above the skin with the hook and then gently excised using blunt dissection with a hemostat.  Pressure was held for hemostasis.  Pressure dressing was applied.  The patient tolerated the procedure well.  She will return in 1 week for a follow-up duplex.  Deitra Mayo [facility] [place] [phone]

## 2020-09-10 ENCOUNTER — Ambulatory Visit (INDEPENDENT_AMBULATORY_CARE_PROVIDER_SITE_OTHER): Payer: Self-pay | Admitting: Vascular Surgery

## 2020-09-10 ENCOUNTER — Encounter: Payer: Self-pay | Admitting: Vascular Surgery

## 2020-09-10 ENCOUNTER — Ambulatory Visit (HOSPITAL_COMMUNITY)
Admission: RE | Admit: 2020-09-10 | Discharge: 2020-09-10 | Disposition: A | Discharge status: Home / Self Care | Payer: Managed Care, Other (non HMO) | Source: Ambulatory Visit | Attending: Vascular Surgery | Admitting: Vascular Surgery

## 2020-09-10 ENCOUNTER — Other Ambulatory Visit: Payer: Self-pay

## 2020-09-10 VITALS — BP 104/68 | HR 53 | Temp 97.7°F | Resp 14

## 2020-09-10 DIAGNOSIS — I83812 Varicose veins of left lower extremities with pain: Secondary | ICD-10-CM | POA: Diagnosis not present

## 2020-09-10 DIAGNOSIS — Z48812 Encounter for surgical aftercare following surgery on the circulatory system: Secondary | ICD-10-CM

## 2020-09-10 NOTE — Progress Notes (Signed)
Patient name: [name] MRN: [id number] DOB: 09-Jan-1961 Sex: female  REASON FOR VISIT: Follow-up after endovenous laser ablation of the left small saphenous vein with 10-20 stabs  HPI: [name] is a 59 y.o. female who had presented with painful varicose veins of the left lower extremity.  She had failed conservative treatment.  She was felt to be a good candidate for laser ablation of the left small saphenous vein with 10-20 stabs.  She also had telangiectasias in her distal left leg and would require 1 unit of sclerotherapy.  On 09/03/2020 the patient underwent laser ablation of the left small saphenous vein with 10-20 stabs.  Of note the vein extended above the knee as a vein of Giacomini.  This did not appear to have a connection to the deep system.  She comes in for first follow-up visit.  She has no specific complaints.  She is been resuming her normal activity.  She has been wearing her thigh-high stockings during the day for the last week.   Current Outpatient Medications  Medication Sig Dispense Refill  . acetaminophen (TYLENOL) 500 MG tablet Take 1,000 mg by mouth every 6 (six) hours as needed for mild pain.    [name] albuterol (VENTOLIN HFA) 108 (90 Base) MCG/ACT inhaler albuterol sulfate HFA 90 mcg/actuation aerosol inhaler  INL 1 TO 2 PFS PO Q 4 TO 6 H PRF COUGH OR WHZ    . Ascorbic Acid (VITA-C PO) Take by mouth.    . Azelastine HCl 137 MCG/SPRAY SOLN Place 1 spray into both nostrils daily.     [name] b complex vitamins capsule Take 1 capsule by mouth daily.    . Calcium Carb-Cholecalciferol (CALTRATE 600+D) 600-800 MG-UNIT TABS Take 1 tablet by mouth 2 (two) times daily.    . cycloSPORINE (RESTASIS) 0.05 % ophthalmic emulsion Place 1 drop into both eyes 2 (two) times daily. 1.5 mL 5  . EPIPEN 2-PAK 0.3 MG/0.3ML SOAJ injection     . fluticasone (FLONASE) 50 MCG/ACT nasal spray instill 2 sprays into each nostril once daily 16 g 1  . levocetirizine (XYZAL) 5 MG tablet Take 5  mg by mouth every evening.     [name] LINZESS 145 MCG CAPS capsule TAKE 1 CAPSULE (145 MCG TOTAL) BY MOUTH DAILY BEFORE BREAKFAST. 90 capsule 1  . LORazepam (ATIVAN) 1 MG tablet Take 1 tablet 30 minutes before leaving house on day of procedure and bring second tablet with you to office. 2 tablet 0  . magnesium oxide (MAG-OX) 400 MG tablet Take 400 mg by mouth daily.    . montelukast (SINGULAIR) 10 MG tablet Take 10 mg by mouth at bedtime.     . Multiple Vitamin (MULTIVITAMINS PO) Take 1 tablet by mouth daily.      [name] omeprazole (PRILOSEC) 20 MG capsule Take 1 capsule (20 mg total) by mouth 2 (two) times daily before a meal. 60 capsule 11  . polyethylene glycol (MIRALAX / GLYCOLAX) packet Take 17 g by mouth daily.    [name] terbinafine (LAMISIL) 250 MG tablet Take 1 tablet (250 mg total) by mouth daily. 90 tablet 0  . valACYclovir (VALTREX) 500 MG tablet Take 1 tablet (500 mg total) by mouth daily. 90 tablet 3   No current facility-administered medications for this visit.   REVIEW OF SYSTEMS: Valu.Nieves ] denotes positive finding; [  ] denotes negative finding  CARDIOVASCULAR:  [ ]  chest pain   [ ]  dyspnea on exertion  [ ]  leg swelling  CONSTITUTIONAL:  [ ]  fever   [ ]  chills  PHYSICAL EXAM: Vitals:   09/10/20 1027  BP: 104/68  Pulse: (!) 53  Resp: 14  Temp: 97.7 F (36.5 C)  TempSrc: Temporal  SpO2: 98%   GENERAL: The patient is a well-nourished female, in no acute distress. The vital signs are documented above. CARDIOVASCULAR: There is a regular rate and rhythm. PULMONARY: There is good air exchange bilaterally without wheezing or rales. VASCULAR: She has a minimal bruising in her posterior medial left thigh.  Her Steri-Strips are intact.  DATA:  VENOUS DUPLEX: I have independently interpreted her venous duplex scan today.  There is no evidence of DVT.  The left small saphenous vein is successfully closed.  MEDICAL ISSUES:  S/P LASER ABLATION LEFT SMALL SAPHENOUS VEIN/10-20 STABS: The patient is  doing well status post laser ablation of the left small saphenous vein with 10-20 stabs.  I encouraged her to continue to wear her thigh-high stocking during the day for another week.  After that a knee-high stocking may be more practical.  We also discussed the importance of leg elevation.  In addition we discussed importance of exercise.  She will arrange for sclerotherapy with Estell Harpin, RN in the near future.  I will see her as needed.  Deitra Mayo [facility] [place] [phone]

## 2020-09-21 ENCOUNTER — Other Ambulatory Visit: Payer: Self-pay

## 2020-09-21 ENCOUNTER — Ambulatory Visit (INDEPENDENT_AMBULATORY_CARE_PROVIDER_SITE_OTHER): Payer: Managed Care, Other (non HMO)

## 2020-09-21 DIAGNOSIS — I8393 Asymptomatic varicose veins of bilateral lower extremities: Secondary | ICD-10-CM

## 2020-09-21 NOTE — Progress Notes (Signed)
Treated pt's spider veins on bilateral legs/thighs with Asclera 1% administered with a 27g butterfly.  Patient received a total of 4 mL. Easy access. Pt tolerated well. L leg s/p laser ablation -healing well. Will follow PRN. Provided post procedure care instructions on both handout and verbally.    Photos: Yes.    Compression stockings applied: Yes.

## 2020-09-22 ENCOUNTER — Encounter: Payer: Self-pay | Admitting: Family Medicine

## 2020-09-22 DIAGNOSIS — R7301 Impaired fasting glucose: Secondary | ICD-10-CM

## 2020-09-22 DIAGNOSIS — D709 Neutropenia, unspecified: Secondary | ICD-10-CM

## 2020-09-23 NOTE — Telephone Encounter (Signed)
Lab orders placed. Pls schedule pt for lab appt, does not need to be fasting

## 2020-09-29 ENCOUNTER — Other Ambulatory Visit: Payer: Self-pay

## 2020-09-30 ENCOUNTER — Encounter: Payer: Self-pay | Admitting: Family Medicine

## 2020-09-30 ENCOUNTER — Other Ambulatory Visit (INDEPENDENT_AMBULATORY_CARE_PROVIDER_SITE_OTHER): Payer: Managed Care, Other (non HMO)

## 2020-09-30 DIAGNOSIS — D72820 Lymphocytosis (symptomatic): Secondary | ICD-10-CM

## 2020-09-30 DIAGNOSIS — R7301 Impaired fasting glucose: Secondary | ICD-10-CM | POA: Diagnosis not present

## 2020-09-30 DIAGNOSIS — D709 Neutropenia, unspecified: Secondary | ICD-10-CM

## 2020-09-30 LAB — CBC
HCT: 42.1 % (ref 36.0–46.0)
Hemoglobin: 14.1 g/dL (ref 12.0–15.0)
MCHC: 33.5 g/dL (ref 30.0–36.0)
MCV: 93.6 fl (ref 78.0–100.0)
Platelets: 265 10*3/uL (ref 150.0–400.0)
RBC: 4.49 Mil/uL (ref 3.87–5.11)
RDW: 13 % (ref 11.5–15.5)
WBC: 10.1 10*3/uL (ref 4.0–10.5)

## 2020-09-30 LAB — HEMOGLOBIN A1C: Hgb A1c MFr Bld: 5.8 % (ref 4.6–6.5)

## 2020-10-07 ENCOUNTER — Other Ambulatory Visit: Payer: Self-pay

## 2020-10-07 ENCOUNTER — Other Ambulatory Visit (INDEPENDENT_AMBULATORY_CARE_PROVIDER_SITE_OTHER): Payer: Managed Care, Other (non HMO)

## 2020-10-07 ENCOUNTER — Encounter: Payer: Self-pay | Admitting: Family Medicine

## 2020-10-07 DIAGNOSIS — D709 Neutropenia, unspecified: Secondary | ICD-10-CM | POA: Diagnosis not present

## 2020-10-07 DIAGNOSIS — D72820 Lymphocytosis (symptomatic): Secondary | ICD-10-CM

## 2020-10-07 LAB — CBC WITH DIFFERENTIAL/PLATELET
Basophils Absolute: 0.1 10*3/uL (ref 0.0–0.1)
Basophils Relative: 0.7 % (ref 0.0–3.0)
Eosinophils Absolute: 0.2 10*3/uL (ref 0.0–0.7)
Eosinophils Relative: 1.7 % (ref 0.0–5.0)
HCT: 42.9 % (ref 36.0–46.0)
Hemoglobin: 14.4 g/dL (ref 12.0–15.0)
Lymphocytes Relative: 63.5 % — ABNORMAL HIGH (ref 12.0–46.0)
Lymphs Abs: 7.5 10*3/uL — ABNORMAL HIGH (ref 0.7–4.0)
MCHC: 33.5 g/dL (ref 30.0–36.0)
MCV: 93.3 fl (ref 78.0–100.0)
Monocytes Absolute: 0.7 10*3/uL (ref 0.1–1.0)
Monocytes Relative: 6.1 % (ref 3.0–12.0)
Neutro Abs: 3.3 10*3/uL (ref 1.4–7.7)
Neutrophils Relative %: 28 % — ABNORMAL LOW (ref 43.0–77.0)
Platelets: 289 10*3/uL (ref 150.0–400.0)
RBC: 4.6 Mil/uL (ref 3.87–5.11)
RDW: 13.3 % (ref 11.5–15.5)
WBC: 11.8 10*3/uL — ABNORMAL HIGH (ref 4.0–10.5)

## 2020-10-08 ENCOUNTER — Encounter: Payer: Self-pay | Admitting: Family Medicine

## 2020-10-08 ENCOUNTER — Ambulatory Visit: Payer: Managed Care, Other (non HMO) | Admitting: Family Medicine

## 2020-10-08 VITALS — BP 116/78 | HR 77 | Temp 98.3°F | Ht 70.0 in | Wt 174.8 lb

## 2020-10-08 DIAGNOSIS — R7303 Prediabetes: Secondary | ICD-10-CM

## 2020-10-08 DIAGNOSIS — D72820 Lymphocytosis (symptomatic): Secondary | ICD-10-CM

## 2020-10-08 DIAGNOSIS — M8580 Other specified disorders of bone density and structure, unspecified site: Secondary | ICD-10-CM

## 2020-10-08 NOTE — Progress Notes (Signed)
[name] is a 59 y.o. female  Chief Complaint  Patient presents with   Follow-up    f/u discuss labs    HPI: [name] is a 59 y.o. female seen today at her request to f/u on recent CBC w/ diff result.  She was seen by heme/onc Dr. Maylon Peppers for the first time in 09/2019 and had regular f/u until 04/2020. She was diagnosed with B cell lymphocytosis, plan was observation and q6-5mo CBC w/ diff to monitor for progression to CLL or other lymphoproliferative disorder. Dr. Maylon Peppers seemed to think this was low likelihood and heme/onc f/u was recommended to be PRN.  Pt has CBC w/ diff in 04/2020 and then requested it be done in 06/2020 and again in 09/2020. Lymphocytes have increased the past 2 times lab has been checked and I recommended pt schedule f/u with heme/onc.   Component     Latest Ref Rng & Units 09/24/2019 10/25/2019 11/07/2019 11/08/2019  WBC     4.0 - 10.5 K/uL 10.5 9.5 15.0 (H) 9.0  RBC     3.87 - 5.11 Mil/uL 4.58 4.55 4.56 4.15  Hemoglobin     12.0 - 15.0 g/dL 14.1 14.2 14.4 13.0  HCT     36 - 46 % 43.9 43.3 44.0 38.7  MCV     78.0 - 100.0 fl 95.9 95.2 96.5 93.3  MCH     26.0 - 34.0 pg 30.8 31.2 31.6 31.3  MCHC     30.0 - 36.0 g/dL 32.1 32.8 32.7 33.6  RDW     11.5 - 15.5 % 12.9 12.9 12.7 12.8  Platelets     150 - 400 K/uL 286 305 254 229  nRBC     0.0 - 0.2 % 0.0 0.0 0.0 0.0  Neutrophils     43 - 77 % 37 37 73   NEUT#     1.4 - 7.7 K/uL 3.9 3.5 10.9 (H)   Lymphocytes     12 - 46 % 54 51 19   Lymphocyte #     0.7 - 4.0 K/uL 5.7 (H) 4.9 (H) 2.8   Monocytes Relative     3 - 12 % 6 9 7    Monocyte #     0.1 - 1.0 K/uL 0.7 0.8 1.1 (H)   Eosinophil     0 - 5 % 2 2 1    Eosinophils Absolute     0.0 - 0.7 K/uL 0.2 0.2 0.2   Basophil     0 - 3 % 1 1 0   Basophils Absolute     0.0 - 0.1 K/uL 0.1 0.1 0.0   Immature Granulocytes     % 0 0 0   Abs Immature Granulocytes     0.00 - 0.07 K/uL 0.01 0.01 0.04    Component     Latest Ref Rng & Units  04/23/2020 06/26/2020  WBC     4.0 - 10.5 K/uL 9.8 11.7 (H)  RBC     3.87 - 5.11 Mil/uL 4.61 4.51  Hemoglobin     12.0 - 15.0 g/dL 14.3 14.4  HCT     36 - 46 % 43.4 43.2  MCV     78.0 - 100.0 fl 94.1 95.7  MCH     26.0 - 34.0 pg 31.0   MCHC     30.0 - 36.0 g/dL 32.9 33.4  RDW     11.5 - 15.5 % 13.1 12.9  Platelets  150 - 400 K/uL 287 319.0  nRBC     0.0 - 0.2 % 0.0   Neutrophils     43 - 77 % 33 33.6 (L)  NEUT#     1.4 - 7.7 K/uL 3.2 3.9  Lymphocytes     12 - 46 % 57 58.0 Repeated and verified X2. (H)  Lymphocyte #     0.7 - 4.0 K/uL 5.5 (H) 6.8 (H)  Monocytes Relative     3 - 12 % 7 6.0  Monocyte #     0.1 - 1.0 K/uL 0.7 0.7  Eosinophil     0 - 5 % 2 1.7  Eosinophils Absolute     0.0 - 0.7 K/uL 0.2 0.2  Basophil     0 - 3 % 1 0.7  Basophils Absolute     0.0 - 0.1 K/uL 0.1 0.1  Immature Granulocytes     % 0   Abs Immature Granulocytes     0.00 - 0.07 K/uL 0.01    Component     Latest Ref Rng & Units 09/30/2020 10/07/2020  WBC     4.0 - 10.5 K/uL 10.1 11.8 (H)  RBC     3.87 - 5.11 Mil/uL 4.49 4.60  Hemoglobin     12.0 - 15.0 g/dL 14.1 14.4  HCT     36 - 46 % 42.1 42.9  MCV     78.0 - 100.0 fl 93.6 93.3  MCH     26.0 - 34.0 pg    MCHC     30.0 - 36.0 g/dL 33.5 33.5  RDW     11.5 - 15.5 % 13.0 13.3  Platelets     150 - 400 K/uL 265.0 289.0  nRBC     0.0 - 0.2 %    Neutrophils     43 - 77 %  28.0 (L)  NEUT#     1.4 - 7.7 K/uL  3.3  Lymphocytes     12 - 46 %  63.5 (H)  Lymphocyte #     0.7 - 4.0 K/uL  7.5 (H)  Monocytes Relative     3 - 12 %  6.1  Monocyte #     0.1 - 1.0 K/uL  0.7  Eosinophil     0 - 5 %  1.7  Eosinophils Absolute     0.0 - 0.7 K/uL  0.2  Basophil     0 - 3 %  0.7  Basophils Absolute     0.0 - 0.1 K/uL  0.1  Immature Granulocytes     %    Abs Immature Granulocytes     0.00 - 0.07 K/uL     Pt with IFG and A1C = 5.8. Pt is working with a nutritionist. She is starting to exercise regularly.   Pt also with  osteopenia, T-score = -1.4 in 02/2020. She is taking calcium 1200mg  daily, Vit D 1000IU daily.    Past Medical History:  Diagnosis Date   Allergic rhinitis    Allergy    dust   Anxiety    Back pain    Contact lens/glasses fitting    Depression    Endometriosis    Fatigue    GERD (gastroesophageal reflux disease)    Hiatal hernia    History of Ostium Secundum Atrial Septal Defect 1989   Status post repair in 1989/1990   Hx of migraines    IBS (irritable bowel syndrome)    Incontinence  Intestinal volvulus [facility]) May 2007   Loss of appetite    Onychomycosis    Osteopenia of femoral neck 02/24/2020   Sleep apnea    uses mouthguard   Syncope 11/07/2019   Varicose veins     Past Surgical History:  Procedure Laterality Date   ABDOMINAL HYSTERECTOMY  2004   ASD REPAIR, SECUNDUM  1990   Dr. Jackie Plum SINUPLASTY Left    COLONOSCOPY     ENDOVENOUS ABLATION SAPHENOUS VEIN W/ LASER Left 09/03/2020   EVLA  LSSV  and stab phlebectomy  10-20   EYE SURGERY     muscle reattachment 02/2017   hysterectomy for severe endometriosis     NASAL SEPTUM SURGERY     Dr. Georgia Lopes   OPEN REDUCTION INTERNAL FIXATION (ORIF) DISTAL RADIAL FRACTURE Right 06/20/2017   Procedure: OPEN REDUCTION INTERNAL FIXATION (ORIF) DISTAL RADIAL FRACTURE;  Surgeon: Leanora Cover, MD;  Location: [place];  Service: Orthopedics;  Laterality: Right;   right ankle surgery  2010   right leg fracture with surgery     done with ankle surgery   TRANSTHORACIC ECHOCARDIOGRAM   January 2012   Normal LV size and function, EF greater than 55%. Mild LA dilation. No intra-atrial shunt with bubble study. Normal pulmonary pressures. Only trace to mild mitral and tricuspid regurgitation.   TRANSTHORACIC ECHOCARDIOGRAM  11/07/2019   EF 60 to 55%.  LVH.  GR 1 DD, with moderate LA dilation.  Relatively normal valves.  RA pressure estimated 8 mmHg.   UPPER GASTROINTESTINAL  ENDOSCOPY     varicose laser vein surgery  2009    Social History   Socioeconomic History   Marital status: Married    Spouse name: Richard   Number of children: Not on file   Years of education: Not on file   Highest education level: Not on file  Occupational History   Occupation: remodel houses    Employer: [employer]  Tobacco Use   Smoking status: Former Smoker    Packs/day: 1.00    Years: 10.00    Pack years: 10.00    Types: Cigarettes    Quit date: 12/12/1988    Years since quitting: 31.8   Smokeless tobacco: Never Used  Scientific laboratory technician Use: Never used  Substance and Sexual Activity   Alcohol use: Yes    Comment: social use/wine rare   Drug use: No   Sexual activity: Not on file  Other Topics Concern   Not on file  Social History Narrative   They have 2 children and at least two grandchildren. She does all her activities of daily living. She works as a Midwife for [employer].     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 She does other days of the gym and she is able to at least 60 minutes a day 5 days a week.   She is a former smoker who quit in 1990. This was after smoking a pack a day for 10 years. She drinks social alcohol on occasion.   Social Determinants of Health   Financial Resource Strain:    Difficulty of Paying Living Expenses: Not on file  Food Insecurity:    Worried About [place] in the Last Year: Not on file   [employer] of Food in the Last Year: Not on file  Transportation Needs:    Film/video editor (Medical): Not on file   Lack of Transportation (Non-Medical): Not on file  Physical Activity:    Days of Exercise per Week: Not on file   Minutes of Exercise per Session: Not on file  Stress:    Feeling of Stress : Not on file  Social Connections:    Frequency of Communication with Friends  and Family: Not on file   Frequency of Social Gatherings with Friends and Family: Not on file   Attends Religious Services: Not on file   Active Member of Clubs or Organizations: Not on file   Attends Archivist Meetings: Not on file   Marital Status: Not on file  Intimate Partner Violence:    Fear of Current or Ex-Partner: Not on file   Emotionally Abused: Not on file   Physically Abused: Not on file   Sexually Abused: Not on file    Family History  Problem Relation Age of Onset   Diabetes Mother    Colon cancer Paternal Grandfather    Heart disease Maternal Grandfather    Prostate cancer Maternal Grandfather    Lung cancer Maternal Grandmother        with mets to the brain   Clotting disorder Maternal Grandmother    Clotting disorder Sister    Heart disease Sister    Colon polyps Neg Hx    Esophageal cancer Neg Hx    Rectal cancer Neg Hx    Stomach cancer Neg Hx      Immunization History  Administered Date(s) Administered   Influenza Split 10/25/2011, 10/25/2012   Influenza Whole 09/17/2008, 10/08/2009, 11/11/2010   Influenza,inj,Quad PF,6+ Mos 11/18/2013, 09/13/2017, 09/12/2018, 08/27/2019   Influenza-Unspecified 11/01/2015, 11/01/2015, 09/17/2020   PFIZER SARS-COV-2 Vaccination 02/14/2020, 03/07/2020   Tdap 01/16/2012   Zoster Recombinat (Shingrix) 03/17/2017, 09/13/2017    Outpatient Encounter Medications as of 10/08/2020  Medication Sig   albuterol (VENTOLIN HFA) 108 (90 Base) MCG/ACT inhaler albuterol sulfate HFA 90 mcg/actuation aerosol inhaler  INL 1 TO 2 PFS PO Q 4 TO 6 H PRF COUGH OR WHZ   Ascorbic Acid (VITA-C PO) Take by mouth.   Azelastine HCl 137 MCG/SPRAY SOLN Place 1 spray into both nostrils daily.    b complex vitamins capsule Take 1 capsule by mouth daily.   Calcium Carb-Cholecalciferol (CALTRATE 600+D) 600-800 MG-UNIT TABS Take 1 tablet by mouth 2 (two) times daily.   cycloSPORINE (RESTASIS) 0.05 %  ophthalmic emulsion Place 1 drop into both eyes 2 (two) times daily.   EPIPEN 2-PAK 0.3 MG/0.3ML SOAJ injection    levocetirizine (XYZAL) 5 MG tablet Take 5 mg by mouth every evening.    LINZESS 145 MCG CAPS capsule TAKE 1 CAPSULE (145 MCG TOTAL) BY MOUTH DAILY BEFORE BREAKFAST.   LORazepam (ATIVAN) 1 MG tablet Take 1 tablet 30 minutes before leaving house on day of procedure and bring second tablet with you to office.   magnesium oxide (MAG-OX) 400 MG tablet Take 400 mg by mouth daily.   montelukast (SINGULAIR) 10 MG tablet Take 10 mg by mouth at bedtime.    Multiple Vitamin (MULTIVITAMINS PO) Take 1 tablet by mouth daily.     omeprazole (PRILOSEC) 20 MG capsule Take 1 capsule (20 mg total) by mouth 2 (two) times daily before a meal. (Patient taking differently: Take 20 mg by mouth daily. )   polyethylene glycol (MIRALAX / GLYCOLAX) packet Take 17 g by mouth daily.   valACYclovir (VALTREX) 500 MG tablet  Take 1 tablet (500 mg total) by mouth daily.   acetaminophen (TYLENOL) 500 MG tablet Take 1,000 mg by mouth every 6 (six) hours as needed for mild pain. (Patient not taking: Reported on 10/08/2020)   fluticasone (FLONASE) 50 MCG/ACT nasal spray instill 2 sprays into each nostril once daily (Patient not taking: Reported on 10/08/2020)   terbinafine (LAMISIL) 250 MG tablet Take 1 tablet (250 mg total) by mouth daily. (Patient not taking: Reported on 10/08/2020)   No facility-administered encounter medications on file as of 10/08/2020.     ROS: Pertinent positives and negatives noted in HPI. Remainder of ROS non-contributory   Allergies  Allergen Reactions   Chlordiazepoxide-Clidinium Other (See Comments)    "loopy"   Codeine Nausea And Vomiting    Dizziness   Penicillins Hives    BP 116/78    Pulse 77    Temp 98.3 F (36.8 C) (Temporal)    Ht 5\' 10"  (1.778 m)    Wt 174 lb 12.8 oz (79.3 kg)    SpO2 97%    BMI 25.08 kg/m    BP Readings from Last 3 Encounters:    10/08/20 116/78  09/10/20 104/68  09/03/20 92/64   Wt Readings from Last 3 Encounters:  10/08/20 174 lb 12.8 oz (79.3 kg)  09/03/20 173 lb 12.8 oz (78.8 kg)  07/30/20 175 lb (79.4 kg)   Physical Exam Constitutional:      General: She is not in acute distress.    Appearance: Normal appearance. She is not ill-appearing.  Pulmonary:     Effort: No respiratory distress.  Neurological:     Mental Status: She is alert and oriented to person, place, and time.  Psychiatric:        Mood and Affect: Mood normal.        Behavior: Behavior normal.      A/P:  1. Monoclonal B-cell lymphocytosis - plan to recheck cbc w diff in 3-4 mo, if lymphocytes continue to increase will recommend pt return to heme/onc for possible further eval or further f/u guidelines  2. Prediabetes - A1C = 5.8 - cont with low sugar, low carb diet and regular exercise - recheck in 46mo  3. Osteopenia, unspecified location - Dexa in 02/2020 - T-score = -1.4 - cont daily calcium and Vit D supplementation, regular weight-bearing exercise - plan for repeat Dexa in 3/203  I spent 30 min with the patient today discussing multiple diagnoses, treatment plans, further evaluation, follow-up.   This visit occurred during the SARS-CoV-2 public health emergency.  Safety protocols were in place, including screening questions prior to the visit, additional usage of staff PPE, and extensive cleaning of exam room while observing appropriate contact time as indicated for disinfecting solutions.

## 2020-10-09 ENCOUNTER — Other Ambulatory Visit: Payer: Managed Care, Other (non HMO)

## 2020-10-14 ENCOUNTER — Ambulatory Visit: Payer: Managed Care, Other (non HMO) | Admitting: Family

## 2020-10-14 ENCOUNTER — Other Ambulatory Visit: Payer: Managed Care, Other (non HMO)

## 2020-11-23 ENCOUNTER — Other Ambulatory Visit: Payer: Self-pay

## 2020-11-23 ENCOUNTER — Other Ambulatory Visit: Payer: Managed Care, Other (non HMO)

## 2020-11-23 ENCOUNTER — Telehealth: Payer: Self-pay

## 2020-11-23 ENCOUNTER — Telehealth: Payer: Self-pay | Admitting: Physician Assistant

## 2020-11-23 DIAGNOSIS — R197 Diarrhea, unspecified: Secondary | ICD-10-CM

## 2020-11-23 NOTE — Telephone Encounter (Signed)
Patient called states she is having a case of diarrhea and would like to discuss with a nurse

## 2020-11-23 NOTE — Telephone Encounter (Signed)
Spoke with the patient. She began having symptoms on Saturday 11/21/20. She reports nausea, chills, abdominal cramps and diarrhea. She has taken Pepto-Bismol and Tums for her nausea. Her husband gave her Lomotil that he had. She has taken 2. No diarrhea today. She has had oatmeal and peanut butter crackers today. She is going for a COVID test because despite this she still does not feel well.  Please advise.

## 2020-11-23 NOTE — Telephone Encounter (Signed)
Agree with COVID testing just to be sure I have had other patients recently with similar symptoms and thus this may just be an acute viral infection We can check a gi pathogen panel for enteric infections, diarrhea likely paused due to her lomotil Focus on hydration with gatorade or pedialyte

## 2020-11-23 NOTE — Telephone Encounter (Signed)
Pt calling this morning in regards to a stomach bug starting Saturday,  Pt has had diarrhea and she feels sick with body aches, and also abd cramping.  Pt would like to know if she can have a virtual visit today to discuss.  Please advise.  [phone]

## 2020-11-23 NOTE — Telephone Encounter (Signed)
Advised the patient of the recommendations. She agrees to this plan of care.

## 2020-11-26 ENCOUNTER — Telehealth: Payer: Self-pay | Admitting: Physician Assistant

## 2020-11-26 LAB — GI PROFILE, STOOL, PCR

## 2020-11-30 NOTE — Telephone Encounter (Signed)
Pt followed up with her Gastro office for help regarding these issues.

## 2020-12-10 ENCOUNTER — Encounter: Payer: Self-pay | Admitting: Family Medicine

## 2021-01-04 ENCOUNTER — Encounter: Payer: Self-pay | Admitting: Family Medicine

## 2021-01-09 ENCOUNTER — Encounter: Payer: Self-pay | Admitting: Family Medicine

## 2021-01-11 NOTE — Telephone Encounter (Signed)
Please see message and advise.  Thank you. ° °

## 2021-01-13 ENCOUNTER — Encounter: Payer: Self-pay | Admitting: Internal Medicine

## 2021-01-13 ENCOUNTER — Other Ambulatory Visit: Payer: Self-pay

## 2021-01-13 ENCOUNTER — Ambulatory Visit: Payer: Managed Care, Other (non HMO) | Admitting: Internal Medicine

## 2021-01-13 VITALS — BP 89/58 | HR 70 | Ht 70.0 in | Wt 176.0 lb

## 2021-01-13 DIAGNOSIS — K219 Gastro-esophageal reflux disease without esophagitis: Secondary | ICD-10-CM

## 2021-01-13 DIAGNOSIS — K5909 Other constipation: Secondary | ICD-10-CM | POA: Diagnosis not present

## 2021-01-13 NOTE — Patient Instructions (Signed)
You have been scheduled for an appointment with Dr Gerrit Heck in our [facility] office to discuss TIF on 01/28/21 at 3:40 pm.  Continue omeprazole 20 mg daily  Continue Linzess 145 mcg daily.  Continue Miralax.  If you are age 62 or younger, your body mass index should be between 19-25. Your Body mass index is 25.25 kg/m. If this is out of the aformentioned range listed, please consider follow up with your Primary Care Provider.   Due to recent changes in healthcare laws, you may see the results of your imaging and laboratory studies on MyChart before your provider has had a chance to review them.  We understand that in some cases there may be results that are confusing or concerning to you. Not all laboratory results come back in the same time frame and the provider may be waiting for multiple results in order to interpret others.  Please give [place] 48 hours in order for your provider to thoroughly review all the results before contacting the office for clarification of your results.

## 2021-01-13 NOTE — Progress Notes (Signed)
   Subjective:    Patient ID: [name], female    DOB: [birth year], 59 y.o.   MRN: [id number]  HPI [name] is a 60 year old female with a past medical history of GERD, chronic idiopathic constipation, diverticulosis, history of ASD status post repair, endometriosis, migraines, monoclonal B-cell lymphocytosis on observation who is seen for follow-up.  She was last seen in the office in March 2021 by Nicoletta Ba, PA-C.  She is here alone today.  She called [place] with an episode of diarrhea in December which had gone home for almost a 2-week period.  We ordered a GI pathogen panel and this revealed EPEC.  We treated this without antibiotics and has resolved entirely.  She reports that her reflux has been well controlled on omeprazole 20 mg daily.  She does worry about chronic PPI therapy and how it may affect her electrolytes and mineral absorption.  She does have a history of osteopenia and would ideally like to be off of PPI therapy.  She is not having dysphagia or odynophagia.  No abdominal pain.  Her bowel habits have returned to regular after her diarrheal illness in December.  She will sometimes skip a day without a bowel movement but for the most part is regular.  She is taking Linzess 145 mcg daily and MiraLAX 17 g daily.  She has read about TIF which she saw on Facebook and has questions today.  Review of Systems As per HPI, otherwise negative  Current Medications, Allergies, Past Medical History, Past Surgical History, Family History and Social History were reviewed in [employer] record.     Objective:   Physical Exam BP (!) 89/58   Pulse 70   Ht 5\' 10"  (1.778 m)   Wt 176 lb (79.8 kg)   SpO2 91%   BMI 25.25 kg/m  Gen: awake, alert, NAD HEENT: anicteric  CV: RRR, no mrg Pulm: CTA b/l Abd: soft, NT/ND, +BS throughout Ext: no c/c/e Neuro: nonfocal     Assessment & Plan:  60 year old female with a past medical history of GERD, chronic  idiopathic constipation, diverticulosis, history of ASD status post repair, endometriosis, migraines, monoclonal B-cell lymphocytosis on observation who is seen for follow-up.   1.  GERD --well-controlled on low-dose omeprazole 20 mg daily.  Without this medication she has considerable symptoms from her reflux disease.  She had an upper endoscopy last performed in December 2020 which showed a normal esophagus.  No evidence for hiatal hernia.  There was evidence for reactive gastropathy without H. pylori and benign fundic gland appearing polyps.  She is interested in TIF which we discussed today.  I would like to refer her to see my partner, Dr. Bryan Lemma, to further discuss her TIF candidacy.  I do think this would be a good option and likely lead to long-term reflux control without the need for PPI --For now continue omeprazole 20 mg daily --Referral to see Dr. Bryan Lemma to discuss possible TIF  2.  Chronic constipation --currently well controlled on Linzess 145 mcg daily and MiraLAX 17 g daily.  Continue current therapy  3.  E. coli diarrhea --resolved without therapy  4.  CRC screening --normal colonoscopy with the exception of mild sigmoid diverticulosis in December 2020, repeat in December 2030  5.  Monoclonal B-cell lymphocytosis --low risk for progression to CLL, being observed with hematology

## 2021-01-15 ENCOUNTER — Telehealth (INDEPENDENT_AMBULATORY_CARE_PROVIDER_SITE_OTHER): Payer: Managed Care, Other (non HMO) | Admitting: Nurse Practitioner

## 2021-01-15 ENCOUNTER — Encounter: Payer: Self-pay | Admitting: Nurse Practitioner

## 2021-01-15 VITALS — BP 124/62 | HR 70 | Temp 97.9°F | Wt 172.0 lb

## 2021-01-15 DIAGNOSIS — U071 COVID-19: Secondary | ICD-10-CM | POA: Diagnosis not present

## 2021-01-15 MED ORDER — ALBUTEROL SULFATE HFA 108 (90 BASE) MCG/ACT IN AERS: 1.0000 | INHALATION_SPRAY | Freq: Four times a day (QID) | RESPIRATORY_TRACT | 0 refills | Status: DC | PRN

## 2021-01-15 MED ORDER — BENZONATATE 100 MG PO CAPS: 100.0000 mg | ORAL_CAPSULE | Freq: Three times a day (TID) | ORAL | 0 refills | Status: DC | PRN

## 2021-01-15 NOTE — Patient Instructions (Signed)
Use saline sinus rinse as needed Use cool mist humidifier at home Use dayquil/nyquil every 8hrs x 3days then stop. Go to ED/urgent care if symptoms worsen.  COVID-19: What to Do if You Are Sick If you have a fever, cough or other symptoms, you might have COVID-19. Most people have mild illness and are able to recover at home. If you are sick:  Keep track of your symptoms.  If you have an emergency warning sign (including trouble breathing), call 911. Steps to help prevent the spread of COVID-19 if you are sick If you are sick with COVID-19 or think you might have COVID-19, follow the steps below to care for yourself and to help protect other people in your home and community. Stay home except to get medical care  Stay home. Most people with COVID-19 have mild illness and can recover at home without medical care. Do not leave your home, except to get medical care. Do not visit public areas.  Take care of yourself. Get rest and stay hydrated. Take over-the-counter medicines, such as acetaminophen, to help you feel better.  Stay in touch with your doctor. Call before you get medical care. Be sure to get care if you have trouble breathing, or have any other emergency warning signs, or if you think it is an emergency.  Avoid public transportation, ride-sharing, or taxis. Separate yourself from other people As much as possible, stay in a specific room and away from other people and pets in your home. If possible, you should use a separate bathroom. If you need to be around other people or animals in or outside of the home, wear a mask. Tell your close contactsthat they may have been exposed to COVID-19. An infected person can spread COVID-19 starting 48 hours (or 2 days) before the person has any symptoms or tests positive. By letting your close contacts know they may have been exposed to COVID-19, you are helping to protect everyone.  Additional guidance is available for those living in close  quarters and shared housing.  See COVID-19 and Animals if you have questions about pets.  If you are diagnosed with COVID-19, someone from the health department may call you. Answer the call to slow the spread. Monitor your symptoms  Symptoms of COVID-19 include fever, cough, or other symptoms.  Follow care instructions from your healthcare provider and local health department. Your local health authorities may give instructions on checking your symptoms and reporting information. When to seek emergency medical attention Look for emergency warning signs* for COVID-19. If someone is showing any of these signs, seek emergency medical care immediately:  Trouble breathing  Persistent pain or pressure in the chest  New confusion  Inability to wake or stay awake  Pale, gray, or blue-colored skin, lips, or nail beds, depending on skin tone *This list is not all possible symptoms. Please call your medical provider for any other symptoms that are severe or concerning to you. Call 911 or call ahead to your local emergency facility: Notify the operator that you are seeking care for someone who has or may have COVID-19. Call ahead before visiting your doctor  Call ahead. Many medical visits for routine care are being postponed or done by phone or telemedicine.  If you have a medical appointment that cannot be postponed, call your doctor's office, and tell them you have or may have COVID-19. This will help the office protect themselves and other patients. Get  tested  If you have symptoms of COVID-19,  get tested. While waiting for test results, you stay away from others, including staying apart from those living in your household.  You can visit your state, tribal, local, and territorialhealth department's website to look for the latest local information on testing sites. If you are sick, wear a mask over your nose and mouth  You should wear a mask over your nose and mouth if you must be  around other people or animals, including pets (even at home).  You don't need to wear the mask if you are alone. If you can't put on a mask (because of trouble breathing, for example), cover your coughs and sneezes in some other way. Try to stay at least 6 feet away from other people. This will help protect the people around you.  Masks should not be placed on young children under age 30 years, anyone who has trouble breathing, or anyone who is not able to remove the mask without help. Note: During the COVID-19 pandemic, medical grade facemasks are reserved for healthcare workers and some first responders. Cover your coughs and sneezes  Cover your mouth and nose with a tissue when you cough or sneeze.  Throw away used tissues in a lined trash can.  Immediately wash your hands with soap and water for at least 20 seconds. If soap and water are not available, clean your hands with an alcohol-based hand sanitizer that contains at least 60% alcohol. Clean your hands often  Wash your hands often with soap and water for at least 20 seconds. This is especially important after blowing your nose, coughing, or sneezing; going to the bathroom; and before eating or preparing food.  Use hand sanitizer if soap and water are not available. Use an alcohol-based hand sanitizer with at least 60% alcohol, covering all surfaces of your hands and rubbing them together until they feel dry.  Soap and water are the best option, especially if hands are visibly dirty.  Avoid touching your eyes, nose, and mouth with unwashed hands.  Handwashing Tips Avoid sharing personal household items  Do not share dishes, drinking glasses, cups, eating utensils, towels, or bedding with other people in your home.  Wash these items thoroughly after using them with soap and water or put in the dishwasher. Clean all "high-touch" surfaces everyday  Clean and disinfect high-touch surfaces in your "sick room" and bathroom; wear  disposable gloves. Let someone else clean and disinfect surfaces in common areas, but you should clean your bedroom and bathroom, if possible.  If a caregiver or other person needs to clean and disinfect a sick person's bedroom or bathroom, they should do so on an as-needed basis. The caregiver/other person should wear a mask and disposable gloves prior to cleaning. They should wait as long as possible after the person who is sick has used the bathroom before coming in to clean and use the bathroom. ? High-touch surfaces include phones, remote controls, counters, tabletops, doorknobs, bathroom fixtures, toilets, keyboards, tablets, and bedside tables.  Clean and disinfect areas that may have blood, stool, or body fluids on them.  Use household cleaners and disinfectants. Clean the area or item with soap and water or another detergent if it is dirty. Then, use a household disinfectant. ? Be sure to follow the instructions on the label to ensure safe and effective use of the product. Many products recommend keeping the surface wet for several minutes to ensure germs are killed. Many also recommend precautions such as wearing gloves and making sure you  have good ventilation during use of the product. ? Use a product from [employer] List N: Disinfectants for Coronavirus (PNPYY-51). ? Complete Disinfection Guidance When you can be around others after being sick with COVID-19 Deciding when you can be around others is different for different situations. Find out when you can safely end home isolation. For any additional questions about your care, contact your healthcare provider or state or local health department. 02/26/2020 Content source: [facility] ([facility]), Division of Viral Diseases This information is not intended to replace advice given to you by your health care provider. Make sure you discuss any questions you have with your health care provider. Document  Revised: 10/12/2020 Document Reviewed: 10/12/2020 Elsevier Patient Education  2021 [employer].

## 2021-01-15 NOTE — Progress Notes (Signed)
Virtual Visit via Video Note  I connected with@ on 01/15/21 at  9:30 AM EST by a video enabled telemedicine application and verified that I am speaking with the correct person using two identifiers.  Location: Patient:Home Provider: Office Participants: patient and provider  I discussed the limitations of evaluation and management by telemedicine and the availability of in person appointments. I also discussed with the patient that there may be a patient responsible charge related to this service. The patient expressed understanding and agreed to proceed.  CC:Pt tested positive for COVID yesterday and would like to discuss treatment options.  History of Present Illness: Had 3doses of pfizer: last dose 09/08/2020 Exposure at workplace. URI  This is a new problem. The current episode started yesterday. The problem has been unchanged. There has been no fever. Associated symptoms include congestion, coughing, ear pain, a plugged ear sensation, rhinorrhea, sinus pain, sneezing and a sore throat. Pertinent negatives include no abdominal pain, chest pain, diarrhea, dysuria, headaches, joint pain, joint swelling, nausea, neck pain, rash, swollen glands, vomiting or wheezing. She has tried acetaminophen and decongestant for the symptoms. The treatment provided mild relief.  no palpitation or dizziness Also lost sense of smell and taste. Wants referral for COVID treatment due to hx of neutropenia. Does not take any immunosuppressive medication at this time. Last relative neutrophil count of 28%, abs neutrophil 3.3K/ul, WBC 11.8K/ul.(10/07/2020)   Observations/Objective: Physical Exam Vitals reviewed.  Constitutional:      General: She is not in acute distress. Eyes:     Extraocular Movements: Extraocular movements intact.     Conjunctiva/sclera: Conjunctivae normal.  Cardiovascular:     Rate and Rhythm: Normal rate.     Pulses: Normal pulses.  Pulmonary:     Effort: Pulmonary effort is  normal. No respiratory distress.  Musculoskeletal:     Cervical back: Normal range of motion.  Neurological:     Mental Status: She is alert and oriented to person, place, and time.  Psychiatric:        Mood and Affect: Mood normal.        Behavior: Behavior normal.        Thought Content: Thought content normal.    Assessment and Plan: Aubre was seen today for acute visit.  Diagnoses and all orders for this visit:  COVID-19 -     Ambulatory referral for Covid Treatment -     benzonatate (TESSALON) 100 MG capsule; Take 1-2 capsules (100-200 mg total) by mouth 3 (three) times daily as needed. -     albuterol (VENTOLIN HFA) 108 (90 Base) MCG/ACT inhaler; Inhale 1-2 puffs into the lungs every 6 (six) hours as needed for wheezing or shortness of breath.   Follow Up Instructions: Use saline sinus rinse as needed Use cool mist humidifier at home Use dayquil/nyquil every 8hrs x 3days then stop. Go to ED/urgent care if symptoms worsen.   I discussed the assessment and treatment plan with the patient. The patient was provided an opportunity to ask questions and all were answered. The patient agreed with the plan and demonstrated an understanding of the instructions.   The patient was advised to call back or seek an in-person evaluation if the symptoms worsen or if the condition fails to improve as anticipated.  Wilfred Lacy, NP

## 2021-01-16 ENCOUNTER — Telehealth: Payer: Self-pay | Admitting: Infectious Diseases

## 2021-01-16 ENCOUNTER — Telehealth (HOSPITAL_COMMUNITY): Payer: Self-pay | Admitting: Pharmacist

## 2021-01-16 ENCOUNTER — Telehealth: Payer: Self-pay | Admitting: Family

## 2021-01-16 ENCOUNTER — Other Ambulatory Visit (HOSPITAL_COMMUNITY): Payer: Self-pay | Admitting: Infectious Diseases

## 2021-01-16 DIAGNOSIS — U071 COVID-19: Secondary | ICD-10-CM

## 2021-01-16 MED ORDER — NIRMATRELVIR/RITONAVIR (PAXLOVID)TABLET
3.0000 | ORAL_TABLET | Freq: Two times a day (BID) | ORAL | 0 refills | Status: AC
Start: 2021-01-16 — End: 2021-01-21

## 2021-01-16 MED FILL — PAXLOVID 20 X 150 MG & 10 X: 20 X 150 MG | 5 days supply | Qty: 30 | Fill #0

## 2021-01-16 NOTE — Telephone Encounter (Signed)
Outpatient Oral COVID Treatment Note  I connected with [name] on 01/16/2021/2:20 PM by telephone and verified that I am speaking with the correct person using two identifiers.  I discussed the limitations, risks, security, and privacy concerns of performing an evaluation and management service by telephone and the availability of in person appointments. I also discussed with the patient that there may be a patient responsible charge related to this service. The patient expressed understanding and agreed to proceed.  Patient location: [place] residence  Provider location: provider home   Diagnosis: COVID-19 infection  Purpose of visit: Discussion of potential use of Molnupiravir or Paxlovid, a new treatment for mild to moderate COVID-19 viral infection in non-hospitalized patients.   Subjective: Patient is a 60 y.o. female who has been diagnosed with COVID 19 viral infection.  Their symptoms began on 2/01 with URI, congestion, fatigue.    Past Medical History:  Diagnosis Date  . Allergic rhinitis   . Allergy    dust  . Anxiety   . Back pain   . Contact lens/glasses fitting   . Depression   . Endometriosis   . Fatigue   . GERD (gastroesophageal reflux disease)   . Hiatal hernia   . History of Ostium Secundum Atrial Septal Defect 1989   Status post repair in 1989/1990  . Hx of migraines   . IBS (irritable bowel syndrome)   . Incontinence   . Intestinal volvulus [facility]) May 2007  . Loss of appetite   . Monoclonal B-cell lymphocytosis   . Onychomycosis   . Osteopenia of femoral neck 02/24/2020  . Sleep apnea    uses mouthguard  . Syncope 11/07/2019  . Varicose veins     Allergies  Allergen Reactions  . Chlordiazepoxide-Clidinium Other (See Comments)    "loopy"  . Codeine Nausea And Vomiting    Dizziness  . Penicillins Hives     Current Outpatient Medications:  .  acetaminophen (TYLENOL) 500 MG tablet, Take 1,000 mg by mouth every 6 (six) hours as needed for mild  pain., Disp: , Rfl:  .  albuterol (VENTOLIN HFA) 108 (90 Base) MCG/ACT inhaler, Inhale 1-2 puffs into the lungs every 6 (six) hours as needed for wheezing or shortness of breath., Disp: 1 each, Rfl: 0 .  Ascorbic Acid (VITA-C PO), Take by mouth., Disp: , Rfl:  .  b complex vitamins capsule, Take 1 capsule by mouth daily., Disp: , Rfl:  .  benzonatate (TESSALON) 100 MG capsule, Take 1-2 capsules (100-200 mg total) by mouth 3 (three) times daily as needed., Disp: 30 capsule, Rfl: 0 .  Calcium Carb-Cholecalciferol 600-800 MG-UNIT TABS, Take 1 tablet by mouth 2 (two) times daily., Disp: , Rfl:  .  cycloSPORINE (RESTASIS) 0.05 % ophthalmic emulsion, Place 1 drop into both eyes 2 (two) times daily., Disp: 1.5 mL, Rfl: 5 .  EPIPEN 2-PAK 0.3 MG/0.3ML SOAJ injection, , Disp: , Rfl:  .  fluticasone (FLONASE) 50 MCG/ACT nasal spray, instill 2 sprays into each nostril once daily, Disp: 16 g, Rfl: 1 .  levocetirizine (XYZAL) 5 MG tablet, Take 5 mg by mouth every evening. , Disp: , Rfl:  .  LINZESS 145 MCG CAPS capsule, TAKE 1 CAPSULE (145 MCG TOTAL) BY MOUTH DAILY BEFORE BREAKFAST., Disp: 90 capsule, Rfl: 1 .  magnesium oxide (MAG-OX) 400 MG tablet, Take 400 mg by mouth daily., Disp: , Rfl:  .  montelukast (SINGULAIR) 10 MG tablet, Take 10 mg by mouth at bedtime. , Disp: , Rfl:  .  Multiple Vitamin (MULTIVITAMINS PO), Take 1 tablet by mouth daily., Disp: , Rfl:  .  omeprazole (PRILOSEC) 20 MG capsule, Take 1 capsule (20 mg total) by mouth 2 (two) times daily before a meal. (Patient taking differently: Take 20 mg by mouth daily.), Disp: 60 capsule, Rfl: 11 .  polyethylene glycol (MIRALAX / GLYCOLAX) packet, Take 17 g by mouth daily., Disp: , Rfl:  .  valACYclovir (VALTREX) 500 MG tablet, Take 1 tablet (500 mg total) by mouth daily., Disp: 90 tablet, Rfl: 3  Objective: Patient appears/sounds mildly ill.  They are in no apparent distress.  Breathing is non labored.  Mood and behavior are normal.  Laboratory  Data:  Recent Results (from the past 2160 hour(s))  GI Profile, Stool, PCR     Status: Abnormal   Collection Time: 11/23/20  4:01 PM  Result Value Ref Range   Campylobacter Not Detected Not Detected   C difficile toxin A/B Not Detected Not Detected   Plesiomonas shigelloides Not Detected Not Detected   Salmonella Not Detected Not Detected   Vibrio Not Detected Not Detected   Vibrio cholerae Not Detected Not Detected   Yersinia enterocolitica Not Detected Not Detected   Enteroaggregative E coli Not Detected Not Detected   Enteropathogenic E coli Detected (A) Not Detected    Comment:                   Client Requested Flag   Enterotoxigenic E coli Not Detected Not Detected   Shiga-toxin-producing E coli Not Detected Not Detected   E coli [id number] Not applicable Not Detected   Shigella/Enteroinvasive E coli Not Detected Not Detected   Cryptosporidium Not Detected Not Detected   Cyclospora cayetanensis Not Detected Not Detected   Entamoeba histolytica Not Detected Not Detected   Giardia lamblia Not Detected Not Detected   Adenovirus F 40/41 Not Detected Not Detected   Astrovirus Not Detected Not Detected   Norovirus GI/GII Not Detected Not Detected   Rotavirus A Not Detected Not Detected   Sapovirus Not Detected Not Detected     Assessment: 60 y.o. female with mild/moderate COVID 19 viral infection diagnosed on 2/3 with home test at high risk for progression to severe COVID 19.  Plan:  This patient is a 60 y.o. female that meets the following criteria for Emergency Use Authorization of: Paxlovid 1. Age >12 yr AND > 40 kg 2. SARS-COV-2 positive test 3. Symptom onset < 5 days 4. Mild-to-moderate COVID disease with high risk for severe progression to hospitalization or death  I have spoken and communicated the following to the patient or parent/caregiver regarding: 1. Paxlovid is an unapproved drug that is authorized for use under an Emergency Use Authorization.  2. There are no  adequate, approved, available products for the treatment of COVID-19 in adults who have mild-to-moderate COVID-19 and are at high risk for progressing to severe COVID-19, including hospitalization or death. 3. Other therapeutics are currently authorized. For additional information on all products authorized for treatment or prevention of COVID-19, please see [email or web address].  4. There are benefits and risks of taking this treatment as outlined in the "Fact Sheet for Patients and Caregivers."  5. "Fact Sheet for Patients and Caregivers" was reviewed with patient. A hard copy will be provided to patient from pharmacy prior to the patient receiving treatment. 6. Patients should continue to self-isolate and use infection control measures (e.g., wear mask, isolate, social distance, avoid sharing personal items, clean and disinfect "high touch" surfaces, and frequent  handwashing) according to CDC guidelines.  7. The patient or parent/caregiver has the option to accept or refuse treatment. 8. Patient medication history was reviewed for potential drug interactions:Interaction with home meds: patient will hold flonase  9. Patient's GFR was calculated to be 86 mL/min, and they were therefore prescribed Normal dose (GFR>60) - nirmatrelvir 150mg  tab (2 tablet) by mouth twice daily AND ritonavir 100mg  tab (1 tablet) by mouth twice daily  a. Patient last labs were > 22m ago. She has had many years of stable creatinine that is unchanged. She is eating and drinking and urinating normally. No medications currently that would make me concerned her kidney function would have acutely changed.   After reviewing above information with the patient, the patient agrees to receive Paxlovid.  Follow up instructions:    . Take prescription BID x 5 days as directed . Reach out to pharmacist for counseling on medication if  desired . For concerns regarding further COVID symptoms please follow up with your PCP or urgent care . For urgent or life-threatening issues, seek care at your local emergency department  The patient was provided an opportunity to ask questions, and all were answered. The patient agreed with the plan and demonstrated an understanding of the instructions.   Script sent to [facility] and opted to pick up RX.  The patient was advised to call their PCP or seek an in-person evaluation if the symptoms worsen or if the condition fails to improve as anticipated.   I provided 11 minutes of non face-to-face telephone visit time during this encounter, and > 50% was spent counseling as documented under my assessment & plan.  Janene Madeira, NP 01/16/2021 /2:20 PM

## 2021-01-16 NOTE — Telephone Encounter (Signed)
Called to discuss with patient about COVID-19 symptoms and the use of one of the available treatments for those with mild to moderate Covid symptoms and at a high risk of hospitalization.  Pt appears to qualify for outpatient treatment due to co-morbid conditions and/or a member of an at-risk group in accordance with the FDA Emergency Use Authorization.    Symptom onset: 01/14/21 (not confirmed) Vaccinated: Yes Booster? Yes Immunocompromised? No Qualifiers: Sleep apnea, history of neutropenia   I attempted to contact [name] to obtain additional information and was unable to reach her via phone. Left message with call back number. MyChart message also sent. Appears to be a good candidate for Paxlovid.  Terri Piedra, NP 01/16/2021 12:09 PM

## 2021-01-16 NOTE — Telephone Encounter (Signed)
Patient was prescribed oral covid treatment paxlovid and treatment note was reviewed. Medication has been received by {[facility]:[id number]] and reviewed for appropriateness.  Drug Interactions or Dosage Adjustments Noted: fluticasone nasal spray pt aware to hold  Delivery Method: pick up  Patient contacted for counseling on telephone and verbalized understanding.   Delivery or Pick-Up Date: 01/16/21   Marye Round 01/16/2021, 4:34 PM [facility] Pharmacist Phone# [phone]

## 2021-01-27 ENCOUNTER — Encounter: Payer: Self-pay | Admitting: Family Medicine

## 2021-01-27 ENCOUNTER — Other Ambulatory Visit: Payer: Self-pay | Admitting: Internal Medicine

## 2021-01-27 DIAGNOSIS — R7301 Impaired fasting glucose: Secondary | ICD-10-CM

## 2021-01-27 DIAGNOSIS — D72820 Lymphocytosis (symptomatic): Secondary | ICD-10-CM

## 2021-01-28 ENCOUNTER — Ambulatory Visit: Payer: Managed Care, Other (non HMO) | Admitting: Gastroenterology

## 2021-01-28 ENCOUNTER — Encounter: Payer: Self-pay | Admitting: Gastroenterology

## 2021-01-28 VITALS — BP 104/60 | HR 69 | Ht 70.0 in | Wt 174.5 lb

## 2021-01-28 DIAGNOSIS — K219 Gastro-esophageal reflux disease without esophagitis: Secondary | ICD-10-CM | POA: Diagnosis not present

## 2021-01-28 NOTE — Patient Instructions (Addendum)
If you are age 62 or older, your body mass index should be between 23-30. Your Body mass index is 25.04 kg/m. If this is out of the aforementioned range listed, please consider follow up with your Primary Care Provider.  If you are age 17 or younger, your body mass index should be between 19-25. Your Body mass index is 25.04 kg/m. If this is out of the aformentioned range listed, please consider follow up with your Primary Care Provider.   Due to recent changes in healthcare laws, you may see the results of your imaging and laboratory studies on MyChart before your provider has had a chance to review them.  We understand that in some cases there may be results that are confusing or concerning to you. Not all laboratory results come back in the same time frame and the provider may be waiting for multiple results in order to interpret others.  Please give [place] 48 hours in order for your provider to thoroughly review all the results before contacting the office for clarification of your results.    We will call you with the time for your Endoscopy- I have given you directions for the procedure you will need to fill in the dates.   Thank you for choosing me and [facility].  Vito Cirigliano, D.O.

## 2021-01-28 NOTE — Progress Notes (Signed)
P  Chief Complaint:    GERD  HPI:    [name] is a 60 year old female with a history of GERD, chronic idiopathic constipation, diverticulosis, history of ASD status post repair, endometriosis, migraines, monoclonal B-cell lymphocytosis on observation.  She is referred to me by Dr. Hilarie Fredrickson for evaluation of possible antireflux intervention with Transoral Incisionless Fundoplication (TIF) with a goal to stop or significantly reduce acid suppression therapy.  Reflux is generally well controlled with omeprazole 20 mg/daily, which she is concerned about long-term use of PPI.  She has immediate breakthrough symptoms when trying to titrate off medication.  She presents with her husband who also has longstanding history of reflux and interested in the same.  GERD history: -Index symptoms: Dyspepsia, heartburn, regurgitation -Exacerbating features: Spicy, greasy foods -Medications trialed: Dexilant, Tums -Current medications: Prilosec 20 mg daily -Complications: Possible hiatal hernia  GERD evaluation: -Last EGD: 11/2019 -Barium esophagram: None -UGI: 04/2011: Normal upper GI, no HH -Esophageal Manometry: None -pH/Impedance: -Bravo: Ordered today  Endoscopic History: -EGD (02/2011, Dr. Sharlett Iles): 4 cm HH, erosions at GEJ, normal stomach and duodenum -EGD (11/2019, Dr. Hilarie Fredrickson): Normal esophagus, fundic gland polyps, mild gastritis   GERD-HRQL Questionnaire Score: 2/50 (on PPI)  She otherwise follows with Dr. Hilarie Fredrickson for chronic idiopathic constipation, which is well controlled on Linzess and MiraLAX.  Review of systems:     No chest pain, no SOB, no fevers, no urinary sx   Past Medical History:  Diagnosis Date  . Allergic rhinitis   . Allergy    dust  . Anxiety   . Back pain   . Contact lens/glasses fitting   . Depression   . Endometriosis   . Fatigue   . GERD (gastroesophageal reflux disease)   . Hiatal hernia   . History of Ostium Secundum Atrial Septal Defect 1989    Status post repair in 1989/1990  . Hx of migraines   . IBS (irritable bowel syndrome)   . Incontinence   . Intestinal volvulus [facility]) May 2007  . Loss of appetite   . Monoclonal B-cell lymphocytosis   . Onychomycosis   . Osteopenia of femoral neck 02/24/2020  . Sleep apnea    uses mouthguard  . Syncope 11/07/2019  . Varicose veins     Patient's surgical history, family medical history, social history, medications and allergies were all reviewed in Epic    Current Outpatient Medications  Medication Sig Dispense Refill  . acetaminophen (TYLENOL) 500 MG tablet Take 1,000 mg by mouth every 6 (six) hours as needed for mild pain.    [name] albuterol (VENTOLIN HFA) 108 (90 Base) MCG/ACT inhaler Inhale 1-2 puffs into the lungs every 6 (six) hours as needed for wheezing or shortness of breath. 1 each 0  . Ascorbic Acid (VITA-C PO) Take by mouth.    [name] b complex vitamins capsule Take 1 capsule by mouth daily.    . benzonatate (TESSALON) 100 MG capsule Take 1-2 capsules (100-200 mg total) by mouth 3 (three) times daily as needed. 30 capsule 0  . Calcium Carb-Cholecalciferol 600-800 MG-UNIT TABS Take 1 tablet by mouth 2 (two) times daily.    . cycloSPORINE (RESTASIS) 0.05 % ophthalmic emulsion Place 1 drop into both eyes 2 (two) times daily. 1.5 mL 5  . EPIPEN 2-PAK 0.3 MG/0.3ML SOAJ injection     . fluticasone (FLONASE) 50 MCG/ACT nasal spray instill 2 sprays into each nostril once daily 16 g 1  . levocetirizine (XYZAL) 5 MG tablet Take 5 mg  by mouth every evening.     [name] LINZESS 145 MCG CAPS capsule TAKE 1 CAPSULE (145 MCG TOTAL) BY MOUTH DAILY BEFORE BREAKFAST. 90 capsule 1  . magnesium oxide (MAG-OX) 400 MG tablet Take 400 mg by mouth daily.    . montelukast (SINGULAIR) 10 MG tablet Take 10 mg by mouth at bedtime.     . Multiple Vitamin (MULTIVITAMINS PO) Take 1 tablet by mouth daily.    [name] omeprazole (PRILOSEC) 20 MG capsule Take 1 capsule (20 mg total) by mouth 2 (two) times daily before a  meal. (Patient taking differently: Take 20 mg by mouth daily.) 60 capsule 11  . polyethylene glycol (MIRALAX / GLYCOLAX) packet Take 17 g by mouth daily.    . valACYclovir (VALTREX) 500 MG tablet Take 1 tablet (500 mg total) by mouth daily. 90 tablet 3   No current facility-administered medications for this visit.    Physical Exam:     BP 104/60   Pulse 69   Ht 5\' 10"  (1.778 m)   Wt 174 lb 8 oz (79.2 kg)   BMI 25.04 kg/m   GENERAL:  Pleasant female in NAD PSYCH: : Cooperative, normal affect NEURO: Alert and oriented x 3, no focal neurologic deficits   IMPRESSION and PLAN:    18) GERD 60 year old female with longstanding history of reflux.  Reflux symptoms well controlled on current therapy, but she is concerned about long-term use of PPI and would like to stop or significantly reduce the need for acid suppression therapy via antireflux surgery.  Plan as following:  - EGD with Bravo -Evaluate for hiatal hernia, LES laxity at time of EGD -Continue antireflux lifestyle/dietary modifications for now -Continue Prilosec 20 mg/day for now, and then hold PPI x7 days prior to EGD and continue holding to duration of Bravo -Had a long discussion today regarding antireflux surgical options, to include TIF, [place], Nissen fundoplication, Stretta and the risks/benefits of each.  Provided she has strong objective evidence of reflux, she would like to proceed with TIF -Discussed postoperative dietary and exercise/activity restrictions  The indications, risks, and benefits of EGD with Bravo placement were explained to the patient in detail. Risks include but are not limited to bleeding, perforation, adverse reaction to medications, and cardiopulmonary compromise. Sequelae include but are not limited to the possibility of surgery, hospitalization, and mortality. The patient verbalized understanding and wished to proceed. All questions answered, referred to scheduler. Further recommendations pending  results of the exam.   I spent 45 minutes of time, including in depth chart review, independent review of results as outlined above, communicating results with the patient directly, face-to-face time with the patient, coordinating care, ordering studies and medications as appropriate, and documentation.           Lavena Bullion ,DO, FACG 01/28/2021, 3:45 PM

## 2021-02-01 ENCOUNTER — Other Ambulatory Visit: Payer: Self-pay

## 2021-02-01 ENCOUNTER — Other Ambulatory Visit (INDEPENDENT_AMBULATORY_CARE_PROVIDER_SITE_OTHER): Payer: Managed Care, Other (non HMO)

## 2021-02-01 DIAGNOSIS — R7301 Impaired fasting glucose: Secondary | ICD-10-CM

## 2021-02-01 DIAGNOSIS — D72820 Lymphocytosis (symptomatic): Secondary | ICD-10-CM

## 2021-02-01 LAB — CBC WITH DIFFERENTIAL/PLATELET
Basophils Absolute: 0.1 10*3/uL (ref 0.0–0.1)
Basophils Relative: 0.4 % (ref 0.0–3.0)
Eosinophils Absolute: 0.3 10*3/uL (ref 0.0–0.7)
Eosinophils Relative: 2.5 % (ref 0.0–5.0)
HCT: 43.4 % (ref 36.0–46.0)
Hemoglobin: 14.4 g/dL (ref 12.0–15.0)
Lymphocytes Relative: 63.2 % — ABNORMAL HIGH (ref 12.0–46.0)
Lymphs Abs: 7.3 10*3/uL — ABNORMAL HIGH (ref 0.7–4.0)
MCHC: 33.1 g/dL (ref 30.0–36.0)
MCV: 94 fl (ref 78.0–100.0)
Monocytes Absolute: 0.8 10*3/uL (ref 0.1–1.0)
Monocytes Relative: 6.9 % (ref 3.0–12.0)
Neutro Abs: 3.1 10*3/uL (ref 1.4–7.7)
Neutrophils Relative %: 27 % — ABNORMAL LOW (ref 43.0–77.0)
Platelets: 308 10*3/uL (ref 150.0–400.0)
RBC: 4.62 Mil/uL (ref 3.87–5.11)
RDW: 12.8 % (ref 11.5–15.5)
WBC: 11.6 10*3/uL — ABNORMAL HIGH (ref 4.0–10.5)

## 2021-02-01 LAB — HEMOGLOBIN A1C: Hgb A1c MFr Bld: 5.5 % (ref 4.6–6.5)

## 2021-02-02 ENCOUNTER — Encounter: Payer: Self-pay | Admitting: Family Medicine

## 2021-02-04 ENCOUNTER — Telehealth: Payer: Self-pay | Admitting: General Surgery

## 2021-02-04 DIAGNOSIS — K219 Gastro-esophageal reflux disease without esophagitis: Secondary | ICD-10-CM

## 2021-02-04 NOTE — Telephone Encounter (Signed)
Spoke with patient and schedule bravo egd 03/08/2021-instructions were given to the patient when she was in clinic. Went through them with her over the phone as she placed times in the blank slots. The patient verbalized understanding.

## 2021-02-06 ENCOUNTER — Other Ambulatory Visit: Payer: Self-pay | Admitting: Nurse Practitioner

## 2021-02-06 DIAGNOSIS — U071 COVID-19: Secondary | ICD-10-CM

## 2021-02-08 NOTE — Telephone Encounter (Signed)
Last vv 01/15/21 Last fill 01/15/21 #1

## 2021-02-24 ENCOUNTER — Telehealth: Payer: Self-pay | Admitting: Gastroenterology

## 2021-02-24 NOTE — Telephone Encounter (Signed)
I spoke with the patient on the phone today to answer her questions from her recent MyChart messages.  Plan for the following:  -Proceed with EGD with Bravo placement on 02/28/2021 as planned.  If clinically significant hiatal hernia, discussed that cTIF would be her better surgical option (CPT [phone]) as opposed to TIF (CPT 43210) -Depending on results of the Bravo, if significant amounts of esophageal acid exposure, explained that antireflux surgery could certainly be to her benefit.  If no significant esophageal acid exposure, or symptoms more consistent with esophageal hypersensitivity, then optimizing medical management would be a much better option -With regards to cost of the procedure and overall value, I think having the repeat EGD with Bravo data would allow [place] to better answer that question, although explained ultimately that decision rests with her  All questions answered and she was very appreciative of the phone call and conversation today.

## 2021-03-03 ENCOUNTER — Encounter: Payer: Self-pay | Admitting: Gastroenterology

## 2021-03-08 ENCOUNTER — Ambulatory Visit (AMBULATORY_SURGERY_CENTER): Payer: Managed Care, Other (non HMO) | Admitting: Gastroenterology

## 2021-03-08 ENCOUNTER — Other Ambulatory Visit: Payer: Self-pay

## 2021-03-08 ENCOUNTER — Encounter: Payer: Self-pay | Admitting: Gastroenterology

## 2021-03-08 VITALS — BP 82/50 | HR 51 | Temp 97.1°F | Resp 14 | Ht 70.0 in | Wt 174.0 lb

## 2021-03-08 DIAGNOSIS — K317 Polyp of stomach and duodenum: Secondary | ICD-10-CM

## 2021-03-08 DIAGNOSIS — K2100 Gastro-esophageal reflux disease with esophagitis, without bleeding: Secondary | ICD-10-CM | POA: Diagnosis present

## 2021-03-08 MED ORDER — SODIUM CHLORIDE 0.9 % IV SOLN: 500.0000 mL | Freq: Once | INTRAVENOUS | Status: DC

## 2021-03-08 NOTE — Progress Notes (Signed)
BRAVO instructions provided

## 2021-03-08 NOTE — Patient Instructions (Signed)
YOU HAD AN ENDOSCOPIC PROCEDURE TODAY AT THE [facility]:   Refer to the procedure report that was given to you for any specific questions about what was found during the examination.  If the procedure report does not answer your questions, please call your gastroenterologist to clarify.  If you requested that your care partner not be given the details of your procedure findings, then the procedure report has been included in a sealed envelope for you to review at your convenience later.  YOU SHOULD EXPECT: Some feelings of bloating in the abdomen. Passage of more gas than usual.  Walking can help get rid of the air that was put into your GI tract during the procedure and reduce the bloating. If you had a lower endoscopy (such as a colonoscopy or flexible sigmoidoscopy) you may notice spotting of blood in your stool or on the toilet paper. If you underwent a bowel prep for your procedure, you may not have a normal bowel movement for a few days.  Please Note:  You might notice some irritation and congestion in your nose or some drainage.  This is from the oxygen used during your procedure.  There is no need for concern and it should clear up in a day or so.  SYMPTOMS TO REPORT IMMEDIATELY:   Following lower endoscopy (colonoscopy or flexible sigmoidoscopy):  Excessive amounts of blood in the stool  Significant tenderness or worsening of abdominal pains  Swelling of the abdomen that is new, acute  Fever of 100F or higher   Following upper endoscopy (EGD)  Vomiting of blood or coffee ground material  New chest pain or pain under the shoulder blades  Painful or persistently difficult swallowing  New shortness of breath  Fever of 100F or higher  Black, tarry-looking stools  For urgent or emergent issues, a gastroenterologist can be reached at any hour by calling [phone]. Do not use MyChart messaging for urgent concerns.    DIET:  We do recommend a small meal at first, but  then you may proceed to your regular diet.  Drink plenty of fluids but you should avoid alcoholic beverages for 24 hours.  ACTIVITY:  You should plan to take it easy for the rest of today and you should NOT DRIVE or use heavy machinery until tomorrow (because of the sedation medicines used during the test).    FOLLOW UP: Our staff will call the number listed on your records 48-72 hours following your procedure to check on you and address any questions or concerns that you may have regarding the information given to you following your procedure. If we do not reach you, we will leave a message.  We will attempt to reach you two times.  During this call, we will ask if you have developed any symptoms of COVID 19. If you develop any symptoms (ie: fever, flu-like symptoms, shortness of breath, cough etc.) before then, please call [phone].  If you test positive for Covid 19 in the 2 weeks post procedure, please call and report this information to us.    If any biopsies were taken you will be contacted by phone or by letter within the next 1-3 weeks.  Please call us at [phone] if you have not heard about the biopsies in 3 weeks.    SIGNATURES/CONFIDENTIALITY: You and/or your care partner have signed paperwork which will be entered into your electronic medical record.  These signatures attest to the fact that that the information above on   your After Visit Summary has been reviewed and is understood.  Full responsibility of the confidentiality of this discharge information lies with you and/or your care-partner. 

## 2021-03-08 NOTE — Progress Notes (Signed)
Capsule expiration date- 06/10/22  Capsule ID number [id number]  LES measurement: 40 (capsule placed 6 cm above LES) 34  Time of implant: 1015

## 2021-03-08 NOTE — Progress Notes (Signed)
Report to PACU, RN, vss, BBS= Clear.  

## 2021-03-08 NOTE — Op Note (Signed)
[facility] Patient Name: [name] Procedure Date: 03/08/2021 9:55 AM MRN: [id number] Endoscopist: Gerrit Heck , MD Age: 60 Referring MD:  Date of Birth: [birth year] Gender: Female Account #: [id number] Procedure:                Upper GI endoscopy with Bravo placement Indications:              Suspected esophageal reflux, Preoperative assessment Medicines:                Monitored Anesthesia Care Procedure:                Pre-Anesthesia Assessment:                           - Prior to the procedure, a History and Physical                            was performed, and patient medications and                            allergies were reviewed. The patient's tolerance of                            previous anesthesia was also reviewed. The risks                            and benefits of the procedure and the sedation                            options and risks were discussed with the patient.                            All questions were answered, and informed consent                            was obtained. Prior Anticoagulants: The patient has                            taken no previous anticoagulant or antiplatelet                            agents. ASA Grade Assessment: II - A patient with                            mild systemic disease. After reviewing the risks                            and benefits, the patient was deemed in                            satisfactory condition to undergo the procedure.                           After obtaining informed consent, the endoscope was  passed under direct vision. Throughout the                            procedure, the patient's blood pressure, pulse, and                            oxygen saturations were monitored continuously. The                            Endoscope was introduced through the mouth, and                            advanced to the second part of duodenum. The upper                             GI endoscopy was accomplished without difficulty.                            The patient tolerated the procedure well. Scope In: Scope Out: Findings:                 The upper third of the esophagus and middle third                            of the esophagus were normal.                           LA Grade A (one or more mucosal breaks less than 5                            mm, not extending between tops of 2 mucosal folds)                            esophagitis with 2 small mucosal breaks and no                            bleeding was found 40 cm from the incisors.                           The Z-line was found 40 cm from the incisors. The                            BRAVO capsule with delivery system was introduced                            through the mouth and advanced into the esophagus,                            such that the BRAVO pH capsule was positioned 34 cm                            from the incisors, which was 6 cm proximal to the  GE junction. Suction was applied to the well of the                            BRAVO pH capsule to suck in the adjacent mucosa of                            the esophagus using the external vacuum pump set at                            a minimum vacuum pressure of 550 mmHg for 45                            seconds. The BRAVO pH capsule was then deployed by                            depressing the plunger on top of the handle to                            advance the locking pin into the mucosa, thereby                            attaching the capsule to the esophagus. The plunger                            was then rotated a quarter turn clockwise to                            release the capsule from the delivery system. The                            delivery system was then withdrawn. The endoscope                            was reintroduced to confirm appropriate placement                             in the esophagus. Endoscopy was utilized for probe                            placement and diagnostic evaluation.                           The gastroesophageal flap valve was visualized                            endoscopically and classified as Hill Grade III                            (minimal fold, loose to endoscope, hiatal hernia                            likely). No axial hernia was appreciated on  prolonged anterograde views.                           Multiple small sessile polyps with no bleeding were                            found in the gastric fundus and in the gastric                            body. Several of these polyps were removed with a                            cold biopsy forceps. Resection and retrieval were                            complete. Estimated blood loss was minimal.                           The incisura, gastric antrum and pylorus were                            normal.                           The examined duodenum was normal. Complications:            No immediate complications. Estimated Blood Loss:     Estimated blood loss was minimal. Impression:               - Normal upper third of esophagus and middle third                            of esophagus.                           - LA Grade A reflux esophagitis with no bleeding.                           - Z-line, 40 cm from the incisors.                           - Gastroesophageal flap valve classified as Hill                            Grade III (minimal fold, loose to endoscope, hiatal                            hernia likely).                           - Multiple gastric polyps. Resected and retrieved.                           - Normal incisura, antrum and pylorus.                           -  Normal examined duodenum.                           - The BRAVO pH capsule was positioned 34 cm from                            the incisors, which was 6 cm proximal to the  GE                            junction. Recommendation:           - Patient has a contact number available for                            emergencies. The signs and symptoms of potential                            delayed complications were discussed with the                            patient. Return to normal activities tomorrow.                            Written discharge instructions were provided to the                            patient.                           - Resume previous diet.                           - Continue present medications.                           - Conitnue holding acid suppression medications for                            the next 48 hours, then can resume previous                            prescription.                           - Await pathology results.                           - Return to GI clinic at appointment to be                            scheduled.                           - Based on the degree of LES laxity and advanced                            Hill grade,  if planning on antireflux surgery,                            recommend combined laparoscopic hiatal hernia                            repair/crural repair and TIF (cTIF). Gerrit Heck, MD 03/08/2021 10:27:38 AM

## 2021-03-10 ENCOUNTER — Other Ambulatory Visit: Payer: Self-pay | Admitting: Family Medicine

## 2021-03-10 ENCOUNTER — Telehealth: Payer: Self-pay | Admitting: *Deleted

## 2021-03-10 DIAGNOSIS — U071 COVID-19: Secondary | ICD-10-CM

## 2021-03-10 NOTE — Telephone Encounter (Signed)
  Follow up Call-  Call back number 03/08/2021 12/02/2019  Post procedure Call Back phone  # [phone]  Permission to leave phone message Yes Yes  Some recent data might be hidden     Patient questions:  Do you have a fever, pain , or abdominal swelling? No. Pain Score  0 *  Have you tolerated food without any problems? Yes.    Have you been able to return to your normal activities? Yes.    Do you have any questions about your discharge instructions: Diet   No. Medications  No. Follow up visit  No.  Do you have questions or concerns about your Care? No.  Actions: * If pain score is 4 or above: No action needed, pain <4.  1. Have you developed a fever since your procedure? no  2.   Have you had an respiratory symptoms (SOB or cough) since your procedure? no  3.   Have you tested positive for COVID 19 since your procedure no  4.   Have you had any family members/close contacts diagnosed with the COVID 19 since your procedure?  no   If yes to any of these questions please route to Joylene John, RN and Joella Prince, RN\

## 2021-03-12 ENCOUNTER — Telehealth: Payer: Self-pay

## 2021-03-12 NOTE — Telephone Encounter (Signed)
Per 03/08/21 procedure note - return to GI clinic at appointment to be scheduled   Patient is scheduled for a follow up with Dr. Bryan Lemma on Thursday, 04/22/21 at 3:40 PM.   Will mail letter with appt information.

## 2021-03-31 ENCOUNTER — Other Ambulatory Visit: Payer: Self-pay | Admitting: Family Medicine

## 2021-03-31 DIAGNOSIS — A609 Anogenital herpesviral infection, unspecified: Secondary | ICD-10-CM

## 2021-03-31 DIAGNOSIS — M8580 Other specified disorders of bone density and structure, unspecified site: Secondary | ICD-10-CM

## 2021-04-22 ENCOUNTER — Other Ambulatory Visit: Payer: Self-pay

## 2021-04-22 ENCOUNTER — Encounter: Payer: Self-pay | Admitting: Gastroenterology

## 2021-04-22 ENCOUNTER — Ambulatory Visit (INDEPENDENT_AMBULATORY_CARE_PROVIDER_SITE_OTHER): Payer: Managed Care, Other (non HMO) | Admitting: Gastroenterology

## 2021-04-22 VITALS — BP 98/62 | HR 60 | Ht 70.0 in | Wt 176.5 lb

## 2021-04-22 DIAGNOSIS — K2100 Gastro-esophageal reflux disease with esophagitis, without bleeding: Secondary | ICD-10-CM | POA: Diagnosis not present

## 2021-04-22 NOTE — Progress Notes (Signed)
P  Chief Complaint:    GERD, discuss antireflux surgery  GI History: 60 year old female with a history of GERD, chronic idiopathic constipation, diverticulosis, history of ASD status post repair, endometriosis, migraines,monoclonal B-cell lymphocytosis on observation.  She is referred to me by Dr. Hilarie Fredrickson for evaluation of possible antireflux intervention with Transoral Incisionless Fundoplication (TIF) with a goal to stop or significantly reduce acid suppression therapy.  Reflux is generally well controlled with omeprazole 20 mg/daily, which she is concerned about long-term use of PPI.  She had immediate breakthrough symptoms with previous attempts at titrating off medication.   GERD history: -Index symptoms: Dyspepsia, heartburn, regurgitation -Exacerbating features: Spicy, greasy foods -Medications trialed: Dexilant, Tums, Prilosec -Current medications:  None -Complications:  Erosive esophagitis  GERD evaluation: -Last EGD: 11/2019 -Barium esophagram: None -UGI: 04/2011: Normal upper GI, no HH -Esophageal Manometry: None -pH/Impedance: N/A -Bravo:  02/2021: pH <4 in 7%, DeMeester 17.6.  Increased supine reflux.  No symptom correlation with cough or belch by SAP  Endoscopic History: -EGD (02/2011, Dr. Sharlett Iles): 4 cm HH, erosions at GEJ, normal stomach and duodenum -EGD (11/2019, Dr. Hilarie Fredrickson): Normal esophagus, fundic gland polyps, mild gastritis - EGD with Bravo (02/2021, Dr. Bryan Lemma): LA Grade A esophagitis, Hill grade 3, fundic gland polyps.  Bravo placed   GERD-HRQL Questionnaire Score: 2/50 (on PPI)  She otherwise follows with Dr. Hilarie Fredrickson for chronic idiopathic constipation, which is well controlled on Linzess and MiraLAX.  HPI:     Patient is a 60 y.o. female presenting to the [facility] for follow-up.  EGD with Bravo placement in 02/2021.  EGD with LA grade a esophagitis and Hill grade 3 valve with recommendation for cTIF for crural  repair/fundoplication if considering antireflux surgery.  Bravo with clear evidence of abnormal esophageal acid exposure with pH <4 and 7% of the time with DeMeester score 17.6.  Increased supine reflux.  Today, she states she has since titrated off the omeprazole and feeling great.  Has reduced p.o. intake portions, eating healthier, back to exercising. Now eating 1800 calorie/day.  Has had no breakthrough reflux symptoms.  He is trying to get a bed with adjustable bed for HOB elevation.  Overall, states she feels very well and offers no complaints today.  Review of systems:     No chest pain, no SOB, no fevers, no urinary sx   Past Medical History:  Diagnosis Date  . Allergic rhinitis   . Allergy    dust  . Anxiety   . Back pain   . Contact lens/glasses fitting   . Depression   . Endometriosis   . Fatigue   . GERD (gastroesophageal reflux disease)   . Hiatal hernia   . History of Ostium Secundum Atrial Septal Defect 1989   Status post repair in 1989/1990  . Hx of migraines   . IBS (irritable bowel syndrome)   . Incontinence   . Intestinal volvulus [employer]) May 2007  . Loss of appetite   . Monoclonal B-cell lymphocytosis   . Onychomycosis   . Osteopenia of femoral neck 02/24/2020  . Sleep apnea    uses mouthguard  . Syncope 11/07/2019  . Varicose veins     Patient's surgical history, family medical history, social history, medications and allergies were all reviewed in Epic    Current Outpatient Medications  Medication Sig Dispense Refill  . acetaminophen (TYLENOL) 500 MG tablet Take 1,000 mg by mouth every 6 (six) hours as needed for mild pain.    [name]  albuterol (VENTOLIN HFA) 108 (90 Base) MCG/ACT inhaler INHALE 1-2 PUFFS BY MOUTH EVERY 6 HOURS AS NEEDED FOR WHEEZE OR SHORTNESS OF BREATH 6.7 each 2  . Ascorbic Acid (VITA-C PO) Take by mouth.    [name] b complex vitamins capsule Take 1 capsule by mouth daily.    . benzonatate (TESSALON) 100 MG capsule Take 1-2 capsules (100-200 mg  total) by mouth 3 (three) times daily as needed. 30 capsule 0  . Calcium Carb-Cholecalciferol 600-800 MG-UNIT TABS Take 1 tablet by mouth 2 (two) times daily.    . cycloSPORINE (RESTASIS) 0.05 % ophthalmic emulsion Place 1 drop into both eyes 2 (two) times daily. 1.5 mL 5  . EPIPEN 2-PAK 0.3 MG/0.3ML SOAJ injection     . fluticasone (FLONASE) 50 MCG/ACT nasal spray instill 2 sprays into each nostril once daily 16 g 1  . levocetirizine (XYZAL) 5 MG tablet Take 5 mg by mouth every evening.     [name] LINZESS 145 MCG CAPS capsule TAKE 1 CAPSULE (145 MCG TOTAL) BY MOUTH DAILY BEFORE BREAKFAST. 90 capsule 1  . magnesium oxide (MAG-OX) 400 MG tablet Take 400 mg by mouth daily.    . montelukast (SINGULAIR) 10 MG tablet Take 10 mg by mouth at bedtime.  (Patient not taking: Reported on 03/08/2021)    . Multiple Vitamin (MULTIVITAMINS PO) Take 1 tablet by mouth daily.    Wilfrid Lund & Ritonavir 20 x 150 MG & 10 x 100MG  TBPK TAKE 3 TABLETS BY MOUTH 2 (TWO) TIMES DAILY FOR 5 DAYS. 30 each 0  . omeprazole (PRILOSEC) 20 MG capsule Take 1 capsule (20 mg total) by mouth 2 (two) times daily before a meal. (Patient taking differently: Take 20 mg by mouth daily.) 60 capsule 11  . polyethylene glycol (MIRALAX / GLYCOLAX) packet Take 17 g by mouth daily.    . valACYclovir (VALTREX) 500 MG tablet TAKE ONE TABLET BY MOUTH ONE TIME DAILY 90 tablet 3   No current facility-administered medications for this visit.    Physical Exam:     There were no vitals taken for this visit.  GENERAL:  Pleasant female in NAD PSYCH: : Cooperative, normal affect NEURO: Alert and oriented x 3, no focal neurologic deficits   IMPRESSION and PLAN:    1) GERD - Longstanding history of reflux, but has recently been able to titrate off PPI without any breakthrough symptoms using aggressive dietary/lifestyle modifications - Applauded her excellent dietary/lifestyle modifications - Agree with plan for [facility] elevation given predominance of  nocturnal reflux on recent Bravo study - She would like to hold off on any antireflux surgical intervention at this juncture.  Given her current clinical situation, agree with that plan - Should she have breakthrough reflux, plan to restart omeprazole and can again discuss the role of antireflux surgery with cTIF at that time  Can follow-up with me prn           Lavena Bullion ,DO, FACG 04/22/2021, 3:26 PM

## 2021-04-22 NOTE — Patient Instructions (Signed)
If you are age 62 or older, your body mass index should be between 23-30. Your Body mass index is 25.33 kg/m. If this is out of the aforementioned range listed, please consider follow up with your Primary Care Provider.  If you are age 86 or younger, your body mass index should be between 19-25. Your Body mass index is 25.33 kg/m. If this is out of the aformentioned range listed, please consider follow up with your Primary Care Provider.   Due to recent changes in healthcare laws, you may see the results of your imaging and laboratory studies on MyChart before your provider has had a chance to review them.  We understand that in some cases there may be results that are confusing or concerning to you. Not all laboratory results come back in the same time frame and the provider may be waiting for multiple results in order to interpret others.  Please give [place] 48 hours in order for your provider to thoroughly review all the results before contacting the office for clarification of your results.   Follow up as needed.  Thank you for choosing me and [facility].  Vito Cirigliano, D.O.

## 2021-05-04 ENCOUNTER — Encounter: Payer: Self-pay | Admitting: Family Medicine

## 2021-05-04 ENCOUNTER — Telehealth: Payer: Self-pay | Admitting: Family Medicine

## 2021-05-04 NOTE — Telephone Encounter (Signed)
Please advise pt at [phone] concerning the Patient Message she sent in earlier today.

## 2021-05-04 NOTE — Telephone Encounter (Signed)
Please see message and advise.  Thank you. ° °

## 2021-05-04 NOTE — Telephone Encounter (Signed)
I sent pt a Mychart message. 

## 2021-05-06 ENCOUNTER — Ambulatory Visit (INDEPENDENT_AMBULATORY_CARE_PROVIDER_SITE_OTHER)
Admission: RE | Admit: 2021-05-06 | Discharge: 2021-05-06 | Disposition: A | Discharge status: Home / Self Care | Payer: Managed Care, Other (non HMO) | Source: Ambulatory Visit | Attending: Family Medicine | Admitting: Family Medicine

## 2021-05-06 ENCOUNTER — Ambulatory Visit: Payer: Managed Care, Other (non HMO) | Admitting: Family Medicine

## 2021-05-06 ENCOUNTER — Encounter: Payer: Self-pay | Admitting: Family Medicine

## 2021-05-06 ENCOUNTER — Other Ambulatory Visit: Payer: Self-pay

## 2021-05-06 VITALS — BP 108/64 | HR 65 | Temp 97.2°F | Ht 70.0 in | Wt 172.6 lb

## 2021-05-06 DIAGNOSIS — S39012A Strain of muscle, fascia and tendon of lower back, initial encounter: Secondary | ICD-10-CM

## 2021-05-06 DIAGNOSIS — S29019A Strain of muscle and tendon of unspecified wall of thorax, initial encounter: Secondary | ICD-10-CM | POA: Diagnosis not present

## 2021-05-06 MED ORDER — METHOCARBAMOL 750 MG PO TABS: 750.0000 mg | ORAL_TABLET | Freq: Four times a day (QID) | ORAL | 0 refills | Status: DC | PRN

## 2021-05-06 MED ORDER — KETOROLAC TROMETHAMINE 60 MG/2ML IM SOLN
60.0000 mg | Freq: Once | INTRAMUSCULAR | Status: AC
  Administered 2021-05-06: 60 mg via INTRAMUSCULAR

## 2021-05-06 NOTE — Progress Notes (Signed)
Established Patient Office Visit  Subjective:  Patient ID: [name], female    DOB: 04/28/1961  Age: 60 y.o. MRN: [id number]  CC:  Chief Complaint  Patient presents with  . Back Pain    Back pains started last night painful when walking also states that she is constipated x 3 days.     HPI [name] presents for acute lower back pain and mid back pain.  Denies traumatic injury but has been doing a lot of bending and stooping while getting a beach condo ready for the summer.  She is feeling the muscles tighten and squeeze.  There is pain across her lower back that moves to her lateral hips.  There is no weakness.  Denies numbness or change tingling.  Chronic ongoing constipation.  Bone density scan in 2019 showed mild osteopenia.  Past Medical History:  Diagnosis Date  . Allergic rhinitis   . Allergy    dust  . Anxiety   . Back pain   . Contact lens/glasses fitting   . Depression   . Endometriosis   . Fatigue   . GERD (gastroesophageal reflux disease)   . Hiatal hernia   . History of Ostium Secundum Atrial Septal Defect 1989   Status post repair in 1989/1990  . Hx of migraines   . IBS (irritable bowel syndrome)   . Incontinence   . Intestinal volvulus [facility]) May 2007  . Loss of appetite   . Monoclonal B-cell lymphocytosis   . Onychomycosis   . Osteopenia of femoral neck 02/24/2020  . Sleep apnea    uses mouthguard  . Syncope 11/07/2019  . Varicose veins     Past Surgical History:  Procedure Laterality Date  . ABDOMINAL HYSTERECTOMY  2004  . ASD REPAIR, SECUNDUM  1990   Dr. Servando Snare  . BALLOON SINUPLASTY Left   . COLONOSCOPY    . ENDOVENOUS ABLATION SAPHENOUS VEIN W/ LASER Left 09/03/2020   EVLA  LSSV  and stab phlebectomy  10-20  . EYE SURGERY     muscle reattachment 02/2017  . hysterectomy for severe endometriosis    . NASAL SEPTUM SURGERY     Dr. Georgia Lopes  . OPEN REDUCTION INTERNAL FIXATION (ORIF) DISTAL RADIAL FRACTURE Right 06/20/2017    Procedure: OPEN REDUCTION INTERNAL FIXATION (ORIF) DISTAL RADIAL FRACTURE;  Surgeon: Leanora Cover, MD;  Location: [place];  Service: Orthopedics;  Laterality: Right;  . right ankle surgery  2010  . right leg fracture with surgery     done with ankle surgery  . TRANSTHORACIC ECHOCARDIOGRAM   January 2012   Normal LV size and function, EF greater than 55%. Mild LA dilation. No intra-atrial shunt with bubble study. Normal pulmonary pressures. Only trace to mild mitral and tricuspid regurgitation.  . TRANSTHORACIC ECHOCARDIOGRAM  11/07/2019   EF 60 to 55%.  LVH.  GR 1 DD, with moderate LA dilation.  Relatively normal valves.  RA pressure estimated 8 mmHg.  [name] UPPER GASTROINTESTINAL ENDOSCOPY    . varicose laser vein surgery  2009    Family History  Problem Relation Age of Onset  . Diabetes Mother   . Colon cancer Paternal Grandfather   . Heart disease Maternal Grandfather   . Prostate cancer Maternal Grandfather   . Lung cancer Maternal Grandmother        with mets to the brain  . Clotting disorder Maternal Grandmother   . Clotting disorder Sister   . Heart disease Sister   .  Colon polyps Neg Hx   . Esophageal cancer Neg Hx   . Rectal cancer Neg Hx   . Stomach cancer Neg Hx     Social History   Socioeconomic History  . Marital status: Married    Spouse name: Richard  . Number of children: Not on file  . Years of education: Not on file  . Highest education level: Not on file  Occupational History  . Occupation: remodel houses    Employer: [employer]  Tobacco Use  . Smoking status: Former Smoker    Packs/day: 1.00    Years: 10.00    Pack years: 10.00    Types: Cigarettes    Quit date: 12/12/1988    Years since quitting: 32.4  . Smokeless tobacco: Never Used  Vaping Use  . Vaping Use: Never used  Substance and Sexual Activity  . Alcohol use: Yes    Comment: social use/wine rare  . Drug use: No  . Sexual activity: Not on file  Other Topics  Concern  . Not on file  Social History Narrative   They have 2 children and at least two grandchildren. She does all her activities of daily living. She works as a Midwife for [employer].     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 She does other days of the gym and she is able to at least 60 minutes a day 5 days a week.   She is a former smoker who quit in 1990. This was after smoking a pack a day for 10 years. She drinks social alcohol on occasion.   Social Determinants of Health   Financial Resource Strain: Not on file  Food Insecurity: Not on file  Transportation Needs: Not on file  Physical Activity: Not on file  Stress: Not on file  Social Connections: Not on file  Intimate Partner Violence: Not on file    Outpatient Medications Prior to Visit  Medication Sig Dispense Refill  . acetaminophen (TYLENOL) 500 MG tablet Take 1,000 mg by mouth every 6 (six) hours as needed for mild pain.    . Ascorbic Acid (VITA-C PO) Take by mouth.    [name] b complex vitamins capsule Take 1 capsule by mouth daily.    . Calcium Carb-Cholecalciferol 600-800 MG-UNIT TABS Take 1 tablet by mouth 2 (two) times daily.    . cycloSPORINE (RESTASIS) 0.05 % ophthalmic emulsion Place 1 drop into both eyes 2 (two) times daily. 1.5 mL 5  . darifenacin (ENABLEX) 7.5 MG 24 hr tablet darifenacin ER 7.5 mg tablet,extended release 24 hr  Take 1 tablet every day by oral route.    . EPIPEN 2-PAK 0.3 MG/0.3ML SOAJ injection     . fluticasone (FLONASE) 50 MCG/ACT nasal spray instill 2 sprays into each nostril once daily (Patient taking differently: as needed.) 16 g 1  . levocetirizine (XYZAL) 5 MG tablet Take 5 mg by mouth every evening.     [name] LINZESS 145 MCG CAPS capsule TAKE 1 CAPSULE (145 MCG TOTAL) BY MOUTH DAILY BEFORE BREAKFAST. 90 capsule 1  . magnesium oxide (MAG-OX) 400 MG tablet Take 400 mg by mouth daily.    .  Multiple Vitamin (MULTIVITAMINS PO) Take 1 tablet by mouth daily.    . polyethylene glycol (MIRALAX / GLYCOLAX) packet Take 17 g by mouth daily.    . valACYclovir (VALTREX) 500 MG tablet  TAKE ONE TABLET BY MOUTH ONE TIME DAILY 90 tablet 3  . albuterol (VENTOLIN HFA) 108 (90 Base) MCG/ACT inhaler INHALE 1-2 PUFFS BY MOUTH EVERY 6 HOURS AS NEEDED FOR WHEEZE OR SHORTNESS OF BREATH 6.7 each 2  . montelukast (SINGULAIR) 10 MG tablet Take 10 mg by mouth at bedtime.     No facility-administered medications prior to visit.    Allergies  Allergen Reactions  . Chlordiazepoxide-Clidinium Other (See Comments)    "loopy"  . Codeine Nausea And Vomiting    Dizziness  . Penicillins Hives    ROS Review of Systems  Constitutional: Negative.   HENT: Negative.   Eyes: Negative for photophobia and visual disturbance.  Respiratory: Negative.   Cardiovascular: Negative.   Gastrointestinal: Positive for abdominal pain and constipation.  Genitourinary: Negative.   Musculoskeletal: Positive for back pain and myalgias.  Neurological: Negative for weakness and numbness.  Psychiatric/Behavioral: Negative.       Objective:    Physical Exam Vitals and nursing note reviewed.  Constitutional:      General: She is not in acute distress.    Appearance: Normal appearance. She is not ill-appearing, toxic-appearing or diaphoretic.  HENT:     Head: Normocephalic and atraumatic.     Right Ear: External ear normal.     Left Ear: External ear normal.  Eyes:     General: No scleral icterus.       Right eye: No discharge.        Left eye: No discharge.     Conjunctiva/sclera: Conjunctivae normal.  Cardiovascular:     Rate and Rhythm: Normal rate and regular rhythm.  Pulmonary:     Effort: Pulmonary effort is normal.     Breath sounds: Normal breath sounds.  Abdominal:     General: Abdomen is flat. Bowel sounds are normal. There is no distension.     Palpations: Abdomen is soft. There is no mass.      Tenderness: There is no abdominal tenderness. There is no guarding or rebound.     Hernia: No hernia is present.  Musculoskeletal:     Thoracic back: Spasms present. No tenderness or bony tenderness. Decreased range of motion. Scoliosis present.     Lumbar back: Spasms, tenderness and bony tenderness (L5?) present. Decreased range of motion. Negative right straight leg raise test and negative left straight leg raise test. Scoliosis present.  Skin:    General: Skin is warm and dry.  Neurological:     Mental Status: She is alert and oriented to person, place, and time.  Psychiatric:        Mood and Affect: Mood normal.        Behavior: Behavior normal.     BP 108/64   Pulse 65   Temp (!) 97.2 F (36.2 C) (Temporal)   Ht 5\' 10"  (1.778 m)   Wt 172 lb 9.6 oz (78.3 kg)   SpO2 98%   BMI 24.77 kg/m  Wt Readings from Last 3 Encounters:  05/06/21 172 lb 9.6 oz (78.3 kg)  04/22/21 176 lb 8 oz (80.1 kg)  03/08/21 174 lb (78.9 kg)     Health Maintenance Due  Topic Date Due  . Zoster Vaccines- Shingrix (1 of 2) 10/30/2009  . PAP SMEAR-Modifier  05/10/2021    There are no preventive care reminders to display for this patient.  Lab Results  Component Value Date   TSH 2.50 03/05/2020   Lab Results  Component Value Date   WBC 11.6 (H)  02/01/2021   HGB 14.4 02/01/2021   HCT 43.4 02/01/2021   MCV 94.0 02/01/2021   PLT 308.0 02/01/2021   Lab Results  Component Value Date   NA 141 04/23/2020   K 3.9 04/23/2020   CO2 31 04/23/2020   GLUCOSE 119 (H) 04/23/2020   BUN 19 04/23/2020   CREATININE 0.81 04/23/2020   BILITOT 0.5 04/23/2020   ALKPHOS 59 04/23/2020   AST 21 04/23/2020   ALT 21 04/23/2020   PROT 6.6 04/23/2020   ALBUMIN 4.5 04/23/2020   CALCIUM 9.8 04/23/2020   ANIONGAP 7 04/23/2020   GFR 80.15 06/26/2019   Lab Results  Component Value Date   CHOL 171 06/26/2020   Lab Results  Component Value Date   HDL 43.40 06/26/2020   Lab Results  Component Value Date    LDLCALC 109 (H) 06/26/2020   Lab Results  Component Value Date   TRIG 90.0 06/26/2020   Lab Results  Component Value Date   CHOLHDL 4 06/26/2020   Lab Results  Component Value Date   HGBA1C 5.5 02/01/2021      Assessment & Plan:   Problem List Items Addressed This Visit   None   Visit Diagnoses    Strain of lumbar region, initial encounter    -  Primary   Relevant Medications   ketorolac (TORADOL) injection 60 mg (Completed)   methocarbamol (ROBAXIN-750) 750 MG tablet   Other Relevant Orders   DG Lumbar Spine Complete   Thoracic myofascial strain, initial encounter       Relevant Medications   ketorolac (TORADOL) injection 60 mg (Completed)   methocarbamol (ROBAXIN-750) 750 MG tablet   Other Relevant Orders   DG Thoracic Spine W/Swimmers      Meds ordered this encounter  Medications  . ketorolac (TORADOL) injection 60 mg  . methocarbamol (ROBAXIN-750) 750 MG tablet    Sig: Take 1 tablet (750 mg total) by mouth every 6 (six) hours as needed for muscle spasms.    Dispense:  60 tablet    Refill:  0    Follow-up: Return if symptoms worsen or fail to improve.   Will use Tylenol and Robaxin as needed.  Follow-up if not improving in the next week or so. Libby Maw, MD

## 2021-05-13 ENCOUNTER — Encounter: Payer: Self-pay | Admitting: Family Medicine

## 2021-05-13 DIAGNOSIS — M419 Scoliosis, unspecified: Secondary | ICD-10-CM

## 2021-05-13 DIAGNOSIS — S39012A Strain of muscle, fascia and tendon of lower back, initial encounter: Secondary | ICD-10-CM

## 2021-05-13 NOTE — Telephone Encounter (Signed)
Hi Ms Candace, Lawson that you are feeling better. I had ordered the xrays to be taken at [facility] on [address] close to [facility]. Normally we also do xrays here but our tech is out for a while. I agree, please have them done. Sorry for the mix up.

## 2021-05-21 ENCOUNTER — Telehealth: Payer: Self-pay | Admitting: Family Medicine

## 2021-05-21 NOTE — Telephone Encounter (Signed)
Pt is trying to get her CPE scheduled for this year and she is due for her next after 06/26/21. Dr Bryan Lemma doesn't have anything left through the rest of her schedule for physicals and we aren't allowed to tell pts yet about her leaving. She asked if she can just schedule a CPE with Dr Ethelene Hal, Is this okay?

## 2021-05-25 ENCOUNTER — Ambulatory Visit: Payer: Managed Care, Other (non HMO) | Admitting: Family Medicine

## 2021-05-25 ENCOUNTER — Other Ambulatory Visit: Payer: Self-pay

## 2021-05-25 VITALS — BP 108/74 | HR 62 | Ht 70.0 in | Wt 178.2 lb

## 2021-05-25 DIAGNOSIS — M546 Pain in thoracic spine: Secondary | ICD-10-CM

## 2021-05-25 DIAGNOSIS — M62830 Muscle spasm of back: Secondary | ICD-10-CM | POA: Diagnosis not present

## 2021-05-25 NOTE — Patient Instructions (Signed)
Thank you for coming in today.   I've referred you to Physical Therapy.  Let us know if you don't hear from them in one week.   Let me know if you ae not doing ok.   If this happens again we can get you in quickly.   TENS UNIT: This is helpful for muscle pain and spasm.   Search and Purchase a TENS 7000 2nd edition at  www.tenspros.com or www.Cook.com It should be less than $30.     TENS unit instructions: Do not shower or bathe with the unit on Turn the unit off before removing electrodes or batteries If the electrodes lose stickiness add a drop of water to the electrodes after they are disconnected from the unit and place on plastic sheet. If you continued to have difficulty, call the TENS unit company to purchase more electrodes. Do not apply lotion on the skin area prior to use. Make sure the skin is clean and dry as this will help prolong the life of the electrodes. After use, always check skin for unusual red areas, rash or other skin difficulties. If there are any skin problems, does not apply electrodes to the same area. Never remove the electrodes from the unit by pulling the wires. Do not use the TENS unit or electrodes other than as directed. Do not change electrode placement without consultating your therapist or physician. Keep 2 fingers with between each electrode. Wear time ratio is 2:1, on to off times.    For example on for 30 minutes off for 15 minutes and then on for 30 minutes off for 15 minutes    Heating pad.

## 2021-05-25 NOTE — Progress Notes (Signed)
I, Peterson Lombard, LAT, ATC acting as a scribe for Lynne Leader, MD.  Subjective:    I'm seeing this patient as a consultation for: Dr. Abelino Derrick. Note will be routed back to referring provider/PCP.  CC: Thoracic and lumbar back pain  HPI: Pt is a 60 y/o female c/o thoracic and LBP ongoing since 05/05/21. MOI: Pt is unsure, but had been doing a lot of bending and stooping trying to get a beach condo ready for the summer. Pt has R-side scoliotic curvature and mild thoracic kyphosis. Pt is wanting to get back into her exercise routine and used to teach water aerobics. Pt locates pain to R illiac crest region and R-side of low back.  Radiating pain: yes- into bilat lateral hips LE numbness/tingling: sometimes- bilat anterior thighs LE weakness: no Aggravates: inactivity Treatments tried: stretching,  Dx imaging: 05/06/21 T-spine & L-spine XR  Past medical history, Surgical history, Family history, Social history, Allergies, and medications have been entered into the medical record, reviewed.   Review of Systems: No new headache, visual changes, nausea, vomiting, diarrhea, constipation, dizziness, abdominal pain, skin rash, fevers, chills, night sweats, weight loss, swollen lymph nodes, body aches, joint swelling, muscle aches, chest pain, shortness of breath, mood changes, visual or auditory hallucinations.   Objective:    Vitals:   05/25/21 1447  BP: 108/74  Pulse: 62  SpO2: 97%   General: Well Developed, well nourished, and in no acute distress.  Neuro/Psych: Alert and oriented x3, extra-ocular muscles intact, able to move all 4 extremities, sensation grossly intact. Skin: Warm and dry, no rashes noted.  Respiratory: Not using accessory muscles, speaking in full sentences, trachea midline.  Cardiovascular: Pulses palpable, no extremity edema. Abdomen: Does not appear distended. MSK: T-spine mild kyphosis otherwise normal-appearing L-spine minimal scoliosis.  Otherwise  normal. Nontender spinal midline T and L-spine.  Mildly tender palpation paraspinal musculature.  Normal lumbar motion. Lower extremity extremity strength is intact. Reflex are intact distally.  Lab and Radiology Results DG Thoracic Spine W/Swimmers  Result Date: 05/14/2021 CLINICAL DATA:  Back pain EXAM: THORACIC SPINE - 3 VIEWS COMPARISON:  11/07/2019 FINDINGS: There is no evidence of thoracic spine fracture. Scoliotic curvature, convex to the right within the lower thoracic spine. Mildly exaggerated thoracic kyphosis. Thoracic intervertebral disc heights are preserved. No other significant bone abnormalities are identified. IMPRESSION: No acute findings. Scoliosis and mildly exaggerated thoracic kyphosis. No significant degenerative findings. Electronically Signed   By: Davina Poke D.O.   On: 05/14/2021 11:45   DG Lumbar Spine Complete  Result Date: 05/14/2021 CLINICAL DATA:  Back pain EXAM: LUMBAR SPINE - COMPLETE 4+ VIEW COMPARISON:  None. FINDINGS: Five lumbar type vertebral segments. Vertebral body heights are maintained. No fracture identified. Mild lumbar levocurvature. Trace retrolisthesis of L2 on L3. Multilevel intervertebral disc space loss most pronounced at L3-4 and L5-S1. Mild lower lumbar facet arthrosis. IMPRESSION: Mild-to-moderate multilevel lumbar spondylosis. Electronically Signed   By: Davina Poke D.O.   On: 05/14/2021 11:44   I, Lynne Leader, personally (independently) visualized and performed the interpretation of the images attached in this note.   Impression and Recommendations:    Assessment and Plan: 60 y.o. female with pain T-spine L-spine.  Pain predominantly due to perispinal muscle dysfunction and spasm.  Patient also has degenerative changes in both the T and L-spine that could be contributory.  However main treatment should be physical therapy to work on core strengthening.  Plan for PT.  Additionally recommend heating pad and TENS  unit.  Certainly can use  muscle relaxers as needed.  Recheck as needed.[name]  PDMP not reviewed this encounter. Orders Placed This Encounter  Procedures   Ambulatory referral to Physical Therapy    Referral Priority:   Routine    Referral Type:   Physical Medicine    Referral Reason:   Specialty Services Required    Requested Specialty:   Physical Therapy    Number of Visits Requested:   1   No orders of the defined types were placed in this encounter.   Discussed warning signs or symptoms. Please see discharge instructions. Patient expresses understanding.   The above documentation has been reviewed and is accurate and complete Lynne Leader, M.D.

## 2021-05-26 ENCOUNTER — Ambulatory Visit: Payer: Managed Care, Other (non HMO) | Attending: Family Medicine

## 2021-05-26 DIAGNOSIS — M6281 Muscle weakness (generalized): Secondary | ICD-10-CM | POA: Diagnosis present

## 2021-05-26 DIAGNOSIS — M545 Low back pain, unspecified: Secondary | ICD-10-CM | POA: Diagnosis present

## 2021-05-26 DIAGNOSIS — R293 Abnormal posture: Secondary | ICD-10-CM

## 2021-05-26 DIAGNOSIS — M25512 Pain in left shoulder: Secondary | ICD-10-CM | POA: Diagnosis present

## 2021-05-26 DIAGNOSIS — M546 Pain in thoracic spine: Secondary | ICD-10-CM | POA: Diagnosis present

## 2021-05-26 NOTE — Patient Instructions (Signed)
Access Code: [email or web address] URL: [email or web address] Date: 05/26/2021 Prepared by: Sherlynn Stalls  Exercises Supine ITB Stretch with Strap - 1 x daily - 7 x weekly - 4 sets - 20-30 seconds hold Supine Hamstring Stretch with Strap - 1 x daily - 7 x weekly - 4 sets - 20-30 seconds hold Supine Figure 4 Piriformis Stretch - 1 x daily - 7 x weekly - 4 sets - 20-30 seconds hold Supine Double Knee to Chest - 1 x daily - 7 x weekly - 3 sets - 20-30 seconds hold Supine Bridge - 1 x daily - 7 x weekly - 2 sets - 10 reps Standing Bilateral Low Shoulder Row with Anchored Resistance - 1 x daily - 7 x weekly - 2 sets - 10 reps

## 2021-05-26 NOTE — Therapy (Signed)
[facility] [place], [place], [address] Phone: [phone]   Fax:  [phone]  Physical Therapy Evaluation  Patient Details  Name: [name] MRN: [id number] Date of Birth: 16-Dec-1960 Referring Provider (PT): Georgina Snell   Encounter Date: 05/26/2021   PT End of Session - 05/26/21 1424     Visit Number 1    Date for PT Re-Evaluation 07/21/21    Authorization Type Cign[employer]    PT Start Time 1345    PT Stop Time 1423    PT Time Calculation (min) 38 min    Activity Tolerance Patient tolerated treatment well    Behavior During Therapy [facility] for tasks assessed/performed             Past Medical History:  Diagnosis Date   Allergic rhinitis    Allergy    dust   Anxiety    Back pain    Contact lens/glasses fitting    Depression    Endometriosis    Fatigue    GERD (gastroesophageal reflux disease)    Hiatal hernia    History of Ostium Secundum Atrial Septal Defect 1989   Status post repair in 1989/1990   Hx of migraines    IBS (irritable bowel syndrome)    Incontinence    Intestinal volvulus ([place]) May 2007   Loss of appetite    Monoclonal B-cell lymphocytosis    Onychomycosis    Osteopenia of femoral neck 02/24/2020   Sleep apnea    uses mouthguard   Syncope 11/07/2019   Varicose veins     Past Surgical History:  Procedure Laterality Date   ABDOMINAL HYSTERECTOMY  2004   ASD REPAIR, SECUNDUM  1990   Dr. Jackie Plum SINUPLASTY Left    COLONOSCOPY     ENDOVENOUS ABLATION SAPHENOUS VEIN W/ LASER Left 09/03/2020   EVLA  LSSV  and stab phlebectomy  10-20   EYE SURGERY     muscle reattachment 02/2017   hysterectomy for severe endometriosis     NASAL SEPTUM SURGERY     Dr. Georgia Lopes   OPEN REDUCTION INTERNAL FIXATION (ORIF) DISTAL RADIAL FRACTURE Right 06/20/2017   Procedure: OPEN REDUCTION INTERNAL FIXATION (ORIF) DISTAL RADIAL FRACTURE;  Surgeon: Leanora Cover, MD;  Location: [place];  Service: Orthopedics;  Laterality: Right;   right ankle surgery  2010   right leg fracture with surgery     done with ankle surgery   TRANSTHORACIC ECHOCARDIOGRAM   January 2012   Normal LV size and function, EF greater than 55%. Mild LA dilation. No intra-atrial shunt with bubble study. Normal pulmonary pressures. Only trace to mild mitral and tricuspid regurgitation.   TRANSTHORACIC ECHOCARDIOGRAM  11/07/2019   EF 60 to 55%.  LVH.  GR 1 DD, with moderate LA dilation.  Relatively normal valves.  RA pressure estimated 8 mmHg.   UPPER GASTROINTESTINAL ENDOSCOPY     varicose laser vein surgery  2009    There were no vitals filed for this visit.    Subjective Assessment - 05/26/21 1338     Subjective 60 y/o female c/o thoracic and LBP ongoing since 05/05/21. MOI: Pt is unsure, but had been doing a lot of bending and stooping trying to get a beach condo ready for the summer. had a big spasm in the middle of the night. Pt has R-side scoliotic curvature and mild thoracic kyphosis, history of back pain. She wants to get back into her exercise routine  and used to teach water aerobics up until pandemic. pain located to R illiac crest and R-side of low back.   Radiating pain into bilat lateral hips, occasional tingling that is light into front of thigh. tries to stretch ever morning which helps    Pertinent History R sided scoliosis, thoracic kyphosis, hx of back pain.    How long can you sit comfortably? unlimited  but exacerbates when going to get up.    Diagnostic tests recent xrays of spine revealing R scoliosis, kyphosis, and some lumbar spondylosis    Patient Stated Goals get back to exercise, activity    Currently in Pain? Yes    Pain Score 3     Pain Location Back    Pain Orientation Mid;Lower;Right    Pain Descriptors / Indicators Aching;Dull    Pain Type Acute pain    Pain Radiating Towards posteiror hips, ant thighs    Pain Onset 1 to 4 weeks ago    Aggravating Factors   standing up after sitting, certian movements.    Pain Relieving Factors ms relaxers, heat, stetching, tylenol                [facility] PT Assessment - 05/26/21 1337       Assessment   Medical Diagnosis M62.830 (ICD-10-CM) - Lumbar paraspinal muscle spasm  M54.6 (ICD-10-CM) - Pain in thoracic spine    Referring Provider (PT) Georgina Snell    Hand Dominance Right    Prior Therapy also seeing [facility]      Balance Screen   Has the patient fallen in the past 6 months No    Has the patient had a decrease in activity level because of a fear of falling?  No    Is the patient reluctant to leave their home because of a fear of falling?  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 Lateral flexion 75% B with tightness R LBP. Flexion 50% limited by pain on the right low back and B HS tightness. Extension 25% with pain and tightnes B.  Left shoulder ROM 75% for flexion and ABD with end range pain.      Strength   Overall Strength Comments LLE grossly 4-/5. RLE grossly 4-/5 with back pain.      Flexibility   Soft Tissue Assessment /Muscle Length yes    Hamstrings leftpopliteal angle to 60 deg. right to 55 deg    ITB very tight B    Piriformis Very tight B      Palpation   Palpation comment General spinal hypomobility. some TTP at the thoracic  paraspinals                        Objective  measurements completed on examination: See above findings.               PT Education - 05/26/21 1423     Education Details PT initial POC and HEP. Education on gradual progression with physical activity based on symptoms. Initial HEP with focus on address proximal hip/low back tightness and initiaing postural strength. Access Code: [email or web address]    Person(s) Educated Patient    Methods Explanation;Demonstration;Handout    Comprehension Verbalized understanding;Returned demonstration              PT Short Term Goals - 05/26/21 1435       PT SHORT TERM GOAL #1   Title Ind with initial HEP    Time 2      PT SHORT TERM GOAL #2   Title FOTO to be completed and appropriate LTG written for functional back improvements.    Time 2    Period Weeks    Target Date 06/09/21               PT Long Term Goals - 05/26/21 1435       PT LONG TERM GOAL #1   Title Independent with advanced HEP and safe progression of independent home/gym exercises    Time 8    Period Weeks    Status New    Target Date 07/21/21      PT LONG TERM GOAL #2   Title Pt will demonstrate full and symmetrical thoracolumbar ROM with </=1/10 back pain    Time 8    Period Weeks    Status New    Target Date 07/21/21      PT LONG TERM GOAL #3   Title BLE strength to at least 4+/5 without exacerbation of pain    Time 8    Period Weeks    Target Date 07/21/21      PT LONG TERM GOAL #4   Title Improved LE flexibiltiy to mild deficits with little to no pain    Time 8    Period Weeks                    Plan - 05/26/21 1425     Clinical Impression Statement Pt is a 60 yo female who presents with recent history of thoracic and lumbar mms spasms on 05/05/21 with some back pain radiating into posterior hips and anterior thighs since. PMH significant for scoliosis and kyphosis, incontinence for which she is seeing pelvic floor  physical therapy. [name] currently presents with very tight hip muscles (R side mildly tighter than left), decreased core and postural strength, and limited thoracolumbar ROM.  She also reports recently experiencing an exacerbation of anterior left shoulder pain after an exercise which caused sharp pain,  left shoulder ROM grossly 75% compared to right. Pt previously very active and would like to get back to independent exercise and physical activities without pain. Pt will benefit from skilled physical therapy to address aforementioned impairments, decrease pain, and progress back to PLOF.    Examination-Activity Limitations Bend;Lift    Stability/Clinical Decision Making Stable/Uncomplicated    Clinical Decision Making Moderate    Rehab Potential Good    PT Frequency 1x / week    PT Duration 8 weeks   d/t high copay and yearly visit limit, Plan to reassess at 4 weeks (continue 1x/wk, decr frequency to biweekly, or discharge depending on progress)   PT Treatment/Interventions ADLs/Self Care Home Management;Cryotherapy;Electrical Stimulation;Iontophoresis 4mg /ml Dexamethasone;Moist Heat;Traction;Gait training;Neuromuscular re-education;Therapeutic exercise;Therapeutic  activities;Functional mobility training;Patient/family education;Manual techniques;Dry needling;Taping    PT Next Visit Plan Back FOTO. Reassess HEP. Gradual progression of spinal and LE flexibility as tolerated. Progressive strengthening for extremities, core stability, and postural mms as tolerated.  manual and modalities as needed.    PT Home Exercise Plan Access Code: [email or web address]    Consulted and Agree with Plan of Care Patient             Patient will benefit from skilled therapeutic intervention in order to improve the following deficits and impairments:  Increased muscle spasms, Pain, Hypomobility, Impaired flexibility, Decreased mobility, Decreased strength, Postural dysfunction  Visit Diagnosis: Abnormal posture  Muscle  weakness (generalized)  Pain in thoracic spine  Low back pain, unspecified back pain laterality, unspecified chronicity, unspecified whether sciatica present  Acute pain of left shoulder     Problem List Patient Active Problem List   Diagnosis Date Noted   Prediabetes 08/12/2020   Preop cardiovascular exam 11/28/2019   Nonsustained ventricular tachycardia ([place]) 11/27/2019   Hospital discharge follow-up 11/11/2019   Hypotension 11/11/2019   Hypokalemia    Pain of upper abdomen    Syncope and collapse 11/07/2019   Bradycardia on ECG 11/07/2019   Upper respiratory infection 10/03/2019   Monoclonal B-cell lymphocytosis 09/20/2019   Neutropenia (HCC) 09/01/2019   Allergic rhinitis    OSA (obstructive sleep apnea) 02/20/2019   HSV (herpes simplex virus) anogenital infection 08/31/2018   Chronic constipation 05/31/2018   Eczema 01/09/2016   GERD (gastroesophageal reflux disease)-probable paroxsysmal relaxation LES 03/14/2012   Hair loss 06/29/2011   GERD 02/28/2011   VARICOSE VEINS, LOWER EXTREMITIES 01/08/2008   INTESTINAL VOLVULUS, LARGE BOWEL 01/08/2008   Anxiety 01/07/2008   IBS (irritable bowel syndrome) 01/07/2008   MIGRAINES, HX OF 01/07/2008   ATRIAL SEPTAL DEFECT - Status Post Repair 01/08/1988    Hall Busing, PT, DPT 05/26/2021, 3:13 PM  [facility] [place], [place], [address] Phone: [phone]   Fax:  [phone]  Name: [name] MRN: [id number] Date of Birth: 1961-08-14

## 2021-06-07 ENCOUNTER — Encounter: Payer: Self-pay | Admitting: Physical Therapy

## 2021-06-07 ENCOUNTER — Other Ambulatory Visit: Payer: Self-pay

## 2021-06-07 ENCOUNTER — Ambulatory Visit: Payer: Managed Care, Other (non HMO) | Admitting: Physical Therapy

## 2021-06-07 DIAGNOSIS — M6281 Muscle weakness (generalized): Secondary | ICD-10-CM

## 2021-06-07 DIAGNOSIS — M546 Pain in thoracic spine: Secondary | ICD-10-CM

## 2021-06-07 DIAGNOSIS — R293 Abnormal posture: Secondary | ICD-10-CM | POA: Diagnosis not present

## 2021-06-07 DIAGNOSIS — M545 Low back pain, unspecified: Secondary | ICD-10-CM

## 2021-06-07 DIAGNOSIS — M25512 Pain in left shoulder: Secondary | ICD-10-CM

## 2021-06-07 NOTE — Therapy (Signed)
[facility] [place], [place], [address] Phone: [phone]   Fax:  [phone]  Physical Therapy Treatment  Patient Details  Name: TYLASIA FLETCHALL MRN: [id number] Date of Birth: 1961/10/04 Referring Provider (PT): Georgina Snell   Encounter Date: 06/07/2021   PT End of Session - 06/07/21 1555     Visit Number 2    Date for PT Re-Evaluation 07/21/21    Authorization Type Cigna    PT Start Time 1515    PT Stop Time 1556    PT Time Calculation (min) 41 min    Activity Tolerance Patient tolerated treatment well    Behavior During Therapy [facility] for tasks assessed/performed             Past Medical History:  Diagnosis Date   Allergic rhinitis    Allergy    dust   Anxiety    Back pain    Contact lens/glasses fitting    Depression    Endometriosis    Fatigue    GERD (gastroesophageal reflux disease)    Hiatal hernia    History of Ostium Secundum Atrial Septal Defect 1989   Status post repair in 1989/1990   Hx of migraines    IBS (irritable bowel syndrome)    Incontinence    Intestinal volvulus ([place]) May 2007   Loss of appetite    Monoclonal B-cell lymphocytosis    Onychomycosis    Osteopenia of femoral neck 02/24/2020   Sleep apnea    uses mouthguard   Syncope 11/07/2019   Varicose veins     Past Surgical History:  Procedure Laterality Date   ABDOMINAL HYSTERECTOMY  2004   ASD REPAIR, SECUNDUM  1990   Dr. Jackie Plum SINUPLASTY Left    COLONOSCOPY     ENDOVENOUS ABLATION SAPHENOUS VEIN W/ LASER Left 09/03/2020   EVLA  LSSV  and stab phlebectomy  10-20   EYE SURGERY     muscle reattachment 02/2017   hysterectomy for severe endometriosis     NASAL SEPTUM SURGERY     Dr. Georgia Lopes   OPEN REDUCTION INTERNAL FIXATION (ORIF) DISTAL RADIAL FRACTURE Right 06/20/2017   Procedure: OPEN REDUCTION INTERNAL FIXATION (ORIF) DISTAL RADIAL FRACTURE;  Surgeon: Leanora Cover, MD;  Location: [place];  Service: Orthopedics;  Laterality: Right;   right ankle surgery  2010   right leg fracture with surgery     done with ankle surgery   TRANSTHORACIC ECHOCARDIOGRAM   January 2012   Normal LV size and function, EF greater than 55%. Mild LA dilation. No intra-atrial shunt with bubble study. Normal pulmonary pressures. Only trace to mild mitral and tricuspid regurgitation.   TRANSTHORACIC ECHOCARDIOGRAM  11/07/2019   EF 60 to 55%.  LVH.  GR 1 DD, with moderate LA dilation.  Relatively normal valves.  RA pressure estimated 8 mmHg.   UPPER GASTROINTESTINAL ENDOSCOPY     varicose laser vein surgery  2009    There were no vitals filed for this visit.   Subjective Assessment - 06/07/21 1513     Subjective Pt is doing well. She is having pain in her right hip mostly when she is getting in and out of the car.    Currently in Pain? Yes    Pain Score 4     Pain Location Hip    Pain Orientation Right    Aggravating Factors  getting in and out of car    Pain Relieving Factors  movement, stretching                               [facility] Adult PT Treatment/Exercise - 06/07/21 0001       Exercises   Exercises Lumbar      Lumbar Exercises: Stretches   Passive Hamstring Stretch 2 reps;30 seconds;Left;Right   Hamstring and ITB stretch   Double Knee to Chest Stretch 2 reps;30 seconds    Lower Trunk Rotation 2 reps;30 seconds    Piriformis Stretch 2 reps;30 seconds;Right;Left      Lumbar Exercises: Aerobic   Nustep L4x6 min      Lumbar Exercises: Standing   Row 20 reps;Theraband;Both    Theraband Level (Row) Level 2 (Red)    Shoulder Extension Both;20 reps;Theraband    Theraband Level (Shoulder Extension) Level 2 (Red)    Other Standing Lumbar Exercises 8      Lumbar Exercises: Seated   Sit to Stand 20 reps;Other (comment)   1 set without yellow ball, 1 set with yellow ball OHP   Other Seated Lumbar Exercises diagonals with yellow ball 2x10      Lumbar Exercises:  Supine   Bridge 10 reps    Single Leg Bridge 20 reps    Other Supine Lumbar Exercises marching with red theraband  2x10      Lumbar Exercises: Sidelying   Clam 20 reps;Both   tactile cues for keeping hip forawrd                     PT Short Term Goals - 06/07/21 1554       PT SHORT TERM GOAL #1   Title Ind with initial HEP    Time 2    Status On-going      PT SHORT TERM GOAL #2   Title FOTO to be completed and appropriate LTG written for functional back improvements.    Time 2    Period Weeks    Status On-going               PT Long Term Goals - 05/26/21 1435       PT LONG TERM GOAL #1   Title Independent with advanced HEP and safe progression of independent home/gym exercises    Time 8    Period Weeks    Status New    Target Date 07/21/21      PT LONG TERM GOAL #2   Title Pt will demonstrate full and symmetrical thoracolumbar ROM with </=1/10 back pain    Time 8    Period Weeks    Status New    Target Date 07/21/21      PT LONG TERM GOAL #3   Title BLE strength to at least 4+/5 without exacerbation of pain    Time 8    Period Weeks    Target Date 07/21/21      PT LONG TERM GOAL #4   Title Improved LE flexibiltiy to mild deficits with little to no pain    Time 8    Period Weeks                   Plan - 06/07/21 1557     Clinical Impression Statement Pt continued to progress strengthening exercises for the core, back and hips. She is still having pain in her R hip that is relieved by movement. Pt's right side is tighter than her left while  doing stretches. She reports feeling well following treatment.    PT Treatment/Interventions ADLs/Self Care Home Management;Cryotherapy;Electrical Stimulation;Iontophoresis 4mg /ml Dexamethasone;Moist Heat;Traction;Gait training;Neuromuscular re-education;Therapeutic exercise;Therapeutic activities;Functional mobility training;Patient/family education;Manual techniques;Dry needling;Taping    PT  Next Visit Plan Back FOTO. Reassess HEP. Gradual progression of spinal and LE flexibility as tolerated. Progressive strengthening for extremities, core stability, and postural mms as tolerated.  manual and modalities as needed.    PT Home Exercise Plan Access Code: [email or web address]             Patient will benefit from skilled therapeutic intervention in order to improve the following deficits and impairments:  Increased muscle spasms, Pain, Hypomobility, Impaired flexibility, Decreased mobility, Decreased strength, Postural dysfunction  Visit Diagnosis: Abnormal posture  Muscle weakness (generalized)  Pain in thoracic spine  Low back pain, unspecified back pain laterality, unspecified chronicity, unspecified whether sciatica present  Acute pain of left shoulder     Problem List Patient Active Problem List   Diagnosis Date Noted   Prediabetes 08/12/2020   Preop cardiovascular exam 11/28/2019   Nonsustained ventricular tachycardia ([place]) 11/27/2019   Hospital discharge follow-up 11/11/2019   Hypotension 11/11/2019   Hypokalemia    Pain of upper abdomen    Syncope and collapse 11/07/2019   Bradycardia on ECG 11/07/2019   Upper respiratory infection 10/03/2019   Monoclonal B-cell lymphocytosis 09/20/2019   Neutropenia (HCC) 09/01/2019   Allergic rhinitis    OSA (obstructive sleep apnea) 02/20/2019   HSV (herpes simplex virus) anogenital infection 08/31/2018   Chronic constipation 05/31/2018   Eczema 01/09/2016   GERD (gastroesophageal reflux disease)-probable paroxsysmal relaxation LES 03/14/2012   Hair loss 06/29/2011   GERD 02/28/2011   VARICOSE VEINS, LOWER EXTREMITIES 01/08/2008   INTESTINAL VOLVULUS, LARGE BOWEL 01/08/2008   Anxiety 01/07/2008   IBS (irritable bowel syndrome) 01/07/2008   MIGRAINES, HX OF 01/07/2008   ATRIAL SEPTAL DEFECT - Status Post Repair 01/08/1988    Dawayne Cirri, SPTA 06/07/2021, 4:05 PM  [facility] [place], [place], [address] Phone: [phone]   Fax:  [phone]  Name: NICOLA HEINEMANN MRN: [id number] Date of Birth: 08-26-1961

## 2021-06-16 ENCOUNTER — Ambulatory Visit: Payer: Managed Care, Other (non HMO)

## 2021-06-30 ENCOUNTER — Ambulatory Visit: Payer: Managed Care, Other (non HMO)

## 2021-07-05 ENCOUNTER — Other Ambulatory Visit: Payer: Self-pay

## 2021-07-06 ENCOUNTER — Ambulatory Visit (INDEPENDENT_AMBULATORY_CARE_PROVIDER_SITE_OTHER): Payer: Managed Care, Other (non HMO) | Admitting: Family Medicine

## 2021-07-06 ENCOUNTER — Encounter: Payer: Self-pay | Admitting: Family Medicine

## 2021-07-06 VITALS — BP 100/60 | HR 63 | Temp 97.3°F | Ht 70.0 in | Wt 173.0 lb

## 2021-07-06 DIAGNOSIS — D72820 Lymphocytosis (symptomatic): Secondary | ICD-10-CM

## 2021-07-06 DIAGNOSIS — Z23 Encounter for immunization: Secondary | ICD-10-CM | POA: Diagnosis not present

## 2021-07-06 DIAGNOSIS — S39012A Strain of muscle, fascia and tendon of lower back, initial encounter: Secondary | ICD-10-CM | POA: Diagnosis not present

## 2021-07-06 DIAGNOSIS — S29019A Strain of muscle and tendon of unspecified wall of thorax, initial encounter: Secondary | ICD-10-CM | POA: Diagnosis not present

## 2021-07-06 DIAGNOSIS — Z0000 Encounter for general adult medical examination without abnormal findings: Secondary | ICD-10-CM

## 2021-07-06 LAB — CBC WITH DIFFERENTIAL/PLATELET
Basophils Absolute: 0.1 10*3/uL (ref 0.0–0.1)
Basophils Relative: 0.4 % (ref 0.0–3.0)
Eosinophils Absolute: 0.2 10*3/uL (ref 0.0–0.7)
Eosinophils Relative: 1.4 % (ref 0.0–5.0)
HCT: 42.7 % (ref 36.0–46.0)
Hemoglobin: 14 g/dL (ref 12.0–15.0)
Lymphocytes Relative: 63.1 % — ABNORMAL HIGH (ref 12.0–46.0)
Lymphs Abs: 8 10*3/uL — ABNORMAL HIGH (ref 0.7–4.0)
MCHC: 32.9 g/dL (ref 30.0–36.0)
MCV: 94.9 fl (ref 78.0–100.0)
Monocytes Absolute: 0.6 10*3/uL (ref 0.1–1.0)
Monocytes Relative: 4.9 % (ref 3.0–12.0)
Neutro Abs: 3.8 10*3/uL (ref 1.4–7.7)
Neutrophils Relative %: 30.2 % — ABNORMAL LOW (ref 43.0–77.0)
Platelets: 322 10*3/uL (ref 150.0–400.0)
RBC: 4.5 Mil/uL (ref 3.87–5.11)
RDW: 12.5 % (ref 11.5–15.5)
WBC: 12.7 10*3/uL — ABNORMAL HIGH (ref 4.0–10.5)

## 2021-07-06 LAB — URINALYSIS, ROUTINE W REFLEX MICROSCOPIC
Bilirubin Urine: NEGATIVE
Hgb urine dipstick: NEGATIVE
Ketones, ur: NEGATIVE
Leukocytes,Ua: NEGATIVE
Nitrite: NEGATIVE
Specific Gravity, Urine: 1.015 (ref 1.000–1.030)
Total Protein, Urine: NEGATIVE
Urine Glucose: NEGATIVE
Urobilinogen, UA: 0.2 (ref 0.0–1.0)
pH: 7.5 (ref 5.0–8.0)

## 2021-07-06 LAB — LIPID PANEL
Cholesterol: 151 mg/dL (ref 0–200)
HDL: 48.9 mg/dL (ref >39.00)
LDL Cholesterol: 86 mg/dL (ref 0–99)
NonHDL: 101.71
Total CHOL/HDL Ratio: 3
Triglycerides: 77 mg/dL (ref 0.0–149.0)
VLDL: 15.4 mg/dL (ref 0.0–40.0)

## 2021-07-06 LAB — COMPREHENSIVE METABOLIC PANEL
ALT: 20 U/L (ref 0–35)
AST: 21 U/L (ref 0–37)
Albumin: 4.2 g/dL (ref 3.5–5.2)
Alkaline Phosphatase: 60 U/L (ref 39–117)
BUN: 22 mg/dL (ref 6–23)
CO2: 29 mEq/L (ref 19–32)
Calcium: 9.4 mg/dL (ref 8.4–10.5)
Chloride: 104 mEq/L (ref 96–112)
Creatinine, Ser: 0.75 mg/dL (ref 0.40–1.20)
GFR: 85.77 mL/min (ref >60.00)
Glucose, Bld: 98 mg/dL (ref 70–99)
Potassium: 4.1 mEq/L (ref 3.5–5.1)
Sodium: 142 mEq/L (ref 135–145)
Total Bilirubin: 0.9 mg/dL (ref 0.2–1.2)
Total Protein: 6.6 g/dL (ref 6.0–8.3)

## 2021-07-06 MED ORDER — METHOCARBAMOL 750 MG PO TABS: 750.0000 mg | ORAL_TABLET | Freq: Four times a day (QID) | ORAL | 0 refills | Status: DC | PRN

## 2021-07-06 NOTE — Progress Notes (Addendum)
Established Patient Office Visit  Subjective:  Patient ID: [name], female    DOB: 1961/08/14  Age: 60 y.o. MRN: [id number]  CC:  Chief Complaint  Patient presents with   Annual Exam    CPE.     HPI [name] presents for a physical exam and follow-up of lymphocytosis.  Patient is doing well.  She feels well.  Denies any weight loss or night sweats.  She continues to work as a Archivist.  Was married in the middle of the [place] pandemic.  Things are well at home and at work.  She continues to stay active.  She has yearly mammograms and Pap smears through her GYN provider.  Last DEXA scan was last year.  It did show osteopenia.  Seeing her dentist twice yearly and having regular eye exams.  Agrees to have the pneumococcal 20 vaccine.  Kegel exercises have helped her urinary incontinence a great deal.  Past Medical History:  Diagnosis Date   Allergic rhinitis    Allergy    dust   Anxiety    Back pain    Contact lens/glasses fitting    Depression    Endometriosis    Fatigue    GERD (gastroesophageal reflux disease)    Hiatal hernia    History of Ostium Secundum Atrial Septal Defect 1989   Status post repair in 1989/1990   Hx of migraines    IBS (irritable bowel syndrome)    Incontinence    Intestinal volvulus ([place]) May 2007   Loss of appetite    Monoclonal B-cell lymphocytosis    Onychomycosis    Osteopenia of femoral neck 02/24/2020   Sleep apnea    uses mouthguard   Syncope 11/07/2019   Varicose veins     Past Surgical History:  Procedure Laterality Date   ABDOMINAL HYSTERECTOMY  2004   ASD REPAIR, SECUNDUM  1990   Dr. Jackie Plum SINUPLASTY Left    COLONOSCOPY     ENDOVENOUS ABLATION SAPHENOUS VEIN W/ LASER Left 09/03/2020   EVLA  LSSV  and stab phlebectomy  10-20   EYE SURGERY     muscle reattachment 02/2017   hysterectomy for severe endometriosis     NASAL SEPTUM SURGERY     Dr. Georgia Lopes   OPEN REDUCTION INTERNAL FIXATION  (ORIF) DISTAL RADIAL FRACTURE Right 06/20/2017   Procedure: OPEN REDUCTION INTERNAL FIXATION (ORIF) DISTAL RADIAL FRACTURE;  Surgeon: Leanora Cover, MD;  Location: [facility];  Service: Orthopedics;  Laterality: Right;   right ankle surgery  2010   right leg fracture with surgery     done with ankle surgery   TRANSTHORACIC ECHOCARDIOGRAM   January 2012   Normal LV size and function, EF greater than 55%. Mild LA dilation. No intra-atrial shunt with bubble study. Normal pulmonary pressures. Only trace to mild mitral and tricuspid regurgitation.   TRANSTHORACIC ECHOCARDIOGRAM  11/07/2019   EF 60 to 55%.  LVH.  GR 1 DD, with moderate LA dilation.  Relatively normal valves.  RA pressure estimated 8 mmHg.   UPPER GASTROINTESTINAL ENDOSCOPY     varicose laser vein surgery  2009    Family History  Problem Relation Age of Onset   Diabetes Mother    Colon cancer Paternal Grandfather    Heart disease Maternal Grandfather    Prostate cancer Maternal Grandfather    Lung cancer Maternal Grandmother        with mets to the brain   Clotting  disorder Maternal Grandmother    Clotting disorder Sister    Heart disease Sister    Colon polyps Neg Hx    Esophageal cancer Neg Hx    Rectal cancer Neg Hx    Stomach cancer Neg Hx     Social History   Socioeconomic History   Marital status: Married    Spouse name: Richard   Number of children: Not on file   Years of education: Not on file   Highest education level: Not on file  Occupational History   Occupation: remodel houses    Employer: [employer]  Tobacco Use   Smoking status: Former    Packs/day: 1.00    Years: 10.00    Pack years: 10.00    Types: Cigarettes    Quit date: 12/12/1988    Years since quitting: 32.5   Smokeless tobacco: Never  Vaping Use   Vaping Use: Never used  Substance and Sexual Activity   Alcohol use: Yes    Comment: social use/wine rare   Drug use: No   Sexual activity: Not on file  Other  Topics Concern   Not on file  Social History Narrative   They have 2 children and at least two grandchildren. She does all her activities of daily living. She works as a Midwife for [employer].     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 She does other days of the gym and she is able to at least 60 minutes a day 5 days a week.   She is a former smoker who quit in 1990. This was after smoking a pack a day for 10 years. She drinks social alcohol on occasion.   Social Determinants of Health   Financial Resource Strain: Not on file  Food Insecurity: Not on file  Transportation Needs: Not on file  Physical Activity: Not on file  Stress: Not on file  Social Connections: Not on file  Intimate Partner Violence: Not on file    Outpatient Medications Prior to Visit  Medication Sig Dispense Refill   acetaminophen (TYLENOL) 500 MG tablet Take 1,000 mg by mouth every 6 (six) hours as needed for mild pain.     Ascorbic Acid (VITA-C PO) Take by mouth.     b complex vitamins capsule Take 1 capsule by mouth daily.     Calcium Carb-Cholecalciferol 600-800 MG-UNIT TABS Take 1 tablet by mouth 2 (two) times daily.     cycloSPORINE (RESTASIS) 0.05 % ophthalmic emulsion Place 1 drop into both eyes 2 (two) times daily. 1.5 mL 5   EPIPEN 2-PAK 0.3 MG/0.3ML SOAJ injection      levocetirizine (XYZAL) 5 MG tablet Take 5 mg by mouth every evening.      LINZESS 145 MCG CAPS capsule TAKE 1 CAPSULE (145 MCG TOTAL) BY MOUTH DAILY BEFORE BREAKFAST. 90 capsule 1   magnesium oxide (MAG-OX) 400 MG tablet Take 400 mg by mouth daily.     Multiple Vitamin (MULTIVITAMINS PO) Take 1 tablet by mouth daily.     polyethylene glycol (MIRALAX / GLYCOLAX) packet Take 17 g by mouth daily.     valACYclovir (VALTREX) 500 MG tablet TAKE ONE TABLET BY MOUTH ONE TIME DAILY 90 tablet 3   fluticasone (FLONASE) 50 MCG/ACT nasal spray  instill 2 sprays into each nostril once daily (Patient taking differently: as needed.) 16 g 1  methocarbamol (ROBAXIN-750) 750 MG tablet Take 1 tablet (750 mg total) by mouth every 6 (six) hours as needed for muscle spasms. 60 tablet 0   darifenacin (ENABLEX) 7.5 MG 24 hr tablet darifenacin ER 7.5 mg tablet,extended release 24 hr  Take 1 tablet every day by oral route.     No facility-administered medications prior to visit.    Allergies  Allergen Reactions   Chlordiazepoxide-Clidinium Other (See Comments)    "loopy"   Codeine Nausea And Vomiting    Dizziness   Penicillins Hives    ROS Review of Systems  Constitutional: Negative.  Negative for diaphoresis, fatigue, fever and unexpected weight change.  HENT: Negative.    Eyes:  Negative for photophobia and visual disturbance.  Respiratory: Negative.    Cardiovascular: Negative.   Gastrointestinal: Negative.   Endocrine: Negative for polyphagia and polyuria.  Genitourinary:  Negative for difficulty urinating, dysuria and frequency.  Musculoskeletal:  Negative for gait problem and joint swelling.  Skin:  Negative for pallor and wound.  Allergic/Immunologic: Negative for immunocompromised state.  Neurological:  Negative for speech difficulty, weakness and light-headedness.  Hematological:  Does not bruise/bleed easily.  Psychiatric/Behavioral: Negative.       Objective:    Physical Exam Vitals and nursing note reviewed.  Constitutional:      General: She is not in acute distress.    Appearance: Normal appearance. She is normal weight. She is not ill-appearing, toxic-appearing or diaphoretic.  HENT:     Head: Normocephalic and atraumatic.     Right Ear: Tympanic membrane, ear canal and external ear normal.     Left Ear: Tympanic membrane, ear canal and external ear normal.     Mouth/Throat:     Mouth: Mucous membranes are moist.     Pharynx: Oropharynx is clear. No oropharyngeal exudate or posterior oropharyngeal erythema.   Eyes:     General: No scleral icterus.       Right eye: No discharge.        Left eye: No discharge.     Extraocular Movements: Extraocular movements intact.     Conjunctiva/sclera: Conjunctivae normal.     Pupils: Pupils are equal, round, and reactive to light.  Neck:     Vascular: No carotid bruit.  Cardiovascular:     Rate and Rhythm: Normal rate and regular rhythm.  Pulmonary:     Effort: Pulmonary effort is normal.     Breath sounds: Normal breath sounds.  Abdominal:     General: Abdomen is flat. Bowel sounds are normal. There is no distension.     Palpations: There is no mass.     Tenderness: There is no abdominal tenderness.     Hernia: No hernia is present.  Musculoskeletal:     Cervical back: No rigidity or tenderness.  Lymphadenopathy:     Cervical: No cervical adenopathy.  Skin:    General: Skin is warm and dry.  Neurological:     Mental Status: She is alert and oriented to person, place, and time.  Psychiatric:        Mood and Affect: Mood normal.        Behavior: Behavior normal.    BP 100/60   Pulse 63   Temp (!) 97.3 F (36.3 C) (Temporal)   Ht '5\' 10"'$  (1.778 m)   Wt 173 lb (78.5 kg)   SpO2 97%   BMI 24.82 kg/m  Wt Readings from Last 3 Encounters:  07/06/21 173 lb (78.5 kg)  05/25/21 178 lb 3.2  oz (80.8 kg)  05/06/21 172 lb 9.6 oz (78.3 kg)     Health Maintenance Due  Topic Date Due   Pneumococcal Vaccine 62-41 Years old (1 - PCV) Never done   PAP SMEAR-Modifier  05/10/2021    There are no preventive care reminders to display for this patient.  Lab Results  Component Value Date   TSH 2.50 03/05/2020   Lab Results  Component Value Date   WBC 12.7 (H) 07/06/2021   HGB 14.0 07/06/2021   HCT 42.7 07/06/2021   MCV 94.9 07/06/2021   PLT 322.0 07/06/2021   Lab Results  Component Value Date   NA 142 07/06/2021   K 4.1 07/06/2021   CO2 29 07/06/2021   GLUCOSE 98 07/06/2021   BUN 22 07/06/2021   CREATININE 0.75 07/06/2021   BILITOT 0.9  07/06/2021   ALKPHOS 60 07/06/2021   AST 21 07/06/2021   ALT 20 07/06/2021   PROT 6.6 07/06/2021   ALBUMIN 4.2 07/06/2021   CALCIUM 9.4 07/06/2021   ANIONGAP 7 04/23/2020   GFR 85.77 07/06/2021   Lab Results  Component Value Date   CHOL 151 07/06/2021   Lab Results  Component Value Date   HDL 48.90 07/06/2021   Lab Results  Component Value Date   LDLCALC 86 07/06/2021   Lab Results  Component Value Date   TRIG 77.0 07/06/2021   Lab Results  Component Value Date   CHOLHDL 3 07/06/2021   Lab Results  Component Value Date   HGBA1C 5.5 02/01/2021      Assessment & Plan:   Problem List Items Addressed This Visit       Other   Lymphocytosis   Relevant Orders   CBC w/Diff (Completed)   Ambulatory referral to Hematology / Oncology   Healthcare maintenance   Relevant Orders   Comprehensive metabolic panel (Completed)   Lipid panel (Completed)   Urinalysis, Routine w reflex microscopic (Completed)   Other Visit Diagnoses     Need for vaccination against Streptococcus pneumoniae    -  Primary   Relevant Orders   Pneumococcal conjugate vaccine 20-valent (Prevnar 20) (Completed)   Strain of lumbar region, initial encounter       Relevant Medications   methocarbamol (ROBAXIN-750) 750 MG tablet   Thoracic myofascial strain, initial encounter       Relevant Medications   methocarbamol (ROBAXIN-750) 750 MG tablet       Meds ordered this encounter  Medications   methocarbamol (ROBAXIN-750) 750 MG tablet    Sig: Take 1 tablet (750 mg total) by mouth every 6 (six) hours as needed for muscle spasms.    Dispense:  60 tablet    Refill:  0     Follow-up: Return in about 1 year (around 07/06/2022), or if symptoms worsen or fail to improve.  Given information on health maintenance and disease prevention.  Also given information on osteopenia.  She will follow-up with her GYN provider for her yearly Pap smear.  Libby Maw, MD

## 2021-07-07 ENCOUNTER — Encounter: Payer: Self-pay | Admitting: Family Medicine

## 2021-07-07 ENCOUNTER — Ambulatory Visit: Payer: Managed Care, Other (non HMO)

## 2021-07-08 ENCOUNTER — Telehealth: Payer: Self-pay | Admitting: *Deleted

## 2021-07-08 NOTE — Addendum Note (Signed)
Addended by: Jon Billings on: 07/08/2021 07:53 AM   Modules accepted: Orders

## 2021-07-08 NOTE — Telephone Encounter (Signed)
Per referral Dr. Ethelene Hal - called and gave upcoming appointments - confirmed - mailed welcome packet with calendar

## 2021-07-08 NOTE — Progress Notes (Signed)
Labs look okay. Lymphocytes remain elevated but stable. Have referred to Hematologist.

## 2021-07-12 ENCOUNTER — Inpatient Hospital Stay: Payer: Managed Care, Other (non HMO) | Admitting: Hematology & Oncology

## 2021-07-12 ENCOUNTER — Telehealth: Payer: Self-pay

## 2021-07-12 ENCOUNTER — Other Ambulatory Visit: Payer: Self-pay

## 2021-07-12 ENCOUNTER — Encounter: Payer: Self-pay | Admitting: Hematology & Oncology

## 2021-07-12 ENCOUNTER — Inpatient Hospital Stay: Payer: Managed Care, Other (non HMO) | Attending: Hematology & Oncology

## 2021-07-12 VITALS — BP 107/54 | HR 66 | Temp 98.8°F | Resp 18 | Ht 70.0 in | Wt 175.0 lb

## 2021-07-12 DIAGNOSIS — D72820 Lymphocytosis (symptomatic): Secondary | ICD-10-CM

## 2021-07-12 LAB — CBC WITH DIFFERENTIAL (CANCER CENTER ONLY)
Abs Immature Granulocytes: 0.04 10*3/uL (ref 0.00–0.07)
Basophils Absolute: 0.1 10*3/uL (ref 0.0–0.1)
Basophils Relative: 1 %
Eosinophils Absolute: 0.2 10*3/uL (ref 0.0–0.5)
Eosinophils Relative: 1 %
HCT: 44.1 % (ref 36.0–46.0)
Hemoglobin: 14.5 g/dL (ref 12.0–15.0)
Immature Granulocytes: 0 %
Lymphocytes Relative: 54 %
Lymphs Abs: 7.9 10*3/uL — ABNORMAL HIGH (ref 0.7–4.0)
MCH: 31.4 pg (ref 26.0–34.0)
MCHC: 32.9 g/dL (ref 30.0–36.0)
MCV: 95.5 fL (ref 80.0–100.0)
Monocytes Absolute: 0.8 10*3/uL (ref 0.1–1.0)
Monocytes Relative: 6 %
Neutro Abs: 5.4 10*3/uL (ref 1.7–7.7)
Neutrophils Relative %: 38 %
Platelet Count: 306 10*3/uL (ref 150–400)
RBC: 4.62 MIL/uL (ref 3.87–5.11)
RDW: 12.7 % (ref 11.5–15.5)
WBC Count: 14.4 10*3/uL — ABNORMAL HIGH (ref 4.0–10.5)
nRBC: 0 % (ref 0.0–0.2)

## 2021-07-12 LAB — CMP (CANCER CENTER ONLY)
ALT: 20 U/L (ref 0–44)
AST: 22 U/L (ref 15–41)
Albumin: 4.3 g/dL (ref 3.5–5.0)
Alkaline Phosphatase: 65 U/L (ref 38–126)
Anion gap: 5 (ref 5–15)
BUN: 12 mg/dL (ref 8–23)
CO2: 33 mmol/L — ABNORMAL HIGH (ref 22–32)
Calcium: 9.8 mg/dL (ref 8.9–10.3)
Chloride: 104 mmol/L (ref 98–111)
Creatinine: 0.73 mg/dL (ref 0.44–1.00)
GFR, Estimated: >60 mL/min (ref >60)
Glucose, Bld: 103 mg/dL — ABNORMAL HIGH (ref 70–99)
Potassium: 4 mmol/L (ref 3.5–5.1)
Sodium: 142 mmol/L (ref 135–145)
Total Bilirubin: 0.8 mg/dL (ref 0.3–1.2)
Total Protein: 6.9 g/dL (ref 6.5–8.1)

## 2021-07-12 LAB — LACTATE DEHYDROGENASE: LDH: 150 U/L (ref 98–192)

## 2021-07-12 LAB — SAVE SMEAR(SSMR), FOR PROVIDER SLIDE REVIEW

## 2021-07-12 NOTE — Telephone Encounter (Signed)
Appts made per los 07/12/21 and req to view in [employer]

## 2021-07-12 NOTE — Progress Notes (Signed)
Hematology and Oncology Follow Up Visit  [name]:[id number] Feb 05, 1961 60 y.o. 07/12/2021   Principle Diagnosis:  CLL-  Stage A  --  13q-  Current Therapy:   Observation     Interim History:  [name] is back for a follow-up.  She initially saw [name] a year ago.  At that time, as well that she had a monoclonal B-cell lymphocytosis.  She did have a monoclonal B-cell population of cells on flow cytometry..  She did have a deleted 13q.  The IgHV was not mutated.  She has been doing well.  She was sent back by her family doctor because of the higher white cell count.  She has had no problems with infections.  She is not noted any swollen lymph nodes.  She has had no issues with nausea or vomiting.  Thinks she did have COVID.  I think this was back in the wintertime.  She has had no change in bowel or bladder habits.  She is up-to-date with her mammogram.  She had her colonoscopy a year ago.  There has been no problems of weight loss or weight gain.  She has had no rashes.  She has had no headache.  She exercises.  She does not smoke.  She has occasional alcoholic beverage.  I would have to say that overall, performance status is probably ECOG 0.  Medications:  Current Outpatient Medications:    acetaminophen (TYLENOL) 500 MG tablet, Take 1,000 mg by mouth every 6 (six) hours as needed for mild pain., Disp: , Rfl:    b complex vitamins capsule, Take 1 capsule by mouth daily., Disp: , Rfl:    Calcium Carb-Cholecalciferol 600-800 MG-UNIT TABS, Take 1 tablet by mouth 2 (two) times daily., Disp: , Rfl:    cycloSPORINE (RESTASIS) 0.05 % ophthalmic emulsion, Place 1 drop into both eyes 2 (two) times daily., Disp: 1.5 mL, Rfl: 5   levocetirizine (XYZAL) 5 MG tablet, Take 5 mg by mouth every evening. , Disp: , Rfl:    LINZESS 145 MCG CAPS capsule, TAKE 1 CAPSULE (145 MCG TOTAL) BY MOUTH DAILY BEFORE BREAKFAST., Disp: 90 capsule, Rfl: 1   magnesium oxide (MAG-OX) 400 MG tablet,  Take 400 mg by mouth daily., Disp: , Rfl:    methocarbamol (ROBAXIN-750) 750 MG tablet, Take 1 tablet (750 mg total) by mouth every 6 (six) hours as needed for muscle spasms., Disp: 60 tablet, Rfl: 0   Multiple Vitamin (MULTIVITAMINS PO), Take 1 tablet by mouth daily., Disp: , Rfl:    polyethylene glycol (MIRALAX / GLYCOLAX) packet, Take 17 g by mouth daily., Disp: , Rfl:    valACYclovir (VALTREX) 500 MG tablet, TAKE ONE TABLET BY MOUTH ONE TIME DAILY, Disp: 90 tablet, Rfl: 3   ZINC-VITAMIN C PO, Take 2 capsules by mouth daily with breakfast., Disp: , Rfl:    EPIPEN 2-PAK 0.3 MG/0.3ML SOAJ injection, , Disp: , Rfl:   Allergies:  Allergies  Allergen Reactions   Chlordiazepoxide-Clidinium Other (See Comments)    "loopy"   Codeine Nausea And Vomiting    Dizziness   Penicillins Hives    Past Medical History, Surgical history, Social history, and Family History were reviewed and updated.  Review of Systems: Review of Systems  Constitutional: Negative.   HENT:  Negative.    Eyes: Negative.   Respiratory: Negative.    Cardiovascular: Negative.   Gastrointestinal: Negative.   Endocrine: Negative.   Genitourinary: Negative.    Musculoskeletal: Negative.   Skin: Negative.  Neurological: Negative.   Hematological: Negative.   Psychiatric/Behavioral: Negative.     Physical Exam:  height is '5\' 10"'$  (1.778 m) and weight is 175 lb (79.4 kg). Her oral temperature is 98.8 F (37.1 C). Her blood pressure is 107/54 (abnormal) and her pulse is 66. Her respiration is 18 and oxygen saturation is 100%.   Wt Readings from Last 3 Encounters:  07/12/21 175 lb (79.4 kg)  07/06/21 173 lb (78.5 kg)  05/25/21 178 lb 3.2 oz (80.8 kg)    Physical Exam Vitals reviewed.  HENT:     Head: Normocephalic and atraumatic.  Eyes:     Pupils: Pupils are equal, round, and reactive to light.  Cardiovascular:     Rate and Rhythm: Normal rate and regular rhythm.     Heart sounds: Normal heart sounds.   Pulmonary:     Effort: Pulmonary effort is normal.     Breath sounds: Normal breath sounds.  Abdominal:     General: Bowel sounds are normal.     Palpations: Abdomen is soft.  Musculoskeletal:        General: No tenderness or deformity. Normal range of motion.     Cervical back: Normal range of motion.  Lymphadenopathy:     Cervical: No cervical adenopathy.  Skin:    General: Skin is warm and dry.     Findings: No erythema or rash.  Neurological:     Mental Status: She is alert and oriented to person, place, and time.  Psychiatric:        Behavior: Behavior normal.        Thought Content: Thought content normal.        Judgment: Judgment normal.     Lab Results  Component Value Date   WBC 14.4 (H) 07/12/2021   HGB 14.5 07/12/2021   HCT 44.1 07/12/2021   MCV 95.5 07/12/2021   PLT 306 07/12/2021     Chemistry      Component Value Date/Time   NA 142 07/12/2021 1102   K 4.0 07/12/2021 1102   CL 104 07/12/2021 1102   CO2 33 (H) 07/12/2021 1102   BUN 12 07/12/2021 1102   CREATININE 0.73 07/12/2021 1102      Component Value Date/Time   CALCIUM 9.8 07/12/2021 1102   ALKPHOS 65 07/12/2021 1102   AST 22 07/12/2021 1102   ALT 20 07/12/2021 1102   BILITOT 0.8 07/12/2021 1102      Impression and Plan: [name] is a very charming 60 year old white female.  She comes in with her husband.  That remarried 2 years.  I am so happy for them.  I did look at her blood smear.  Her blood smear looked okay.  She had smudge cells.  She had mature lymphocytes.  I do not see any nucleated red blood cells.  I did not see any rouleaux formation.  Platelets were adequate in number and size.  Again I had believe that this is probably going to be CLL.  It could certainly be monoclonal B-cell lymphocytosis.  Regardless, this is very early on in the "spectrum" of CLL.  I went over the labs with she and her husband.  I reassured them.  I just do not see that there is any indication that we  have to do any intervention on her.  She does not need any x-rays.  I cannot feel her spleen.  She does not need a bone marrow test.  I told her that she could certainly  live with this for years.  I told her that if we ever had to do treatment, we could probably use oral therapy.  Again she has had this for several years.  This is certainly nothing new.  We does have to follow her along.  I probably would see her back in another 6 months.  I think 70-monthfollow-up would be very reasonable.   PVolanda Napoleon MD 8/1/20224:29 PM

## 2021-07-12 NOTE — Telephone Encounter (Signed)
Appts made per 8.1/22 los and pt req to view in [employer]

## 2021-07-13 ENCOUNTER — Ambulatory Visit: Payer: Managed Care, Other (non HMO) | Admitting: Family Medicine

## 2021-07-13 ENCOUNTER — Encounter: Payer: Self-pay | Admitting: Family Medicine

## 2021-07-13 ENCOUNTER — Other Ambulatory Visit: Payer: Self-pay | Admitting: Internal Medicine

## 2021-07-13 VITALS — BP 115/70 | HR 61 | Temp 97.8°F | Ht 70.0 in | Wt 173.4 lb

## 2021-07-13 DIAGNOSIS — M25551 Pain in right hip: Secondary | ICD-10-CM

## 2021-07-13 DIAGNOSIS — R1903 Right lower quadrant abdominal swelling, mass and lump: Secondary | ICD-10-CM | POA: Diagnosis not present

## 2021-07-13 LAB — IGG, IGA, IGM
IgA: 210 mg/dL (ref 87–352)
IgG (Immunoglobin G), Serum: 885 mg/dL (ref 586–1602)
IgM (Immunoglobulin M), Srm: 49 mg/dL (ref 26–217)

## 2021-07-13 NOTE — Progress Notes (Signed)
Established Patient Office Visit  Subjective:  Patient ID: [name], female    DOB: 1961/04/02  Age: 60 y.o. MRN: [id number]  CC:  Chief Complaint  Patient presents with   Hip Pain    Discuss right hip issues, some pains that come and go discomfort symptoms x 2 months.     HPI [name] presents for 32-monthhistory of pain in her right groin area.  Has no issue with walking or exercise.  Has not felt a bulge.  Ongoing history of irritable bowel with constipation.  She is careful with her diet to avoid inciting foods.  Ongoing follow-up with gastroenterology.  History of volvulus back in 2007.  Denies recent nausea or vomiting, black tarry stools or hematochezia.  Follow-up yesterday with hematology for lymphocytosis.  At this point hematology feels that it is benign.  Recent x-rays of the lumbar spine with relatively mild with minimal arthrosis.  Past Medical History:  Diagnosis Date   Allergic rhinitis    Allergy    dust   Anxiety    Back pain    Contact lens/glasses fitting    Depression    Endometriosis    Fatigue    GERD (gastroesophageal reflux disease)    Hiatal hernia    History of Ostium Secundum Atrial Septal Defect 1989   Status post repair in 1989/1990   Hx of migraines    IBS (irritable bowel syndrome)    Incontinence    [place]   Loss of appetite    Monoclonal B-cell lymphocytosis    Onychomycosis    Osteopenia of femoral neck 02/24/2020   Sleep apnea    uses mouthguard   Syncope 11/07/2019   Varicose veins     Past Surgical History:  Procedure Laterality Date   ABDOMINAL HYSTERECTOMY  2004   ASD REPAIR, SECUNDUM  1990   Dr. GJackie PlumSINUPLASTY Left    COLONOSCOPY     ENDOVENOUS ABLATION SAPHENOUS VEIN W/ LASER Left 09/03/2020   EVLA  LSSV  and stab phlebectomy  10-20   EYE SURGERY     muscle reattachment 02/2017   hysterectomy for severe endometriosis     NASAL SEPTUM SURGERY     Dr.  TGeorgia Lopes  OPEN REDUCTION INTERNAL FIXATION (ORIF) DISTAL RADIAL FRACTURE Right 06/20/2017   Procedure: OPEN REDUCTION INTERNAL FIXATION (ORIF) DISTAL RADIAL FRACTURE;  Surgeon: KLeanora Cover MD;  [facility] [facility]  Service: Orthopedics;  Laterality: Right;   right ankle surgery  2010   right leg fracture with surgery     done with ankle surgery   TRANSTHORACIC ECHOCARDIOGRAM   January 2012   Normal LV size and function, EF greater than 55%. Mild LA dilation. No intra-atrial shunt with bubble study. Normal pulmonary pressures. Only trace to mild mitral and tricuspid regurgitation.   TRANSTHORACIC ECHOCARDIOGRAM  11/07/2019   EF 60 to 55%.  LVH.  GR 1 DD, with moderate LA dilation.  Relatively normal valves.  RA pressure estimated 8 mmHg.   UPPER GASTROINTESTINAL ENDOSCOPY     varicose laser vein surgery  2009    Family History  Problem Relation Age of Onset   Diabetes Mother    Colon cancer Paternal Grandfather    Heart disease Maternal Grandfather    Prostate cancer Maternal Grandfather    Lung cancer Maternal Grandmother        with mets to the brain   Clotting disorder Maternal Grandmother  Clotting disorder Sister    Heart disease Sister    Colon polyps Neg Hx    Esophageal cancer Neg Hx    Rectal cancer Neg Hx    Stomach cancer Neg Hx     Social History   Socioeconomic History   Marital status: Married    Spouse name: Richard   Number of children: Not on file   Years of education: Not on file   Highest education level: Not on file  Occupational History   Occupation: remodel houses    Employer: [employer]  Tobacco Use   Smoking status: Former    Packs/day: 1.00    Years: 10.00    Pack years: 10.00    Types: Cigarettes    Quit date: 12/12/1988    Years since quitting: 32.6   Smokeless tobacco: Never  Vaping Use   Vaping Use: Never used  Substance and Sexual Activity   Alcohol use: Yes    Comment: social use/wine rare   Drug use:  No   Sexual activity: Not on file  Other Topics Concern   Not on file  Social History Narrative   They have 2 children and at least two grandchildren. She does all her activities of daily living. She works as a Midwife for [employer].     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 She does other days of the gym and she is able to at least 60 minutes a day 5 days a week.   She is a former smoker who quit in 1990. This was after smoking a pack a day for 10 years. She drinks social alcohol on occasion.   Social Determinants of Health   Financial Resource Strain: Not on file  Food Insecurity: Not on file  Transportation Needs: Not on file  Physical Activity: Not on file  Stress: Not on file  Social Connections: Not on file  Intimate Partner Violence: Not on file    Outpatient Medications Prior to Visit  Medication Sig Dispense Refill   acetaminophen (TYLENOL) 500 MG tablet Take 1,000 mg by mouth every 6 (six) hours as needed for mild pain.     b complex vitamins capsule Take 1 capsule by mouth daily.     Calcium Carb-Cholecalciferol 600-800 MG-UNIT TABS Take 1 tablet by mouth 2 (two) times daily.     cycloSPORINE (RESTASIS) 0.05 % ophthalmic emulsion Place 1 drop into both eyes 2 (two) times daily. 1.5 mL 5   EPIPEN 2-PAK 0.3 MG/0.3ML SOAJ injection      levocetirizine (XYZAL) 5 MG tablet Take 5 mg by mouth every evening.      LINZESS 145 MCG CAPS capsule TAKE 1 CAPSULE (145 MCG TOTAL) BY MOUTH DAILY BEFORE BREAKFAST. 90 capsule 1   magnesium oxide (MAG-OX) 400 MG tablet Take 400 mg by mouth daily.     methocarbamol (ROBAXIN-750) 750 MG tablet Take 1 tablet (750 mg total) by mouth every 6 (six) hours as needed for muscle spasms. 60 tablet 0   Multiple Vitamin (MULTIVITAMINS PO) Take 1 tablet by mouth daily.     polyethylene glycol (MIRALAX / GLYCOLAX) packet Take 17 g by mouth daily.      valACYclovir (VALTREX) 500 MG tablet TAKE ONE TABLET BY MOUTH ONE TIME DAILY 90 tablet 3   ZINC-VITAMIN C PO Take 2 capsules by mouth daily with breakfast.  No facility-administered medications prior to visit.    Allergies  Allergen Reactions   Chlordiazepoxide-Clidinium Other (See Comments)    "loopy"   Codeine Nausea And Vomiting    Dizziness   Penicillins Hives    ROS Review of Systems  Constitutional: Negative.   Eyes:  Negative for photophobia and visual disturbance.  Respiratory: Negative.    Cardiovascular: Negative.   Gastrointestinal:  Positive for constipation. Negative for abdominal distention, abdominal pain, anal bleeding and blood in stool.  Musculoskeletal:  Positive for arthralgias.  Psychiatric/Behavioral: Negative.       Objective:    Physical Exam Vitals and nursing note reviewed.  Constitutional:      General: She is not in acute distress.    Appearance: Normal appearance. She is normal weight. She is not ill-appearing or toxic-appearing.  Eyes:     Conjunctiva/sclera: Conjunctivae normal.  Pulmonary:     Effort: Pulmonary effort is normal.  Abdominal:     General: Abdomen is flat. Bowel sounds are normal. There is no distension.     Palpations: There is mass (right lower quadrant versus loop of bowel).     Tenderness: There is no abdominal tenderness.     Hernia: No hernia is present.  Musculoskeletal:     Right hip: Normal. No tenderness. Normal range of motion.       Legs:  Neurological:     Mental Status: She is alert and oriented to person, place, and time.  Psychiatric:        Mood and Affect: Mood normal.        Behavior: Behavior normal.    BP 115/70 (BP Location: Right Arm, Patient Position: Sitting, Cuff Size: Normal)   Pulse 61   Temp 97.8 F (36.6 C) (Temporal)   Ht '5\' 10"'$  (1.778 m)   Wt 173 lb 6.4 oz (78.7 kg)   SpO2 97%   BMI 24.88 kg/m  Wt Readings from Last 3 Encounters:  07/13/21 173 lb 6.4 oz (78.7 kg)   07/12/21 175 lb (79.4 kg)  07/06/21 173 lb (78.5 kg)     Health Maintenance Due  Topic Date Due   Pneumococcal Vaccine 42-76 Years old (1 - PCV) Never done   PAP SMEAR-Modifier  05/10/2021   INFLUENZA VACCINE  07/12/2021    There are no preventive care reminders to display for this patient.  Lab Results  Component Value Date   TSH 2.50 03/05/2020   Lab Results  Component Value Date   WBC 14.4 (H) 07/12/2021   HGB 14.5 07/12/2021   HCT 44.1 07/12/2021   MCV 95.5 07/12/2021   PLT 306 07/12/2021   Lab Results  Component Value Date   NA 142 07/12/2021   K 4.0 07/12/2021   CO2 33 (H) 07/12/2021   GLUCOSE 103 (H) 07/12/2021   BUN 12 07/12/2021   CREATININE 0.73 07/12/2021   BILITOT 0.8 07/12/2021   ALKPHOS 65 07/12/2021   AST 22 07/12/2021   ALT 20 07/12/2021   PROT 6.9 07/12/2021   ALBUMIN 4.3 07/12/2021   CALCIUM 9.8 07/12/2021   ANIONGAP 5 07/12/2021   GFR 85.77 07/06/2021   Lab Results  Component Value Date   CHOL 151 07/06/2021   Lab Results  Component Value Date   HDL 48.90 07/06/2021   Lab Results  Component Value Date   LDLCALC 86 07/06/2021   Lab Results  Component Value Date   TRIG 77.0 07/06/2021   Lab Results  Component Value Date   CHOLHDL 3  07/06/2021   Lab Results  Component Value Date   HGBA1C 5.5 02/01/2021      Assessment & Plan:   Problem List Items Addressed This Visit       Other   Right hip pain - Primary   Relevant Orders   DG Hip Unilat W OR W/O Pelvis 1V Right   Right lower quadrant abdominal mass   Relevant Orders   US Abdomen Complete    No orders of the defined types were placed in this encounter.   Follow-up: Return in about 1 month (around 08/13/2021).    Libby Maw, MD

## 2021-07-14 ENCOUNTER — Other Ambulatory Visit: Payer: Managed Care, Other (non HMO)

## 2021-07-14 ENCOUNTER — Ambulatory Visit (INDEPENDENT_AMBULATORY_CARE_PROVIDER_SITE_OTHER): Payer: Managed Care, Other (non HMO)

## 2021-07-14 ENCOUNTER — Other Ambulatory Visit: Payer: Self-pay

## 2021-07-14 ENCOUNTER — Ambulatory Visit: Payer: Managed Care, Other (non HMO)

## 2021-07-14 DIAGNOSIS — M25551 Pain in right hip: Secondary | ICD-10-CM | POA: Diagnosis not present

## 2021-07-14 LAB — SURGICAL PATHOLOGY

## 2021-07-22 ENCOUNTER — Encounter: Payer: Self-pay | Admitting: Hematology & Oncology

## 2021-07-22 LAB — FLOW CYTOMETRY

## 2021-07-29 ENCOUNTER — Telehealth: Payer: Self-pay | Admitting: Family Medicine

## 2021-07-29 ENCOUNTER — Encounter: Payer: Self-pay | Admitting: Family Medicine

## 2021-07-29 NOTE — Telephone Encounter (Signed)
Pt has covid again and is requesting the antiviral medication.

## 2021-07-30 ENCOUNTER — Encounter (INDEPENDENT_AMBULATORY_CARE_PROVIDER_SITE_OTHER): Payer: Self-pay

## 2021-07-30 ENCOUNTER — Telehealth: Payer: Managed Care, Other (non HMO) | Admitting: Physician Assistant

## 2021-07-30 ENCOUNTER — Telehealth: Payer: Self-pay | Admitting: Family Medicine

## 2021-07-30 ENCOUNTER — Other Ambulatory Visit: Payer: Self-pay | Admitting: Physician Assistant

## 2021-07-30 DIAGNOSIS — U071 COVID-19: Secondary | ICD-10-CM | POA: Diagnosis not present

## 2021-07-30 MED ORDER — NIRMATRELVIR/RITONAVIR (PAXLOVID)TABLET: 3.0000 | ORAL_TABLET | Freq: Two times a day (BID) | ORAL | 0 refills | Status: DC

## 2021-07-30 MED ORDER — NIRMATRELVIR/RITONAVIR (PAXLOVID)TABLET: 3.0000 | ORAL_TABLET | Freq: Two times a day (BID) | ORAL | 0 refills | Status: AC

## 2021-07-30 NOTE — Telephone Encounter (Signed)
Advised patient to call the office to schedule appointment with one of the Providers for evaluation.

## 2021-07-30 NOTE — Telephone Encounter (Signed)
Pt called to make an appointment but none of our offices have anything left today si I did suggest doing an e-visit. Pt requested a call back at [phone]

## 2021-07-30 NOTE — Telephone Encounter (Signed)
error 

## 2021-07-30 NOTE — Patient Instructions (Signed)
Hello Candace "Barbie",  You are being placed in the home monitoring program for COVID-19 (commonly known as Coronavirus).  This is because you are suspected to have the virus or are known to have the virus.  If you are unsure which group you fall into call your clinic.    As part of this program, you'll answer a daily questionnaire in the MyChart mobile app. You'll receive a notification through the MyChart app when the questionnaire is available. When you log in to MyChart, you'll see the tasks in your To Do activity.       Clinicians will see any answers that are concerning and take appropriate steps.  If at any point you are having a medical emergency, call 911.  If otherwise concerned call your clinic instead of coming into the clinic or hospital.  To keep from spreading the disease you should: Stay home and limit contact with other people as much as possible.  Wash your hands frequently. Cover your coughs and sneezes with a tissue, and throw used tissues in the trash.   Clean and disinfect frequently touched surfaces and objects.    Take care of yourself by: Staying home Resting Drinking fluids Take fever-reducing medications (Tylenol/Acetaminophen and Ibuprofen)  For more information on the disease go to the Centers for Disease Control and Prevention website     COVID-19: What to Do if You Are Sick CDC has updated isolation and quarantine recommendations for the public, and is revising the CDC website to reflect these changes. These recommendations do not apply to healthcare personnel and do not supersede state, local, tribal, or territorial laws, rules, andregulations. If you have a fever, cough or other symptoms, you might have COVID-19. Most people have mild illness and are able to recover at home. If you are sick: Keep track of your symptoms. If you have an emergency warning sign (including trouble breathing), call 911. Steps to help prevent the spread of COVID-19 if you are  sick If you are sick with COVID-19 or think you might have COVID-19, follow the steps below to care for yourself and to help protect other peoplein your home and community. Stay home except to get medical care Stay home. Most people with COVID-19 have mild illness and can recover at home without medical care. Do not leave your home, except to get medical care. Do not visit public areas. Take care of yourself. Get rest and stay hydrated. Take over-the-counter medicines, such as acetaminophen, to help you feel better. Stay in touch with your doctor. Call before you get medical care. Be sure to get care if you have trouble breathing, or have any other emergency warning signs, or if you think it is an emergency. Avoid public transportation, ride-sharing, or taxis. Separate yourself from other people As much as possible, stay in a specific room and away from other people and pets in your home. If possible, you should use a separate bathroom. If you need to be around other people or animals in oroutside of the home, wear a mask. Tell your close contactsthat they may have been exposed to COVID-19. An infected person can spread COVID-19 starting 48 hours (or 2 days) before the person has any symptoms or tests positive. By letting your close contacts know they may have been exposed to COVID-19, you are helping to protect everyone. Additional guidance is available for those living in close quarters and shared housing. See COVID-19 and Animals if you have questions about pets. If you are diagnosed  with COVID-19, someone from the health department may call you. Answer the call to slow the spread. Monitor your symptoms Symptoms of COVID-19 include fever, cough, or other symptoms. Follow care instructions from your healthcare provider and local health department. Your local health authorities may give instructions on checking your symptoms and reporting information. When to seek emergency medical attention Look  for emergency warning signs* for COVID-19. If someone is showing any of these signs, seek emergency medical care immediately: Trouble breathing Persistent pain or pressure in the chest New confusion Inability to wake or stay awake Pale, gray, or blue-colored skin, lips, or nail beds, depending on skin tone *This list is not all possible symptoms. Please call your medical provider forany other symptoms that are severe or concerning to you. Call 911 or call ahead to your local emergency facility: Notify the operator that you are seeking care for someone who has or may haveCOVID-19. Call ahead before visiting your doctor Call ahead. Many medical visits for routine care are being postponed or done by phone or telemedicine. If you have a medical appointment that cannot be postponed, call your doctor's office, and tell them you have or may have COVID-19. This will help the office protect themselves and other patients. Get tested If you have symptoms of COVID-19, get tested. While waiting for test results, you stay away from others, including staying apart from those living in your household. Self-tests are one of several options for testing for the virus that causes COVID-19 and may be more convenient than laboratory-based tests and point-of-care tests. Ask your healthcare provider or your local health department if you need help interpreting your test results. You can visit your state, tribal, local, and territorial health department's website to look for the latest local information on testing sites. If you are sick, wear a mask over your nose and mouth You should wear a mask over your nose and mouth if you must be around other people or animals, including pets (even at home). You don't need to wear the mask if you are alone. If you can't put on a mask (because of trouble breathing, for example), cover your coughs and sneezes in some other way. Try to stay at least 6 feet away from other people. This  will help protect the people around you. Masks should not be placed on young children under age 38 years, anyone who has trouble breathing, or anyone who is not able to remove the mask without help. Note: During the COVID-19 pandemic, medical grade facemasks are reserved forhealthcare workers and some first responders. Cover your coughs and sneezes Cover your mouth and nose with a tissue when you cough or sneeze. Throw away used tissues in a lined trash can. Immediately wash your hands with soap and water for at least 20 seconds. If soap and water are not available, clean your hands with an alcohol-based hand sanitizer that contains at least 60% alcohol. Clean your hands often Wash your hands often with soap and water for at least 20 seconds. This is especially important after blowing your nose, coughing, or sneezing; going to the bathroom; and before eating or preparing food. Use hand sanitizer if soap and water are not available. Use an alcohol-based hand sanitizer with at least 60% alcohol, covering all surfaces of your hands and rubbing them together until they feel dry. Soap and water are the best option, especially if hands are visibly dirty. Avoid touching your eyes, nose, and mouth with unwashed hands. Handwashing Tips Avoid  sharing personal household items Do not share dishes, drinking glasses, cups, eating utensils, towels, or bedding with other people in your home. Wash these items thoroughly after using them with soap and water or put in the dishwasher. Clean all "high-touch" surfaces every day Clean and disinfect high-touch surfaces in your "sick room" and bathroom; wear disposable gloves. Let someone else clean and disinfect surfaces in common areas, but you should clean your bedroom and bathroom, if possible. If a caregiver or other person needs to clean and disinfect a sick person's bedroom or bathroom, they should do so on an as-needed basis. The caregiver/other person should wear a  mask and disposable gloves prior to cleaning. They should wait as long as possible after the person who is sick has used the bathroom before coming in to clean and use the bathroom. High-touch surfaces include phones, remote controls, counters, tabletops, doorknobs, bathroom fixtures, toilets, keyboards, tablets, and bedside tables. Clean and disinfect areas that may have blood, stool, or body fluids on them. Use household cleaners and disinfectants. Clean the area or item with soap and water or another detergent if it is dirty. Then, use a household disinfectant. Be sure to follow the instructions on the label to ensure safe and effective use of the product. Many products recommend keeping the surface wet for several minutes to ensure germs are killed. Many also recommend precautions such as wearing gloves and making sure you have good ventilation during use of the product. Use a product from [employer] List N: Disinfectants for Coronavirus ([id number]). Complete Disinfection Guidance When you can be around others after being sick with COVID-19 Deciding when you can be around others is different for different situations. Find out when you can safely end home isolation. For any additional questions about your care,contact your healthcare provider or state or local health department. 11/18/2020 Content source: [facility] for Immunization and Respiratory Diseases ([facility]), Division of Viral Diseases This information is not intended to replace advice given to you by your health care provider. Make sure you discuss any questions you have with your healthcare provider. Document Revised: 01/15/2021 Document Reviewed: 01/15/2021 Elsevier Patient Education  2022 [place] are being prescribed PAXLOVID for COVID-19 infection.   Please call the pharmacy or go through the drive through vs going inside if you are picking up the mediation yourself to prevent further spread. If prescribed to a [facility] affiliated pharmacy, a pharmacist will bring the medication out to your car.   Medications to hold while taking this treatment: None  *If asked to hold, you can resume them 24 hours after your last dose   ADMINISTRATION INSTRUCTIONS: Take with or without food. Swallow the tablets whole. Don't chew, crush, or break the medications because it might not work as well  For each dose of the medication, you should be taking 3 tablets together (2 pink oval and 1 white oval) TWICE a day for FIVE days   Finish your full five-day course of Paxlovid even if you feel better before you're done. Stopping this medication too early can make it less effective to prevent severe illness related to [place].    Paxlovid is prescribed for YOU ONLY. Don't share it with others, even if they have similar symptoms as you. This medication might not be right for everyone.  Make sure to take steps to protect yourself and others while you're taking this medication in order to get well soon and to prevent others from getting sick with  COVID-19.  Paxlovid (nirmatrelvir / ritonavir) can cause hormonal birth control medications to not work well. If you or your partner is currently taking hormonal birth control, use condoms or other birth control methods to prevent unintended pregnancies.    COMMON SIDE EFFECTS: Altered or bad taste in your mouth  Diarrhea  High blood pressure (1% of people) Muscle aches (1% of people)     If your COVID-19 symptoms get worse, get medical help right away. Call 911 if you experience symptoms such as worsening cough, trouble breathing, chest pain that doesn't go away, confusion, a hard time staying awake, and pale or blue-colored skin. This medication won't prevent all COVID-19 cases from getting worse.   Can take to lessen severity: Vit C '500mg'$  twice daily Quercertin 250-'500mg'$  twice daily Zinc 75-'100mg'$  daily Melatonin 3-6 mg at bedtime Vit D3 1000-2000 IU daily Aspirin 81 mg  daily with food Optional: Famotidine '20mg'$  daily Also can add tylenol/ibuprofen as needed for fevers and body aches May add Mucinex or Mucinex DM as needed for cough/congestion  10 Things You Can Do to Manage Your COVID-19 Symptoms at Home If you have possible or confirmed COVID-19 Stay home except to get medical care. Monitor your symptoms carefully. If your symptoms get worse, call your healthcare provider immediately. Get rest and stay hydrated. If you have a medical appointment, call the healthcare provider ahead of time and tell them that you have or may have COVID-19. For medical emergencies, call 911 and notify the dispatch personnel that you have or may have COVID-19. Cover your cough and sneezes with a tissue or use the inside of your elbow. Wash your hands often with soap and water for at least 20 seconds or clean your hands with an alcohol-based hand sanitizer that contains at least 60% alcohol. As much as possible, stay in a specific room and away from other people in your home. Also, you should use a separate bathroom, if available. If you need to be around other people in or outside of the home, wear a mask. Avoid sharing personal items with other people in your household, like dishes, towels, and bedding. Clean all surfaces that are touched often, like counters, tabletops, and doorknobs. Use household cleaning sprays or wipes according to the label instructions. June 06/26/2020 This information is not intended to replace advice given to you by your health care provider. Make sure you discuss any questions you have with your healthcare provider. Document Revised: 01/15/2021 Document Reviewed: 01/15/2021 Elsevier Patient Education  [place].

## 2021-07-30 NOTE — Progress Notes (Signed)
Virtual Visit Consent   [name], you are scheduled for a virtual visit with a [facility] provider today.     Just as with appointments in the office, your consent must be obtained to participate.  Your consent will be active for this visit and any virtual visit you may have with one of our providers in the next 365 days.     If you have a MyChart account, a copy of this consent can be sent to you electronically.  All virtual visits are billed to your insurance company just like a traditional visit in the office.    As this is a virtual visit, video technology does not allow for your provider to perform a traditional examination.  This may limit your provider's ability to fully assess your condition.  If your provider identifies any concerns that need to be evaluated in person or the need to arrange testing (such as labs, EKG, etc.), we will make arrangements to do so.     Although advances in technology are sophisticated, we cannot ensure that it will always work on either your end or our end.  If the connection with a video visit is poor, the visit may have to be switched to a telephone visit.  With either a video or telephone visit, we are not always able to ensure that we have a secure connection.     I need to obtain your verbal consent now.   Are you willing to proceed with your visit today?    [name] has provided verbal consent on 07/30/2021 for a virtual visit (video or telephone).   Mar Daring, PA-C   Date: 07/30/2021 1:12 PM   Virtual Visit via Video Note   I, Mar Daring, connected with  [name]  ([id number], Jan 02, 1961) on 07/30/21 at  1:00 PM EDT by a video-enabled telemedicine application and verified that I am speaking with the correct person using two identifiers.  Location: Patient: Virtual Visit Location Patient: Home Provider: Virtual Visit Location Provider: Home Office   I discussed the limitations of evaluation and  management by telemedicine and the availability of in person appointments. The patient expressed understanding and agreed to proceed.    History of Present Illness: [name] is a 60 y.o. who identifies as a female who was assigned female at birth, and is being seen today for Covid 30.  HPI: URI  This is a new problem. Episode onset: tested positive for covid 64 yesterday; symptoms started wednesday. The problem has been gradually worsening. Associated symptoms include congestion, coughing, diarrhea, headaches (mild), a sore throat and swollen glands. Pertinent negatives include no ear pain, nausea, plugged ear sensation, rhinorrhea, sinus pain or vomiting. Associated symptoms comments: Post nasal drainage, chills and hot flashes, fatigue, body ache . She has tried NSAIDs, increased fluids and sleep (ibuprofen and tessalon perles) for the symptoms. The treatment provided moderate relief.  She did have Covid 19 in February 2022 and was treated with Paxlovid and had done well, wishes to take again for this infection. She has been vaccinated and boosted x 2.  Problems:  Patient Active Problem List   Diagnosis Date Noted   Right hip pain 07/13/2021   Right lower quadrant abdominal mass 07/13/2021   Prediabetes 08/12/2020   Healthcare maintenance 11/28/2019   Nonsustained ventricular tachycardia ([place]) 11/27/2019   Hospital discharge follow-up 11/11/2019   Hypotension 11/11/2019   Hypokalemia    Pain of upper abdomen  Syncope and collapse 11/07/2019   Bradycardia on ECG 11/07/2019   Upper respiratory infection 10/03/2019   Lymphocytosis 09/20/2019   Neutropenia ([facility]) 09/01/2019   Allergic rhinitis    OSA (obstructive sleep apnea) 02/20/2019   HSV (herpes simplex virus) anogenital infection 08/31/2018   Chronic constipation 05/31/2018   Eczema 01/09/2016   GERD (gastroesophageal reflux disease)-probable paroxsysmal relaxation LES 03/14/2012   Hair loss 06/29/2011   GERD  02/28/2011   VARICOSE VEINS, LOWER EXTREMITIES 01/08/2008   INTESTINAL VOLVULUS, LARGE BOWEL 01/08/2008   Anxiety 01/07/2008   IBS (irritable bowel syndrome) 01/07/2008   MIGRAINES, HX OF 01/07/2008   ATRIAL SEPTAL DEFECT - Status Post Repair 01/08/1988    Allergies:  Allergies  Allergen Reactions   Chlordiazepoxide-Clidinium Other (See Comments)    "loopy"   Codeine Nausea And Vomiting    Dizziness   Penicillins Hives   Medications:  Current Outpatient Medications:    nirmatrelvir/ritonavir EUA (PAXLOVID) 20 x 150 MG & 10 x '100MG'$  TABS, Take 3 tablets by mouth 2 (two) times daily for 5 days. (Take nirmatrelvir 150 mg two tablets twice daily for 5 days and ritonavir 100 mg one tablet twice daily for 5 days) Patient GFR is >60, Disp: 30 tablet, Rfl: 0   acetaminophen (TYLENOL) 500 MG tablet, Take 1,000 mg by mouth every 6 (six) hours as needed for mild pain., Disp: , Rfl:    b complex vitamins capsule, Take 1 capsule by mouth daily., Disp: , Rfl:    Calcium Carb-Cholecalciferol 600-800 MG-UNIT TABS, Take 1 tablet by mouth 2 (two) times daily., Disp: , Rfl:    cycloSPORINE (RESTASIS) 0.05 % ophthalmic emulsion, Place 1 drop into both eyes 2 (two) times daily., Disp: 1.5 mL, Rfl: 5   EPIPEN 2-PAK 0.3 MG/0.3ML SOAJ injection, , Disp: , Rfl:    levocetirizine (XYZAL) 5 MG tablet, Take 5 mg by mouth every evening. , Disp: , Rfl:    LINZESS 145 MCG CAPS capsule, TAKE 1 CAPSULE BY MOUTH DAILY BEFORE BREAKFAST., Disp: 90 capsule, Rfl: 1   magnesium oxide (MAG-OX) 400 MG tablet, Take 400 mg by mouth daily., Disp: , Rfl:    methocarbamol (ROBAXIN-750) 750 MG tablet, Take 1 tablet (750 mg total) by mouth every 6 (six) hours as needed for muscle spasms., Disp: 60 tablet, Rfl: 0   Multiple Vitamin (MULTIVITAMINS PO), Take 1 tablet by mouth daily., Disp: , Rfl:    polyethylene glycol (MIRALAX / GLYCOLAX) packet, Take 17 g by mouth daily., Disp: , Rfl:    valACYclovir (VALTREX) 500 MG tablet, TAKE ONE  TABLET BY MOUTH ONE TIME DAILY, Disp: 90 tablet, Rfl: 3   ZINC-VITAMIN C PO, Take 2 capsules by mouth daily with breakfast., Disp: , Rfl:   Observations/Objective: Patient is well-developed, well-nourished in no acute distress.  Resting comfortably at home.  Head is normocephalic, atraumatic.  No labored breathing.  Speech is clear and coherent with logical content.  Patient is alert and oriented at baseline.    Assessment and Plan: 1. COVID-19 - MyChart COVID-19 home monitoring program; Future - nirmatrelvir/ritonavir EUA (PAXLOVID) 20 x 150 MG & 10 x '100MG'$  TABS; Take 3 tablets by mouth 2 (two) times daily for 5 days. (Take nirmatrelvir 150 mg two tablets twice daily for 5 days and ritonavir 100 mg one tablet twice daily for 5 days) Patient GFR is >60  Dispense: 30 tablet; Refill: 0  - Continue OTC symptomatic management of choice - Will send OTC vitamins and supplement information through AVS -  Paxlovid prescribed - Patient enrolled in MyChart symptom monitoring - Push fluids - Rest as needed - Discussed return precautions and when to seek in-person evaluation, sent via AVS as well  Follow Up Instructions: I discussed the assessment and treatment plan with the patient. The patient was provided an opportunity to ask questions and all were answered. The patient agreed with the plan and demonstrated an understanding of the instructions.  A copy of instructions were sent to the patient via MyChart.  The patient was advised to call back or seek an in-person evaluation if the symptoms worsen or if the condition fails to improve as anticipated.  Time:  I spent 14 minutes with the patient via telehealth technology discussing the above problems/concerns.    Mar Daring, PA-C

## 2021-08-09 ENCOUNTER — Telehealth: Payer: Self-pay | Admitting: *Deleted

## 2021-08-09 NOTE — Telephone Encounter (Signed)
Called patient to review My Chart covid questionnaire responses. Patient reports worsening of weakness. Patient reports she has not been sleeping and works 9-10 hour days. Reviewed with patient to remain hydrated and to allow herself 10 minute breaks if possible throughout her work day. Reviewed with patient to monitor appetite and increase as tolerated. Patient reports she is drinking plenty of fluids and will contact PCP if symptoms worsen.

## 2021-08-10 ENCOUNTER — Other Ambulatory Visit: Payer: Self-pay

## 2021-08-10 ENCOUNTER — Ambulatory Visit (INDEPENDENT_AMBULATORY_CARE_PROVIDER_SITE_OTHER): Payer: Managed Care, Other (non HMO) | Admitting: Family Medicine

## 2021-08-10 ENCOUNTER — Encounter: Payer: Self-pay | Admitting: Family Medicine

## 2021-08-10 VITALS — BP 100/62 | HR 70 | Temp 99.4°F | Resp 97

## 2021-08-10 DIAGNOSIS — A084 Viral intestinal infection, unspecified: Secondary | ICD-10-CM | POA: Diagnosis not present

## 2021-08-10 NOTE — Progress Notes (Signed)
[name] [address] [place] [address] Dept: [phone] Dept Fax: [phone]  Office Visit  Subjective:    Patient ID: [name], female    DOB: [birth year], 60 y.o..   MRN: [id number]  Chief Complaint  Patient presents with   Follow-up    Pt c/o pain in lower abd area and leg cramps x3 days, with fever and diarrhea.    History of Present Illness:  Patient is in today for evaluation of a 3-day history of fever, diarrhea, and lower abdominal discomfort. [name] notes that she started with symptoms on Sunday, mainly noting suprapubic discomfort and left leg cramping. She then developed some nausea. On Monday, she went to work, but developed some chills and sweats during the day. When she got home, she found her temperature to be 100.5 F. She took some Tylenol overnight, which helped for her fever to come down. She continued with diarrhea yesterday and today. She also has had some diaphoresis at night. She has taken Pepto-Bismol to try and manage symptoms. She does have a history of IBS. [name] was seen by her gynecologist earlier today. She had a bimanual exam and a UA, both of which were reportedly normal. She denies any nasal congestion or cough.  Past Medical History: Patient Active Problem List   Diagnosis Date Noted   Right hip pain 07/13/2021   Right lower quadrant abdominal mass 07/13/2021   Prediabetes 08/12/2020   Healthcare maintenance 11/28/2019   Nonsustained ventricular tachycardia ([place]) 11/27/2019   Hospital discharge follow-up 11/11/2019   Hypotension 11/11/2019   Hypokalemia    Pain of upper abdomen    Syncope and collapse 11/07/2019   Bradycardia on ECG 11/07/2019   Upper respiratory infection 10/03/2019   Lymphocytosis 09/20/2019   Neutropenia ([place]) 09/01/2019   Allergic rhinitis    OSA (obstructive sleep apnea) 02/20/2019   HSV (herpes simplex virus) anogenital infection  08/31/2018   Chronic constipation 05/31/2018   Eczema 01/09/2016   GERD (gastroesophageal reflux disease)-probable paroxsysmal relaxation LES 03/14/2012   Hair loss 06/29/2011   GERD 02/28/2011   VARICOSE VEINS, LOWER EXTREMITIES 01/08/2008   INTESTINAL VOLVULUS, LARGE BOWEL 01/08/2008   Anxiety 01/07/2008   IBS (irritable bowel syndrome) 01/07/2008   MIGRAINES, HX OF 01/07/2008   ATRIAL SEPTAL DEFECT - Status Post Repair 01/08/1988   Past Surgical History:  Procedure Laterality Date   ABDOMINAL HYSTERECTOMY  2004   ASD REPAIR, SECUNDUM  1990   Dr. Jackie Plum SINUPLASTY Left    COLONOSCOPY     ENDOVENOUS ABLATION SAPHENOUS VEIN W/ LASER Left 09/03/2020   EVLA  LSSV  and stab phlebectomy  10-20   EYE SURGERY     muscle reattachment 02/2017   hysterectomy for severe endometriosis     NASAL SEPTUM SURGERY     Dr. Georgia Lopes   OPEN REDUCTION INTERNAL FIXATION (ORIF) DISTAL RADIAL FRACTURE Right 06/20/2017   Procedure: OPEN REDUCTION INTERNAL FIXATION (ORIF) DISTAL RADIAL FRACTURE;  Surgeon: Leanora Cover, MD;  Location: [facility];  Service: Orthopedics;  Laterality: Right;   right ankle surgery  2010   right leg fracture with surgery     done with ankle surgery   TRANSTHORACIC ECHOCARDIOGRAM   January 2012   Normal LV size and function, EF greater than 55%. Mild LA dilation. No intra-atrial shunt with bubble study. Normal pulmonary pressures. Only trace to mild mitral and tricuspid regurgitation.   TRANSTHORACIC ECHOCARDIOGRAM  11/07/2019  EF 60 to 55%.  LVH.  GR 1 DD, with moderate LA dilation.  Relatively normal valves.  RA pressure estimated 8 mmHg.   UPPER GASTROINTESTINAL ENDOSCOPY     varicose laser vein surgery  2009   Family History  Problem Relation Age of Onset   Diabetes Mother    Colon cancer Paternal Grandfather    Heart disease Maternal Grandfather    Prostate cancer Maternal Grandfather    Lung cancer Maternal Grandmother        with mets  to the brain   Clotting disorder Maternal Grandmother    Clotting disorder Sister    Heart disease Sister    Colon polyps Neg Hx    Esophageal cancer Neg Hx    Rectal cancer Neg Hx    Stomach cancer Neg Hx    Outpatient Medications Prior to Visit  Medication Sig Dispense Refill   acetaminophen (TYLENOL) 500 MG tablet Take 1,000 mg by mouth every 6 (six) hours as needed for mild pain.     b complex vitamins capsule Take 1 capsule by mouth daily.     Calcium Carb-Cholecalciferol 600-800 MG-UNIT TABS Take 1 tablet by mouth 2 (two) times daily.     cycloSPORINE (RESTASIS) 0.05 % ophthalmic emulsion Place 1 drop into both eyes 2 (two) times daily. 1.5 mL 5   EPIPEN 2-PAK 0.3 MG/0.3ML SOAJ injection      levocetirizine (XYZAL) 5 MG tablet Take 5 mg by mouth every evening.      LINZESS 145 MCG CAPS capsule TAKE 1 CAPSULE BY MOUTH DAILY BEFORE BREAKFAST. 90 capsule 1   magnesium oxide (MAG-OX) 400 MG tablet Take 400 mg by mouth daily.     methocarbamol (ROBAXIN-750) 750 MG tablet Take 1 tablet (750 mg total) by mouth every 6 (six) hours as needed for muscle spasms. 60 tablet 0   Multiple Vitamin (MULTIVITAMINS PO) Take 1 tablet by mouth daily.     polyethylene glycol (MIRALAX / GLYCOLAX) packet Take 17 g by mouth daily.     valACYclovir (VALTREX) 500 MG tablet TAKE ONE TABLET BY MOUTH ONE TIME DAILY 90 tablet 3   ZINC-VITAMIN C PO Take 2 capsules by mouth daily with breakfast.     No facility-administered medications prior to visit.   Allergies  Allergen Reactions   Chlordiazepoxide-Clidinium Other (See Comments)    "loopy"   Codeine Nausea And Vomiting    Dizziness   Penicillins Hives   Objective:   Today's Vitals   08/10/21 1601  BP: 100/62  Pulse: 70  Resp: (!) 97  Temp: 99.4 F (37.4 C)  TempSrc: Oral   There is no height or weight on file to calculate BMI.   General: Well developed, well nourished. No acute distress. Abdomen: Soft, non-tender. Bowel sounds positive,  normal pitch but mildly hyperactive. No   hepatosplenomegaly. No rebound or guarding. Psych: Alert and oriented. Normal mood and affect.  Health Maintenance Due  Topic Date Due   Pneumococcal Vaccine 104-64 Years old (1 - PCV) Never done   PAP SMEAR-Modifier  05/10/2021   INFLUENZA VACCINE  07/12/2021     Assessment & Plan:   1. Viral gastroenteritis Ms. Forseth appears to have a viral gastroenteritis. Discussed home care for viral illness, including rest, pushing fluids, and OTC medications as needed for symptom relief, including Imodium for the diarrhea.  Follow-up if needed for worsening or persistent symptoms.  Haydee Salter, MD

## 2021-08-10 NOTE — Patient Instructions (Signed)
Viral Gastroenteritis, Adult  Viral gastroenteritis is also known as the stomach flu. This condition may affect your stomach, small intestine, and large intestine. It can cause sudden watery diarrhea, fever, and vomiting. This condition is caused by many different viruses. These viruses can be passed from person to person very easily (are contagious). Diarrhea and vomiting can make you feel weak and cause you to become dehydrated. You may not be able to keep fluids down. Dehydration can make you tired and thirsty, cause you to have a dry mouth, and decrease how often you urinate. It is important to replace the fluids that you lose from diarrhea andvomiting. What are the causes? Gastroenteritis is caused by many viruses, including rotavirus and norovirus. Norovirus is the most common cause in adults. You can get sick after being exposed to the viruses from other people. You can also get sick by: Eating food, drinking water, or touching a surface contaminated with one of these viruses. Sharing utensils or other personal items with an infected person. What increases the risk? You are more likely to develop this condition if you: Have a weak body defense system (immune system). Live with one or more children who are younger than 42 years old. Live in a nursing home. Travel on cruise ships. What are the signs or symptoms? Symptoms of this condition start suddenly 1-3 days after exposure to a virus. Symptoms may last for a few days or for as long as a week. Common symptoms include watery diarrhea and vomiting. Other symptoms include: Fever. Headache. Fatigue. Pain in the abdomen. Chills. Weakness. Nausea. Muscle aches. Loss of appetite. How is this diagnosed? This condition is diagnosed with a medical history and physical exam. You mayalso have a stool test to check for viruses or other infections. How is this treated? This condition typically goes away on its own. The focus of treatment is to  prevent dehydration and restore lost fluids (rehydration). This condition may be treated with: An oral rehydration solution (ORS) to replace important salts and minerals (electrolytes) in your body. Take this if told by your health care provider. This is a drink that is sold at pharmacies and retail stores. Medicines to help with your symptoms. Probiotic supplements to reduce symptoms of diarrhea. Fluids given through an IV, if dehydration is severe. Older adults and people with other diseases or a weak immune system are athigher risk for dehydration. Follow these instructions at home:  Eating and drinking  Take an ORS as told by your health care provider. Drink clear fluids in small amounts as you are able. Clear fluids include: Water. Ice chips. Diluted fruit juice. Low-calorie sports drinks. Drink enough fluid to keep your urine pale yellow. Eat small amounts of healthy foods every 3-4 hours as you are able. This may include whole grains, fruits, vegetables, lean meats, and yogurt. Avoid fluids that contain a lot of sugar or caffeine, such as energy drinks, sports drinks, and soda. Avoid spicy or fatty foods. Avoid alcohol.  General instructions Wash your hands often, especially after having diarrhea or vomiting. If soap and water are not available, use hand sanitizer. Make sure that all people in your household wash their hands well and often. Take over-the-counter and prescription medicines only as told by your health care provider. Rest at home while you recover. Watch your condition for any changes. Take a warm bath to relieve any burning or pain from frequent diarrhea episodes. Keep all follow-up visits as told by your health care provider. This  is important. Contact a health care provider if you: Cannot keep fluids down. Have symptoms that get worse. Have new symptoms. Feel light-headed or dizzy. Have muscle cramps. Get help right away if you: Have chest pain. Feel  extremely weak or you faint. See blood in your vomit. Have vomit that looks like coffee grounds. Have bloody or black stools or stools that look like tar. Have a severe headache, a stiff neck, or both. Have a rash. Have severe pain, cramping, or bloating in your abdomen. Have trouble breathing or you are breathing very quickly. Have a fast heartbeat. Have skin that feels cold and clammy. Feel confused. Have pain when you urinate. Have signs of dehydration, such as: Dark urine, very little urine, or no urine. Cracked lips. Dry mouth. Sunken eyes. Sleepiness. Weakness. Summary Viral gastroenteritis is also known as the stomach flu. It can cause sudden watery diarrhea, fever, and vomiting. This condition can be passed from person to person very easily (is contagious). Take an ORS if told by your health care provider. This is a drink that is sold at pharmacies and retail stores. Wash your hands often, especially after having diarrhea or vomiting. If soap and water are not available, use hand sanitizer. This information is not intended to replace advice given to you by your health care provider. Make sure you discuss any questions you have with your healthcare provider. Document Revised: 05/17/2019 Document Reviewed: 10/03/2018 Elsevier Patient Education  2022 [employer].

## 2021-08-17 ENCOUNTER — Other Ambulatory Visit: Payer: Self-pay

## 2021-08-17 ENCOUNTER — Ambulatory Visit: Payer: Managed Care, Other (non HMO) | Admitting: Family Medicine

## 2021-08-17 ENCOUNTER — Encounter: Payer: Self-pay | Admitting: Family Medicine

## 2021-08-17 VITALS — BP 110/68 | HR 63 | Temp 97.6°F | Ht 70.0 in | Wt 178.2 lb

## 2021-08-17 DIAGNOSIS — Z0000 Encounter for general adult medical examination without abnormal findings: Secondary | ICD-10-CM

## 2021-08-17 DIAGNOSIS — Z23 Encounter for immunization: Secondary | ICD-10-CM | POA: Diagnosis not present

## 2021-08-17 NOTE — Progress Notes (Signed)
Established Patient Office Visit  Subjective:  Patient ID: [name], female    DOB: October 07, 1961  Age: 60 y.o. MRN: [id number]  CC:  Chief Complaint  Patient presents with   Follow-up    Follow up on right hip per patient much improved. No concerns.    HPI [name] presents for follow-up on viral gastroenteritis, recent COVID infection and right hip pain.  They have all resolved.  Hematology recommended reassurance with her recent follow-up for lymphocytosis.  She will have the flu vaccine today.  Status post Shingrix vaccine x2.  She exercises regularly.  She gets 7 hours of sleep nightly on average.  She is minimize the stress in her life.  She asks what she can do to stay healthy and I recommended all of the above with regular health checks and follow-up on health maintenance.  Past Medical History:  Diagnosis Date   Allergic rhinitis    Allergy    dust   Anxiety    Back pain    Contact lens/glasses fitting    Depression    Endometriosis    Fatigue    GERD (gastroesophageal reflux disease)    Hiatal hernia    History of Ostium Secundum Atrial Septal Defect 1989   Status post repair in 1989/1990   Hx of migraines    IBS (irritable bowel syndrome)    Incontinence    Intestinal volvulus ([place]) May 2007   Loss of appetite    Monoclonal B-cell lymphocytosis    Onychomycosis    Osteopenia of femoral neck 02/24/2020   Sleep apnea    uses mouthguard   Syncope 11/07/2019   Varicose veins     Past Surgical History:  Procedure Laterality Date   ABDOMINAL HYSTERECTOMY  2004   ASD REPAIR, SECUNDUM  1990   Dr. Jackie Plum SINUPLASTY Left    COLONOSCOPY     ENDOVENOUS ABLATION SAPHENOUS VEIN W/ LASER Left 09/03/2020   EVLA  LSSV  and stab phlebectomy  10-20   EYE SURGERY     muscle reattachment 02/2017   hysterectomy for severe endometriosis     NASAL SEPTUM SURGERY     Dr. Georgia Lopes   OPEN REDUCTION INTERNAL FIXATION (ORIF) DISTAL RADIAL FRACTURE  Right 06/20/2017   Procedure: OPEN REDUCTION INTERNAL FIXATION (ORIF) DISTAL RADIAL FRACTURE;  Surgeon: Leanora Cover, MD;  Location: [facility];  Service: Orthopedics;  Laterality: Right;   right ankle surgery  2010   right leg fracture with surgery     done with ankle surgery   TRANSTHORACIC ECHOCARDIOGRAM   January 2012   Normal LV size and function, EF greater than 55%. Mild LA dilation. No intra-atrial shunt with bubble study. Normal pulmonary pressures. Only trace to mild mitral and tricuspid regurgitation.   TRANSTHORACIC ECHOCARDIOGRAM  11/07/2019   EF 60 to 55%.  LVH.  GR 1 DD, with moderate LA dilation.  Relatively normal valves.  RA pressure estimated 8 mmHg.   UPPER GASTROINTESTINAL ENDOSCOPY     varicose laser vein surgery  2009    Family History  Problem Relation Age of Onset   Diabetes Mother    Colon cancer Paternal Grandfather    Heart disease Maternal Grandfather    Prostate cancer Maternal Grandfather    Lung cancer Maternal Grandmother        with mets to the brain   Clotting disorder Maternal Grandmother    Clotting disorder Sister    Heart disease Sister  Colon polyps Neg Hx    Esophageal cancer Neg Hx    Rectal cancer Neg Hx    Stomach cancer Neg Hx     Social History   Socioeconomic History   Marital status: Married    Spouse name: Richard   Number of children: Not on file   Years of education: Not on file   Highest education level: Not on file  Occupational History   Occupation: remodel houses    Employer: [employer]  Tobacco Use   Smoking status: Former    Packs/day: 1.00    Years: 10.00    Pack years: 10.00    Types: Cigarettes    Quit date: 12/12/1988    Years since quitting: 32.7   Smokeless tobacco: Never  Vaping Use   Vaping Use: Never used  Substance and Sexual Activity   Alcohol use: Yes    Comment: social use/wine rare   Drug use: No   Sexual activity: Not on file  Other Topics Concern   Not on file   Social History Narrative   They have 2 children and at least two grandchildren. She does all her activities of daily living. She works as a Midwife for [employer].     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 She does other days of the gym and she is able to at least 60 minutes a day 5 days a week.   She is a former smoker who quit in 1990. This was after smoking a pack a day for 10 years. She drinks social alcohol on occasion.   Social Determinants of Health   Financial Resource Strain: Not on file  Food Insecurity: Not on file  Transportation Needs: Not on file  Physical Activity: Not on file  Stress: Not on file  Social Connections: Not on file  Intimate Partner Violence: Not on file    Outpatient Medications Prior to Visit  Medication Sig Dispense Refill   acetaminophen (TYLENOL) 500 MG tablet Take 1,000 mg by mouth every 6 (six) hours as needed for mild pain.     b complex vitamins capsule Take 1 capsule by mouth daily.     Calcium Carb-Cholecalciferol 600-800 MG-UNIT TABS Take 1 tablet by mouth 2 (two) times daily.     cycloSPORINE (RESTASIS) 0.05 % ophthalmic emulsion Place 1 drop into both eyes 2 (two) times daily. 1.5 mL 5   EPIPEN 2-PAK 0.3 MG/0.3ML SOAJ injection      levocetirizine (XYZAL) 5 MG tablet Take 5 mg by mouth every evening.      LINZESS 145 MCG CAPS capsule TAKE 1 CAPSULE BY MOUTH DAILY BEFORE BREAKFAST. 90 capsule 1   magnesium oxide (MAG-OX) 400 MG tablet Take 400 mg by mouth daily.     methocarbamol (ROBAXIN-750) 750 MG tablet Take 1 tablet (750 mg total) by mouth every 6 (six) hours as needed for muscle spasms. 60 tablet 0   Multiple Vitamin (MULTIVITAMINS PO) Take 1 tablet by mouth daily.     polyethylene glycol (MIRALAX / GLYCOLAX) packet Take 17 g by mouth daily.     valACYclovir (VALTREX) 500 MG tablet TAKE ONE TABLET BY MOUTH ONE TIME DAILY 90  tablet 3   ZINC-VITAMIN C PO Take 2 capsules by mouth daily with breakfast.     No facility-administered medications prior to visit.    Allergies  Allergen Reactions  Chlordiazepoxide-Clidinium Other (See Comments)    "loopy"   Codeine Nausea And Vomiting    Dizziness   Penicillins Hives    ROS Review of Systems  Constitutional:  Negative for chills, diaphoresis, fatigue, fever and unexpected weight change.  HENT: Negative.    Eyes:  Negative for photophobia and visual disturbance.  Respiratory: Negative.    Cardiovascular: Negative.   Gastrointestinal: Negative.   Endocrine: Negative for polyphagia and polyuria.  Genitourinary: Negative.   Psychiatric/Behavioral: Negative.       Objective:    Physical Exam Vitals and nursing note reviewed.  Constitutional:      Appearance: Normal appearance.  HENT:     Head: Normocephalic and atraumatic.  Eyes:     General:        Right eye: No discharge.        Left eye: No discharge.     Conjunctiva/sclera: Conjunctivae normal.  Pulmonary:     Effort: Pulmonary effort is normal.  Neurological:     Mental Status: She is alert and oriented to person, place, and time.  Psychiatric:        Mood and Affect: Mood normal.        Behavior: Behavior normal.    BP 110/68 (BP Location: Left Arm, Patient Position: Sitting, Cuff Size: Normal)   Pulse 63   Temp 97.6 F (36.4 C) (Temporal)   Ht '5\' 10"'$  (1.778 m)   Wt 178 lb 3.2 oz (80.8 kg)   SpO2 98%   BMI 25.57 kg/m  Wt Readings from Last 3 Encounters:  08/17/21 178 lb 3.2 oz (80.8 kg)  07/13/21 173 lb 6.4 oz (78.7 kg)  07/12/21 175 lb (79.4 kg)     Health Maintenance Due  Topic Date Due   Pneumococcal Vaccine 7-16 Years old (1 - PCV) Never done   PAP SMEAR-Modifier  05/10/2021    There are no preventive care reminders to display for this patient.  Lab Results  Component Value Date   TSH 2.50 03/05/2020   Lab Results  Component Value Date   WBC 14.4 (H) 07/12/2021    HGB 14.5 07/12/2021   HCT 44.1 07/12/2021   MCV 95.5 07/12/2021   PLT 306 07/12/2021   Lab Results  Component Value Date   NA 142 07/12/2021   K 4.0 07/12/2021   CO2 33 (H) 07/12/2021   GLUCOSE 103 (H) 07/12/2021   BUN 12 07/12/2021   CREATININE 0.73 07/12/2021   BILITOT 0.8 07/12/2021   ALKPHOS 65 07/12/2021   AST 22 07/12/2021   ALT 20 07/12/2021   PROT 6.9 07/12/2021   ALBUMIN 4.3 07/12/2021   CALCIUM 9.8 07/12/2021   ANIONGAP 5 07/12/2021   GFR 85.77 07/06/2021   Lab Results  Component Value Date   CHOL 151 07/06/2021   Lab Results  Component Value Date   HDL 48.90 07/06/2021   Lab Results  Component Value Date   LDLCALC 86 07/06/2021   Lab Results  Component Value Date   TRIG 77.0 07/06/2021   Lab Results  Component Value Date   CHOLHDL 3 07/06/2021   Lab Results  Component Value Date   HGBA1C 5.5 02/01/2021      Assessment & Plan:   Problem List Items Addressed This Visit       Other   Healthcare maintenance - Primary   Relevant Orders   Flu Vaccine QUAD 6+ mos PF IM (Fluarix Quad PF) (Completed)    No orders of the defined types were placed  in this encounter.   Follow-up: Return if symptoms worsen or fail to improve.    Libby Maw, MD

## 2021-08-24 ENCOUNTER — Telehealth: Payer: Self-pay | Admitting: Gastroenterology

## 2021-08-24 NOTE — Telephone Encounter (Signed)
[name], we received a message via Mychart from this pt. In her message she stated that a nurse called her to schedule an appt. I was not able to find any documentation in her chart pertaining to that. I called pt to inquiry further about the call that she received but the pt was unable to provide more information as her husband was the one who took the message. She was very adamant in me asking further about it. I am sending this message to both of your since I am not sure of who is her main GI MD. Could one of you please call pt back with a resolution? She was very persistent in hearing back from [place]. Thank you.

## 2021-08-24 NOTE — Telephone Encounter (Signed)
Pt was notified that there was no documentation of any phone call.

## 2021-09-09 ENCOUNTER — Other Ambulatory Visit: Payer: Self-pay

## 2021-09-09 ENCOUNTER — Telehealth: Payer: Self-pay | Admitting: Family Medicine

## 2021-09-09 MED ORDER — CYCLOSPORINE 0.05 % OP EMUL
1.0000 [drp] | Freq: Two times a day (BID) | OPHTHALMIC | 1 refills | Status: DC
Start: 2021-09-09 — End: 2022-03-28

## 2021-09-14 ENCOUNTER — Telehealth: Payer: Self-pay | Admitting: Family Medicine

## 2021-09-14 ENCOUNTER — Encounter: Payer: Self-pay | Admitting: Family Medicine

## 2021-09-14 ENCOUNTER — Telehealth (INDEPENDENT_AMBULATORY_CARE_PROVIDER_SITE_OTHER): Payer: Managed Care, Other (non HMO) | Admitting: Family Medicine

## 2021-09-14 DIAGNOSIS — R0981 Nasal congestion: Secondary | ICD-10-CM

## 2021-09-14 NOTE — Telephone Encounter (Signed)
Pharmacy called and said the Rx is the wrong amount for eye drops.

## 2021-09-14 NOTE — Telephone Encounter (Signed)
Returned pharmacist call who was calling to know the quantity of patient eye drops. Verbalized understanding for 30 day supply.

## 2021-09-14 NOTE — Patient Instructions (Signed)
  HOME CARE TIPS:  -continue your allergy pill and flonase  -can use nasal saline a few times per day if you have nasal congestion; sometimes  a short course of Afrin nasal spray for 3 days can help with symptoms as well  -stay hydrated, drink plenty of fluids and eat small healthy meals - avoid dairy  -can take 1000 IU (22mcg) Vit D3 and 100-500 mg of Vit C daily per instructions  -follow up with your doctor in 2-3 days unless improving and feeling better  It was nice to meet you today, and I really hope you are feeling better soon. I help [facility] out with telemedicine visits on Tuesdays and Thursdays and am available for visits on those days. If you have any concerns or questions following this visit please schedule a follow up visit with your Primary Care doctor or seek care at a local urgent care clinic to avoid delays in care.    Seek in person care or schedule a follow up video visit promptly if your symptoms worsen, new concerns arise or you are not improving with treatment. Call 911 and/or seek emergency care if your symptoms are severe or life threatening.

## 2021-09-14 NOTE — Progress Notes (Signed)
Virtual Visit via Video Note  I connected with [name]  on 09/14/21 at  4:40 PM EDT by a video enabled telemedicine application and verified that I am speaking with the correct person using two identifiers.  Location patient: home, [place] Location provider:work or home office Persons participating in the virtual visit: patient, provider  I discussed the limitations of evaluation and management by telemedicine and the availability of in person appointments. The patient expressed understanding and agreed to proceed.   HPI:  Acute telemedicine visit for sinus congestion: -Onset: 2-4 days ago -2x covid tests were negative the last 4 days -Symptoms include: nasal congestion, sneezing, low grade temp 99 -Denies:fever, CP, SOB, NVD, inability to eat/drink/get out of bed -Has tried: musinex -Pertinent past medical history: see below -Pertinent medication allergies:  Allergies  Allergen Reactions   Chlordiazepoxide-Clidinium Other (See Comments)    "loopy"   Codeine Nausea And Vomiting    Dizziness   Penicillins Hives  -COVID-19 vaccine status: 2 doses and 2 boosters  ROS: See pertinent positives and negatives per HPI.  Past Medical History:  Diagnosis Date   Allergic rhinitis    Allergy    dust   Anxiety    Back pain    Contact lens/glasses fitting    Depression    Endometriosis    Fatigue    GERD (gastroesophageal reflux disease)    Hiatal hernia    History of Ostium Secundum Atrial Septal Defect 1989   Status post repair in 1989/1990   Hx of migraines    IBS (irritable bowel syndrome)    Incontinence    Intestinal volvulus ([place]) May 2007   Loss of appetite    Monoclonal B-cell lymphocytosis    Onychomycosis    Osteopenia of femoral neck 02/24/2020   Sleep apnea    uses mouthguard   Syncope 11/07/2019   Varicose veins     Past Surgical History:  Procedure Laterality Date   ABDOMINAL HYSTERECTOMY  2004   ASD REPAIR, SECUNDUM  1990   Dr. Jackie Plum  SINUPLASTY Left    COLONOSCOPY     ENDOVENOUS ABLATION SAPHENOUS VEIN W/ LASER Left 09/03/2020   EVLA  LSSV  and stab phlebectomy  10-20   EYE SURGERY     muscle reattachment 02/2017   hysterectomy for severe endometriosis     NASAL SEPTUM SURGERY     Dr. Georgia Lopes   OPEN REDUCTION INTERNAL FIXATION (ORIF) DISTAL RADIAL FRACTURE Right 06/20/2017   Procedure: OPEN REDUCTION INTERNAL FIXATION (ORIF) DISTAL RADIAL FRACTURE;  Surgeon: Leanora Cover, MD;  Location: [facility];  Service: Orthopedics;  Laterality: Right;   right ankle surgery  2010   right leg fracture with surgery     done with ankle surgery   TRANSTHORACIC ECHOCARDIOGRAM   January 2012   Normal LV size and function, EF greater than 55%. Mild LA dilation. No intra-atrial shunt with bubble study. Normal pulmonary pressures. Only trace to mild mitral and tricuspid regurgitation.   TRANSTHORACIC ECHOCARDIOGRAM  11/07/2019   EF 60 to 55%.  LVH.  GR 1 DD, with moderate LA dilation.  Relatively normal valves.  RA pressure estimated 8 mmHg.   UPPER GASTROINTESTINAL ENDOSCOPY     varicose laser vein surgery  2009     Current Outpatient Medications:    acetaminophen (TYLENOL) 500 MG tablet, Take 1,000 mg by mouth every 6 (six) hours as needed for mild pain., Disp: , Rfl:    b complex vitamins capsule, Take  1 capsule by mouth daily., Disp: , Rfl:    Calcium Carb-Cholecalciferol 600-800 MG-UNIT TABS, Take 1 tablet by mouth 2 (two) times daily., Disp: , Rfl:    cycloSPORINE (RESTASIS) 0.05 % ophthalmic emulsion, Place 1 drop into both eyes 2 (two) times daily., Disp: 1.5 mL, Rfl: 1   EPIPEN 2-PAK 0.3 MG/0.3ML SOAJ injection, , Disp: , Rfl:    levocetirizine (XYZAL) 5 MG tablet, Take 5 mg by mouth every evening. , Disp: , Rfl:    LINZESS 145 MCG CAPS capsule, TAKE 1 CAPSULE BY MOUTH DAILY BEFORE BREAKFAST., Disp: 90 capsule, Rfl: 1   magnesium oxide (MAG-OX) 400 MG tablet, Take 400 mg by mouth daily., Disp: , Rfl:     methocarbamol (ROBAXIN-750) 750 MG tablet, Take 1 tablet (750 mg total) by mouth every 6 (six) hours as needed for muscle spasms., Disp: 60 tablet, Rfl: 0   Multiple Vitamin (MULTIVITAMINS PO), Take 1 tablet by mouth daily., Disp: , Rfl:    polyethylene glycol (MIRALAX / GLYCOLAX) packet, Take 17 g by mouth daily., Disp: , Rfl:    valACYclovir (VALTREX) 500 MG tablet, TAKE ONE TABLET BY MOUTH ONE TIME DAILY, Disp: 90 tablet, Rfl: 3   ZINC-VITAMIN C PO, Take 2 capsules by mouth daily with breakfast., Disp: , Rfl:   EXAM:  VITALS per patient if applicable:  GENERAL: alert, oriented, appears well and in no acute distress  HEENT: atraumatic, conjunttiva clear, no obvious abnormalities on inspection of external nose and ears  NECK: normal movements of the head and neck  LUNGS: on inspection no signs of respiratory distress, breathing rate appears normal, no obvious gross SOB, gasping or wheezing  CV: no obvious cyanosis  MS: moves all visible extremities without noticeable abnormality  PSYCH/NEURO: pleasant and cooperative, no obvious depression or anxiety, speech and thought processing grossly intact  ASSESSMENT AND PLAN:  Discussed the following assessment and plan:  Nasal congestion  -we discussed possible serious and likely etiologies, options for evaluation and workup, limitations of telemedicine visit vs in person visit, treatment, treatment risks and precautions. Pt is agreeable to treatment via telemedicine at this moment. Query VURI, allergic rhinitis vs other. Discussed symptomatic care with nasal saline, antihistamine, nasal decongestant and INS.  Work/School slipped offered:  declined Advised to seek prompt in person care if worsening, new symptoms arise, or if is not improving with treatment. Discussed options for inperson care if PCP office not available. Did let this patient know that I only do telemedicine on Tuesdays and Thursdays for [place]. Advised to schedule follow up  visit with PCP or [facility] if any further questions or concerns to avoid delays in care.   I discussed the assessment and treatment plan with the patient. The patient was provided an opportunity to ask questions and all were answered. The patient agreed with the plan and demonstrated an understanding of the instructions.     Lucretia Kern, DO

## 2021-09-29 ENCOUNTER — Ambulatory Visit: Payer: Managed Care, Other (non HMO) | Admitting: Physician Assistant

## 2021-09-29 ENCOUNTER — Encounter: Payer: Self-pay | Admitting: Physician Assistant

## 2021-09-29 ENCOUNTER — Other Ambulatory Visit: Payer: Managed Care, Other (non HMO)

## 2021-09-29 VITALS — BP 104/64 | HR 76 | Ht 70.0 in | Wt 179.1 lb

## 2021-09-29 DIAGNOSIS — K9041 Non-celiac gluten sensitivity: Secondary | ICD-10-CM | POA: Diagnosis not present

## 2021-09-29 NOTE — Progress Notes (Signed)
Chief Complaint: Gluten sensitivity?  HPI:    [name] is a 60 year old female with a past medical history of GERD, chronic idiopathic constipation, diverticulosis and history of ASD status postrepair, endometriosis, migraines, monoclonal B-cell lymphocytosis on observation, known to Dr. Hilarie Fredrickson, who recently saw Dr. Bryan Lemma for discussion of TIF, who presents to clinic today with a complaint of gluten sensitivity.    04/22/2021 patient saw Dr. Bryan Lemma and discussed that reflux is generally well controlled with Omeprazole 20 mg daily but she was concerned about long-term use of PPI.  At that time was actually feeling well even off of a PPI without any breakthrough symptoms using aggressive dietary/lifestyle modifications.  Recommended she keep the head of her bed elevated given predominance of nocturnal reflux on recent Bravo study.  It was discussed that should she have breakthrough reflux she would restart Omeprazole and could again discuss the role of TIF.    Today, the patient tells me that she continues to do well from a reflux standpoint.  She has no symptoms after changing her diet.  She does ask various questions today in regards to gluten sensitivity.  Tells me that she seems to have some issues when she does eat gluten with constipation and some generalized abdominal pain so she generally tries to avoid this but would like to know if she has true celiac disease or not.    Also discussed her diagnosis of B-cell lymphocytosis and tells me she would like to maybe start a Mediterranean diet as she has heard this is helpful and asked if there is anything else diet wise that could help her.  Apparently has recheck in January to decide if she actually has cancer or not.    Denies fever, chills, weight loss, blood in her stool or change in bowel habits.  Endoscopic History: -EGD (02/2011, Dr. Sharlett Iles): 4 cm HH, erosions at GEJ, normal stomach and duodenum -EGD (11/2019, Dr. Hilarie Fredrickson): Normal  esophagus, fundic gland polyps, mild gastritis - EGD with Bravo (02/2021, Dr. Bryan Lemma): LA Grade A esophagitis, Hill grade 3, fundic gland polyps.  Bravo placed  Past Medical History:  Diagnosis Date   Allergic rhinitis    Allergy    dust   Anxiety    Back pain    Contact lens/glasses fitting    Depression    Endometriosis    Fatigue    GERD (gastroesophageal reflux disease)    Hiatal hernia    History of Ostium Secundum Atrial Septal Defect 1989   Status post repair in 1989/1990   Hx of migraines    IBS (irritable bowel syndrome)    Incontinence    Intestinal volvulus ([place]) May 2007   Loss of appetite    Monoclonal B-cell lymphocytosis    Onychomycosis    Osteopenia of femoral neck 02/24/2020   Sleep apnea    uses mouthguard   Syncope 11/07/2019   Varicose veins     Past Surgical History:  Procedure Laterality Date   ABDOMINAL HYSTERECTOMY  2004   ASD REPAIR, SECUNDUM  1990   Dr. Jackie Plum SINUPLASTY Left    COLONOSCOPY     ENDOVENOUS ABLATION SAPHENOUS VEIN W/ LASER Left 09/03/2020   EVLA  LSSV  and stab phlebectomy  10-20   EYE SURGERY     muscle reattachment 02/2017   hysterectomy for severe endometriosis     NASAL SEPTUM SURGERY     Dr. Georgia Lopes   OPEN REDUCTION INTERNAL FIXATION (ORIF) DISTAL RADIAL FRACTURE Right  06/20/2017   Procedure: OPEN REDUCTION INTERNAL FIXATION (ORIF) DISTAL RADIAL FRACTURE;  Surgeon: Leanora Cover, MD;  Location: [facility];  Service: Orthopedics;  Laterality: Right;   right ankle surgery  2010   right leg fracture with surgery     done with ankle surgery   TRANSTHORACIC ECHOCARDIOGRAM   January 2012   Normal LV size and function, EF greater than 55%. Mild LA dilation. No intra-atrial shunt with bubble study. Normal pulmonary pressures. Only trace to mild mitral and tricuspid regurgitation.   TRANSTHORACIC ECHOCARDIOGRAM  11/07/2019   EF 60 to 55%.  LVH.  GR 1 DD, with moderate LA dilation.  Relatively  normal valves.  RA pressure estimated 8 mmHg.   UPPER GASTROINTESTINAL ENDOSCOPY     varicose laser vein surgery  2009    Current Outpatient Medications  Medication Sig Dispense Refill   acetaminophen (TYLENOL) 500 MG tablet Take 1,000 mg by mouth every 6 (six) hours as needed for mild pain.     Azelastine HCl 137 MCG/SPRAY SOLN Place 1 spray into both nostrils as needed.     b complex vitamins capsule Take 1 capsule by mouth daily.     Calcium Carb-Cholecalciferol 600-800 MG-UNIT TABS Take 1 tablet by mouth 2 (two) times daily.     cycloSPORINE (RESTASIS) 0.05 % ophthalmic emulsion Place 1 drop into both eyes 2 (two) times daily. 1.5 mL 1   levocetirizine (XYZAL) 5 MG tablet Take 5 mg by mouth every evening.      LINZESS 145 MCG CAPS capsule TAKE 1 CAPSULE BY MOUTH DAILY BEFORE BREAKFAST. 90 capsule 1   magnesium oxide (MAG-OX) 400 MG tablet Take 400 mg by mouth daily.     methocarbamol (ROBAXIN-750) 750 MG tablet Take 1 tablet (750 mg total) by mouth every 6 (six) hours as needed for muscle spasms. 60 tablet 0   montelukast (SINGULAIR) 10 MG tablet Take 1 tablet by mouth as needed.     Multiple Vitamin (MULTIVITAMINS PO) Take 1 tablet by mouth daily.     polyethylene glycol (MIRALAX / GLYCOLAX) packet Take 17 g by mouth daily.     valACYclovir (VALTREX) 500 MG tablet TAKE ONE TABLET BY MOUTH ONE TIME DAILY 90 tablet 3   ZINC-VITAMIN C PO Take 2 capsules by mouth daily with breakfast.     EPIPEN 2-PAK 0.3 MG/0.3ML SOAJ injection  (Patient not taking: Reported on 09/29/2021)     No current facility-administered medications for this visit.    Allergies as of 09/29/2021 - Review Complete 09/29/2021  Allergen Reaction Noted   Chlordiazepoxide-clidinium Other (See Comments) 04/26/2011   Codeine Nausea And Vomiting 04/23/2020   Penicillins Hives 04/23/2020    Family History  Problem Relation Age of Onset   Diabetes Mother    Colon cancer Paternal Grandfather    Heart disease Maternal  Grandfather    Prostate cancer Maternal Grandfather    Lung cancer Maternal Grandmother        with mets to the brain   Clotting disorder Maternal Grandmother    Clotting disorder Sister    Heart disease Sister    Colon polyps Neg Hx    Esophageal cancer Neg Hx    Rectal cancer Neg Hx    Stomach cancer Neg Hx     Social History   Socioeconomic History   Marital status: Married    Spouse name: Richard   Number of children: Not on file   Years of education: Not on file  Highest education level: Not on file  Occupational History   Occupation: remodel houses    Employer: [employer]  Tobacco Use   Smoking status: Former    Packs/day: 1.00    Years: 10.00    Pack years: 10.00    Types: Cigarettes    Quit date: 12/12/1988    Years since quitting: 32.8   Smokeless tobacco: Never  Vaping Use   Vaping Use: Never used  Substance and Sexual Activity   Alcohol use: Yes    Comment: social use/wine rare   Drug use: No   Sexual activity: Not on file  Other Topics Concern   Not on file  Social History Narrative   They have 2 children and at least two grandchildren. She does all her activities of daily living. She works as a Midwife for [employer].     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 She does other days of the gym and she is able to at least 60 minutes a day 5 days a week.   She is a former smoker who quit in 1990. This was after smoking a pack a day for 10 years. She drinks social alcohol on occasion.   Social Determinants of Health   Financial Resource Strain: Not on file  Food Insecurity: Not on file  Transportation Needs: Not on file  Physical Activity: Not on file  Stress: Not on file  Social Connections: Not on file  Intimate Partner Violence: Not on file    Review of Systems:    Constitutional: No weight loss, fever or chills Cardiovascular:  No chest pain Respiratory: No SOB  Gastrointestinal: See HPI and otherwise negative   Physical Exam:  Vital signs: BP 104/64 (BP Location: Left Arm, Patient Position: Sitting, Cuff Size: Normal)   Pulse 76   Ht 5\' 10"  (1.778 m) Comment: height measured without shoes  Wt 179 lb 2 oz (81.3 kg)   BMI 25.70 kg/m   Constitutional:   Pleasant Caucasian female appears to be in NAD, Well developed, Well nourished, alert and cooperative Respiratory: Respirations even and unlabored. Lungs clear to auscultation bilaterally.   No wheezes, crackles, or rhonchi.  Cardiovascular: Normal S1, S2. No MRG. Regular rate and rhythm. No peripheral edema, cyanosis or pallor.  Gastrointestinal:  Soft, nondistended, nontender. No rebound or guarding. Normal bowel sounds. No appreciable masses or hepatomegaly. Rectal:  Not performed.  Psychiatric: Oriented to person, place and time. Demonstrates good judgement and reason without abnormal affect or behaviors.  RELEVANT LABS AND IMAGING: CBC    Component Value Date/Time   WBC 14.4 (H) 07/12/2021 1102   WBC 12.7 (H) 07/06/2021 0932   RBC 4.62 07/12/2021 1102   HGB 14.5 07/12/2021 1102   HCT 44.1 07/12/2021 1102   PLT 306 07/12/2021 1102   MCV 95.5 07/12/2021 1102   MCH 31.4 07/12/2021 1102   MCHC 32.9 07/12/2021 1102   RDW 12.7 07/12/2021 1102   LYMPHSABS 7.9 (H) 07/12/2021 1102   MONOABS 0.8 07/12/2021 1102   EOSABS 0.2 07/12/2021 1102   BASOSABS 0.1 07/12/2021 1102    CMP     Component Value Date/Time   NA 142 07/12/2021 1102   K 4.0 07/12/2021 1102   CL 104 07/12/2021 1102   CO2 33 (H) 07/12/2021 1102   GLUCOSE 103 (H) 07/12/2021 1102   BUN 12 07/12/2021 1102  CREATININE 0.73 07/12/2021 1102   CALCIUM 9.8 07/12/2021 1102   PROT 6.9 07/12/2021 1102   ALBUMIN 4.3 07/12/2021 1102   AST 22 07/12/2021 1102   ALT 20 07/12/2021 1102   ALKPHOS 65 07/12/2021 1102   BILITOT 0.8 07/12/2021 1102   GFRNONAA >60 07/12/2021 1102   GFRAA >60  04/23/2020 1149    Assessment: 1.  Gluten sensitivity: Seems to have some abdominal pain and constipation when she eats gluten  Plan: 1.  Ordered celiac labs today. 2.  Discussed that there are various thoughts on diet and cancer.  Discussed that generally decreased sugar intake is found to be helpful.  If she would like to try the Mediterranean diet I think this would be good for her.  In general if gluten seems to give her symptoms and would recommend avoiding it. 3.  Patient to follow in clinic with [place] as needed.  Ellouise Newer, PA-C [facility] 09/29/2021, 2:28 PM  Cc: Libby Maw

## 2021-09-29 NOTE — Patient Instructions (Signed)
Your provider has requested that you go to the basement level for lab work before leaving today. Press "B" on the elevator. The lab is located at the first door on the left as you exit the elevator.  If you are age 62 or older, your body mass index should be between 23-30. Your Body mass index is 25.7 kg/m. If this is out of the aforementioned range listed, please consider follow up with your Primary Care Provider.  If you are age 64 or younger, your body mass index should be between 19-25. Your Body mass index is 25.7 kg/m. If this is out of the aformentioned range listed, please consider follow up with your Primary Care Provider.   ________________________________________________________  The [facility] providers would like to encourage you to use MYCHART to communicate with providers for non-urgent requests or questions.  Due to long hold times on the telephone, sending your provider a message by MYCHART may be a faster and more efficient way to get a response.  Please allow 48 business hours for a response.  Please remember that this is for non-urgent requests.  _______________________________________________________  

## 2021-09-29 NOTE — Addendum Note (Signed)
Addended by: Azzie Almas on: 09/29/2021 02:54 PM   Modules accepted: Orders

## 2021-09-30 LAB — TISSUE TRANSGLUTAMINASE ABS,IGG,IGA
(tTG) Ab, IgA: <1 U/mL
(tTG) Ab, IgG: <1 U/mL

## 2021-09-30 LAB — IGA: Immunoglobulin A: 242 mg/dL (ref 70–320)

## 2021-10-06 NOTE — Progress Notes (Signed)
Addendum: Reviewed and agree with assessment and management plan. Oyinkansola Truax M, MD  

## 2021-11-20 ENCOUNTER — Encounter: Payer: Self-pay | Admitting: Hematology & Oncology

## 2021-12-16 ENCOUNTER — Inpatient Hospital Stay: Payer: Managed Care, Other (non HMO) | Admitting: Hematology & Oncology

## 2021-12-16 ENCOUNTER — Inpatient Hospital Stay: Payer: Managed Care, Other (non HMO) | Attending: Hematology & Oncology

## 2021-12-16 ENCOUNTER — Other Ambulatory Visit: Payer: Self-pay

## 2021-12-16 ENCOUNTER — Encounter: Payer: Self-pay | Admitting: Hematology & Oncology

## 2021-12-16 VITALS — BP 117/56 | HR 64 | Temp 98.2°F | Resp 18 | Wt 179.0 lb

## 2021-12-16 DIAGNOSIS — Z13220 Encounter for screening for lipoid disorders: Secondary | ICD-10-CM | POA: Insufficient documentation

## 2021-12-16 DIAGNOSIS — C9110 Chronic lymphocytic leukemia of B-cell type not having achieved remission: Secondary | ICD-10-CM

## 2021-12-16 DIAGNOSIS — Z7189 Other specified counseling: Secondary | ICD-10-CM

## 2021-12-16 DIAGNOSIS — D72820 Lymphocytosis (symptomatic): Secondary | ICD-10-CM

## 2021-12-16 HISTORY — DX: Other specified counseling: Z71.89

## 2021-12-16 HISTORY — DX: Chronic lymphocytic leukemia of B-cell type not having achieved remission: C91.10

## 2021-12-16 LAB — CBC WITH DIFFERENTIAL (CANCER CENTER ONLY)
Abs Immature Granulocytes: 0.03 10*3/uL (ref 0.00–0.07)
Basophils Absolute: 0.1 10*3/uL (ref 0.0–0.1)
Basophils Relative: 1 %
Eosinophils Absolute: 0.2 10*3/uL (ref 0.0–0.5)
Eosinophils Relative: 1 %
HCT: 43.5 % (ref 36.0–46.0)
Hemoglobin: 14.3 g/dL (ref 12.0–15.0)
Immature Granulocytes: 0 %
Lymphocytes Relative: 71 %
Lymphs Abs: 10.9 10*3/uL — ABNORMAL HIGH (ref 0.7–4.0)
MCH: 30.9 pg (ref 26.0–34.0)
MCHC: 32.9 g/dL (ref 30.0–36.0)
MCV: 94 fL (ref 80.0–100.0)
Monocytes Absolute: 0.7 10*3/uL (ref 0.1–1.0)
Monocytes Relative: 5 %
Neutro Abs: 3.3 10*3/uL (ref 1.7–7.7)
Neutrophils Relative %: 22 %
Platelet Count: 301 10*3/uL (ref 150–400)
RBC: 4.63 MIL/uL (ref 3.87–5.11)
RDW: 13.2 % (ref 11.5–15.5)
Smear Review: NORMAL
WBC Count: 15.2 10*3/uL — ABNORMAL HIGH (ref 4.0–10.5)
nRBC: 0 % (ref 0.0–0.2)

## 2021-12-16 LAB — CMP (CANCER CENTER ONLY)
ALT: 36 U/L (ref 0–44)
AST: 30 U/L (ref 15–41)
Albumin: 4.5 g/dL (ref 3.5–5.0)
Alkaline Phosphatase: 62 U/L (ref 38–126)
Anion gap: 7 (ref 5–15)
BUN: 15 mg/dL (ref 8–23)
CO2: 31 mmol/L (ref 22–32)
Calcium: 10.7 mg/dL — ABNORMAL HIGH (ref 8.9–10.3)
Chloride: 103 mmol/L (ref 98–111)
Creatinine: 0.76 mg/dL (ref 0.44–1.00)
GFR, Estimated: >60 mL/min (ref >60)
Glucose, Bld: 106 mg/dL — ABNORMAL HIGH (ref 70–99)
Potassium: 3.6 mmol/L (ref 3.5–5.1)
Sodium: 141 mmol/L (ref 135–145)
Total Bilirubin: 1 mg/dL (ref 0.3–1.2)
Total Protein: 6.8 g/dL (ref 6.5–8.1)

## 2021-12-16 LAB — SAVE SMEAR(SSMR), FOR PROVIDER SLIDE REVIEW

## 2021-12-16 LAB — LACTATE DEHYDROGENASE: LDH: 152 U/L (ref 98–192)

## 2021-12-16 NOTE — Progress Notes (Signed)
Hematology and Oncology Follow Up Visit  [name] [id number] 1961-11-29 61 y.o. 12/16/2021   Principle Diagnosis:  CLL-  Stage A  --  13q-  Current Therapy:   Observation     Interim History:  [name] is back for a follow-up.  We saw her about 5 months ago.  She is doing quite well.  She still working full-time.  She had no problems over the holiday.  She comes in with her husband.  They are planning on going to [place] in early April.  She has had no problems with infections.  I think she did have COVID back in August.  It was very mild.  She has had no problems with nausea or vomiting.  She has had no change in bowel or bladder habits.  I think she had a colonoscopy couple years ago..  She is up-to-date with her mammograms.  There is been no problems with fever.  She has had no rashes.  She has had no leg swelling.  She has had no headache.  Overall, I would have to say her performance status is ECOG 0.     Medications:  Current Outpatient Medications:    acetaminophen (TYLENOL) 500 MG tablet, Take 1,000 mg by mouth every 6 (six) hours as needed for mild pain., Disp: , Rfl:    Azelastine HCl 137 MCG/SPRAY SOLN, Place 1 spray into both nostrils as needed., Disp: , Rfl:    b complex vitamins capsule, Take 1 capsule by mouth daily., Disp: , Rfl:    Calcium Carb-Cholecalciferol 600-800 MG-UNIT TABS, Take 1 tablet by mouth 2 (two) times daily., Disp: , Rfl:    cycloSPORINE (RESTASIS) 0.05 % ophthalmic emulsion, Place 1 drop into both eyes 2 (two) times daily., Disp: 1.5 mL, Rfl: 1   EPIPEN 2-PAK 0.3 MG/0.3ML SOAJ injection, , Disp: , Rfl:    levocetirizine (XYZAL) 5 MG tablet, Take 5 mg by mouth every evening. , Disp: , Rfl:    LINZESS 145 MCG CAPS capsule, TAKE 1 CAPSULE BY MOUTH DAILY BEFORE BREAKFAST., Disp: 90 capsule, Rfl: 1   magnesium oxide (MAG-OX) 400 MG tablet, Take 400 mg by mouth daily., Disp: , Rfl:    methocarbamol (ROBAXIN-750) 750 MG tablet, Take  1 tablet (750 mg total) by mouth every 6 (six) hours as needed for muscle spasms., Disp: 60 tablet, Rfl: 0   montelukast (SINGULAIR) 10 MG tablet, Take 1 tablet by mouth as needed., Disp: , Rfl:    montelukast (SINGULAIR) 10 MG tablet, Take 1 tablet by mouth every evening., Disp: , Rfl:    Multiple Vitamin (MULTIVITAMINS PO), Take 1 tablet by mouth daily., Disp: , Rfl:    polyethylene glycol (MIRALAX / GLYCOLAX) packet, Take 17 g by mouth daily., Disp: , Rfl:    valACYclovir (VALTREX) 500 MG tablet, TAKE ONE TABLET BY MOUTH ONE TIME DAILY, Disp: 90 tablet, Rfl: 3   ZINC-VITAMIN C PO, Take 2 capsules by mouth daily with breakfast., Disp: , Rfl:   Allergies:  Allergies  Allergen Reactions   Chlordiazepoxide-Clidinium Other (See Comments)    "loopy"   Codeine Nausea And Vomiting    Dizziness   Penicillins Hives    Past Medical History, Surgical history, Social history, and Family History were reviewed and updated.  Review of Systems: Review of Systems  Constitutional: Negative.   HENT:  Negative.    Eyes: Negative.   Respiratory: Negative.    Cardiovascular: Negative.   Gastrointestinal: Negative.   Endocrine: Negative.  Genitourinary: Negative.    Musculoskeletal: Negative.   Skin: Negative.   Neurological: Negative.   Hematological: Negative.   Psychiatric/Behavioral: Negative.     Physical Exam:  weight is 179 lb (81.2 kg). Her oral temperature is 98.2 F (36.8 C). Her blood pressure is 117/56 (abnormal) and her pulse is 64. Her respiration is 18 and oxygen saturation is 100%.   Wt Readings from Last 3 Encounters:  12/16/21 179 lb (81.2 kg)  09/29/21 179 lb 2 oz (81.3 kg)  08/17/21 178 lb 3.2 oz (80.8 kg)    Physical Exam Vitals reviewed.  HENT:     Head: Normocephalic and atraumatic.  Eyes:     Pupils: Pupils are equal, round, and reactive to light.  Cardiovascular:     Rate and Rhythm: Normal rate and regular rhythm.     Heart sounds: Normal heart sounds.   Pulmonary:     Effort: Pulmonary effort is normal.     Breath sounds: Normal breath sounds.  Abdominal:     General: Bowel sounds are normal.     Palpations: Abdomen is soft.  Musculoskeletal:        General: No tenderness or deformity. Normal range of motion.     Cervical back: Normal range of motion.  Lymphadenopathy:     Cervical: No cervical adenopathy.  Skin:    General: Skin is warm and dry.     Findings: No erythema or rash.  Neurological:     Mental Status: She is alert and oriented to person, place, and time.  Psychiatric:        Behavior: Behavior normal.        Thought Content: Thought content normal.        Judgment: Judgment normal.     Lab Results  Component Value Date   WBC 15.2 (H) 12/16/2021   HGB 14.3 12/16/2021   HCT 43.5 12/16/2021   MCV 94.0 12/16/2021   PLT 301 12/16/2021     Chemistry      Component Value Date/Time   NA 141 12/16/2021 1440   K 3.6 12/16/2021 1440   CL 103 12/16/2021 1440   CO2 31 12/16/2021 1440   BUN 15 12/16/2021 1440   CREATININE 0.76 12/16/2021 1440      Component Value Date/Time   CALCIUM 10.7 (H) 12/16/2021 1440   ALKPHOS 62 12/16/2021 1440   AST 30 12/16/2021 1440   ALT 36 12/16/2021 1440   BILITOT 1.0 12/16/2021 1440      Impression and Plan: [name] is a very charming 61 year old white female.  She has CLL.  I think this is can be a low risk CLL.  For right now, I would just continue to follow her along.  I really do not see a problem with her with CLL.  I doubt that she would ever need treatment for this at least for several years.  I told her that she can stay active and that she can eat well this will certainly help with the CLL.  I would like to see her back in 6 months.  I think this would be very reasonable.     Volanda Napoleon, MD 1/5/20233:59 PM

## 2021-12-21 ENCOUNTER — Encounter: Payer: Self-pay | Admitting: Hematology & Oncology

## 2021-12-21 ENCOUNTER — Ambulatory Visit: Payer: Managed Care, Other (non HMO) | Admitting: Cardiology

## 2021-12-21 ENCOUNTER — Other Ambulatory Visit: Payer: Self-pay

## 2021-12-21 ENCOUNTER — Encounter: Payer: Self-pay | Admitting: Cardiology

## 2021-12-21 VITALS — BP 110/62 | HR 51 | Ht 70.0 in | Wt 179.4 lb

## 2021-12-21 DIAGNOSIS — I951 Orthostatic hypotension: Secondary | ICD-10-CM

## 2021-12-21 DIAGNOSIS — Q2111 Secundum atrial septal defect: Secondary | ICD-10-CM | POA: Diagnosis not present

## 2021-12-21 DIAGNOSIS — I4729 Other ventricular tachycardia: Secondary | ICD-10-CM | POA: Diagnosis not present

## 2021-12-21 DIAGNOSIS — R001 Bradycardia, unspecified: Secondary | ICD-10-CM

## 2021-12-21 DIAGNOSIS — I8390 Asymptomatic varicose veins of unspecified lower extremity: Secondary | ICD-10-CM

## 2021-12-21 DIAGNOSIS — R55 Syncope and collapse: Secondary | ICD-10-CM

## 2021-12-21 NOTE — Patient Instructions (Addendum)
Medication Instructions:   No changes  *If you need a refill on your cardiac medications before your next appointment, please call your pharmacy*   Lab Work:  Not neede   Testing/Procedures: Not  needed   Follow-Up: At [facility], you and your health needs are our priority.  As part of our continuing mission to provide you with exceptional heart care, we have created designated Provider Care Teams.  These Care Teams include your primary Cardiologist (physician) and Advanced Practice Providers (APPs -  Physician Assistants and Nurse Practitioners) who all work together to provide you with the care you need, when you need it.     Your next appointment:   12 month(s)  The format for your next appointment:   In Person  Provider:   None    Other Instructions  [place]!!!

## 2021-12-21 NOTE — Progress Notes (Signed)
Primary Care Provider: Libby Maw, MD Cardiologist: Glenetta Hew, MD Electrophysiologist: None  Clinic Note: Chief Complaint  Patient presents with   Follow-up    Doing well.  The diagnosis of CLL   ===================================  ASSESSMENT/PLAN   Problem List Items Addressed This Visit       Cardiology Problems   ATRIAL SEPTAL DEFECT - Status Post Repair - Primary (Chronic)    Well-healed.  No further shunting noted on echocardiogram most recently checked in November 2020.  Can probably wait till 2025 to reassess.      Relevant Orders   EKG 12-Lead (Completed)   Nonsustained ventricular tachycardia    1 short asymptomatic episode noted on the monitor.  Normal echocardiogram and nonischemic Myoview.  Likely benign.      Relevant Orders   EKG 12-Lead (Completed)   VARICOSE VEINS, LOWER EXTREMITIES (Chronic)    Now status post left SSV ablation.  Left GSV looked okay.  Edema better.  Continue foot elevation.      Hypotension (Chronic)    No longer hypotensive.  However would not be aggressive with treating blood pressures on her.        Other   Syncope and collapse    No further episodes of syncope.  Episode that she had was quite likely micturition syncope as complication of vasovagal syncope.  Think continue aggressive hydration and avoiding triggers.      Bradycardia on ECG (Chronic)    Completely asymptomatic - No chronotropic intolerance noted on monitor  Avoid AVN agents.       ===================================  HPI:    [name] is a 61 y.o. female with a PMH notable for history of ASD repair as well as an episode of Syncope (most likely micturition syncope), large.  Venous reflux (now status post bilateral saphenous vein ablation), and bradycardia.  She presents today for 36-month follow-up  She was a former long-standing patient of Dr. Aldona Bar from the 1990s had a history of ASD repair by Dr. Servando Snare in the  late 1989. She is also had varicose vein ablation surgery on RLE by Dr. Donnetta Hutching in 2008; and most recently left small saphenous vein ablation in September 2022  RANDEE HUSTON was last seen July 2021 in follow-up from a syncopal episode with event monitor and Myoview reviewed. => Normal echo, and nonischemic Myoview, Low Risk-ordered to evaluate 11 beat run of NSVT on monitor.  Otherwise monitor showed rate range of 43 to 148 bpm and average 68 bpm. => No further syncopal episodes.  No palpitations or any sensation of significant arrhythmia.  Maintaining adequate hydration.  Tolerating low heart rate. Single episode felt to be post micturition/vasovagal-encouraged adequate hydration.  Recent Hospitalizations: None  Reviewed  CV studies:    The following studies were reviewed today: (if available, images/films reviewed: From Epic Chart or Care Everywhere) Left Lower Extremity Venous Duplex (04/30/2020): No evidence of DVT in the common femoral or popliteal arteries.  No superficial venous thrombosis.  No evidence of deep vein reflux.  There is superficial vein reflux in the small saphenous vein (SSV) but no reflux in the SFJ or GSV. 09/10/2020-Left Lower Venous Reflux Duplex (postablation of the Left SSV): No DVT in the left lower extremity from common femoral to popliteal veins.  Successful laser ablation of Left SSV from mid calf to ~1.6 cm from saphenopopliteal junction.  Interval History:   [name] returns here today pretty stable from a cardiac standpoint.  She  is little bit upset because she was just diagnosed with CLL (follows with Dr. Marin Olp.  Likely progression of the monoclonal B-cell lymphocytosis noted previously.). They are now in the process of determining game plan.  Importantly, she denies any recurrent syncopal episodes.  Staying adequately hydrated.  She seems to be doing better after her ablation to happen shortly after I saw her last time.  She has a few pinpoint areas  of tenderness in her leg, but otherwise edema is definitely controlled.  CV Review of Symptoms (Summary) Cardiovascular ROS: no chest pain or dyspnea on exertion positive for - -trivial left lower extremity swelling.  Notably improved.  Mild pinpoint tenderness.  A little more fatigued than she had usually been.  Some exercise intolerance.. negative for - chest pain, dyspnea on exertion, irregular heartbeat, orthopnea, palpitations, paroxysmal nocturnal dyspnea, rapid heart rate, shortness of breath, or lightheadedness, dizziness or wooziness, syncope/near syncope or TIA/amaurosis fugax.  No prolonged arrhythmia sensations.  No claudication.  REVIEWED OF SYSTEMS   Review of Systems  Constitutional:  Positive for malaise/fatigue (A little more fatigued than usual.).  Respiratory:  Negative for cough and shortness of breath.   Cardiovascular:        Per HPI  Gastrointestinal:  Negative for blood in stool and melena.  Genitourinary:  Negative for dysuria and hematuria.  Musculoskeletal:  Negative for joint pain and myalgias.  Neurological:  Negative for dizziness and headaches.  Psychiatric/Behavioral:  The patient is nervous/anxious (Nervous about her new diagnosis.).    I have reviewed and (if needed) personally updated the patient's problem list, medications, allergies, past medical and surgical history, social and family history.   PAST MEDICAL HISTORY   Past Medical History:  Diagnosis Date   Allergic rhinitis    Allergy    dust   Anxiety    Back pain    CLL (chronic lymphocytic leukemia) ([place]) 12/16/2021   Contact lens/glasses fitting    Depression    Endometriosis    Fatigue    GERD (gastroesophageal reflux disease)    Goals of care, counseling/discussion 12/16/2021   Hiatal hernia    History of Ostium Secundum Atrial Septal Defect 1989   Status post repair in 1989/1990   Hx of migraines    IBS (irritable bowel syndrome)    Incontinence    Intestinal volvulus ([place])  04/2006   Loss of appetite    Monoclonal B-cell lymphocytosis    Onychomycosis    Osteopenia of femoral neck 02/24/2020   Sleep apnea    uses mouthguard   Syncope 11/07/2019   Varicose veins of leg with edema, bilateral    RLE varicose vein ablation in 2008; LLE left SSV ablation September 2021    PAST SURGICAL HISTORY   Past Surgical History:  Procedure Laterality Date   ABDOMINAL HYSTERECTOMY  2004   ASD REPAIR, SECUNDUM  1990   Dr. Jackie Plum SINUPLASTY Left    COLONOSCOPY     ENDOVENOUS ABLATION SAPHENOUS VEIN W/ LASER Left 09/03/2020   EVLA  LSSV  and stab phlebectomy  10-20   EYE SURGERY     muscle reattachment 02/2017   hysterectomy for severe endometriosis     NASAL SEPTUM SURGERY     Dr. Georgia Lopes   OPEN REDUCTION INTERNAL FIXATION (ORIF) DISTAL RADIAL FRACTURE Right 06/20/2017   Procedure: OPEN REDUCTION INTERNAL FIXATION (ORIF) DISTAL RADIAL FRACTURE;  Surgeon: Leanora Cover, MD;  Location: [facility];  Service: Orthopedics;  Laterality: Right;  right ankle surgery  2010   right leg fracture with surgery     done with ankle surgery   TRANSTHORACIC ECHOCARDIOGRAM   January 2012   Normal LV size and function, EF greater than 55%. Mild LA dilation. No intra-atrial shunt with bubble study. Normal pulmonary pressures. Only trace to mild mitral and tricuspid regurgitation.   TRANSTHORACIC ECHOCARDIOGRAM  11/07/2019   EF 60 to 55%.  LVH.  GR 1 DD, with moderate LA dilation.  Relatively normal valves.  RA pressure estimated 8 mmHg.   UPPER GASTROINTESTINAL ENDOSCOPY     varicose laser vein surgery  2009    Immunization History  Administered Date(s) Administered   Influenza Split 10/25/2011, 10/25/2012   Influenza Whole 09/17/2008, 10/08/2009, 11/11/2010   Influenza, High Dose Seasonal PF 10/25/2016, 12/06/2017, 12/22/2017, 12/27/2019, 03/03/2021   Influenza,inj,Quad PF,6+ Mos 11/18/2013, 09/13/2017, 09/12/2018, 08/27/2019, 08/17/2021    Influenza-Unspecified 11/01/2015, 11/01/2015, 09/17/2020   PFIZER Comirnaty(Gray Top)Covid-19 Tri-Sucrose Vaccine 05/03/2021   PFIZER(Purple Top)SARS-COV-2 Vaccination 02/14/2020, 03/07/2020, 08/08/2020, 03/03/2021, 05/03/2021   PNEUMOCOCCAL CONJUGATE-20 07/06/2021   Tdap 01/16/2012   Zoster Recombinat (Shingrix) 03/17/2017, 09/13/2017    MEDICATIONS/ALLERGIES   Current Meds  Medication Sig   acetaminophen (TYLENOL) 500 MG tablet Take 1,000 mg by mouth every 6 (six) hours as needed for mild pain.   Azelastine HCl 137 MCG/SPRAY SOLN Place 1 spray into both nostrils as needed.   b complex vitamins capsule Take 1 capsule by mouth daily.   Calcium Carb-Cholecalciferol 600-800 MG-UNIT TABS Take 1 tablet by mouth 2 (two) times daily.   cycloSPORINE (RESTASIS) 0.05 % ophthalmic emulsion Place 1 drop into both eyes 2 (two) times daily.   EPIPEN 2-PAK 0.3 MG/0.3ML SOAJ injection    levocetirizine (XYZAL) 5 MG tablet Take 5 mg by mouth every evening.    magnesium oxide (MAG-OX) 400 MG tablet Take 400 mg by mouth daily.   methocarbamol (ROBAXIN-750) 750 MG tablet Take 1 tablet (750 mg total) by mouth every 6 (six) hours as needed for muscle spasms.   montelukast (SINGULAIR) 10 MG tablet Take 1 tablet by mouth every evening.   Multiple Vitamin (MULTIVITAMINS PO) Take 1 tablet by mouth daily.   polyethylene glycol (MIRALAX / GLYCOLAX) packet Take 17 g by mouth daily.   valACYclovir (VALTREX) 500 MG tablet TAKE ONE TABLET BY MOUTH ONE TIME DAILY   ZINC-VITAMIN C PO Take 2 capsules by mouth daily with breakfast.   [DISCONTINUED] LINZESS 145 MCG CAPS capsule TAKE 1 CAPSULE BY MOUTH DAILY BEFORE BREAKFAST.    Allergies  Allergen Reactions   Chlordiazepoxide-Clidinium Other (See Comments)    "loopy"   Codeine Nausea And Vomiting    Dizziness   Penicillins Hives    SOCIAL HISTORY/FAMILY HISTORY   Reviewed in Epic:  Pertinent findings:  Social History   Tobacco Use   Smoking status: Former     Packs/day: 1.00    Years: 10.00    Pack years: 10.00    Types: Cigarettes    Quit date: 12/12/1988    Years since quitting: 33.1   Smokeless tobacco: Never  Vaping Use   Vaping Use: Never used  Substance Use Topics   Alcohol use: Yes    Comment: social use/wine rare   Drug use: No   Social History   Social History Narrative   They have 2 children and at least two grandchildren. She does all her activities of daily living. She works as a Midwife for [employer].     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 She does other days of the gym and she is able to at least 60 minutes a day 5 days a week.   She is a former smoker who quit in 1990. This was after smoking a pack a day for 10 years. She drinks social alcohol on occasion.    OBJCTIVE -PE, EKG, labs   Wt Readings from Last 3 Encounters:  12/21/21 179 lb 6.4 oz (81.4 kg)  12/16/21 179 lb (81.2 kg)  09/29/21 179 lb 2 oz (81.3 kg)    Physical Exam: BP 110/62    Pulse (!) 51    Ht 5\' 10"  (1.778 m)    Wt 179 lb 6.4 oz (81.4 kg)    SpO2 98%    BMI 25.74 kg/m  Physical Exam Vitals reviewed.  Constitutional:      General: She is not in acute distress.    Appearance: Normal appearance. She is normal weight. She is not ill-appearing or toxic-appearing.     Comments: Well-nourished, well-groomed.  Healthy-appearing.  HENT:     Head: Normocephalic and atraumatic.  Neck:     Vascular: No carotid bruit or JVD.  Cardiovascular:     Rate and Rhythm: Regular rhythm. Bradycardia present. No extrasystoles are present.    Chest Wall: PMI is not displaced.     Pulses: Normal pulses.     Heart sounds: S1 normal and S2 normal. No murmur heard.   No friction rub. No gallop.  Pulmonary:     Effort: No respiratory distress.     Breath sounds: Normal breath sounds. No wheezing, rhonchi or rales.  Chest:     Chest wall: No tenderness.   Musculoskeletal:        General: No swelling. Normal range of motion.     Cervical back: Normal range of motion and neck supple.  Skin:    General: Skin is warm and dry.  Neurological:     General: No focal deficit present.     Mental Status: She is alert and oriented to person, place, and time.  Psychiatric:        Mood and Affect: Mood normal.        Behavior: Behavior normal.        Thought Content: Thought content normal.        Judgment: Judgment normal.     Adult ECG Report  Rate: 51;  Rhythm: sinus bradycardia and mild LVH criteria. ; Normal axis, intervals and durations.  Narrative Interpretation: Stable  Recent Labs: Reviewed Lab Results  Component Value Date   CHOL 151 07/06/2021   HDL 48.90 07/06/2021   LDLCALC 86 07/06/2021   TRIG 77.0 07/06/2021   CHOLHDL 3 07/06/2021   Lab Results  Component Value Date   CREATININE 0.76 12/16/2021   BUN 15 12/16/2021   NA 141 12/16/2021   K 3.6 12/16/2021   CL 103 12/16/2021   CO2 31 12/16/2021   CBC Latest Ref Rng & Units 12/16/2021 07/12/2021 07/06/2021  WBC 4.0 - 10.5 K/uL 15.2(H) 14.4(H) 12.7(H)  Hemoglobin 12.0 - 15.0 g/dL 14.3 14.5 14.0  Hematocrit 36.0 - 46.0 % 43.5 44.1 42.7  Platelets 150 - 400 K/uL 301 306 322.0    Lab Results  Component Value Date   HGBA1C 5.5 02/01/2021   Lab Results  Component Value Date   TSH 2.50 03/05/2020    ==================================================  COVID-19 Education: The signs and symptoms of COVID-19 were discussed with the patient and how to seek care for testing (follow up with PCP or arrange E-visit).    I spent a total of 17 minutes with the patient spent in direct patient consultation.  Additional time spent with chart review  / charting (studies, outside notes, etc): 15 min Total Time: 32 min  Current medicines are reviewed at length with the patient today.  (+/- concerns) none  This visit occurred during the SARS-CoV-2 public health emergency.  Safety  protocols were in place, including screening questions prior to the visit, additional usage of staff PPE, and extensive cleaning of exam room while observing appropriate contact time as indicated for disinfecting solutions.  Notice: This dictation was prepared with Dragon dictation along with smart phrase technology. Any transcriptional errors that result from this process are unintentional and may not be corrected upon review.  Studies Ordered:   Orders Placed This Encounter  Procedures   EKG 12-Lead    Patient Instructions / Medication Changes & Studies & Tests Ordered   Patient Instructions  Medication Instructions:   No changes  *If you need a refill on your cardiac medications before your next appointment, please call your pharmacy*   Lab Work:  Not neede   Testing/Procedures: Not  needed   Follow-Up: At [facility], you and your health needs are our priority.  As part of our continuing mission to provide you with exceptional heart care, we have created designated Provider Care Teams.  These Care Teams include your primary Cardiologist (physician) and Advanced Practice Providers (APPs -  Physician Assistants and Nurse Practitioners) who all work together to provide you with the care you need, when you need it.     Your next appointment:   12 month(s)  The format for your next appointment:   In Person  Provider:   None    Other Instructions  [facility]!!!     Glenetta Hew, M.D., M.S. Interventional Cardiologist   Pager # [phone] Phone # [phone] [address] [place], [place] [address]   Thank you for choosing Heartcare at [facility]!!

## 2022-01-06 ENCOUNTER — Other Ambulatory Visit: Payer: Self-pay | Admitting: Internal Medicine

## 2022-01-12 ENCOUNTER — Encounter: Payer: Self-pay | Admitting: Hematology & Oncology

## 2022-01-24 ENCOUNTER — Encounter: Payer: Self-pay | Admitting: Cardiology

## 2022-01-24 NOTE — Assessment & Plan Note (Signed)
Completely asymptomatic - No chronotropic intolerance noted on monitor  Avoid AVN agents.

## 2022-01-24 NOTE — Assessment & Plan Note (Signed)
No longer hypotensive.  However would not be aggressive with treating blood pressures on her.

## 2022-01-24 NOTE — Assessment & Plan Note (Signed)
Well-healed.  No further shunting noted on echocardiogram most recently checked in November 2020.  Can probably wait till 2025 to reassess.

## 2022-01-24 NOTE — Assessment & Plan Note (Signed)
1 short asymptomatic episode noted on the monitor.  Normal echocardiogram and nonischemic Myoview.  Likely benign.

## 2022-01-24 NOTE — Assessment & Plan Note (Signed)
Now status post left SSV ablation.  Left GSV looked okay.  Edema better.  Continue foot elevation.

## 2022-01-24 NOTE — Assessment & Plan Note (Signed)
No further episodes of syncope.  Episode that she had was quite likely micturition syncope as complication of vasovagal syncope.  Think continue aggressive hydration and avoiding triggers.

## 2022-03-09 ENCOUNTER — Telehealth: Payer: Self-pay | Admitting: Family Medicine

## 2022-03-09 DIAGNOSIS — M8580 Other specified disorders of bone density and structure, unspecified site: Secondary | ICD-10-CM

## 2022-03-09 DIAGNOSIS — A609 Anogenital herpesviral infection, unspecified: Secondary | ICD-10-CM

## 2022-03-09 MED ORDER — VALACYCLOVIR HCL 500 MG PO TABS: 500.0000 mg | ORAL_TABLET | Freq: Every day | ORAL | 0 refills | Status: DC

## 2022-03-09 NOTE — Telephone Encounter (Signed)
Caller Name: Maryagnes Carrasco ?Call back phone #: [phone] ? ?MEDICATION(S): valACYclovir (VALTREX) 500 MG tablet ? ? ?Days of Med Remaining: 2 or 3 days ? ?Has the patient contacted their pharmacy (YES/NO)?  Yes, she was advised to call us ?IF YES, when and what did the pharmacy advise?  ?IF NO, request that the patient contact the pharmacy for the refills in the future.  ?           The pharmacy will send an electronic request (except for controlled medications). ? ?Preferred Pharmacy: [facility], [address] ? ?~~~Please advise patient/caregiver to allow 2-3 business days to process RX refills.  ?

## 2022-03-27 ENCOUNTER — Other Ambulatory Visit: Payer: Self-pay | Admitting: Family Medicine

## 2022-04-17 ENCOUNTER — Encounter: Payer: Self-pay | Admitting: Hematology & Oncology

## 2022-04-18 ENCOUNTER — Other Ambulatory Visit: Payer: Self-pay

## 2022-04-18 MED ORDER — LINACLOTIDE 145 MCG PO CAPS: ORAL_CAPSULE | ORAL | 1 refills | Status: DC

## 2022-04-22 ENCOUNTER — Encounter: Payer: Self-pay | Admitting: Family Medicine

## 2022-05-07 ENCOUNTER — Other Ambulatory Visit: Payer: Self-pay | Admitting: Family Medicine

## 2022-05-07 DIAGNOSIS — A609 Anogenital herpesviral infection, unspecified: Secondary | ICD-10-CM

## 2022-05-07 DIAGNOSIS — M8580 Other specified disorders of bone density and structure, unspecified site: Secondary | ICD-10-CM

## 2022-05-26 ENCOUNTER — Other Ambulatory Visit: Payer: Self-pay | Admitting: Oncology

## 2022-05-26 ENCOUNTER — Inpatient Hospital Stay: Payer: Managed Care, Other (non HMO) | Admitting: Hematology & Oncology

## 2022-05-26 ENCOUNTER — Encounter: Payer: Self-pay | Admitting: Hematology & Oncology

## 2022-05-26 ENCOUNTER — Inpatient Hospital Stay: Payer: Managed Care, Other (non HMO) | Attending: Hematology & Oncology

## 2022-05-26 VITALS — BP 110/64 | HR 67 | Temp 98.4°F | Resp 20 | Ht 70.0 in | Wt 176.0 lb

## 2022-05-26 DIAGNOSIS — C9110 Chronic lymphocytic leukemia of B-cell type not having achieved remission: Secondary | ICD-10-CM | POA: Insufficient documentation

## 2022-05-26 LAB — CMP (CANCER CENTER ONLY)
ALT: 26 U/L (ref 0–44)
AST: 24 U/L (ref 15–41)
Albumin: 3.8 g/dL (ref 3.5–5.0)
Alkaline Phosphatase: 56 U/L (ref 38–126)
Anion gap: 5 (ref 5–15)
BUN: 26 mg/dL — ABNORMAL HIGH (ref 8–23)
CO2: 27 mmol/L (ref 22–32)
Calcium: 8.9 mg/dL (ref 8.9–10.3)
Chloride: 107 mmol/L (ref 98–111)
Creatinine: 0.74 mg/dL (ref 0.44–1.00)
GFR, Estimated: >60 mL/min (ref >60)
Glucose, Bld: 96 mg/dL (ref 70–99)
Potassium: 3.7 mmol/L (ref 3.5–5.1)
Sodium: 139 mmol/L (ref 135–145)
Total Bilirubin: 0.6 mg/dL (ref 0.3–1.2)
Total Protein: 6.7 g/dL (ref 6.5–8.1)

## 2022-05-26 LAB — CBC WITH DIFFERENTIAL (CANCER CENTER ONLY)
Abs Immature Granulocytes: 0.01 10*3/uL (ref 0.00–0.07)
Basophils Absolute: 0.1 10*3/uL (ref 0.0–0.1)
Basophils Relative: 1 %
Eosinophils Absolute: 0.2 10*3/uL (ref 0.0–0.5)
Eosinophils Relative: 2 %
HCT: 42.7 % (ref 36.0–46.0)
Hemoglobin: 14 g/dL (ref 12.0–15.0)
Immature Granulocytes: 0 %
Lymphocytes Relative: 67 %
Lymphs Abs: 9.9 10*3/uL — ABNORMAL HIGH (ref 0.7–4.0)
MCH: 31.5 pg (ref 26.0–34.0)
MCHC: 32.8 g/dL (ref 30.0–36.0)
MCV: 96 fL (ref 80.0–100.0)
Monocytes Absolute: 0.8 10*3/uL (ref 0.1–1.0)
Monocytes Relative: 5 %
Neutro Abs: 3.6 10*3/uL (ref 1.7–7.7)
Neutrophils Relative %: 25 %
Platelet Count: 267 10*3/uL (ref 150–400)
RBC: 4.45 MIL/uL (ref 3.87–5.11)
RDW: 12.5 % (ref 11.5–15.5)
Smear Review: NORMAL
WBC Count: 14.7 10*3/uL — ABNORMAL HIGH (ref 4.0–10.5)
nRBC: 0 % (ref 0.0–0.2)

## 2022-05-26 LAB — SAVE SMEAR(SSMR), FOR PROVIDER SLIDE REVIEW

## 2022-05-26 LAB — LACTATE DEHYDROGENASE: LDH: 124 U/L (ref 98–192)

## 2022-05-26 NOTE — Progress Notes (Signed)
Hematology and Oncology Follow Up Visit  [name] [id number] 1960/12/23 61 y.o. 05/26/2022   Principle Diagnosis:  CLL-  Stage A  --  13q-  Current Therapy:   Observation     Interim History:  [name] is back for a follow-up.  We last saw her back in January.  Since then, she and her family went down to [place].  That a wonderful time down to [place].  They did quite a lot of activities while they are down there.  She is still working.  I  give her credit for this.  She is doing a good job with work.  She has had no problems with fatigue or weakness.  She has had no issues with bleeding.  Is been no change in bowel or bladder habits.  She has had no problems with rashes.  There has been no leg swelling.  She has had no swollen lymph nodes.  She is trying to work out 4 times a week.  Overall, I would say her performance status is probably ECOG 0.   Medications:  Current Outpatient Medications:    ASTAXANTHIN PO, Take 12 mg by mouth daily., Disp: , Rfl:    Azelastine HCl 137 MCG/SPRAY SOLN, Place 1 spray into both nostrils as needed., Disp: , Rfl:    b complex vitamins capsule, Take 1 capsule by mouth daily., Disp: , Rfl:    Calcium Carb-Cholecalciferol 600-800 MG-UNIT TABS, Take 1 tablet by mouth 2 (two) times daily., Disp: , Rfl:    cycloSPORINE (RESTASIS) 0.05 % ophthalmic emulsion, INSTILL 1 DROP INTO EACH EYE TWICE DAILY, Disp: 60 each, Rfl: 0   ELDERBERRY PO, Take 1,000 mg by mouth daily., Disp: , Rfl:    EPIPEN 2-PAK 0.3 MG/0.3ML SOAJ injection, , Disp: , Rfl:    Green Tea, Camellia sinensis, (GREEN TEA 600) 600 MG CAPS, Take by mouth daily., Disp: , Rfl:    levocetirizine (XYZAL) 5 MG tablet, Take 5 mg by mouth every evening. , Disp: , Rfl:    linaclotide (LINZESS) 145 MCG CAPS capsule, TAKE 1 CAPSULE BY MOUTH EVERY DAY BEFORE BREAKFAST, Disp: 90 capsule, Rfl: 1   Magnesium Citrate 100 MG TABS, Take by mouth daily., Disp: , Rfl:    MELATONIN FAST  DISSOLVE PO, Take 45 mg by mouth at bedtime. Takes (3) 15 mg dissolvable tabs., Disp: , Rfl:    Misc Natural Products (JOINT HEALTH PO), Take by mouth daily., Disp: , Rfl:    montelukast (SINGULAIR) 10 MG tablet, Take 1 tablet by mouth every evening., Disp: , Rfl:    Multiple Vitamin (MULTIVITAMINS PO), Take 1 tablet by mouth daily., Disp: , Rfl:    Omega-3 Fatty Acids (FISH OIL PO), Take 500 mg by mouth daily., Disp: , Rfl:    OVER THE COUNTER MEDICATION, daily. Nature Made Stress Relief, Disp: , Rfl:    polyethylene glycol (MIRALAX / GLYCOLAX) packet, Take 17 g by mouth daily., Disp: , Rfl:    PRESCRIPTION MEDICATION, 500 mg daily. Mushroom Complex, Disp: , Rfl:    QUERCETIN PO, Take 1,000 mg by mouth daily., Disp: , Rfl:    Tart Cherry 1200 MG CAPS, Take by mouth daily., Disp: , Rfl:    Turmeric (QC TUMERIC COMPLEX) 500 MG CAPS, Take by mouth daily., Disp: , Rfl:    valACYclovir (VALTREX) 500 MG tablet, Take 1 tablet by mouth once daily, Disp: 90 tablet, Rfl: 0   Vitamin D-Vitamin K (K2 PLUS D3 PO), daily.,  Disp: , Rfl:    Vitamin E 100 units TABS, daily., Disp: , Rfl:    ZINC-VITAMIN C PO, Take 2 capsules by mouth daily with breakfast., Disp: , Rfl:    acetaminophen (TYLENOL) 500 MG tablet, Take 1,000 mg by mouth every 6 (six) hours as needed for mild pain. (Patient not taking: Reported on 05/26/2022), Disp: , Rfl:    magnesium oxide (MAG-OX) 400 MG tablet, Take 400 mg by mouth daily., Disp: , Rfl:    methocarbamol (ROBAXIN-750) 750 MG tablet, Take 1 tablet (750 mg total) by mouth every 6 (six) hours as needed for muscle spasms. (Patient not taking: Reported on 05/26/2022), Disp: 60 tablet, Rfl: 0  Allergies:  Allergies  Allergen Reactions   Chlordiazepoxide-Clidinium Other (See Comments)    "loopy"   Codeine Nausea And Vomiting    Dizziness   Penicillins Hives    Past Medical History, Surgical history, Social history, and Family History were reviewed and updated.  Review of  Systems: Review of Systems  Constitutional: Negative.   HENT:  Negative.    Eyes: Negative.   Respiratory: Negative.    Cardiovascular: Negative.   Gastrointestinal: Negative.   Endocrine: Negative.   Genitourinary: Negative.    Musculoskeletal: Negative.   Skin: Negative.   Neurological: Negative.   Hematological: Negative.   Psychiatric/Behavioral: Negative.      Physical Exam:  height is '5\' 10"'$  (1.778 m) and weight is 176 lb (79.8 kg). Her oral temperature is 98.4 F (36.9 C). Her blood pressure is 110/64 and her pulse is 67. Her respiration is 20 and oxygen saturation is 100%.   Wt Readings from Last 3 Encounters:  05/26/22 176 lb (79.8 kg)  12/21/21 179 lb 6.4 oz (81.4 kg)  12/16/21 179 lb (81.2 kg)    Physical Exam Vitals reviewed.  HENT:     Head: Normocephalic and atraumatic.  Eyes:     Pupils: Pupils are equal, round, and reactive to light.  Cardiovascular:     Rate and Rhythm: Normal rate and regular rhythm.     Heart sounds: Normal heart sounds.  Pulmonary:     Effort: Pulmonary effort is normal.     Breath sounds: Normal breath sounds.  Abdominal:     General: Bowel sounds are normal.     Palpations: Abdomen is soft.  Musculoskeletal:        General: No tenderness or deformity. Normal range of motion.     Cervical back: Normal range of motion.  Lymphadenopathy:     Cervical: No cervical adenopathy.  Skin:    General: Skin is warm and dry.     Findings: No erythema or rash.  Neurological:     Mental Status: She is alert and oriented to person, place, and time.  Psychiatric:        Behavior: Behavior normal.        Thought Content: Thought content normal.        Judgment: Judgment normal.     Lab Results  Component Value Date   WBC 14.7 (H) 05/26/2022   HGB 14.0 05/26/2022   HCT 42.7 05/26/2022   MCV 96.0 05/26/2022   PLT 267 05/26/2022     Chemistry      Component Value Date/Time   NA 139 05/26/2022 1455   K 3.7 05/26/2022 1455   CL  107 05/26/2022 1455   CO2 27 05/26/2022 1455   BUN 26 (H) 05/26/2022 1455   CREATININE 0.74 05/26/2022 1455  Component Value Date/Time   CALCIUM 8.9 05/26/2022 1455   ALKPHOS 56 05/26/2022 1455   AST 24 05/26/2022 1455   ALT 26 05/26/2022 1455   BILITOT 0.6 05/26/2022 1455      Impression and Plan: Ms. Tout is a very charming 61 year old white female.  She has CLL.  I think this is a low risk CLL.  For right now, I would just continue to follow her along.  I really do not see a problem with her with CLL.  I doubt that she would ever need treatment for this at least for several years.  I told her that she can stay active and that she can eat well this will certainly help with the CLL.  I told her to make that she wears sunscreen.  She really needs to make sure that she does not run into problems with skin cancer.  I would like to see her back in 6 months.    Volanda Napoleon, MD 6/15/20234:01 PM

## 2022-06-02 ENCOUNTER — Ambulatory Visit: Payer: Managed Care, Other (non HMO) | Admitting: Family Medicine

## 2022-06-02 ENCOUNTER — Encounter: Payer: Self-pay | Admitting: Family Medicine

## 2022-06-02 ENCOUNTER — Encounter: Payer: Managed Care, Other (non HMO) | Admitting: Family Medicine

## 2022-06-02 DIAGNOSIS — S39012A Strain of muscle, fascia and tendon of lower back, initial encounter: Secondary | ICD-10-CM

## 2022-06-02 DIAGNOSIS — A609 Anogenital herpesviral infection, unspecified: Secondary | ICD-10-CM | POA: Diagnosis not present

## 2022-06-02 DIAGNOSIS — S29019A Strain of muscle and tendon of unspecified wall of thorax, initial encounter: Secondary | ICD-10-CM | POA: Diagnosis not present

## 2022-06-02 DIAGNOSIS — M8580 Other specified disorders of bone density and structure, unspecified site: Secondary | ICD-10-CM | POA: Insufficient documentation

## 2022-06-02 MED ORDER — VALACYCLOVIR HCL 500 MG PO TABS: 500.0000 mg | ORAL_TABLET | Freq: Every day | ORAL | 3 refills | Status: DC

## 2022-06-02 MED ORDER — METHOCARBAMOL 750 MG PO TABS: 750.0000 mg | ORAL_TABLET | Freq: Four times a day (QID) | ORAL | 0 refills | Status: AC | PRN

## 2022-06-02 NOTE — Progress Notes (Signed)
Established Patient Office Visit  Subjective   Patient ID: [name], female    DOB: 04/05/1961  Age: 61 y.o. MRN: [id number]  Chief Complaint  Patient presents with   Follow-up    Follow up, no concerns.     HPI follow-up of intermittent spasms in her lower and mid back.  Takes Robaxin on occasion as needed.  She is on Valtrex for herpes prophylaxis.  It has been quiescent feels well. Up until about 5 years ago when she started having outbreaks.  Believed to be associated with onset of CLL.  CLL is stable.  She is taking care of herself by exercising and eating healthy and getting plenty of sleep.  She goes to stretching classes several times a week.    Review of Systems  Constitutional:  Negative for chills, diaphoresis, malaise/fatigue and weight loss.  HENT: Negative.    Eyes: Negative.  Negative for blurred vision and double vision.  Cardiovascular:  Negative for chest pain.  Gastrointestinal:  Negative for abdominal pain.  Genitourinary: Negative.   Musculoskeletal:  Negative for falls and myalgias.  Neurological:  Negative for speech change, loss of consciousness and weakness.  Psychiatric/Behavioral: Negative.        Objective:     BP 110/66 (BP Location: Left Arm, Patient Position: Sitting, Cuff Size: Normal)   Pulse 62   Temp 97.8 F (36.6 C) (Temporal)   Ht '5\' 10"'$  (1.778 m)   Wt 177 lb 6.4 oz (80.5 kg)   SpO2 96%   BMI 25.45 kg/m  Wt Readings from Last 3 Encounters:  06/02/22 177 lb 6.4 oz (80.5 kg)  05/26/22 176 lb (79.8 kg)  12/21/21 179 lb 6.4 oz (81.4 kg)      Physical Exam Constitutional:      General: She is not in acute distress.    Appearance: Normal appearance. She is not ill-appearing, toxic-appearing or diaphoretic.  HENT:     Head: Normocephalic and atraumatic.     Right Ear: External ear normal.     Left Ear: External ear normal.     Mouth/Throat:     Mouth: Mucous membranes are moist.     Pharynx: Oropharynx is clear. No  oropharyngeal exudate or posterior oropharyngeal erythema.  Eyes:     General: No scleral icterus.       Right eye: No discharge.        Left eye: No discharge.     Extraocular Movements: Extraocular movements intact.     Conjunctiva/sclera: Conjunctivae normal.     Pupils: Pupils are equal, round, and reactive to light.  Cardiovascular:     Rate and Rhythm: Normal rate and regular rhythm.  Pulmonary:     Effort: Pulmonary effort is normal. No respiratory distress.     Breath sounds: Normal breath sounds.  Musculoskeletal:     Cervical back: No rigidity or tenderness.  Skin:    General: Skin is warm and dry.  Neurological:     Mental Status: She is alert and oriented to person, place, and time.  Psychiatric:        Mood and Affect: Mood normal.        Behavior: Behavior normal.      No results found for any visits on 06/02/22.    The 10-year ASCVD risk score (Arnett DK, et al., 2019) is: 2.7%    Assessment & Plan:   Problem List Items Addressed This Visit       Musculoskeletal and Integument  Strain of lumbar region   Relevant Medications   methocarbamol (ROBAXIN-750) 750 MG tablet   Thoracic myofascial strain   Relevant Medications   methocarbamol (ROBAXIN-750) 750 MG tablet     Other   HSV (herpes simplex virus) anogenital infection   Relevant Medications   valACYclovir (VALTREX) 500 MG tablet    Return in about 6 months (around 12/02/2022), or if symptoms worsen or fail to improve.    Libby Maw, MD

## 2022-06-10 ENCOUNTER — Inpatient Hospital Stay: Payer: Managed Care, Other (non HMO)

## 2022-06-10 ENCOUNTER — Ambulatory Visit: Payer: Managed Care, Other (non HMO) | Admitting: Hematology & Oncology

## 2022-06-22 ENCOUNTER — Encounter: Payer: Self-pay | Admitting: Family Medicine

## 2022-06-30 ENCOUNTER — Encounter: Payer: Self-pay | Admitting: Hematology & Oncology

## 2022-07-13 ENCOUNTER — Ambulatory Visit (INDEPENDENT_AMBULATORY_CARE_PROVIDER_SITE_OTHER): Payer: Managed Care, Other (non HMO) | Admitting: Family Medicine

## 2022-07-13 ENCOUNTER — Encounter: Payer: Self-pay | Admitting: Family Medicine

## 2022-07-13 VITALS — BP 110/68 | HR 52 | Temp 97.0°F | Ht 70.0 in | Wt 175.0 lb

## 2022-07-13 DIAGNOSIS — Z0000 Encounter for general adult medical examination without abnormal findings: Secondary | ICD-10-CM | POA: Diagnosis not present

## 2022-07-13 LAB — CBC WITH DIFFERENTIAL/PLATELET
Basophils Absolute: 0.1 10*3/uL (ref 0.0–0.1)
Basophils Relative: 0.6 % (ref 0.0–3.0)
Eosinophils Absolute: 0.2 10*3/uL (ref 0.0–0.7)
Eosinophils Relative: 1.2 % (ref 0.0–5.0)
HCT: 43.6 % (ref 36.0–46.0)
Hemoglobin: 14.6 g/dL (ref 12.0–15.0)
Lymphocytes Relative: 71.1 % — ABNORMAL HIGH (ref 12.0–46.0)
Lymphs Abs: 11.3 10*3/uL — ABNORMAL HIGH (ref 0.7–4.0)
MCHC: 33.4 g/dL (ref 30.0–36.0)
MCV: 93.7 fl (ref 78.0–100.0)
Monocytes Absolute: 0.6 10*3/uL (ref 0.1–1.0)
Monocytes Relative: 3.9 % (ref 3.0–12.0)
Neutro Abs: 3.7 10*3/uL (ref 1.4–7.7)
Neutrophils Relative %: 23.2 % — ABNORMAL LOW (ref 43.0–77.0)
Platelets: 300 10*3/uL (ref 150.0–400.0)
RBC: 4.65 Mil/uL (ref 3.87–5.11)
RDW: 12.8 % (ref 11.5–15.5)
WBC: 15.8 10*3/uL — ABNORMAL HIGH (ref 4.0–10.5)

## 2022-07-13 LAB — COMPREHENSIVE METABOLIC PANEL
ALT: 25 U/L (ref 0–35)
AST: 24 U/L (ref 0–37)
Albumin: 4.3 g/dL (ref 3.5–5.2)
Alkaline Phosphatase: 68 U/L (ref 39–117)
BUN: 19 mg/dL (ref 6–23)
CO2: 31 mEq/L (ref 19–32)
Calcium: 9.5 mg/dL (ref 8.4–10.5)
Chloride: 102 mEq/L (ref 96–112)
Creatinine, Ser: 0.83 mg/dL (ref 0.40–1.20)
GFR: 75.41 mL/min (ref >60.00)
Glucose, Bld: 99 mg/dL (ref 70–99)
Potassium: 3.8 mEq/L (ref 3.5–5.1)
Sodium: 140 mEq/L (ref 135–145)
Total Bilirubin: 1.1 mg/dL (ref 0.2–1.2)
Total Protein: 7.1 g/dL (ref 6.0–8.3)

## 2022-07-13 LAB — LIPID PANEL
Cholesterol: 157 mg/dL (ref 0–200)
HDL: 49 mg/dL (ref >39.00)
LDL Cholesterol: 91 mg/dL (ref 0–99)
NonHDL: 107.72
Total CHOL/HDL Ratio: 3
Triglycerides: 82 mg/dL (ref 0.0–149.0)
VLDL: 16.4 mg/dL (ref 0.0–40.0)

## 2022-07-13 LAB — URINALYSIS, ROUTINE W REFLEX MICROSCOPIC
Bilirubin Urine: NEGATIVE
Hgb urine dipstick: NEGATIVE
Ketones, ur: NEGATIVE
Leukocytes,Ua: NEGATIVE
Nitrite: NEGATIVE
Specific Gravity, Urine: 1.01 (ref 1.000–1.030)
Total Protein, Urine: NEGATIVE
Urine Glucose: NEGATIVE
Urobilinogen, UA: 0.2 (ref 0.0–1.0)
pH: 8 (ref 5.0–8.0)

## 2022-07-13 NOTE — Progress Notes (Signed)
Established Patient Office Visit  Subjective   Patient ID: [name], female    DOB: 1961/05/04  Age: 61 y.o. MRN: [id number]  Chief Complaint  Patient presents with   [place], no concerns. Patient fasting.     HPI for physical exam.  Wife is mostly going well for her.  Things are well at home.  She is exercising by going to the gym with her husband.  She has regular dental care.  She is sleeping well with with the addition of melatonin 10 mg nightly.  She is eating healthy.  Regular GYN care.  Continues to work full-time.  She is somewhat stressed.  Her mother is in the assisted living.  She regularly visits her.  There is conflict with her older sister over her mother's care.  There have been ongoing relational difficulties between her and her sister.  CLL has been stable.  Energy levels have been okay.    Review of Systems  Constitutional:  Negative for chills, diaphoresis, malaise/fatigue and weight loss.  HENT: Negative.    Eyes: Negative.  Negative for blurred vision and double vision.  Cardiovascular:  Negative for chest pain.  Gastrointestinal:  Negative for abdominal pain.  Genitourinary: Negative.   Musculoskeletal:  Negative for falls and myalgias.  Neurological:  Negative for speech change, loss of consciousness and weakness.  Psychiatric/Behavioral: Negative.        Objective:     BP 110/68 (BP Location: Right Arm, Patient Position: Sitting, Cuff Size: Large)   Pulse (!) 52   Temp (!) 97 F (36.1 C) (Temporal)   Ht '5\' 10"'$  (1.778 m)   Wt 175 lb (79.4 kg)   SpO2 99%   BMI 25.11 kg/m  Wt Readings from Last 3 Encounters:  07/13/22 175 lb (79.4 kg)  06/02/22 177 lb 6.4 oz (80.5 kg)  05/26/22 176 lb (79.8 kg)      Physical Exam Constitutional:      General: She is not in acute distress.    Appearance: Normal appearance. She is not ill-appearing, toxic-appearing or diaphoretic.  HENT:     Head: Normocephalic and atraumatic.     Right  Ear: External ear normal.     Left Ear: External ear normal.     Mouth/Throat:     Mouth: Mucous membranes are moist.     Pharynx: Oropharynx is clear. No oropharyngeal exudate or posterior oropharyngeal erythema.  Eyes:     General: No scleral icterus.       Right eye: No discharge.        Left eye: No discharge.     Extraocular Movements: Extraocular movements intact.     Conjunctiva/sclera: Conjunctivae normal.     Pupils: Pupils are equal, round, and reactive to light.  Neck:     Vascular: No carotid bruit.  Cardiovascular:     Rate and Rhythm: Normal rate and regular rhythm.  Pulmonary:     Effort: Pulmonary effort is normal. No respiratory distress.     Breath sounds: Normal breath sounds.  Abdominal:     General: Bowel sounds are normal.  Musculoskeletal:     Cervical back: No rigidity or tenderness.  Lymphadenopathy:     Cervical: No cervical adenopathy.  Skin:    General: Skin is warm and dry.  Neurological:     Mental Status: She is alert and oriented to person, place, and time.  Psychiatric:        Mood and Affect:  Mood normal.        Behavior: Behavior normal.      No results found for any visits on 07/13/22.    The 10-year ASCVD risk score (Arnett DK, et al., 2019) is: 2.7%    Assessment & Plan:   Problem List Items Addressed This Visit       Other   Healthcare maintenance - Primary   Relevant Orders   Comprehensive metabolic panel   Lipid panel   Urinalysis, Routine w reflex microscopic   CBC w/Diff    Return in about 1 year (around 07/14/2023), or if symptoms worsen or fail to improve.  Continue active and healthy lifestyle.  Get plenty of rest.  Information was given on health maintenance and disease prevention.  Libby Maw, MD

## 2022-07-25 NOTE — Telephone Encounter (Signed)
err

## 2022-07-27 IMAGING — DX DG THORACIC SPINE 3V
5 series · 5 of 5 positions shown · non-contrast
Comparison: 11/07/2019

CLINICAL DATA: Back pain

EXAM:
THORACIC SPINE - 3 VIEWS

[t-spine ap]
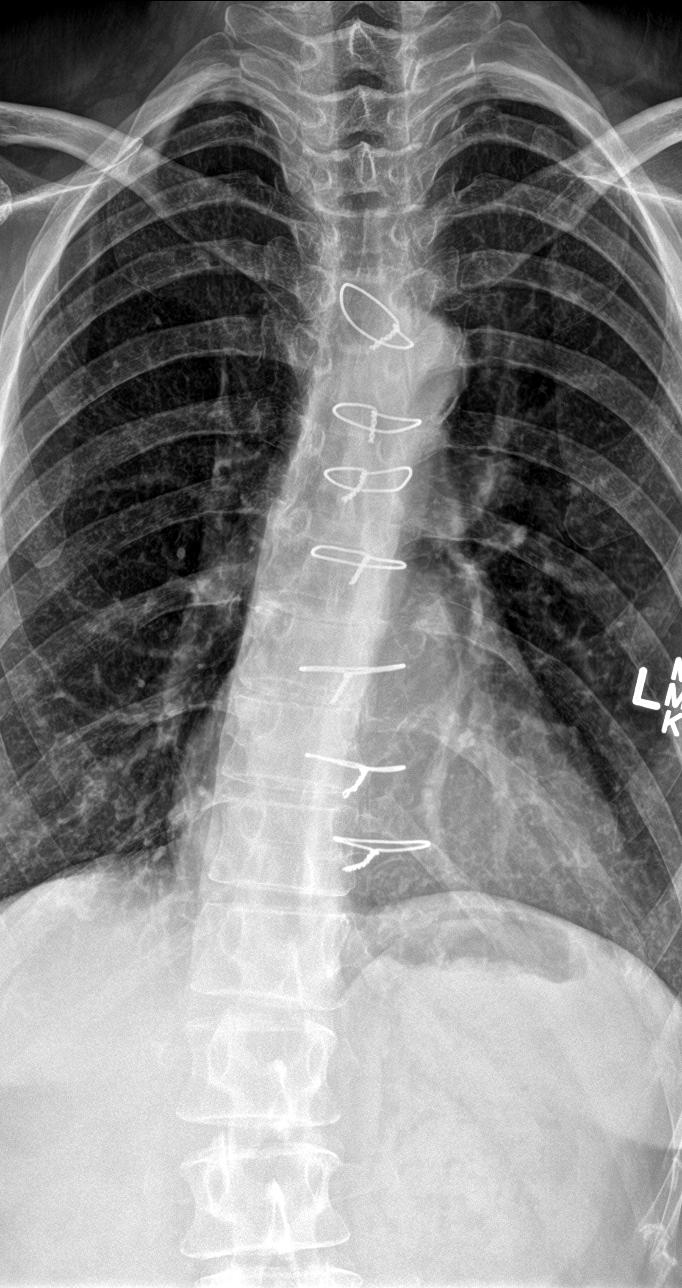

[t-spine lat (1 of 3)]
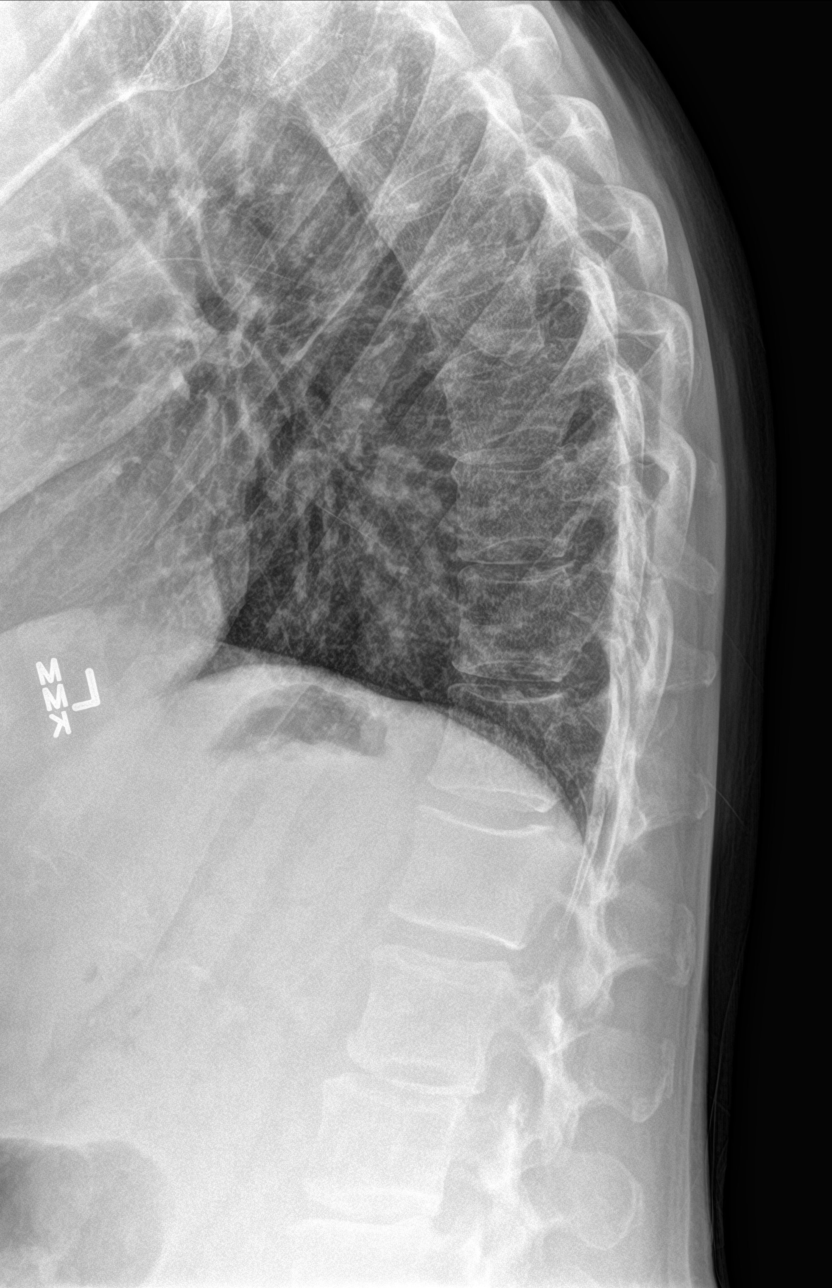

[swimmer]
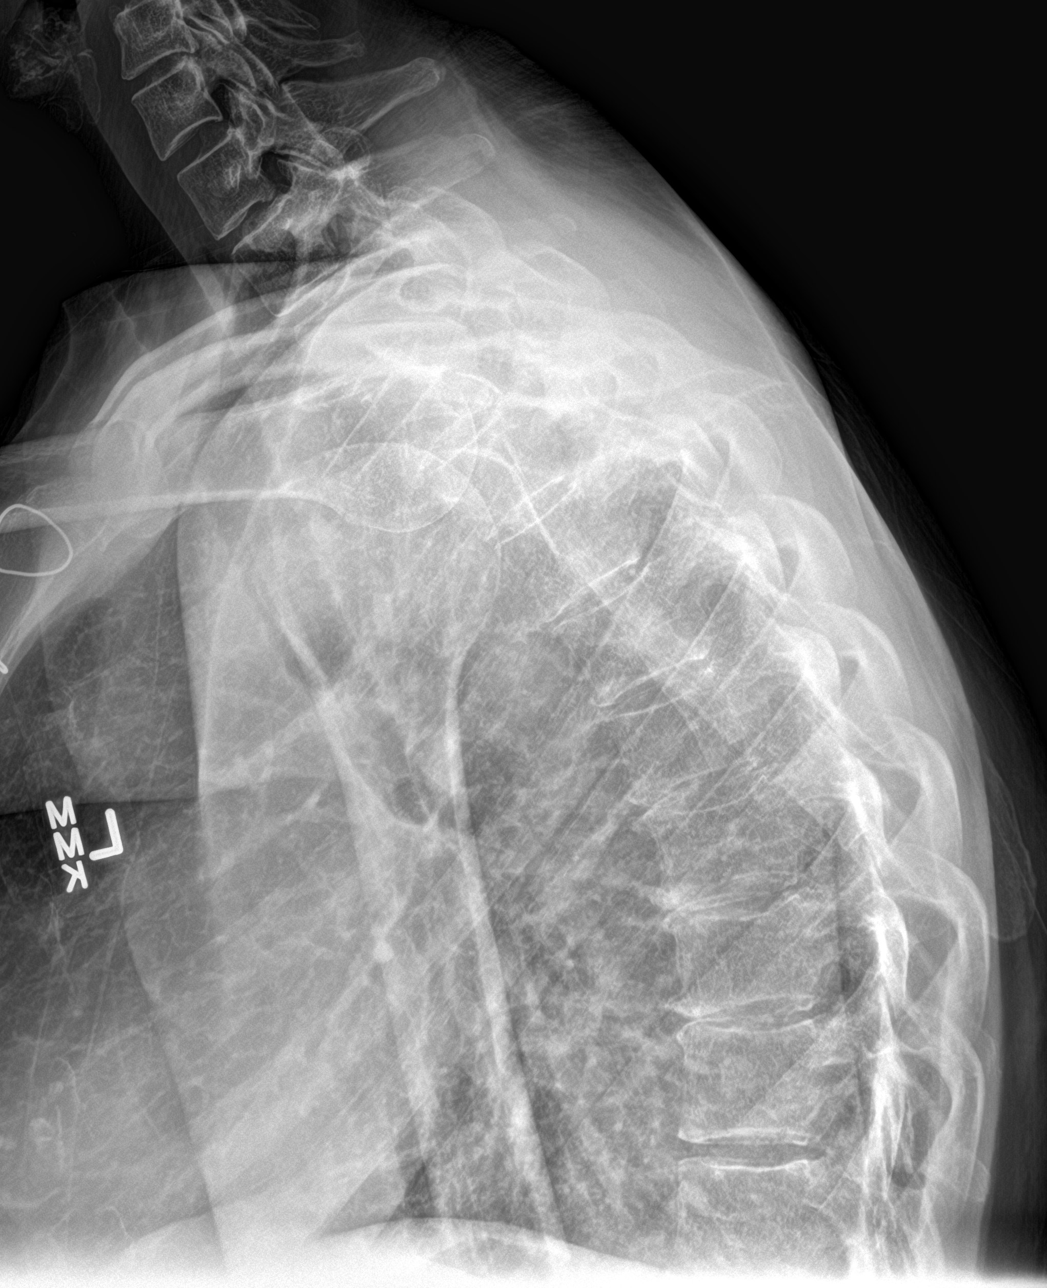

[t-spine lat (2 of 3)]
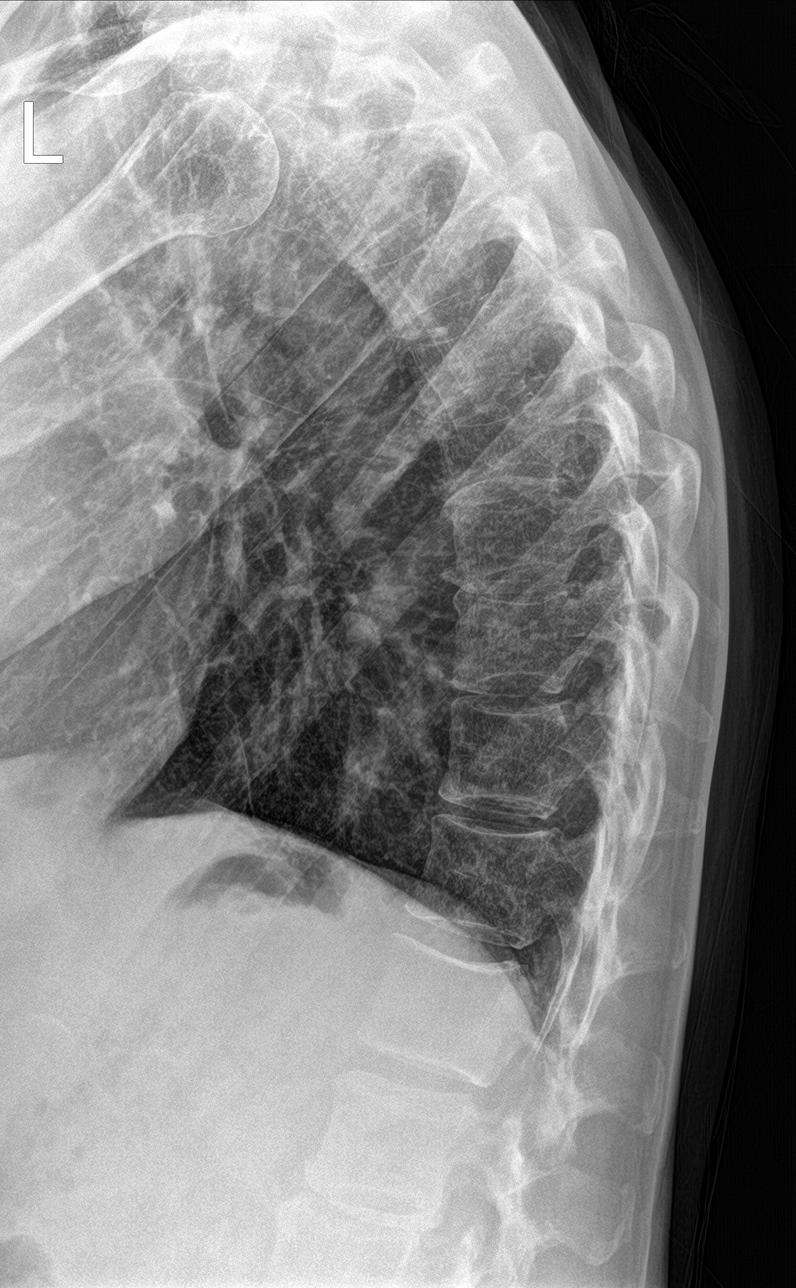

[t-spine lat (3 of 3)]
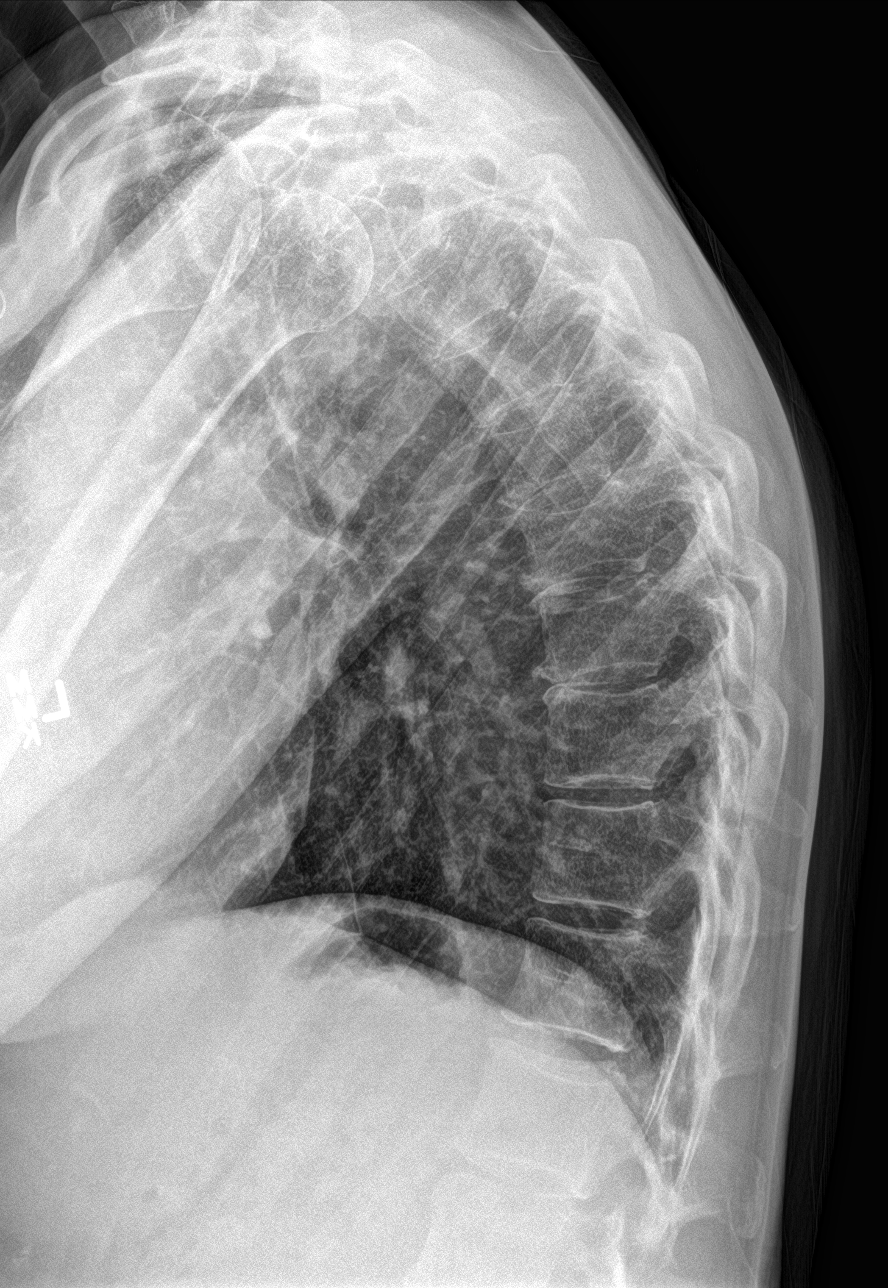

[5 of 5 positions shown; findings below may reference images not displayed]

FINDINGS: There is no evidence of thoracic spine fracture. Scoliotic
curvature, convex to the right within the lower thoracic spine.
Mildly exaggerated thoracic kyphosis. Thoracic intervertebral disc
heights are preserved. No other significant bone abnormalities are
identified.
IMPRESSION: No acute findings. Scoliosis and mildly exaggerated thoracic
kyphosis. No significant degenerative findings.

## 2022-08-22 ENCOUNTER — Encounter: Payer: Self-pay | Admitting: Hematology & Oncology

## 2022-08-31 ENCOUNTER — Encounter: Payer: Self-pay | Admitting: Family Medicine

## 2022-08-31 ENCOUNTER — Ambulatory Visit: Payer: Managed Care, Other (non HMO) | Admitting: Family Medicine

## 2022-08-31 VITALS — BP 106/68 | HR 67 | Temp 97.1°F | Ht 70.0 in | Wt 178.0 lb

## 2022-08-31 DIAGNOSIS — Z2911 Encounter for prophylactic immunotherapy for respiratory syncytial virus (RSV): Secondary | ICD-10-CM

## 2022-08-31 DIAGNOSIS — R7309 Other abnormal glucose: Secondary | ICD-10-CM

## 2022-08-31 DIAGNOSIS — Z23 Encounter for immunization: Secondary | ICD-10-CM | POA: Insufficient documentation

## 2022-08-31 DIAGNOSIS — R635 Abnormal weight gain: Secondary | ICD-10-CM | POA: Insufficient documentation

## 2022-08-31 LAB — HEMOGLOBIN A1C: Hgb A1c MFr Bld: 5.7 % (ref 4.6–6.5)

## 2022-08-31 MED ORDER — METFORMIN HCL 500 MG PO TABS: 500.0000 mg | ORAL_TABLET | Freq: Every day | ORAL | 2 refills | Status: DC

## 2022-08-31 MED ORDER — RSVPREF3 VAC RECOMB ADJUVANTED 120 MCG/0.5ML IM SUSR: 0.5000 mL | Freq: Once | INTRAMUSCULAR | 0 refills | Status: AC

## 2022-08-31 NOTE — Progress Notes (Signed)
Established Patient Office Visit  Subjective   Patient ID: [name], female    DOB: 15-May-1961  Age: 61 y.o. MRN: [id number]  Chief Complaint  Patient presents with   Weight Gain    Concerns about weight gain and stress x 4 months would like Rx for RSV vaccine.     HPI follow-up of stress with work and her mothers convalescence in a nursing home in [place].  She is gaining weight.  She has been going to the gym daily.  She has a history of prediabetes.  She is worried about developing diabetes.    Review of Systems  Constitutional: Negative.   HENT: Negative.    Eyes:  Negative for blurred vision, discharge and redness.  Respiratory: Negative.    Cardiovascular: Negative.   Gastrointestinal:  Negative for abdominal pain.  Genitourinary: Negative.   Musculoskeletal: Negative.  Negative for myalgias.  Skin:  Negative for rash.  Neurological:  Negative for tingling, loss of consciousness and weakness.  Endo/Heme/Allergies:  Negative for polydipsia.  Psychiatric/Behavioral:  Negative for depression and substance abuse. The patient is nervous/anxious.       Objective:     BP 106/68 (BP Location: Left Arm, Patient Position: Sitting, Cuff Size: Normal)   Pulse 67   Temp (!) 97.1 F (36.2 C) (Temporal)   Ht '5\' 10"'$  (1.778 m)   Wt 178 lb (80.7 kg)   SpO2 97%   BMI 25.54 kg/m  Wt Readings from Last 3 Encounters:  08/31/22 178 lb (80.7 kg)  07/13/22 175 lb (79.4 kg)  06/02/22 177 lb 6.4 oz (80.5 kg)      Physical Exam Constitutional:      General: She is not in acute distress.    Appearance: Normal appearance. She is not ill-appearing, toxic-appearing or diaphoretic.  HENT:     Head: Normocephalic and atraumatic.     Right Ear: External ear normal.     Left Ear: External ear normal.  Eyes:     General: No scleral icterus.       Right eye: No discharge.        Left eye: No discharge.     Extraocular Movements: Extraocular movements intact.      Conjunctiva/sclera: Conjunctivae normal.  Pulmonary:     Effort: Pulmonary effort is normal.  Skin:    General: Skin is warm and dry.  Neurological:     Mental Status: She is alert and oriented to person, place, and time.  Psychiatric:        Mood and Affect: Mood normal.        Behavior: Behavior normal.      No results found for any visits on 08/31/22.    The 10-year ASCVD risk score (Arnett DK, et al., 2019) is: 2.6%    Assessment & Plan:   Problem List Items Addressed This Visit       Other   Need for influenza vaccination - Primary   Relevant Orders   Flu Vaccine QUAD 6+ mos PF IM (Fluarix Quad PF) (Completed)   Need for RSV immunization   Relevant Medications   RSV vaccine recomb adjuvanted (AREXVY) 120 MCG/0.5ML injection   Other Relevant Orders   RSV,Recombinant PF (Arexvy)   Weight gain   Relevant Medications   metFORMIN (GLUCOPHAGE) 500 MG tablet   Other Relevant Orders   Hemoglobin A1c   Other Visit Diagnoses     Elevated glucose       Relevant Medications   metFORMIN (  GLUCOPHAGE) 500 MG tablet   Other Relevant Orders   Hemoglobin A1c       Return in about 3 months (around 11/30/2022).   We will start metformin every morning.  Warned about GI side effects.  Report any increasing fatigue or lightheadedness while taking this medicine.  Would be ideal to  obtain a glucometer and check blood sugars periodically. Libby Maw, MD

## 2022-09-26 ENCOUNTER — Encounter: Payer: Self-pay | Admitting: Family Medicine

## 2022-10-19 ENCOUNTER — Telehealth: Payer: Self-pay

## 2022-10-19 DIAGNOSIS — I8390 Asymptomatic varicose veins of unspecified lower extremity: Secondary | ICD-10-CM

## 2022-10-19 NOTE — Telephone Encounter (Signed)
Pt called stating that her L leg was having intermittent pain. She had an ablation approx 1.5 years ago, but the pain never completely went away. She states that massages hurt her leg as well.  Reviewed pt's chart, returned call for clarification, two identifiers used. Appts for [place] and [place] scheduled on [place] day. Confirmed understanding.

## 2022-11-15 ENCOUNTER — Encounter: Payer: Self-pay | Admitting: Hematology & Oncology

## 2022-11-16 ENCOUNTER — Other Ambulatory Visit: Payer: Self-pay | Admitting: Family Medicine

## 2022-11-16 ENCOUNTER — Ambulatory Visit (HOSPITAL_COMMUNITY)
Admission: RE | Admit: 2022-11-16 | Discharge: 2022-11-16 | Disposition: A | Discharge status: Home / Self Care | Payer: Managed Care, Other (non HMO) | Source: Ambulatory Visit | Attending: Vascular Surgery | Admitting: Vascular Surgery

## 2022-11-16 ENCOUNTER — Ambulatory Visit: Payer: Managed Care, Other (non HMO) | Admitting: Physician Assistant

## 2022-11-16 VITALS — BP 97/57 | HR 57 | Temp 97.7°F | Ht 70.0 in | Wt 163.0 lb

## 2022-11-16 DIAGNOSIS — I8390 Asymptomatic varicose veins of unspecified lower extremity: Secondary | ICD-10-CM

## 2022-11-16 DIAGNOSIS — I8393 Asymptomatic varicose veins of bilateral lower extremities: Secondary | ICD-10-CM

## 2022-11-16 DIAGNOSIS — I8392 Asymptomatic varicose veins of left lower extremity: Secondary | ICD-10-CM

## 2022-11-16 DIAGNOSIS — R635 Abnormal weight gain: Secondary | ICD-10-CM

## 2022-11-16 DIAGNOSIS — R7309 Other abnormal glucose: Secondary | ICD-10-CM

## 2022-11-16 NOTE — Progress Notes (Signed)
Office Note     CC:  follow up Requesting Provider:  Libby Maw,*  HPI: [name] is a 61 y.o. ([birth year]) female who presents for evaluation of left leg pain.  She has been experiencing pain over the past 6 months and feels that is related to her varicosities.  She underwent small saphenous vein ablation and stab phlebectomy by Dr. Scot Dock on 09/03/2020.  She is concerned it is related to her surgery.  She admittedly is not wearing compression or elevating her legs during the day.  She has not had any venous ulcerations and does not complain of any significant edema.  She denies claudication or tissue loss.   Past Medical History:  Diagnosis Date   Allergic rhinitis    Allergy    dust   Anxiety    Back pain    CLL (chronic lymphocytic leukemia) (HCC) 12/16/2021   Contact lens/glasses fitting    Depression    Endometriosis    Fatigue    GERD (gastroesophageal reflux disease)    Goals of care, counseling/discussion 12/16/2021   Hiatal hernia    History of Ostium Secundum Atrial Septal Defect 1989   Status post repair in 1989/1990   Hx of migraines    IBS (irritable bowel syndrome)    Incontinence    Intestinal volvulus ([place]) 04/2006   Loss of appetite    Monoclonal B-cell lymphocytosis    Onychomycosis    Osteopenia of femoral neck 02/24/2020   Sleep apnea    uses mouthguard   Syncope 11/07/2019   Varicose veins of leg with edema, bilateral    RLE varicose vein ablation in 2008; LLE left SSV ablation September 2021    Past Surgical History:  Procedure Laterality Date   ABDOMINAL HYSTERECTOMY  2004   ASD REPAIR, SECUNDUM  1990   Dr. Jackie Plum SINUPLASTY Left    COLONOSCOPY     ENDOVENOUS ABLATION SAPHENOUS VEIN W/ LASER Left 09/03/2020   EVLA  LSSV  and stab phlebectomy  10-20   EYE SURGERY     muscle reattachment 02/2017   hysterectomy for severe endometriosis     NASAL SEPTUM SURGERY     Dr. Georgia Lopes   OPEN REDUCTION INTERNAL  FIXATION (ORIF) DISTAL RADIAL FRACTURE Right 06/20/2017   Procedure: OPEN REDUCTION INTERNAL FIXATION (ORIF) DISTAL RADIAL FRACTURE;  Surgeon: Leanora Cover, MD;  Location: [facility];  Service: Orthopedics;  Laterality: Right;   right ankle surgery  2010   right leg fracture with surgery     done with ankle surgery   TRANSTHORACIC ECHOCARDIOGRAM   January 2012   Normal LV size and function, EF greater than 55%. Mild LA dilation. No intra-atrial shunt with bubble study. Normal pulmonary pressures. Only trace to mild mitral and tricuspid regurgitation.   TRANSTHORACIC ECHOCARDIOGRAM  11/07/2019   EF 60 to 55%.  LVH.  GR 1 DD, with moderate LA dilation.  Relatively normal valves.  RA pressure estimated 8 mmHg.   UPPER GASTROINTESTINAL ENDOSCOPY     varicose laser vein surgery  2009    Social History   Socioeconomic History   Marital status: Married    Spouse name: Richard   Number of children: Not on file   Years of education: Not on file   Highest education level: Not on file  Occupational History   Occupation: remodel houses    Employer: [employer]  Tobacco Use   Smoking status: Former    Packs/day:  1.00    Years: 10.00    Total pack years: 10.00    Types: Cigarettes    Quit date: 12/12/1988    Years since quitting: 33.9   Smokeless tobacco: Never  Vaping Use   Vaping Use: Never used  Substance and Sexual Activity   Alcohol use: Yes    Alcohol/week: 1.0 standard drink of alcohol    Types: 1 Glasses of wine per week    Comment: social use/wine rare   Drug use: No   Sexual activity: Yes  Other Topics Concern   Not on file  Social History Narrative   They have 2 children and at least two grandchildren. She does all her activities of daily living. She works as a Midwife for [employer].     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 She does other  days of the gym and she is able to at least 60 minutes a day 5 days a week.   She is a former smoker who quit in 1990. This was after smoking a pack a day for 10 years. She drinks social alcohol on occasion.   Social Determinants of Health   Financial Resource Strain: Not on file  Food Insecurity: Not on file  Transportation Needs: Not on file  Physical Activity: Not on file  Stress: Not on file  Social Connections: Not on file  Intimate Partner Violence: Not on file    Family History  Problem Relation Age of Onset   Diabetes Mother    Clotting disorder Sister    Heart disease Sister    Lung cancer Maternal Grandmother        with mets to the brain   Clotting disorder Maternal Grandmother    Heart disease Maternal Grandfather    Prostate cancer Maternal Grandfather    Colon cancer Paternal Grandfather    Colon polyps Neg Hx    Esophageal cancer Neg Hx    Rectal cancer Neg Hx    Stomach cancer Neg Hx     Current Outpatient Medications  Medication Sig Dispense Refill   acetaminophen (TYLENOL) 500 MG tablet Take 1,000 mg by mouth every 6 (six) hours as needed for mild pain.     ASTAXANTHIN PO Take 12 mg by mouth daily.     Azelastine HCl 137 MCG/SPRAY SOLN Place 1 spray into both nostrils as needed.     b complex vitamins capsule Take 1 capsule by mouth daily.     Calcium Carb-Cholecalciferol 600-800 MG-UNIT TABS Take 1 tablet by mouth 2 (two) times daily.     cycloSPORINE (RESTASIS) 0.05 % ophthalmic emulsion INSTILL 1 DROP INTO EACH EYE TWICE DAILY 60 each 0   ELDERBERRY PO Take 1,000 mg by mouth daily.     EPIPEN 2-PAK 0.3 MG/0.3ML SOAJ injection      Green Tea, Camellia sinensis, (GREEN TEA 600) 600 MG CAPS Take by mouth daily.     levocetirizine (XYZAL) 5 MG tablet Take 5 mg by mouth every evening.      linaclotide (LINZESS) 145 MCG CAPS capsule TAKE 1 CAPSULE BY MOUTH EVERY DAY BEFORE BREAKFAST 90 capsule 1   Magnesium Citrate 100 MG TABS Take by mouth daily.      MELATONIN FAST DISSOLVE PO Take 45 mg by mouth at bedtime. Takes (3) 15 mg dissolvable tabs.     metFORMIN (GLUCOPHAGE) 500 MG tablet Take  1 tablet by mouth once daily with breakfast 90 tablet 3   methocarbamol (ROBAXIN-750) 750 MG tablet Take 1 tablet (750 mg total) by mouth every 6 (six) hours as needed for muscle spasms. 60 tablet 0   Misc Natural Products (JOINT HEALTH PO) Take by mouth daily.     montelukast (SINGULAIR) 10 MG tablet Take 1 tablet by mouth every evening.     Multiple Vitamin (MULTIVITAMINS PO) Take 1 tablet by mouth daily.     Omega-3 Fatty Acids (FISH OIL PO) Take 500 mg by mouth daily.     OVER THE COUNTER MEDICATION daily. Nature Made Stress Relief     polyethylene glycol (MIRALAX / GLYCOLAX) packet Take 17 g by mouth daily.     PRESCRIPTION MEDICATION 500 mg daily. Mushroom Complex     QUERCETIN PO Take 1,000 mg by mouth daily.     Turmeric (QC TUMERIC COMPLEX) 500 MG CAPS Take by mouth daily.     valACYclovir (VALTREX) 500 MG tablet Take 1 tablet (500 mg total) by mouth daily. 90 tablet 3   Vitamin D-Vitamin K (K2 PLUS D3 PO) daily.     ZINC-VITAMIN C PO Take 2 capsules by mouth daily with breakfast.     No current facility-administered medications for this visit.    Allergies  Allergen Reactions   Chlordiazepoxide-Clidinium Other (See Comments)    "loopy"   Codeine Nausea And Vomiting    Dizziness   Penicillins Hives     REVIEW OF SYSTEMS:   '[X]'$  denotes positive finding, '[ ]'$  denotes negative finding Cardiac  Comments:  Chest pain or chest pressure:    Shortness of breath upon exertion:    Short of breath when lying flat:    Irregular heart rhythm:        Vascular    Pain in calf, thigh, or hip brought on by ambulation:    Pain in feet at night that wakes you up from your sleep:     Blood clot in your veins:    Leg swelling:         Pulmonary    Oxygen at home:    Productive cough:     Wheezing:         Neurologic    Sudden weakness in arms  or legs:     Sudden numbness in arms or legs:     Sudden onset of difficulty speaking or slurred speech:    Temporary loss of vision in one eye:     Problems with dizziness:         Gastrointestinal    Blood in stool:     Vomited blood:         Genitourinary    Burning when urinating:     Blood in urine:        Psychiatric    Major depression:         Hematologic    Bleeding problems:    Problems with blood clotting too easily:        Skin    Rashes or ulcers:        Constitutional    Fever or chills:      PHYSICAL EXAMINATION:  Vitals:   11/16/22 1110  BP: (!) 97/57  Pulse: (!) 57  Temp: 97.7 F (36.5 C)  TempSrc: Temporal  SpO2: 98%  Weight: 163 lb (73.9 kg)  Height: '5\' 10"'$  (1.778 m)    General:  WDWN in NAD; vital signs documented above Gait: Not  observed HENT: WNL, normocephalic Pulmonary: normal non-labored breathing , without Rales, rhonchi,  wheezing Cardiac: regular HR Abdomen: soft, NT, no masses Skin: without rashes Vascular Exam/Pulses: Palpable left DP and PT pulse Extremities: Tenderness over varicosity of the left calf; no stasis pigmentation or venous ulcerations Musculoskeletal: no muscle wasting or atrophy  Neurologic: A&O X 3;  No focal weakness or paresthesias are detected Psychiatric:  The pt has Normal affect.   Non-Invasive Vascular Imaging:   Left lower extremity venous reflux study negative for DVT.  Incompetent left common femoral vein.  Left greater saphenous vein is competent throughout its course.  Small saphenous vein was not visualized consistent with prior ablation    ASSESSMENT/PLAN:: 61 y.o. female here for follow up for left leg pain  -Based on physical exam, tenderness seems to be in an area of a collection of varicosities in her left calf.  Duplex is negative for DVT.  Duplex also consistent with prior small saphenous vein ablation.  Her greater saphenous vein is competent throughout its course.  Patient is relieved  after discussing the above findings with her.  I have recommended knee-high 15 to 20 mmHg compression to be worn during the day.  She should also make an effort to elevate her legs above the level of her heart periodically throughout the day.  She should avoid prolonged sitting and standing.  She should also use NSAIDs when tenderness becomes unbearable.  Nothing further to add from vascular surgery standpoint.  Her left foot is well-perfused with palpable pedal pulses.  She can follow-up on an as-needed basis.   Dagoberto Ligas, PA-C [facility] [phone]  Clinic MD:   Scot Dock

## 2022-11-18 ENCOUNTER — Telehealth: Payer: Self-pay | Admitting: *Deleted

## 2022-11-18 NOTE — Telephone Encounter (Signed)
Called and lvm of rescheduling appointment - requested callback to confirm 

## 2022-11-25 ENCOUNTER — Encounter: Payer: Self-pay | Admitting: Hematology & Oncology

## 2022-11-25 ENCOUNTER — Inpatient Hospital Stay: Payer: Managed Care, Other (non HMO) | Admitting: Hematology & Oncology

## 2022-11-25 ENCOUNTER — Inpatient Hospital Stay: Payer: Managed Care, Other (non HMO) | Attending: Hematology & Oncology

## 2022-11-25 VITALS — BP 105/55 | HR 61 | Temp 98.1°F | Resp 20 | Ht 70.0 in | Wt 162.0 lb

## 2022-11-25 DIAGNOSIS — C9110 Chronic lymphocytic leukemia of B-cell type not having achieved remission: Secondary | ICD-10-CM

## 2022-11-25 LAB — CBC WITH DIFFERENTIAL (CANCER CENTER ONLY)
Abs Immature Granulocytes: 0.04 10*3/uL (ref 0.00–0.07)
Basophils Absolute: 0.1 10*3/uL (ref 0.0–0.1)
Basophils Relative: 0 %
Eosinophils Absolute: 0.4 10*3/uL (ref 0.0–0.5)
Eosinophils Relative: 2 %
HCT: 43.4 % (ref 36.0–46.0)
Hemoglobin: 14.2 g/dL (ref 12.0–15.0)
Immature Granulocytes: 0 %
Lymphocytes Relative: 72 %
Lymphs Abs: 14 10*3/uL — ABNORMAL HIGH (ref 0.7–4.0)
MCH: 31.1 pg (ref 26.0–34.0)
MCHC: 32.7 g/dL (ref 30.0–36.0)
MCV: 95 fL (ref 80.0–100.0)
Monocytes Absolute: 0.8 10*3/uL (ref 0.1–1.0)
Monocytes Relative: 4 %
Neutro Abs: 4.3 10*3/uL (ref 1.7–7.7)
Neutrophils Relative %: 22 %
Platelet Count: 295 10*3/uL (ref 150–400)
RBC: 4.57 MIL/uL (ref 3.87–5.11)
RDW: 13.1 % (ref 11.5–15.5)
Smear Review: NORMAL
WBC Count: 19.5 10*3/uL — ABNORMAL HIGH (ref 4.0–10.5)
nRBC: 0 % (ref 0.0–0.2)

## 2022-11-25 LAB — CMP (CANCER CENTER ONLY)
ALT: 29 U/L (ref 0–44)
AST: 28 U/L (ref 15–41)
Albumin: 4.6 g/dL (ref 3.5–5.0)
Alkaline Phosphatase: 52 U/L (ref 38–126)
Anion gap: 7 (ref 5–15)
BUN: 18 mg/dL (ref 8–23)
CO2: 33 mmol/L — ABNORMAL HIGH (ref 22–32)
Calcium: 9.9 mg/dL (ref 8.9–10.3)
Chloride: 101 mmol/L (ref 98–111)
Creatinine: 0.69 mg/dL (ref 0.44–1.00)
GFR, Estimated: >60 mL/min (ref >60)
Glucose, Bld: 111 mg/dL — ABNORMAL HIGH (ref 70–99)
Potassium: 3.6 mmol/L (ref 3.5–5.1)
Sodium: 141 mmol/L (ref 135–145)
Total Bilirubin: 0.7 mg/dL (ref 0.3–1.2)
Total Protein: 7 g/dL (ref 6.5–8.1)

## 2022-11-25 LAB — SAVE SMEAR(SSMR), FOR PROVIDER SLIDE REVIEW

## 2022-11-25 NOTE — Progress Notes (Signed)
Hematology and Oncology Follow Up Visit  TERRENCE PIZANA [id number] November 27, 1961 61 y.o. 11/25/2022   Principle Diagnosis:  CLL-  Stage A  --  13q-/ [place] mutated  Current Therapy:   Observation     Interim History:  Ms. Eakes is back for a follow-up.  We see her every 6 months.  She is doing quite well.  She is still working.  She comes in with her husband.  So far, she has had no problems at all.  She has had no problems with swollen lymph nodes.  There is been no infections.  She has had no nausea or vomiting.  She has had no change in bowel or bladder habits.  There is been no bleeding.  She has had no issues with COVID.  There is no cough or shortness of breath.  She is exercising all the time.  I am incredibly impressed by her energy level.  Currently, I would have to say that her performance status is probably ECOG 0.     Medications:  Current Outpatient Medications:    acetaminophen (TYLENOL) 500 MG tablet, Take 1,000 mg by mouth every 6 (six) hours as needed for mild pain., Disp: , Rfl:    ASTAXANTHIN PO, Take 12 mg by mouth daily., Disp: , Rfl:    Azelastine HCl 137 MCG/SPRAY SOLN, Place 1 spray into both nostrils as needed., Disp: , Rfl:    Calcium Carb-Cholecalciferol 600-800 MG-UNIT TABS, Take 1 tablet by mouth 2 (two) times daily., Disp: , Rfl:    cycloSPORINE (RESTASIS) 0.05 % ophthalmic emulsion, INSTILL 1 DROP INTO EACH EYE TWICE DAILY, Disp: 60 each, Rfl: 0   ELDERBERRY PO, Take 1,000 mg by mouth daily., Disp: , Rfl:    Green Tea, Camellia sinensis, (GREEN TEA 600) 600 MG CAPS, Take by mouth daily., Disp: , Rfl:    levocetirizine (XYZAL) 5 MG tablet, Take 5 mg by mouth every evening. , Disp: , Rfl:    linaclotide (LINZESS) 145 MCG CAPS capsule, TAKE 1 CAPSULE BY MOUTH EVERY DAY BEFORE BREAKFAST, Disp: 90 capsule, Rfl: 1   Magnesium Citrate 100 MG TABS, Take by mouth daily., Disp: , Rfl:    MELATONIN FAST DISSOLVE PO, Take by mouth at bedtime. 11/25/2022 Takes  (2) 15 mg dissolvable tabs., Disp: , Rfl:    metFORMIN (GLUCOPHAGE) 500 MG tablet, Take 1 tablet by mouth once daily with breakfast, Disp: 90 tablet, Rfl: 3   Misc Natural Products (JOINT HEALTH PO), Take by mouth daily., Disp: , Rfl:    montelukast (SINGULAIR) 10 MG tablet, Take 1 tablet by mouth every evening., Disp: , Rfl:    Multiple Vitamin (MULTIVITAMINS PO), Take 1 tablet by mouth daily., Disp: , Rfl:    Omega-3 Fatty Acids (FISH OIL PO), Take 500 mg by mouth daily., Disp: , Rfl:    OVER THE COUNTER MEDICATION, daily. Nature Made Stress Relief, Disp: , Rfl:    polyethylene glycol (MIRALAX / GLYCOLAX) packet, Take 17 g by mouth daily., Disp: , Rfl:    PRESCRIPTION MEDICATION, 500 mg daily. Mushroom Complex, Disp: , Rfl:    QUERCETIN PO, Take 1,000 mg by mouth daily., Disp: , Rfl:    Turmeric (QC TUMERIC COMPLEX) 500 MG CAPS, Take by mouth daily., Disp: , Rfl:    valACYclovir (VALTREX) 500 MG tablet, Take 1 tablet (500 mg total) by mouth daily., Disp: 90 tablet, Rfl: 3   ZINC-VITAMIN C PO, Take 2 capsules by mouth daily with breakfast., Disp: , Rfl:  b complex vitamins capsule, Take 1 capsule by mouth daily. (Patient not taking: Reported on 11/25/2022), Disp: , Rfl:    EPIPEN 2-PAK 0.3 MG/0.3ML SOAJ injection, , Disp: , Rfl:    methocarbamol (ROBAXIN-750) 750 MG tablet, Take 1 tablet (750 mg total) by mouth every 6 (six) hours as needed for muscle spasms. (Patient not taking: Reported on 11/25/2022), Disp: 60 tablet, Rfl: 0  Allergies:  Allergies  Allergen Reactions   Chlordiazepoxide-Clidinium Other (See Comments)    "loopy"   Codeine Nausea And Vomiting    Dizziness   Penicillins Hives    Past Medical History, Surgical history, Social history, and Family History were reviewed and updated.  Review of Systems: Review of Systems  Constitutional: Negative.   HENT:  Negative.    Eyes: Negative.   Respiratory: Negative.    Cardiovascular: Negative.   Gastrointestinal: Negative.    Endocrine: Negative.   Genitourinary: Negative.    Musculoskeletal: Negative.   Skin: Negative.   Neurological: Negative.   Hematological: Negative.   Psychiatric/Behavioral: Negative.      Physical Exam:  height is '5\' 10"'$  (1.778 m) and weight is 162 lb (73.5 kg). Her oral temperature is 98.1 F (36.7 C). Her blood pressure is 105/55 (abnormal) and her pulse is 61. Her respiration is 20 and oxygen saturation is 99%.   Wt Readings from Last 3 Encounters:  11/25/22 162 lb (73.5 kg)  11/16/22 163 lb (73.9 kg)  08/31/22 178 lb (80.7 kg)    Physical Exam Vitals reviewed.  HENT:     Head: Normocephalic and atraumatic.  Eyes:     Pupils: Pupils are equal, round, and reactive to light.  Cardiovascular:     Rate and Rhythm: Normal rate and regular rhythm.     Heart sounds: Normal heart sounds.  Pulmonary:     Effort: Pulmonary effort is normal.     Breath sounds: Normal breath sounds.  Abdominal:     General: Bowel sounds are normal.     Palpations: Abdomen is soft.  Musculoskeletal:        General: No tenderness or deformity. Normal range of motion.     Cervical back: Normal range of motion.  Lymphadenopathy:     Cervical: No cervical adenopathy.  Skin:    General: Skin is warm and dry.     Findings: No erythema or rash.  Neurological:     Mental Status: She is alert and oriented to person, place, and time.  Psychiatric:        Behavior: Behavior normal.        Thought Content: Thought content normal.        Judgment: Judgment normal.     Lab Results  Component Value Date   WBC 19.5 (H) 11/25/2022   HGB 14.2 11/25/2022   HCT 43.4 11/25/2022   MCV 95.0 11/25/2022   PLT 295 11/25/2022     Chemistry      Component Value Date/Time   NA 140 07/13/2022 0837   K 3.8 07/13/2022 0837   CL 102 07/13/2022 0837   CO2 31 07/13/2022 0837   BUN 19 07/13/2022 0837   CREATININE 0.83 07/13/2022 0837   CREATININE 0.74 05/26/2022 1455      Component Value Date/Time    CALCIUM 9.5 07/13/2022 0837   ALKPHOS 68 07/13/2022 0837   AST 24 07/13/2022 0837   AST 24 05/26/2022 1455   ALT 25 07/13/2022 0837   ALT 26 05/26/2022 1455   BILITOT 1.1 07/13/2022 8338  BILITOT 0.6 05/26/2022 1455      Impression and Plan: Ms. Sistare is a very charming 61 year old white female.  She has CLL.  I think this is a low risk CLL.  I see nothing with her CLL right now that shows any significant activity.  I see that her white cell count is up a little bit.  However, her percentage of lymphocytes is still the same at 72%.  She is not anemic or thrombocytopenic.  I went over the criteria that I use to determine whether or not patient needs to be treated for CLL.  I explained to her and her husband that we do not want the treatment to be worse than the problem.  Since she is not symptomatic with the CLL, there is no indication that we have to treat.  I told her that if we ever had to treat her, we could probably use a time to find therapy.  For right now, we will plan to get her back in 6 more months.   12/15/202312:41 PM

## 2022-11-26 ENCOUNTER — Encounter: Payer: Self-pay | Admitting: Hematology & Oncology

## 2022-12-13 ENCOUNTER — Encounter: Payer: Self-pay | Admitting: Internal Medicine

## 2022-12-13 ENCOUNTER — Telehealth (INDEPENDENT_AMBULATORY_CARE_PROVIDER_SITE_OTHER): Payer: Managed Care, Other (non HMO) | Admitting: Internal Medicine

## 2022-12-13 VITALS — HR 71 | Ht 70.0 in | Wt 158.1 lb

## 2022-12-13 DIAGNOSIS — U071 COVID-19: Secondary | ICD-10-CM | POA: Diagnosis not present

## 2022-12-13 MED ORDER — NIRMATRELVIR/RITONAVIR (PAXLOVID)TABLET: 3.0000 | ORAL_TABLET | Freq: Two times a day (BID) | ORAL | 0 refills | Status: AC

## 2022-12-13 NOTE — Progress Notes (Signed)
Subjective:    Patient ID: [name], female    DOB: March 26, 1961, 62 y.o.   MRN: [id number]  DOS:  12/13/2022 Type of visit - description: Virtual Visit via Video Note  I connected with the above patient  by a video enabled telemedicine application and verified that I am speaking with the correct person using two identifiers.   Location of patient: home  Location of provider: office  Persons participating in the virtual visit: patient, provider   I discussed the limitations of evaluation and management by telemedicine and the availability of in person appointments. The patient expressed understanding and agreed to proceed.  Acute Symptoms started last night: Headaches, sneezing, achiness. At 5 AM in the morning, her temperature was 99.3. At 9 AM in the morning she tested positive for COVID.  Denies nausea vomiting. No rash   Review of Systems See above   Past Medical History:  Diagnosis Date   Allergic rhinitis    Allergy    dust   Anxiety    Back pain    CLL (chronic lymphocytic leukemia) (HCC) 12/16/2021   Contact lens/glasses fitting    Depression    Endometriosis    Fatigue    GERD (gastroesophageal reflux disease)    Goals of care, counseling/discussion 12/16/2021   Hiatal hernia    History of Ostium Secundum Atrial Septal Defect 1989   Status post repair in 1989/1990   Hx of migraines    IBS (irritable bowel syndrome)    Incontinence    Intestinal volvulus ([place]) 04/2006   Loss of appetite    Monoclonal B-cell lymphocytosis    Onychomycosis    Osteopenia of femoral neck 02/24/2020   Sleep apnea    uses mouthguard   Syncope 11/07/2019   Varicose veins of leg with edema, bilateral    RLE varicose vein ablation in 2008; LLE left SSV ablation September 2021    Past Surgical History:  Procedure Laterality Date   ABDOMINAL HYSTERECTOMY  2004   ASD REPAIR, SECUNDUM  1990   Dr. Jackie Plum SINUPLASTY Left    COLONOSCOPY     ENDOVENOUS  ABLATION SAPHENOUS VEIN W/ LASER Left 09/03/2020   EVLA  LSSV  and stab phlebectomy  10-20   EYE SURGERY     muscle reattachment 02/2017   hysterectomy for severe endometriosis     NASAL SEPTUM SURGERY     Dr. Georgia Lopes   OPEN REDUCTION INTERNAL FIXATION (ORIF) DISTAL RADIAL FRACTURE Right 06/20/2017   Procedure: OPEN REDUCTION INTERNAL FIXATION (ORIF) DISTAL RADIAL FRACTURE;  Surgeon: Leanora Cover, MD;  Location: [facility];  Service: Orthopedics;  Laterality: Right;   right ankle surgery  2010   right leg fracture with surgery     done with ankle surgery   TRANSTHORACIC ECHOCARDIOGRAM   January 2012   Normal LV size and function, EF greater than 55%. Mild LA dilation. No intra-atrial shunt with bubble study. Normal pulmonary pressures. Only trace to mild mitral and tricuspid regurgitation.   TRANSTHORACIC ECHOCARDIOGRAM  11/07/2019   EF 60 to 55%.  LVH.  GR 1 DD, with moderate LA dilation.  Relatively normal valves.  RA pressure estimated 8 mmHg.   UPPER GASTROINTESTINAL ENDOSCOPY     varicose laser vein surgery  2009    Current Outpatient Medications  Medication Instructions   acetaminophen (TYLENOL) 1,000 mg, Oral, Every 6 hours PRN   ASTAXANTHIN PO 12 mg, Oral, Daily   Azelastine HCl 137 MCG/SPRAY SOLN  1 spray, Each Nare, As needed   b complex vitamins capsule 1 capsule, Daily   Calcium Carb-Cholecalciferol 600-800 MG-UNIT TABS 1 tablet, Oral, 2 times daily   cycloSPORINE (RESTASIS) 0.05 % ophthalmic emulsion INSTILL 1 DROP INTO EACH EYE TWICE DAILY   ELDERBERRY PO 1,000 mg, Oral, Daily   EPIPEN 2-PAK 0.3 MG/0.3ML SOAJ injection No dose, route, or frequency recorded.   Green Tea, Camellia sinensis, (GREEN TEA 600) 600 MG CAPS Oral, Daily   levocetirizine (XYZAL) 5 mg, Oral, Every evening   linaclotide (LINZESS) 145 MCG CAPS capsule TAKE 1 CAPSULE BY MOUTH EVERY DAY BEFORE BREAKFAST   Magnesium Citrate 100 MG TABS Oral, Daily   MELATONIN FAST DISSOLVE PO Oral, Daily  at bedtime, 11/25/2022<BR>Takes (2) 15 mg dissolvable tabs.   metFORMIN (GLUCOPHAGE) 500 mg, Oral, Daily with breakfast   methocarbamol (ROBAXIN-750) 750 mg, Oral, Every 6 hours PRN   Misc Natural Products (JOINT HEALTH PO) Oral, Daily   montelukast (SINGULAIR) 10 MG tablet 1 tablet, Oral, Every evening   Multiple Vitamin (MULTIVITAMINS PO) 1 tablet, Oral, Daily,     Omega-3 Fatty Acids (FISH OIL PO) 500 mg, Oral, Daily   OVER THE COUNTER MEDICATION Daily, Nature Made Stress Relief   polyethylene glycol (MIRALAX / GLYCOLAX) 17 g, Oral, Daily   PRESCRIPTION MEDICATION 500 mg, Daily, Mushroom Complex   QUERCETIN PO 1,000 mg, Oral, Daily   Turmeric (QC TUMERIC COMPLEX) 500 MG CAPS Oral, Daily   valACYclovir (VALTREX) 500 mg, Oral, Daily   ZINC-VITAMIN C PO 2 capsules, Oral, Daily with breakfast       Objective:   Physical Exam Pulse 71   Ht '5\' 10"'$  (1.778 m)   Wt 158 lb 2 oz (71.7 kg)   SpO2 97%   BMI 22.69 kg/m  This is a virtual video visit, alert oriented x 3, in no physical or emotional distress, no cough noted, no dyspnea noted.    Assessment     62 year old female, history of CLL, presents with  COVID infection: Symptoms started last night, tested positive this morning. She is up-to-date on her flu, RSV and COVID vaccines. She has a history of CLL with no recent therapies. Recent creatinine normal. Plan: Rest, fluids, isolation for 1 week, start Paxlovid, she has taken it before and is familiar with the drug. No interactions noted. Call if not gradually better Seek medical attention if symptoms severe. She verbalized understanding of above      I discussed the assessment and treatment plan with the patient. The patient was provided an opportunity to ask questions and all were answered. The patient agreed with the plan and demonstrated an understanding of the instructions.   The patient was advised to call back or seek an in-person evaluation if the symptoms worsen or  if the condition fails to improve as anticipated.

## 2023-01-16 ENCOUNTER — Encounter: Payer: Self-pay | Admitting: Hematology & Oncology

## 2023-01-19 NOTE — Progress Notes (Deleted)
Cardiology Office Note:    Date:  01/19/2023   ID:  [name], DOB Oct 09, 1961, MRN [id number]  PCP:  Libby Maw, [place] Prov[facility] Cardiologist:  Glenetta Hew, MD { Click to update primary MD,subspecialty MD or APP then REFRESH:1}    Referring MD: Libby Maw,*   No chief complaint on file. ***  History of Present Illness:    [name] is a 62 y.o. female with a hx of ***  Past Medical History:  Diagnosis Date   Allergic rhinitis    Allergy    dust   Anxiety    Back pain    CLL (chronic lymphocytic leukemia) ([place]) 12/16/2021   Contact lens/glasses fitting    Depression    Endometriosis    Fatigue    GERD (gastroesophageal reflux disease)    Goals of care, counseling/discussion 12/16/2021   Hiatal hernia    History of Ostium Secundum Atrial Septal Defect 1989   Status post repair in 1989/1990   Hx of migraines    IBS (irritable bowel syndrome)    Incontinence    Intestinal volvulus ([place]) 04/2006   Loss of appetite    Monoclonal B-cell lymphocytosis    Onychomycosis    Osteopenia of femoral neck 02/24/2020   Sleep apnea    uses mouthguard   Syncope 11/07/2019   Varicose veins of leg with edema, bilateral    RLE varicose vein ablation in 2008; LLE left SSV ablation September 2021    Past Surgical History:  Procedure Laterality Date   ABDOMINAL HYSTERECTOMY  2004   ASD REPAIR, SECUNDUM  1990   Dr. Jackie Plum SINUPLASTY Left    COLONOSCOPY     ENDOVENOUS ABLATION SAPHENOUS VEIN W/ LASER Left 09/03/2020   EVLA  LSSV  and stab phlebectomy  10-20   EYE SURGERY     muscle reattachment 02/2017   hysterectomy for severe endometriosis     NASAL SEPTUM SURGERY     Dr. Georgia Lopes   OPEN REDUCTION INTERNAL FIXATION (ORIF) DISTAL RADIAL FRACTURE Right 06/20/2017   Procedure: OPEN REDUCTION INTERNAL FIXATION (ORIF) DISTAL RADIAL FRACTURE;  Surgeon: Leanora Cover, MD;  Location: [facility];   Service: Orthopedics;  Laterality: Right;   right ankle surgery  2010   right leg fracture with surgery     done with ankle surgery   TRANSTHORACIC ECHOCARDIOGRAM   January 2012   Normal LV size and function, EF greater than 55%. Mild LA dilation. No intra-atrial shunt with bubble study. Normal pulmonary pressures. Only trace to mild mitral and tricuspid regurgitation.   TRANSTHORACIC ECHOCARDIOGRAM  11/07/2019   EF 60 to 55%.  LVH.  GR 1 DD, with moderate LA dilation.  Relatively normal valves.  RA pressure estimated 8 mmHg.   UPPER GASTROINTESTINAL ENDOSCOPY     varicose laser vein surgery  2009    Current Medications: No outpatient medications have been marked as taking for the 01/24/23 encounter (Appointment) with Ledora Bottcher, [place].     Allergies:   Chlordiazepoxide-clidinium, Codeine, and Penicillins   Social History   Socioeconomic History   Marital status: Married    Spouse name: Richard   Number of children: Not on file   Years of education: Not on file   Highest education level: Not on file  Occupational History   Occupation: remodel houses    Employer: [employer]  Tobacco Use   Smoking status: Former  Packs/day: 1.00    Years: 10.00    Total pack years: 10.00    Types: Cigarettes    Quit date: 12/12/1988    Years since quitting: 34.1   Smokeless tobacco: Never  Vaping Use   Vaping Use: Never used  Substance and Sexual Activity   Alcohol use: Yes    Alcohol/week: 1.0 standard drink of alcohol    Types: 1 Glasses of wine per week    Comment: social use/wine rare   Drug use: No   Sexual activity: Yes  Other Topics Concern   Not on file  Social History Narrative   They have 2 children and at least two grandchildren. She does all her activities of daily living. She works as a Midwife for [employer].     She is very active doing an exercise class with a trainer 3 days a week at the gym, other than that she also teaches water  aerobics. She does other days of the gym and she is able to at least 60 minutes a day 5 days a week.   She is a former smoker who quit in 1990. This was after smoking a pack a day for 10 years. She drinks social alcohol on occasion.   Social Determinants of Health   Financial Resource Strain: Not on file  Food Insecurity: Not on file  Transportation Needs: Not on file  Physical Activity: Not on file  Stress: Not on file  Social Connections: Not on file     Family History: The patient's ***family history includes Clotting disorder in her maternal grandmother and sister; Colon cancer in her paternal grandfather; Diabetes in her mother; Heart disease in her maternal grandfather and sister; Lung cancer in her maternal grandmother; Prostate cancer in her maternal grandfather. There is no history of Colon polyps, Esophageal cancer, Rectal cancer, or Stomach cancer.  ROS:   Please see the history of present illness.    *** All other systems reviewed and are negative.  EKGs/Labs/Other Studies Reviewed:    The following studies were reviewed today: ***  EKG:  EKG is *** ordered today.  The ekg ordered today demonstrates ***  Recent Labs: 11/25/2022: ALT 29; BUN 18; Creatinine 0.69; Hemoglobin 14.2; Platelet Count 295; Potassium 3.6; Sodium 141  Recent Lipid Panel    Component Value Date/Time   CHOL 157 07/13/2022 0837   TRIG 82.0 07/13/2022 0837   HDL 49.00 07/13/2022 0837   CHOLHDL 3 07/13/2022 0837   VLDL 16.4 07/13/2022 0837   LDLCALC 91 07/13/2022 0837     Risk Assessment/Calculations:   {Does this patient have ATRIAL FIBRILLATION?[phone]}  No BP recorded.  {Refresh Note OR Click here to enter BP  :1}***         Physical Exam:    VS:  There were no vitals taken for this visit.    Wt Readings from Last 3 Encounters:  12/13/22 158 lb 2 oz (71.7 kg)  11/25/22 162 lb (73.5 kg)  11/16/22 163 lb (73.9 kg)     GEN: *** Well nourished, well developed in no acute  distress HEENT: Normal NECK: No JVD; No carotid bruits LYMPHATICS: No lymphadenopathy CARDIAC: ***RRR, no murmurs, rubs, gallops RESPIRATORY:  Clear to auscultation without rales, wheezing or rhonchi  ABDOMEN: Soft, non-tender, non-distended MUSCULOSKELETAL:  No edema; No deformity  SKIN: Warm and dry NEUROLOGIC:  Alert and oriented x 3 PSYCHIATRIC:  Normal affect   ASSESSMENT:    No diagnosis found. PLAN:    In  order of problems listed above:  ***      {Are you ordering a CV Procedure (e.g. stress test, cath, DCCV, TEE, etc)?   Press F2        :[id number]}    Medication Adjustments/Labs and Tests Ordered: Current medicines are reviewed at length with the patient today.  Concerns regarding medicines are outlined above.  No orders of the defined types were placed in this encounter.  No orders of the defined types were placed in this encounter.   There are no Patient Instructions on file for this visit.   Signed, Ledora Bottcher, [place]  01/19/2023 5:13 PM    [facility]

## 2023-01-22 NOTE — Progress Notes (Deleted)
Cardiology Clinic Note   Patient Name: [name] Date of Encounter: 01/22/2023  Primary Care Provider:  Libby Maw, MD Primary Cardiologist:  Glenetta Hew, MD  Patient Profile    [name] is a 62 y.o. female with a past medical history of atrial septal defect s/p repair, NSVT, bradycardia, syncope, GERD, CLL who presents to the clinic today for 1 year follow-up of chronic cardiac conditions.   Past Medical History    Past Medical History:  Diagnosis Date   Allergic rhinitis    Allergy    dust   Anxiety    Back pain    CLL (chronic lymphocytic leukemia) ([place]) 12/16/2021   Contact lens/glasses fitting    Depression    Endometriosis    Fatigue    GERD (gastroesophageal reflux disease)    Goals of care, counseling/discussion 12/16/2021   Hiatal hernia    History of Ostium Secundum Atrial Septal Defect 1989   Status post repair in 1989/1990   Hx of migraines    IBS (irritable bowel syndrome)    Incontinence    Intestinal volvulus ([place]) 04/2006   Loss of appetite    Monoclonal B-cell lymphocytosis    Onychomycosis    Osteopenia of femoral neck 02/24/2020   Sleep apnea    uses mouthguard   Syncope 11/07/2019   Varicose veins of leg with edema, bilateral    RLE varicose vein ablation in 2008; LLE left SSV ablation September 2021   Past Surgical History:  Procedure Laterality Date   ABDOMINAL HYSTERECTOMY  2004   ASD REPAIR, SECUNDUM  1990   Dr. Jackie Plum SINUPLASTY Left    COLONOSCOPY     ENDOVENOUS ABLATION SAPHENOUS VEIN W/ LASER Left 09/03/2020   EVLA  LSSV  and stab phlebectomy  10-20   EYE SURGERY     muscle reattachment 02/2017   hysterectomy for severe endometriosis     NASAL SEPTUM SURGERY     Dr. Georgia Lopes   OPEN REDUCTION INTERNAL FIXATION (ORIF) DISTAL RADIAL FRACTURE Right 06/20/2017   Procedure: OPEN REDUCTION INTERNAL FIXATION (ORIF) DISTAL RADIAL FRACTURE;  Surgeon: Leanora Cover, MD;  Location: [place] [facility];  Service: Orthopedics;  Laterality: Right;   right ankle surgery  2010   right leg fracture with surgery     done with ankle surgery   TRANSTHORACIC ECHOCARDIOGRAM   January 2012   Normal LV size and function, EF greater than 55%. Mild LA dilation. No intra-atrial shunt with bubble study. Normal pulmonary pressures. Only trace to mild mitral and tricuspid regurgitation.   TRANSTHORACIC ECHOCARDIOGRAM  11/07/2019   EF 60 to 55%.  LVH.  GR 1 DD, with moderate LA dilation.  Relatively normal valves.  RA pressure estimated 8 mmHg.   UPPER GASTROINTESTINAL ENDOSCOPY     varicose laser vein surgery  2009    Allergies  Allergies  Allergen Reactions   Chlordiazepoxide-Clidinium Other (See Comments)    "loopy"   Codeine Nausea And Vomiting    Dizziness   Penicillins Hives    History of Present Illness    [name] has a past medical history of: Atrial septal defect.  S/p ASD in the 1990s.  Echo 11/07/2019: EF 60-65%. Grade I DD. RV mildly enlarged. Moderate LAE. Trace MR. RA pressure 8 mmHg.  No atrial level shunt detected.  No ventricular septal defect seen or detected. NSVT/bradycardia.  Event monitor 12/23/2019: Predominate rhythm is sinus rhythm ranging from 43 bpm to  148 bpm with average heart rate 68 bpm. 1 run of 11 beats of NSVT, asymptomatic. Concern for rate control agent secondary to bradycardia.  Nuclear stress test 11/29/2019: Normal, low risk study.  Syncope. GERD.  CLL.   [name] is a longtime patient of cardiology.  She initially was followed by Dr. Rex Kras in the 1990s and underwent ASD repair by Dr. Servando Snare during that time.  She was initially evaluated by Dr. Ellyn Hack in 2012 continues to be followed for the above outlined history.  She was last seen in the office by Dr. Ellyn Hack on 09/30/2022.  At that time she was doing well from a cardiac standpoint, however she had recently been diagnosed with CLL for which she was followed by Dr.  Marin Olp.  Today, patient ***  Atrial septal defect.  S/p ASD in the 1990s.  Patient *** NSVT/bradycardia.  Patient ***  Syncope. ***  Home Medications    No outpatient medications have been marked as taking for the 01/24/23 encounter (Appointment) with Ledora Bottcher, [place].    Family History    Family History  Problem Relation Age of Onset   Diabetes Mother    Clotting disorder Sister    Heart disease Sister    Lung cancer Maternal Grandmother        with mets to the brain   Clotting disorder Maternal Grandmother    Heart disease Maternal Grandfather    Prostate cancer Maternal Grandfather    Colon cancer Paternal Grandfather    Colon polyps Neg Hx    Esophageal cancer Neg Hx    Rectal cancer Neg Hx    Stomach cancer Neg Hx    She indicated that her mother is alive. She indicated that her father is deceased. She indicated that only one of her four sisters is alive. She reported the following about one of her unknown sisters: a/w. She indicated that the status of her maternal grandmother is unknown. She indicated that the status of her maternal grandfather is unknown. She indicated that the status of her paternal grandfather is unknown. She indicated that the status of her neg hx is unknown.   Social History    Social History   Socioeconomic History   Marital status: Married    Spouse name: Richard   Number of children: Not on file   Years of education: Not on file   Highest education level: Not on file  Occupational History   Occupation: remodel houses    Employer: [employer]  Tobacco Use   Smoking status: Former    Packs/day: 1.00    Years: 10.00    Total pack years: 10.00    Types: Cigarettes    Quit date: 12/12/1988    Years since quitting: 34.1   Smokeless tobacco: Never  Vaping Use   Vaping Use: Never used  Substance and Sexual Activity   Alcohol use: Yes    Alcohol/week: 1.0 standard drink of alcohol    Types: 1 Glasses of wine per week     Comment: social use/wine rare   Drug use: No   Sexual activity: Yes  Other Topics Concern   Not on file  Social History Narrative   They have 2 children and at least two grandchildren. She does all her activities of daily living. She works as a Midwife for [employer].     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 She does other days of the gym and she is able to at least 60 minutes a day 5 days a week.   She is a former smoker who quit in 1990. This was after smoking a pack a day for 10 years. She drinks social alcohol on occasion.   Social Determinants of Health   Financial Resource Strain: Not on file  Food Insecurity: Not on file  Transportation Needs: Not on file  Physical Activity: Not on file  Stress: Not on file  Social Connections: Not on file  Intimate Partner Violence: Not on file     Review of Systems    General: *** No chills, fever, night sweats or weight changes.  Cardiovascular:  No chest pain, dyspnea on exertion, edema, orthopnea, palpitations, paroxysmal nocturnal dyspnea. Dermatological: No rash, lesions/masses Respiratory: No cough, dyspnea Urologic: No hematuria, dysuria Abdominal:   No nausea, vomiting, diarrhea, bright red blood per rectum, melena, or hematemesis Neurologic:  No visual changes, weakness, changes in mental status. All other systems reviewed and are otherwise negative except as noted above.  Physical Exam    VS:  There were no vitals taken for this visit. , BMI There is no height or weight on file to calculate BMI. GEN: *** Well nourished, well developed, in no acute distress. HEENT: Normal. Neck: Supple, no JVD, carotid bruits, or masses. Cardiac: RRR, no murmurs, rubs, or gallops. No clubbing, cyanosis, edema.  Radials/DP/PT 2+ and equal bilaterally.  Respiratory:  Respirations regular and unlabored, clear to auscultation  bilaterally. GI: Soft, nontender, nondistended. MS: No deformity or atrophy. Skin: Warm and dry, no rash. Neuro: Strength and sensation are intact. Psych: Normal affect.  Accessory Clinical Findings    The following studies were reviewed for this visit: ***  Recent Labs: 11/25/2022: ALT 29; BUN 18; Creatinine 0.69; Hemoglobin 14.2; Platelet Count 295; Potassium 3.6; Sodium 141   Recent Lipid Panel    Component Value Date/Time   CHOL 157 07/13/2022 0837   TRIG 82.0 07/13/2022 0837   HDL 49.00 07/13/2022 0837   CHOLHDL 3 07/13/2022 0837   VLDL 16.4 07/13/2022 0837   LDLCALC 91 07/13/2022 0837    No BP recorded.  {Refresh Note OR Click here to enter BP  :1}***    ECG personally reviewed by me today: ***  No significant changes from ***  {Does this patient have ATRIAL FIBRILLATION?:[phone]}   Assessment & Plan   ***      Disposition: ***   Justice Britain. Zsofia Prout, DNP, NP-C     01/22/2023, 5:31 PM [place] [address] Office [phone] Fax [phone]

## 2023-01-24 ENCOUNTER — Ambulatory Visit: Payer: Managed Care, Other (non HMO) | Admitting: Physician Assistant

## 2023-01-31 ENCOUNTER — Other Ambulatory Visit: Payer: Self-pay | Admitting: Internal Medicine

## 2023-02-20 NOTE — Progress Notes (Unsigned)
Cardiology Office Note:    Date:  02/22/2023   ID:  [name], DOB 1961-07-10, MRN [id number]  PCP:  Libby Maw, MD [place] Cardiologist: Glenetta Hew, MD    Reason for visit: 1 year follow-up  History of Present Illness:    [name] is a 62 y.o. female with a hx of atrial septal defect s/p repair in 1989, varicose veins status post SSV ablation, asymptomatic bradycardia, vasovagal syncope and CLL.  She last saw Dr. Ellyn Hack in January 2023.  She had no further episodes of syncope.  Her syncopal episode was thought likely micturition syncope - only occurrence.  Recommended aggressive hydration.  Today, she is doing well.  No further presyncope/syncope.  She states she was diagnosed with CLL last year.  She is currently under the watch and wait status with a good prognosis.  She has a 13 Q mutation per pt.  She states there is a [facility] team has found that exercise can help CLL patients.  She is working out 3-4 times per week and starting to incorporate running.  With exercise, she denies chest pain, shortness of breath and lightheadedness.  She denies significant palpitations and LE edema.  She is doing weight watchers and has lost 25 pounds since starting it.  She plans to retire in the next 1 to 2 years.  She works in Warehouse manager for homes.  [place]     Past Medical History:  Diagnosis Date   Allergic rhinitis    Allergy    dust   Anxiety    Back pain    CLL (chronic lymphocytic leukemia) ([facility]) 12/16/2021   Contact lens/glasses fitting    Depression    Endometriosis    Fatigue    GERD (gastroesophageal reflux disease)    Goals of care, counseling/discussion 12/16/2021   Hiatal hernia    History of Ostium Secundum Atrial Septal Defect 1989   Status post repair in 1989/1990   Hx of migraines    IBS (irritable bowel syndrome)    Incontinence    Intestinal volvulus ([place]) 04/2006   Loss of appetite    Monoclonal  B-cell lymphocytosis    Onychomycosis    Osteopenia of femoral neck 02/24/2020   Sleep apnea    uses mouthguard   Syncope 11/07/2019   Varicose veins of leg with edema, bilateral    RLE varicose vein ablation in 2008; LLE left SSV ablation September 2021    Past Surgical History:  Procedure Laterality Date   ABDOMINAL HYSTERECTOMY  2004   ASD REPAIR, SECUNDUM  1990   Dr. Jackie Plum SINUPLASTY Left    COLONOSCOPY     ENDOVENOUS ABLATION SAPHENOUS VEIN W/ LASER Left 09/03/2020   EVLA  LSSV  and stab phlebectomy  10-20   EYE SURGERY     muscle reattachment 02/2017   hysterectomy for severe endometriosis     NASAL SEPTUM SURGERY     Dr. Georgia Lopes   OPEN REDUCTION INTERNAL FIXATION (ORIF) DISTAL RADIAL FRACTURE Right 06/20/2017   Procedure: OPEN REDUCTION INTERNAL FIXATION (ORIF) DISTAL RADIAL FRACTURE;  Surgeon: Leanora Cover, MD;  Location: [facility];  Service: Orthopedics;  Laterality: Right;   right ankle surgery  2010   right leg fracture with surgery     done with ankle surgery   TRANSTHORACIC ECHOCARDIOGRAM   January 2012   Normal LV size and function, EF greater than 55%. Mild LA dilation. No  intra-atrial shunt with bubble study. Normal pulmonary pressures. Only trace to mild mitral and tricuspid regurgitation.   TRANSTHORACIC ECHOCARDIOGRAM  11/07/2019   EF 60 to 55%.  LVH.  GR 1 DD, with moderate LA dilation.  Relatively normal valves.  RA pressure estimated 8 mmHg.   UPPER GASTROINTESTINAL ENDOSCOPY     varicose laser vein surgery  2009    Current Medications: Current Meds  Medication Sig   acetaminophen (TYLENOL) 500 MG tablet Take 1,000 mg by mouth every 6 (six) hours as needed for mild pain.   ASTAXANTHIN PO Take 12 mg by mouth daily.   Azelastine HCl 137 MCG/SPRAY SOLN Place 1 spray into both nostrils as needed.   b complex vitamins capsule Take 1 capsule by mouth daily.   Calcium Carb-Cholecalciferol 600-800 MG-UNIT TABS Take 1 tablet by  mouth 2 (two) times daily.   cycloSPORINE (RESTASIS) 0.05 % ophthalmic emulsion INSTILL 1 DROP INTO EACH EYE TWICE DAILY   ELDERBERRY PO Take 1,000 mg by mouth daily.   EPIPEN 2-PAK 0.3 MG/0.3ML SOAJ injection    Green Tea, Camellia sinensis, (GREEN TEA 600) 600 MG CAPS Take by mouth daily.   levocetirizine (XYZAL) 5 MG tablet Take 5 mg by mouth every evening.    linaclotide (LINZESS) 145 MCG CAPS capsule Take 1 capsule (145 mcg total) by mouth daily before breakfast. Office visit for further refills   Magnesium Citrate 100 MG TABS Take by mouth daily.   MELATONIN FAST DISSOLVE PO Take by mouth at bedtime. 11/25/2022 Takes (2) 15 mg dissolvable tabs.   metFORMIN (GLUCOPHAGE) 500 MG tablet Take 1 tablet by mouth once daily with breakfast   methocarbamol (ROBAXIN-750) 750 MG tablet Take 1 tablet (750 mg total) by mouth every 6 (six) hours as needed for muscle spasms.   Misc Natural Products (JOINT HEALTH PO) Take by mouth daily.   montelukast (SINGULAIR) 10 MG tablet Take 1 tablet by mouth every evening.   Multiple Vitamin (MULTIVITAMINS PO) Take 1 tablet by mouth daily.   Omega-3 Fatty Acids (FISH OIL PO) Take 500 mg by mouth daily.   OVER THE COUNTER MEDICATION daily. Nature Made Stress Relief   polyethylene glycol (MIRALAX / GLYCOLAX) packet Take 17 g by mouth daily.   PRESCRIPTION MEDICATION 500 mg daily. Mushroom Complex   QUERCETIN PO Take 1,000 mg by mouth daily.   Turmeric (QC TUMERIC COMPLEX) 500 MG CAPS Take by mouth daily.   valACYclovir (VALTREX) 500 MG tablet Take 1 tablet (500 mg total) by mouth daily.   ZINC-VITAMIN C PO Take 2 capsules by mouth daily with breakfast.     Allergies:   Chlordiazepoxide-clidinium, Codeine, and Penicillins   Social History   Socioeconomic History   Marital status: Married    Spouse name: Richard   Number of children: Not on file   Years of education: Not on file   Highest education level: Not on file  Occupational History   Occupation:  remodel houses    Employer: [employer]  Tobacco Use   Smoking status: Former    Packs/day: 1.00    Years: 10.00    Total pack years: 10.00    Types: Cigarettes    Quit date: 12/12/1988    Years since quitting: 34.2   Smokeless tobacco: Never  Vaping Use   Vaping Use: Never used  Substance and Sexual Activity   Alcohol use: Yes    Alcohol/week: 1.0 standard drink of alcohol    Types: 1 Glasses of wine  per week    Comment: social use/wine rare   Drug use: No   Sexual activity: Yes  Other Topics Concern   Not on file  Social History Narrative   They have 2 children and at least two grandchildren. She does all her activities of daily living. She works as a Midwife for [employer].     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 She does other days of the gym and she is able to at least 60 minutes a day 5 days a week.   She is a former smoker who quit in 1990. This was after smoking a pack a day for 10 years. She drinks social alcohol on occasion.   Social Determinants of Health   Financial Resource Strain: Not on file  Food Insecurity: Not on file  Transportation Needs: Not on file  Physical Activity: Not on file  Stress: Not on file  Social Connections: Not on file     Family History: The patient's family history includes Clotting disorder in her maternal grandmother and sister; Colon cancer in her paternal grandfather; Diabetes in her mother; Heart disease in her maternal grandfather and sister; Lung cancer in her maternal grandmother; Prostate cancer in her maternal grandfather. There is no history of Colon polyps, Esophageal cancer, Rectal cancer, or Stomach cancer.  ROS:   Please see the history of present illness.     EKGs/Labs/Other Studies Reviewed:    EKG:  The ekg ordered today demonstrates sinus bradycardia heart rate 59.  Recent Labs: 11/25/2022: ALT  29; BUN 18; Creatinine 0.69; Hemoglobin 14.2; Platelet Count 295; Potassium 3.6; Sodium 141   Recent Lipid Panel Lab Results  Component Value Date/Time   CHOL 157 07/13/2022 08:37 AM   TRIG 82.0 07/13/2022 08:37 AM   HDL 49.00 07/13/2022 08:37 AM   LDLCALC 91 07/13/2022 08:37 AM    Physical Exam:    VS:  BP (!) 88/52 (BP Location: Left Arm, Patient Position: Sitting, Cuff Size: Normal)   Pulse (!) 59   Ht '5\' 10"'$  (1.778 m)   Wt 157 lb (71.2 kg)   SpO2 98%   BMI 22.53 kg/m    No data found.       Wt Readings from Last 3 Encounters:  02/22/23 157 lb (71.2 kg)  12/13/22 158 lb 2 oz (71.7 kg)  11/25/22 162 lb (73.5 kg)     GEN:  Well nourished, well developed in no acute distress HEENT: Normal NECK: No JVD; No carotid bruits CARDIAC: RRR, no murmurs, rubs, gallops RESPIRATORY:  Clear to auscultation without rales, wheezing or rhonchi  ABDOMEN: Soft, non-tender, non-distended MUSCULOSKELETAL: No edema SKIN: Warm and dry NEUROLOGIC:  Alert and oriented PSYCHIATRIC:  Normal affect     ASSESSMENT AND PLAN   History of ASD status post repair in 1989 -No ASD detected on echo 2020 -Repeat echo next year  Hx of vasovagal syncope - no recurrence -Event monitor 2021 unremarkable -Lexiscan Myoview 2020 with no ischemia -2D echo 2020 with EF 6065%, mild LVH, grade 1 diastolic dysfunction, mildly dilated LA, no significant valve disease  Preventative cardiac care -Not diabetic; normal weight, intermittent smoker from age [age] -Lipids August 2023 with LDL 91 -BP: low-normal -Fam hx of CAD: sister CAD in her 38s (unhealthy lifestyle) -Recommend DASH diet (high in vegetables, fruits, low-fat dairy products, whole grains, poultry, fish, and nuts and low  in sweets, sugar-sweetened beverages, and red meats), and regular physical activity.  Disposition - Follow-up in 1 year with echo.   Medication Adjustments/Labs and Tests Ordered: Current medicines are reviewed at length  with the patient today.  Concerns regarding medicines are outlined above.  Orders Placed This Encounter  Procedures   EKG 12-Lead   ECHOCARDIOGRAM COMPLETE   No orders of the defined types were placed in this encounter.   Patient Instructions  Medication Instructions:  No Changes *If you need a refill on your cardiac medications before your next appointment, please call your pharmacy*   Lab Work: No Labs If you have labs (blood work) drawn today and your tests are completely normal, you will receive your results only by: [place] (if you have MyChart) OR A paper copy in the mail If you have any lab test that is abnormal or we need to change your treatment, we will call you to review the results.   Testing/Procedures:[address], [address]. ( To Be Done In 1 Year). Your physician has requested that you have an echocardiogram. Echocardiography is a painless test that uses sound waves to create images of your heart. It provides your doctor with information about the size and shape of your heart and how well your heart's chambers and valves are working. This procedure takes approximately one hour. There are no restrictions for this procedure. Please do NOT wear cologne, perfume, aftershave, or lotions (deodorant is allowed). Please arrive 15 minutes prior to your appointment time.    Follow-Up: At [facility], you and your health needs are our priority.  As part of our continuing mission to provide you with exceptional heart care, we have created designated Provider Care Teams.  These Care Teams include your primary Cardiologist (physician) and Advanced Practice Providers (APPs -  Physician Assistants and Nurse Practitioners) who all work together to provide you with the care you need, when you need it.  We recommend signing up for the patient portal called "MyChart".  Sign up information is provided on this After Visit Summary.  MyChart is used to connect  with patients for Virtual Visits (Telemedicine).  Patients are able to view lab/test results, encounter notes, upcoming appointments, etc.  Non-urgent messages can be sent to your provider as well.   To learn more about what you can do with MyChart, go to [email or web address].    Your next appointment:   1 year(s)  Provider:   Glenetta Hew, MD     Signed, Warren Lacy, PA-C  02/22/2023 8:43 AM    [place]

## 2023-02-22 ENCOUNTER — Encounter: Payer: Self-pay | Admitting: Physician Assistant

## 2023-02-22 ENCOUNTER — Ambulatory Visit: Payer: Managed Care, Other (non HMO) | Attending: Cardiology | Admitting: Physician Assistant

## 2023-02-22 VITALS — BP 88/52 | HR 59 | Ht 70.0 in | Wt 157.0 lb

## 2023-02-22 DIAGNOSIS — R55 Syncope and collapse: Secondary | ICD-10-CM | POA: Diagnosis not present

## 2023-02-22 DIAGNOSIS — Q2111 Secundum atrial septal defect: Secondary | ICD-10-CM | POA: Diagnosis not present

## 2023-02-22 NOTE — Patient Instructions (Signed)
Medication Instructions:  No Changes *If you need a refill on your cardiac medications before your next appointment, please call your pharmacy*   Lab Work: No Labs If you have labs (blood work) drawn today and your tests are completely normal, you will receive your results only by: [place] (if you have MyChart) OR A paper copy in the mail If you have any lab test that is abnormal or we need to change your treatment, we will call you to review the results.   Testing/Procedures:[address], [address]. ( To Be Done In 1 Year). Your physician has requested that you have an echocardiogram. Echocardiography is a painless test that uses sound waves to create images of your heart. It provides your doctor with information about the size and shape of your heart and how well your heart's chambers and valves are working. This procedure takes approximately one hour. There are no restrictions for this procedure. Please do NOT wear cologne, perfume, aftershave, or lotions (deodorant is allowed). Please arrive 15 minutes prior to your appointment time.    Follow-Up: At [facility], you and your health needs are our priority.  As part of our continuing mission to provide you with exceptional heart care, we have created designated Provider Care Teams.  These Care Teams include your primary Cardiologist (physician) and Advanced Practice Providers (APPs -  Physician Assistants and Nurse Practitioners) who all work together to provide you with the care you need, when you need it.  We recommend signing up for the patient portal called "MyChart".  Sign up information is provided on this After Visit Summary.  MyChart is used to connect with patients for Virtual Visits (Telemedicine).  Patients are able to view lab/test results, encounter notes, upcoming appointments, etc.  Non-urgent messages can be sent to your provider as well.   To learn more about what you can do with MyChart, go to  [email or web address].    Your next appointment:   1 year(s)  Provider:   Glenetta Hew, MD

## 2023-03-06 ENCOUNTER — Encounter: Payer: Self-pay | Admitting: Hematology & Oncology

## 2023-04-13 ENCOUNTER — Ambulatory Visit: Payer: Managed Care, Other (non HMO) | Admitting: Cardiology

## 2023-04-24 ENCOUNTER — Other Ambulatory Visit: Payer: Self-pay | Admitting: Internal Medicine

## 2023-04-24 ENCOUNTER — Telehealth: Payer: Self-pay | Admitting: Physician Assistant

## 2023-04-24 MED ORDER — LINACLOTIDE 145 MCG PO CAPS: 145.0000 ug | ORAL_CAPSULE | Freq: Every day | ORAL | 2 refills | Status: DC

## 2023-04-24 NOTE — Telephone Encounter (Signed)
Refill sent to pharmacy.   

## 2023-04-24 NOTE — Telephone Encounter (Signed)
Inbound call from patient requesting to schedule an office visit for her Linzess medication refill. She is scheduled for the next avaible office visit. Requesting a refill of medication until she is seen in office. Please advise, thank you.

## 2023-05-02 ENCOUNTER — Encounter: Payer: Self-pay | Admitting: Nurse Practitioner

## 2023-05-02 ENCOUNTER — Ambulatory Visit: Payer: Managed Care, Other (non HMO) | Admitting: Nurse Practitioner

## 2023-05-02 VITALS — BP 90/54 | Ht 70.25 in | Wt 156.0 lb

## 2023-05-02 DIAGNOSIS — Z780 Asymptomatic menopausal state: Secondary | ICD-10-CM | POA: Diagnosis not present

## 2023-05-02 DIAGNOSIS — M85851 Other specified disorders of bone density and structure, right thigh: Secondary | ICD-10-CM

## 2023-05-02 DIAGNOSIS — Z01419 Encounter for gynecological examination (general) (routine) without abnormal findings: Secondary | ICD-10-CM | POA: Diagnosis not present

## 2023-05-02 NOTE — Progress Notes (Signed)
   [name] January 08, 1961 [id number]   History:  62 y.o. G2P2 presents as new patient to establish care. No GYN concerns. Postmenopausal - no HRT. S/P 2004 TAH for endometriosis. Normal pap history. Sees cardiology for atrial septal defect s/p repair. Diagnosed with CLL last year, currently under watch and wait status with good prognosis.   Gynecologic History No LMP recorded. Patient has had a hysterectomy.   Contraception/Family planning: status post hysterectomy Sexually active: Yes  Health Maintenance Last Pap: 2023. Results were: Normal per patient Last mammogram: 2023. Results were: Normal per patient Last colonoscopy: 12/02/2019. Results were: Normal, 10-year recall Last Dexa: 02/24/2020. Results were: T-score -1.2 of right femoral neck  Past medical history, past surgical history, family history and social history were all reviewed and documented in the EPIC chart. Married. Emergency planning/management officer for [employer]. 2 daughters, 1 stepdaughter, 6 grandchildren. Helping care for mother who is in nursing facility.  ROS:  A ROS was performed and pertinent positives and negatives are included.  Exam:  Vitals:   05/02/23 1427  BP: (!) 90/54  Weight: 156 lb (70.8 kg)  Height: 5' 10.25" (1.784 m)   Body mass index is 22.22 kg/m.  General appearance:  Normal Thyroid:  Symmetrical, normal in size, without palpable masses or nodularity. Respiratory  Auscultation:  Clear without wheezing or rhonchi Cardiovascular  Auscultation:  Regular rate, without rubs, murmurs or gallops  Edema/varicosities:  Not grossly evident Abdominal  Soft,nontender, without masses, guarding or rebound.  Liver/spleen:  No organomegaly noted  Hernia:  None appreciated  Skin  Inspection:  Grossly normal Breasts: Examined lying and sitting.   Right: Without masses, retractions, nipple discharge or axillary adenopathy.   Left: Without masses, retractions, nipple discharge or axillary  adenopathy. Genitourinary   Inguinal/mons:  Normal without inguinal adenopathy  External genitalia:  Normal appearing vulva with no masses, tenderness, or lesions  BUS/Urethra/Skene's glands:  Normal  Vagina:  Normal appearing with normal color and discharge, no lesions  Cervix:  And uterus absent  Adnexa/parametria:     Rt: Normal in size, without masses or tenderness.   Lt: Normal in size, without masses or tenderness.  Anus and perineum: Normal  Digital rectal exam: Deferred  Patient informed chaperone available to be present for breast and pelvic exam. Patient has requested no chaperone to be present. Patient has been advised what will be completed during breast and pelvic exam.   Assessment/Plan:  62 y.o. G2P2 to establish care.   Well female exam with routine gynecological exam - Education provided on SBEs, importance of preventative screenings, current guidelines, high calcium diet, regular exercise, and multivitamin daily. Labs with PCP.   Postmenopausal - no HRT. S/P 2004 TAH for endometriosis  Osteopenia of neck of right femur - T-score -1.2 in right femur only. Very active in cardio and weight training. Taught water aerobics for years. Taking Vit D + Calcium. Recommend repeating DXA next year.   Screening for cervical cancer - Normal Pap history.  No longer screening per guidelines.   Screening for breast cancer - Normal mammogram history.  Continue annual screenings.  Normal breast exam today.  Screening for colon cancer - 2020 colonoscopy. Will repeat at 10-year interval per GI's recommendation.   Return in 1 year for annual.     [name] DNP, 3:10 PM 05/02/2023

## 2023-05-22 ENCOUNTER — Encounter: Payer: Self-pay | Admitting: Hematology & Oncology

## 2023-05-26 ENCOUNTER — Encounter: Payer: Self-pay | Admitting: Hematology & Oncology

## 2023-05-26 ENCOUNTER — Inpatient Hospital Stay: Payer: Managed Care, Other (non HMO) | Attending: Hematology & Oncology

## 2023-05-26 ENCOUNTER — Inpatient Hospital Stay (HOSPITAL_BASED_OUTPATIENT_CLINIC_OR_DEPARTMENT_OTHER): Payer: Managed Care, Other (non HMO) | Admitting: Hematology & Oncology

## 2023-05-26 ENCOUNTER — Other Ambulatory Visit: Payer: Self-pay

## 2023-05-26 VITALS — BP 98/53 | HR 55 | Temp 98.2°F | Resp 18 | Ht 70.0 in | Wt 156.8 lb

## 2023-05-26 DIAGNOSIS — C9110 Chronic lymphocytic leukemia of B-cell type not having achieved remission: Secondary | ICD-10-CM | POA: Diagnosis not present

## 2023-05-26 DIAGNOSIS — Z79624 Long term (current) use of inhibitors of nucleotide synthesis: Secondary | ICD-10-CM | POA: Insufficient documentation

## 2023-05-26 DIAGNOSIS — Z79899 Other long term (current) drug therapy: Secondary | ICD-10-CM | POA: Insufficient documentation

## 2023-05-26 LAB — CMP (CANCER CENTER ONLY)
ALT: 28 U/L (ref 0–44)
AST: 29 U/L (ref 15–41)
Albumin: 4.5 g/dL (ref 3.5–5.0)
Alkaline Phosphatase: 54 U/L (ref 38–126)
Anion gap: 7 (ref 5–15)
BUN: 24 mg/dL — ABNORMAL HIGH (ref 8–23)
CO2: 31 mmol/L (ref 22–32)
Calcium: 9.4 mg/dL (ref 8.9–10.3)
Chloride: 104 mmol/L (ref 98–111)
Creatinine: 0.78 mg/dL (ref 0.44–1.00)
GFR, Estimated: >60 mL/min (ref >60)
Glucose, Bld: 89 mg/dL (ref 70–99)
Potassium: 3.9 mmol/L (ref 3.5–5.1)
Sodium: 142 mmol/L (ref 135–145)
Total Bilirubin: 0.9 mg/dL (ref 0.3–1.2)
Total Protein: 6.8 g/dL (ref 6.5–8.1)

## 2023-05-26 LAB — SAVE SMEAR(SSMR), FOR PROVIDER SLIDE REVIEW

## 2023-05-26 LAB — CBC WITH DIFFERENTIAL (CANCER CENTER ONLY)
Abs Immature Granulocytes: 0.02 10*3/uL (ref 0.00–0.07)
Basophils Absolute: 0.1 10*3/uL (ref 0.0–0.1)
Basophils Relative: 1 %
Eosinophils Absolute: 0.2 10*3/uL (ref 0.0–0.5)
Eosinophils Relative: 1 %
HCT: 43.2 % (ref 36.0–46.0)
Hemoglobin: 14.3 g/dL (ref 12.0–15.0)
Immature Granulocytes: 0 %
Lymphocytes Relative: 67 %
Lymphs Abs: 11.3 10*3/uL — ABNORMAL HIGH (ref 0.7–4.0)
MCH: 32 pg (ref 26.0–34.0)
MCHC: 33.1 g/dL (ref 30.0–36.0)
MCV: 96.6 fL (ref 80.0–100.0)
Monocytes Absolute: 0.8 10*3/uL (ref 0.1–1.0)
Monocytes Relative: 5 %
Neutro Abs: 4.5 10*3/uL (ref 1.7–7.7)
Neutrophils Relative %: 26 %
Platelet Count: 264 10*3/uL (ref 150–400)
RBC: 4.47 MIL/uL (ref 3.87–5.11)
RDW: 12.6 % (ref 11.5–15.5)
WBC Count: 16.9 10*3/uL — ABNORMAL HIGH (ref 4.0–10.5)
nRBC: 0 % (ref 0.0–0.2)

## 2023-05-26 LAB — LACTATE DEHYDROGENASE: LDH: 151 U/L (ref 98–192)

## 2023-05-26 NOTE — Progress Notes (Signed)
Hematology and Oncology Follow Up Visit  [name] [id number] [birth year] 62 y.o. 05/26/2023   Principle Diagnosis:  CLL-  Stage A  --  13q-/ IVGH mutated  Current Therapy:   Observation     Interim History:  [name] is back for a follow-up.  We see her every 6 months.  As always, she is doing quite nicely.  She is still busy.  She is still working.  She is planning for a wonderful summer.  She has had no problems with infections.  She has had no fever.  There is been no bleeding.  There is been no change in bowel or bladder habits.  There is been no rashes.  She has had no leg swelling.  She continues to exercise.  She looks incredibly fit.  Currently, I would say her performance status is ECOG 0.    Medications:  Current Outpatient Medications:    acetaminophen (TYLENOL) 500 MG tablet, Take 1,000 mg by mouth every 6 (six) hours as needed for mild pain., Disp: , Rfl:    ASTAXANTHIN PO, Take 12 mg by mouth daily., Disp: , Rfl:    Azelastine HCl 137 MCG/SPRAY SOLN, Place 1 spray into both nostrils as needed., Disp: , Rfl:    b complex vitamins capsule, Take 1 capsule by mouth daily., Disp: , Rfl:    Calcium Carb-Cholecalciferol 600-800 MG-UNIT TABS, Take 1 tablet by mouth 2 (two) times daily., Disp: , Rfl:    cycloSPORINE (RESTASIS) 0.05 % ophthalmic emulsion, INSTILL 1 DROP INTO EACH EYE TWICE DAILY, Disp: 60 each, Rfl: 0   levocetirizine (XYZAL) 5 MG tablet, Take 5 mg by mouth every evening. , Disp: , Rfl:    linaclotide (LINZESS) 145 MCG CAPS capsule, Take 1 capsule (145 mcg total) by mouth daily before breakfast., Disp: 30 capsule, Rfl: 2   Magnesium Citrate 100 MG TABS, Take by mouth daily., Disp: , Rfl:    MELATONIN FAST DISSOLVE PO, Take by mouth at bedtime. 11/25/2022 Takes (2) 15 mg dissolvable tabs., Disp: , Rfl:    metFORMIN (GLUCOPHAGE) 500 MG tablet, Take 1 tablet by mouth once daily with breakfast, Disp: 90 tablet, Rfl: 3   methocarbamol (ROBAXIN-750) 750 MG  tablet, Take 1 tablet (750 mg total) by mouth every 6 (six) hours as needed for muscle spasms., Disp: 60 tablet, Rfl: 0   Misc Natural Products (JOINT HEALTH PO), Take by mouth daily., Disp: , Rfl:    montelukast (SINGULAIR) 10 MG tablet, Take 1 tablet by mouth every evening., Disp: , Rfl:    Multiple Vitamin (MULTIVITAMINS PO), Take 1 tablet by mouth daily., Disp: , Rfl:    Omega-3 Fatty Acids (FISH OIL PO), Take 500 mg by mouth daily., Disp: , Rfl:    OVER THE COUNTER MEDICATION, daily. Nature Made Stress Relief, Disp: , Rfl:    polyethylene glycol (MIRALAX / GLYCOLAX) packet, Take 17 g by mouth daily., Disp: , Rfl:    QUERCETIN PO, Take 1,000 mg by mouth daily., Disp: , Rfl:    Turmeric (QC TUMERIC COMPLEX) 500 MG CAPS, Take by mouth daily., Disp: , Rfl:    valACYclovir (VALTREX) 500 MG tablet, Take 1 tablet (500 mg total) by mouth daily., Disp: 90 tablet, Rfl: 3   ZINC-VITAMIN C PO, Take 2 capsules by mouth daily with breakfast., Disp: , Rfl:    EPIPEN 2-PAK 0.3 MG/0.3ML SOAJ injection, , Disp: , Rfl:    Green Tea, Camellia sinensis, (GREEN TEA 600) 600 MG CAPS, Take by  mouth daily. (Patient not taking: Reported on 05/26/2023), Disp: , Rfl:   Allergies:  Allergies  Allergen Reactions   Chlordiazepoxide-Clidinium Other (See Comments)    "loopy"   Codeine Nausea And Vomiting    Dizziness   Penicillins Hives    Past Medical History, Surgical history, Social history, and Family History were reviewed and updated.  Review of Systems: Review of Systems  Constitutional: Negative.   HENT:  Negative.    Eyes: Negative.   Respiratory: Negative.    Cardiovascular: Negative.   Gastrointestinal: Negative.   Endocrine: Negative.   Genitourinary: Negative.    Musculoskeletal: Negative.   Skin: Negative.   Neurological: Negative.   Hematological: Negative.   Psychiatric/Behavioral: Negative.      Physical Exam:  height is 5\' 10"  (1.778 m) and weight is 156 lb 12.8 oz (71.1 kg). Her oral  temperature is 98.2 F (36.8 C). Her blood pressure is 98/53 (abnormal) and her pulse is 55 (abnormal). Her respiration is 18 and oxygen saturation is 100%.   Wt Readings from Last 3 Encounters:  05/26/23 156 lb 12.8 oz (71.1 kg)  05/02/23 156 lb (70.8 kg)  02/22/23 157 lb (71.2 kg)    Physical Exam Vitals reviewed.  HENT:     Head: Normocephalic and atraumatic.  Eyes:     Pupils: Pupils are equal, round, and reactive to light.  Cardiovascular:     Rate and Rhythm: Normal rate and regular rhythm.     Heart sounds: Normal heart sounds.  Pulmonary:     Effort: Pulmonary effort is normal.     Breath sounds: Normal breath sounds.  Abdominal:     General: Bowel sounds are normal.     Palpations: Abdomen is soft.  Musculoskeletal:        General: No tenderness or deformity. Normal range of motion.     Cervical back: Normal range of motion.  Lymphadenopathy:     Cervical: No cervical adenopathy.  Skin:    General: Skin is warm and dry.     Findings: No erythema or rash.  Neurological:     Mental Status: She is alert and oriented to person, place, and time.  Psychiatric:        Behavior: Behavior normal.        Thought Content: Thought content normal.        Judgment: Judgment normal.      Lab Results  Component Value Date   WBC 16.9 (H) 05/26/2023   HGB 14.3 05/26/2023   HCT 43.2 05/26/2023   MCV 96.6 05/26/2023   PLT 264 05/26/2023     Chemistry      Component Value Date/Time   NA 142 05/26/2023 0921   K 3.9 05/26/2023 0921   CL 104 05/26/2023 0921   CO2 31 05/26/2023 0921   BUN 24 (H) 05/26/2023 0921   CREATININE 0.78 05/26/2023 0921      Component Value Date/Time   CALCIUM 9.4 05/26/2023 0921   ALKPHOS 54 05/26/2023 0921   AST 29 05/26/2023 0921   ALT 28 05/26/2023 0921   BILITOT 0.9 05/26/2023 0921      Impression and Plan: [name] is a very charming 62 year old white female.  She has CLL.  I think this is a low risk CLL.  I am so happy that  her white cell count is actually little bit better.  The percentage of lymphocytes is also a little bit better.  For right now, I see no indication that the CLL  is active.  She has CLL.  I am sure she has had it for a few years.  Hopefully, we can go many years before we have to do any treatment.  We will still follow her up in 6 months.    6/14/202410:18 AM

## 2023-05-29 ENCOUNTER — Inpatient Hospital Stay: Payer: Managed Care, Other (non HMO)

## 2023-05-29 ENCOUNTER — Ambulatory Visit: Payer: Managed Care, Other (non HMO) | Admitting: Hematology & Oncology

## 2023-06-13 DIAGNOSIS — J301 Allergic rhinitis due to pollen: Secondary | ICD-10-CM | POA: Diagnosis not present

## 2023-06-13 DIAGNOSIS — J3089 Other allergic rhinitis: Secondary | ICD-10-CM | POA: Diagnosis not present

## 2023-06-13 DIAGNOSIS — J3081 Allergic rhinitis due to animal (cat) (dog) hair and dander: Secondary | ICD-10-CM | POA: Diagnosis not present

## 2023-06-22 ENCOUNTER — Other Ambulatory Visit: Payer: Self-pay | Admitting: Internal Medicine

## 2023-06-26 DIAGNOSIS — I83812 Varicose veins of left lower extremities with pain: Secondary | ICD-10-CM | POA: Diagnosis not present

## 2023-06-27 DIAGNOSIS — N952 Postmenopausal atrophic vaginitis: Secondary | ICD-10-CM | POA: Diagnosis not present

## 2023-06-27 DIAGNOSIS — N941 Unspecified dyspareunia: Secondary | ICD-10-CM | POA: Diagnosis not present

## 2023-06-29 ENCOUNTER — Other Ambulatory Visit: Payer: Self-pay | Admitting: Internal Medicine

## 2023-06-29 DIAGNOSIS — J3081 Allergic rhinitis due to animal (cat) (dog) hair and dander: Secondary | ICD-10-CM | POA: Diagnosis not present

## 2023-06-29 DIAGNOSIS — J3089 Other allergic rhinitis: Secondary | ICD-10-CM | POA: Diagnosis not present

## 2023-06-29 DIAGNOSIS — J301 Allergic rhinitis due to pollen: Secondary | ICD-10-CM | POA: Diagnosis not present

## 2023-06-29 MED ORDER — LINACLOTIDE 145 MCG PO CAPS: 145.0000 ug | ORAL_CAPSULE | Freq: Every day | ORAL | 0 refills | Status: DC

## 2023-06-29 NOTE — Addendum Note (Signed)
Addended by: Richardson Chiquito on: 06/29/2023 03:04 PM   Modules accepted: Orders

## 2023-06-30 ENCOUNTER — Other Ambulatory Visit: Payer: Self-pay

## 2023-06-30 ENCOUNTER — Ambulatory Visit: Payer: BC Managed Care – PPO | Admitting: Physician Assistant

## 2023-06-30 MED ORDER — LINACLOTIDE 145 MCG PO CAPS: 145.0000 ug | ORAL_CAPSULE | Freq: Every day | ORAL | 3 refills | Status: DC

## 2023-06-30 NOTE — Telephone Encounter (Signed)
Linzess refilled per Mike Gip, PA-C and patient was rescheduled to 09-27-23 at 3pm.

## 2023-07-01 ENCOUNTER — Telehealth: Payer: Self-pay | Admitting: Pharmacy Technician

## 2023-07-01 NOTE — Telephone Encounter (Signed)
Pharmacy Patient Advocate Encounter   Received notification from CoverMyMeds that prior authorization for [facility] is required/requested.   Insurance verification completed.   The patient is insured through [facility] .   Per test claim: PA submitted to [employer] via CoverMyMeds Key/confirmation #/EOC [id number] Status is pending

## 2023-07-04 DIAGNOSIS — J3081 Allergic rhinitis due to animal (cat) (dog) hair and dander: Secondary | ICD-10-CM | POA: Diagnosis not present

## 2023-07-04 DIAGNOSIS — J3089 Other allergic rhinitis: Secondary | ICD-10-CM | POA: Diagnosis not present

## 2023-07-04 DIAGNOSIS — J301 Allergic rhinitis due to pollen: Secondary | ICD-10-CM | POA: Diagnosis not present

## 2023-07-04 NOTE — Telephone Encounter (Signed)
[name] (Key: [id number]) PA Case ID #: [id number] Rx #: [id number] Need Help? Call us at [phone] Outcome Approved on July 21 Approved. Authorization Expiration Date: 06/30/2024 Drug Linzess capsules ePA cloud logo Form [employer] [place] [employer] Electronic Request Form Original Claim Info 70

## 2023-07-10 DIAGNOSIS — J301 Allergic rhinitis due to pollen: Secondary | ICD-10-CM | POA: Diagnosis not present

## 2023-07-10 DIAGNOSIS — J3081 Allergic rhinitis due to animal (cat) (dog) hair and dander: Secondary | ICD-10-CM | POA: Diagnosis not present

## 2023-07-10 DIAGNOSIS — J3089 Other allergic rhinitis: Secondary | ICD-10-CM | POA: Diagnosis not present

## 2023-07-17 ENCOUNTER — Encounter: Payer: Self-pay | Admitting: Family Medicine

## 2023-07-17 ENCOUNTER — Encounter: Payer: Self-pay | Admitting: Hematology & Oncology

## 2023-07-17 ENCOUNTER — Ambulatory Visit: Payer: BC Managed Care – PPO | Admitting: Family Medicine

## 2023-07-17 VITALS — BP 94/60 | HR 50 | Temp 97.8°F | Ht 70.0 in | Wt 155.4 lb

## 2023-07-17 DIAGNOSIS — R7303 Prediabetes: Secondary | ICD-10-CM | POA: Diagnosis not present

## 2023-07-17 DIAGNOSIS — Z0000 Encounter for general adult medical examination without abnormal findings: Secondary | ICD-10-CM | POA: Diagnosis not present

## 2023-07-17 DIAGNOSIS — A609 Anogenital herpesviral infection, unspecified: Secondary | ICD-10-CM | POA: Diagnosis not present

## 2023-07-17 DIAGNOSIS — R635 Abnormal weight gain: Secondary | ICD-10-CM

## 2023-07-17 DIAGNOSIS — Z1231 Encounter for screening mammogram for malignant neoplasm of breast: Secondary | ICD-10-CM | POA: Diagnosis not present

## 2023-07-17 DIAGNOSIS — M76899 Other specified enthesopathies of unspecified lower limb, excluding foot: Secondary | ICD-10-CM

## 2023-07-17 DIAGNOSIS — Z13220 Encounter for screening for lipoid disorders: Secondary | ICD-10-CM | POA: Diagnosis not present

## 2023-07-17 DIAGNOSIS — R7309 Other abnormal glucose: Secondary | ICD-10-CM

## 2023-07-17 LAB — URINALYSIS, ROUTINE W REFLEX MICROSCOPIC
Bilirubin Urine: NEGATIVE
Hgb urine dipstick: NEGATIVE
Ketones, ur: NEGATIVE
Nitrite: NEGATIVE
RBC / HPF: NONE SEEN (ref 0–?)
Specific Gravity, Urine: 1.01 (ref 1.000–1.030)
Total Protein, Urine: NEGATIVE
Urine Glucose: NEGATIVE
Urobilinogen, UA: 0.2 (ref 0.0–1.0)
pH: 7.5 (ref 5.0–8.0)

## 2023-07-17 LAB — LIPID PANEL
Cholesterol: 171 mg/dL (ref 0–200)
HDL: 49.7 mg/dL (ref >39.00)
LDL Cholesterol: 105 mg/dL — ABNORMAL HIGH (ref 0–99)
NonHDL: 121.52
Total CHOL/HDL Ratio: 3
Triglycerides: 85 mg/dL (ref 0.0–149.0)
VLDL: 17 mg/dL (ref 0.0–40.0)

## 2023-07-17 LAB — HEMOGLOBIN A1C: Hgb A1c MFr Bld: 5.6 % (ref 4.6–6.5)

## 2023-07-17 MED ORDER — METFORMIN HCL 500 MG PO TABS
500.0000 mg | ORAL_TABLET | Freq: Every day | ORAL | 3 refills | Status: DC
Start: 2023-07-17 — End: 2024-10-02

## 2023-07-17 MED ORDER — VALACYCLOVIR HCL 500 MG PO TABS
500.0000 mg | ORAL_TABLET | Freq: Every day | ORAL | 3 refills | Status: DC
Start: 2023-07-17 — End: 2024-07-08

## 2023-07-17 NOTE — Progress Notes (Signed)
Established Patient Office Visit   Subjective:  Patient ID: [name], female    DOB: November 01, 1961  Age: 62 y.o. MRN: [id number]  Chief Complaint  Patient presents with   Annual Exam    CPE. Pt is fasting. Pt complains of bilateral hip pain. Also vaginal dryness.     HPI Encounter Diagnoses  Name Primary?   Quadriceps tendonitis Yes   HSV (herpes simplex virus) anogenital infection    Prediabetes    Healthcare maintenance    Breast cancer screening by mammogram    Screening for hyperlipidemia    Weight gain    Elevated glucose    For yearly physical.  Doing well.  CLL is stable.  Continues metformin for prediabetes.  Contemplating retirement.  Mom remains in assisted living.  There is family friction regarding this issue.  Patient remains active.  Regular dental care.  Continues follow-up with GYN.  Up-to-date on health maintenance.  Has some pain in her proximal anterior thighs with standing.   Review of Systems  Constitutional: Negative.   HENT: Negative.    Eyes:  Negative for blurred vision, discharge and redness.  Respiratory: Negative.    Cardiovascular: Negative.   Gastrointestinal: Negative.  Negative for abdominal pain, blood in stool, constipation, diarrhea and melena.  Genitourinary: Negative.   Musculoskeletal:  Positive for myalgias. Negative for joint pain.  Skin:  Negative for rash.  Neurological:  Negative for tingling, loss of consciousness and weakness.  Endo/Heme/Allergies:  Negative for polydipsia.     Current Outpatient Medications:    acetaminophen (TYLENOL) 500 MG tablet, Take 1,000 mg by mouth every 6 (six) hours as needed for mild pain., Disp: , Rfl:    ASTAXANTHIN PO, Take 12 mg by mouth daily., Disp: , Rfl:    Azelastine HCl 137 MCG/SPRAY SOLN, Place 1 spray into both nostrils as needed., Disp: , Rfl:    b complex vitamins capsule, Take 1 capsule by mouth daily., Disp: , Rfl:    Calcium Carb-Cholecalciferol 600-800 MG-UNIT TABS, Take 1  tablet by mouth 2 (two) times daily., Disp: , Rfl:    cycloSPORINE (RESTASIS) 0.05 % ophthalmic emulsion, INSTILL 1 DROP INTO EACH EYE TWICE DAILY, Disp: 60 each, Rfl: 0   EPIPEN 2-PAK 0.3 MG/0.3ML SOAJ injection, , Disp: , Rfl:    Green Tea, Camellia sinensis, (GREEN TEA 600) 600 MG CAPS, Take by mouth daily., Disp: , Rfl:    levocetirizine (XYZAL) 5 MG tablet, Take 5 mg by mouth every evening. , Disp: , Rfl:    linaclotide (LINZESS) 145 MCG CAPS capsule, Take 1 capsule (145 mcg total) by mouth daily before breakfast., Disp: 30 capsule, Rfl: 3   Magnesium Citrate 100 MG TABS, Take by mouth daily., Disp: , Rfl:    MELATONIN FAST DISSOLVE PO, Take by mouth at bedtime. 11/25/2022 Takes (2) 15 mg dissolvable tabs., Disp: , Rfl:    methocarbamol (ROBAXIN-750) 750 MG tablet, Take 1 tablet (750 mg total) by mouth every 6 (six) hours as needed for muscle spasms., Disp: 60 tablet, Rfl: 0   Misc Natural Products (JOINT HEALTH PO), Take by mouth daily., Disp: , Rfl:    montelukast (SINGULAIR) 10 MG tablet, Take 1 tablet by mouth every evening., Disp: , Rfl:    Multiple Vitamin (MULTIVITAMINS PO), Take 1 tablet by mouth daily., Disp: , Rfl:    Omega-3 Fatty Acids (FISH OIL PO), Take 500 mg by mouth daily., Disp: , Rfl:    OVER THE COUNTER MEDICATION, daily. Ashby Dawes  Made Stress Relief, Disp: , Rfl:    polyethylene glycol (MIRALAX / GLYCOLAX) packet, Take 17 g by mouth daily., Disp: , Rfl:    QUERCETIN PO, Take 1,000 mg by mouth daily., Disp: , Rfl:    Turmeric (QC TUMERIC COMPLEX) 500 MG CAPS, Take by mouth daily., Disp: , Rfl:    ZINC-VITAMIN C PO, Take 2 capsules by mouth daily with breakfast., Disp: , Rfl:    metFORMIN (GLUCOPHAGE) 500 MG tablet, Take 1 tablet (500 mg total) by mouth daily with breakfast., Disp: 90 tablet, Rfl: 3   valACYclovir (VALTREX) 500 MG tablet, Take 1 tablet (500 mg total) by mouth daily., Disp: 90 tablet, Rfl: 3   Objective:     BP 94/60   Pulse (!) 50   Temp 97.8 F (36.6  C)   Ht 5\' 10"  (1.778 m)   Wt 155 lb 6.4 oz (70.5 kg)   SpO2 99%   BMI 22.30 kg/m  Wt Readings from Last 3 Encounters:  07/17/23 155 lb 6.4 oz (70.5 kg)  05/26/23 156 lb 12.8 oz (71.1 kg)  05/02/23 156 lb (70.8 kg)      Physical Exam Constitutional:      General: She is not in acute distress.    Appearance: Normal appearance. She is not ill-appearing, toxic-appearing or diaphoretic.  HENT:     Head: Normocephalic and atraumatic.     Right Ear: External ear normal.     Left Ear: External ear normal.     Mouth/Throat:     Mouth: Mucous membranes are moist.     Pharynx: Oropharynx is clear. No oropharyngeal exudate or posterior oropharyngeal erythema.  Eyes:     General: No scleral icterus.       Right eye: No discharge.        Left eye: No discharge.     Extraocular Movements: Extraocular movements intact.     Conjunctiva/sclera: Conjunctivae normal.     Pupils: Pupils are equal, round, and reactive to light.  Cardiovascular:     Rate and Rhythm: Normal rate and regular rhythm.  Pulmonary:     Effort: Pulmonary effort is normal. No respiratory distress.     Breath sounds: Normal breath sounds.  Abdominal:     General: Bowel sounds are normal.  Musculoskeletal:     Cervical back: No rigidity or tenderness.     Right hip: Normal range of motion. Normal strength.     Left hip: Normal range of motion. Normal strength.  Lymphadenopathy:     Cervical: No cervical adenopathy.  Skin:    General: Skin is warm and dry.  Neurological:     Mental Status: She is alert and oriented to person, place, and time.  Psychiatric:        Mood and Affect: Mood normal.        Behavior: Behavior normal.      No results found for any visits on 07/17/23.    The 10-year ASCVD risk score (Arnett DK, et al., 2019) is: 2.3%    Assessment & Plan:   Quadriceps tendonitis -     Ambulatory referral to Sports Medicine  HSV (herpes simplex virus) anogenital infection -     valACYclovir  HCl; Take 1 tablet (500 mg total) by mouth daily.  Dispense: 90 tablet; Refill: 3  Prediabetes -     Hemoglobin A1c  Healthcare maintenance -     Urinalysis, Routine w reflex microscopic  Breast cancer screening by mammogram -  Digital Screening Mammogram, Left and Right; Future  Screening for hyperlipidemia -     Lipid panel  Weight gain -     metFORMIN HCl; Take 1 tablet (500 mg total) by mouth daily with breakfast.  Dispense: 90 tablet; Refill: 3  Elevated glucose -     metFORMIN HCl; Take 1 tablet (500 mg total) by mouth daily with breakfast.  Dispense: 90 tablet; Refill: 3    Return in about 1 year (around 07/16/2024), or if symptoms worsen or fail to improve.  Continue healthy active lifestyle.  Continue metformin.  Information given on health maintenance and disease prevention.  Continue follow-up with hematology.  Upcoming appointment with GYN.  Mliss Sax, MD

## 2023-07-18 ENCOUNTER — Encounter: Payer: Self-pay | Admitting: Family Medicine

## 2023-07-18 DIAGNOSIS — J3081 Allergic rhinitis due to animal (cat) (dog) hair and dander: Secondary | ICD-10-CM | POA: Diagnosis not present

## 2023-07-18 DIAGNOSIS — J301 Allergic rhinitis due to pollen: Secondary | ICD-10-CM | POA: Diagnosis not present

## 2023-07-18 DIAGNOSIS — J3089 Other allergic rhinitis: Secondary | ICD-10-CM | POA: Diagnosis not present

## 2023-07-19 ENCOUNTER — Encounter: Payer: Self-pay | Admitting: Hematology & Oncology

## 2023-07-24 DIAGNOSIS — J3081 Allergic rhinitis due to animal (cat) (dog) hair and dander: Secondary | ICD-10-CM | POA: Diagnosis not present

## 2023-07-24 DIAGNOSIS — J301 Allergic rhinitis due to pollen: Secondary | ICD-10-CM | POA: Diagnosis not present

## 2023-07-24 DIAGNOSIS — J3089 Other allergic rhinitis: Secondary | ICD-10-CM | POA: Diagnosis not present

## 2023-08-01 ENCOUNTER — Encounter: Payer: Self-pay | Admitting: Hematology & Oncology

## 2023-08-01 ENCOUNTER — Ambulatory Visit: Payer: BC Managed Care – PPO | Admitting: Sports Medicine

## 2023-08-01 VITALS — BP 98/62 | Ht 70.0 in | Wt 154.0 lb

## 2023-08-01 DIAGNOSIS — J3089 Other allergic rhinitis: Secondary | ICD-10-CM | POA: Diagnosis not present

## 2023-08-01 DIAGNOSIS — M25552 Pain in left hip: Secondary | ICD-10-CM

## 2023-08-01 DIAGNOSIS — J301 Allergic rhinitis due to pollen: Secondary | ICD-10-CM | POA: Diagnosis not present

## 2023-08-01 DIAGNOSIS — M25551 Pain in right hip: Secondary | ICD-10-CM

## 2023-08-01 DIAGNOSIS — J3081 Allergic rhinitis due to animal (cat) (dog) hair and dander: Secondary | ICD-10-CM | POA: Diagnosis not present

## 2023-08-01 NOTE — Progress Notes (Signed)
   Subjective:    Patient ID: SHANQUETTA ORMOND, female    DOB: 12/08/1961, 62 y.o.   MRN: [id number]  HPI chief complaint: Bilateral hip pain  Patient is a very pleasant 62 year old female that presents today with several weeks of anterior bilateral hip pain.  No trauma.  Her symptoms are most noticeable when going from a seated to standing position.  Pain localizes in the anterior hip.  It does seem to improve with ambulation as well as with occasional Tylenol.  X-rays of her hips done in 2020 showed no significant arthritic change.    Review of Systems As above    Objective:   Physical Exam  Well-developed, well-nourished.  No acute distress  Examination of both hips show good hip range of motion bilaterally.  There is some tenderness to palpation along the anterior hip flexors.  Weakness with resisted hip abduction.  Difficulty performing a sit to stand.  Neurovascularly intact distally.  X-rays from 2020 of both hips are as above      Assessment & Plan:   Bilateral hip pain secondary to hip and quad weakness  We discussed home exercise program versus formal physical therapy.  Patient would like to start with home exercises focusing on quad and hip strengthening.  Follow-up with me again in 4 weeks for reevaluation.  If she does not notice any initial improvement, then reconsider physical therapy at that time.  No need for repeat imaging today.  This note was dictated using Dragon naturally speaking software and may contain errors in syntax, spelling, or content which have not been identified prior to signing this note.

## 2023-08-08 DIAGNOSIS — J3089 Other allergic rhinitis: Secondary | ICD-10-CM | POA: Diagnosis not present

## 2023-08-15 DIAGNOSIS — J3081 Allergic rhinitis due to animal (cat) (dog) hair and dander: Secondary | ICD-10-CM | POA: Diagnosis not present

## 2023-08-15 DIAGNOSIS — J3089 Other allergic rhinitis: Secondary | ICD-10-CM | POA: Diagnosis not present

## 2023-08-15 DIAGNOSIS — J301 Allergic rhinitis due to pollen: Secondary | ICD-10-CM | POA: Diagnosis not present

## 2023-08-22 DIAGNOSIS — C9110 Chronic lymphocytic leukemia of B-cell type not having achieved remission: Secondary | ICD-10-CM | POA: Diagnosis not present

## 2023-08-22 DIAGNOSIS — Z8774 Personal history of (corrected) congenital malformations of heart and circulatory system: Secondary | ICD-10-CM | POA: Diagnosis not present

## 2023-08-22 DIAGNOSIS — R002 Palpitations: Secondary | ICD-10-CM | POA: Diagnosis not present

## 2023-08-22 DIAGNOSIS — Z87891 Personal history of nicotine dependence: Secondary | ICD-10-CM | POA: Diagnosis not present

## 2023-08-22 DIAGNOSIS — Z23 Encounter for immunization: Secondary | ICD-10-CM | POA: Diagnosis not present

## 2023-08-23 DIAGNOSIS — J3089 Other allergic rhinitis: Secondary | ICD-10-CM | POA: Diagnosis not present

## 2023-08-23 DIAGNOSIS — J3081 Allergic rhinitis due to animal (cat) (dog) hair and dander: Secondary | ICD-10-CM | POA: Diagnosis not present

## 2023-08-23 DIAGNOSIS — J301 Allergic rhinitis due to pollen: Secondary | ICD-10-CM | POA: Diagnosis not present

## 2023-08-25 DIAGNOSIS — I83812 Varicose veins of left lower extremities with pain: Secondary | ICD-10-CM | POA: Diagnosis not present

## 2023-08-27 ENCOUNTER — Encounter: Payer: Self-pay | Admitting: Cardiology

## 2023-08-29 ENCOUNTER — Ambulatory Visit: Payer: BC Managed Care – PPO | Admitting: Sports Medicine

## 2023-08-29 VITALS — BP 100/60 | Ht 70.5 in | Wt 152.0 lb

## 2023-08-29 DIAGNOSIS — M25551 Pain in right hip: Secondary | ICD-10-CM

## 2023-08-29 DIAGNOSIS — M25552 Pain in left hip: Secondary | ICD-10-CM

## 2023-08-29 NOTE — Progress Notes (Signed)
Subjective:    Patient ID: [name], female    DOB: 1961/08/18, 62 y.o.   MRN: [id number]  HPI  [name] presents today for follow-up on bilateral hip pain.  Pain has improved quite a bit with her home exercises.   Review of Systems As above    Objective:   Physical Exam   Well-developed, well-nourished.  No acute distress  Examination of both hips shows smooth painless hip range of motion.  She has much better hip abductor strength today than on her exam 4 weeks ago.     Assessment & Plan:   Improved bilateral hip pain secondary to muscle weakness  [name] understands the importance of lifelong strengthening of her core and lower body.  She and her husband both go to the gym and she has started incorporating lower body exercises into her workouts.  Given her improvement, I do not believe she needs any formal physical therapy.  I will discharge her to follow-up as needed.  This note was dictated using Dragon naturally speaking software and may contain errors in syntax, spelling, or content which have not been identified prior to signing this note.

## 2023-08-30 DIAGNOSIS — J3089 Other allergic rhinitis: Secondary | ICD-10-CM | POA: Diagnosis not present

## 2023-08-30 DIAGNOSIS — J3081 Allergic rhinitis due to animal (cat) (dog) hair and dander: Secondary | ICD-10-CM | POA: Diagnosis not present

## 2023-08-30 DIAGNOSIS — J301 Allergic rhinitis due to pollen: Secondary | ICD-10-CM | POA: Diagnosis not present

## 2023-09-01 DIAGNOSIS — C9110 Chronic lymphocytic leukemia of B-cell type not having achieved remission: Secondary | ICD-10-CM | POA: Diagnosis not present

## 2023-09-04 ENCOUNTER — Telehealth: Payer: Self-pay | Admitting: Cardiology

## 2023-09-04 DIAGNOSIS — C9110 Chronic lymphocytic leukemia of B-cell type not having achieved remission: Secondary | ICD-10-CM | POA: Diagnosis not present

## 2023-09-04 NOTE — Telephone Encounter (Signed)
Patient c/o Palpitations:  STAT if patient reporting lightheadedness, shortness of breath, or chest pain  How long have you had palpitations/irregular HR/ Afib? Are you having the symptoms now? The past month I have had the flutters twice when I was laying down getting ready to go to sleep.  Are you currently experiencing lightheadedness, SOB or CP?  sometimes have to take deep breaths. But I'm not short on breath I actually exercise four days a week and sometimes I run ,walk about a mile or two.   Do you have a history of afib (atrial fibrillation) or irregular heart rhythm? I have a atrial septal defect repair. But I don't have a history of a fib.  Have you checked your BP or HR? (document readings if available): My BPM is 65 according to my Apple Watch. I have a history of low blood pressure and last time I went to the doctor it was 100/65 last week.   Are you experiencing any other symptoms?  I am not experiencing other symptoms.    Answers received via patient schedule. Please advise.

## 2023-09-04 NOTE — Telephone Encounter (Signed)
Patient's states husband is concerned and would like her to have it checked out.   Patient states it has only occurred couple of times  During the time the episode occurred she was not wearing her Apple watch.  RN  informed patient if possible try to capture the rhythm on the watch if possible   Patient verbalized understanding.

## 2023-09-07 ENCOUNTER — Ambulatory Visit: Payer: BC Managed Care – PPO | Admitting: Nurse Practitioner

## 2023-09-07 VITALS — BP 110/64 | HR 78 | Temp 98.1°F | Wt 160.6 lb

## 2023-09-07 DIAGNOSIS — H6123 Impacted cerumen, bilateral: Secondary | ICD-10-CM | POA: Diagnosis not present

## 2023-09-07 DIAGNOSIS — J069 Acute upper respiratory infection, unspecified: Secondary | ICD-10-CM | POA: Diagnosis not present

## 2023-09-07 LAB — POCT INFLUENZA A/B
Influenza A, POC: NEGATIVE
Influenza B, POC: NEGATIVE

## 2023-09-07 LAB — POC COVID19 BINAXNOW: SARS Coronavirus 2 Ag: NEGATIVE

## 2023-09-07 MED ORDER — FLUTICASONE PROPIONATE 50 MCG/ACT NA SUSP
2.0000 | Freq: Every day | NASAL | 0 refills | Status: AC
Start: 2023-09-07 — End: ?

## 2023-09-07 NOTE — Progress Notes (Signed)
Acute Office Visit  Subjective:    Patient ID: [name], female    DOB: 07-13-1961, 62 y.o.   MRN: [id number]  Chief Complaint  Patient presents with   Ear Fullness    Ears stopped up, head congestion, diarrhea   URI  This is a new problem. The current episode started yesterday. The problem has been unchanged. There has been no fever. Associated symptoms include congestion, ear pain, headaches, a plugged ear sensation and sinus pain. Pertinent negatives include no abdominal pain, chest pain, coughing, diarrhea, dysuria, joint pain, joint swelling, nausea, neck pain, rash, rhinorrhea, sneezing, sore throat, swollen glands, vomiting or wheezing. She has tried decongestant for the symptoms. The treatment provided no relief.   Outpatient Medications Prior to Visit  Medication Sig   acetaminophen (TYLENOL) 500 MG tablet Take 1,000 mg by mouth every 6 (six) hours as needed for mild pain.   ASTAXANTHIN PO Take 12 mg by mouth daily.   Azelastine HCl 137 MCG/SPRAY SOLN Place 1 spray into both nostrils as needed.   b complex vitamins capsule Take 1 capsule by mouth daily.   Calcium Carb-Cholecalciferol 600-800 MG-UNIT TABS Take 1 tablet by mouth 2 (two) times daily.   cycloSPORINE (RESTASIS) 0.05 % ophthalmic emulsion INSTILL 1 DROP INTO EACH EYE TWICE DAILY   EPIPEN 2-PAK 0.3 MG/0.3ML SOAJ injection    Green Tea, Camellia sinensis, (GREEN TEA 600) 600 MG CAPS Take by mouth daily.   levocetirizine (XYZAL) 5 MG tablet Take 5 mg by mouth every evening.    linaclotide (LINZESS) 145 MCG CAPS capsule Take 1 capsule (145 mcg total) by mouth daily before breakfast.   Magnesium Citrate 100 MG TABS Take by mouth daily.   MELATONIN FAST DISSOLVE PO Take by mouth at bedtime. 11/25/2022 Takes (2) 15 mg dissolvable tabs.   metFORMIN (GLUCOPHAGE) 500 MG tablet Take 1 tablet (500 mg total) by mouth daily with breakfast.   methocarbamol (ROBAXIN-750) 750 MG tablet Take 1 tablet (750 mg total) by  mouth every 6 (six) hours as needed for muscle spasms.   Misc Natural Products (JOINT HEALTH PO) Take by mouth daily.   montelukast (SINGULAIR) 10 MG tablet Take 1 tablet by mouth every evening.   Multiple Vitamin (MULTIVITAMINS PO) Take 1 tablet by mouth daily.   Omega-3 Fatty Acids (FISH OIL PO) Take 500 mg by mouth daily.   OVER THE COUNTER MEDICATION daily. Nature Made Stress Relief   polyethylene glycol (MIRALAX / GLYCOLAX) packet Take 17 g by mouth daily.   QUERCETIN PO Take 1,000 mg by mouth daily.   Turmeric (QC TUMERIC COMPLEX) 500 MG CAPS Take by mouth daily.   valACYclovir (VALTREX) 500 MG tablet Take 1 tablet (500 mg total) by mouth daily.   ZINC-VITAMIN C PO Take 2 capsules by mouth daily with breakfast.   estradiol (ESTRACE) 0.1 MG/GM vaginal cream Place 1 g vaginally 2 (two) times a week. (Patient not taking: Reported on 09/07/2023)   No facility-administered medications prior to visit.    Reviewed past medical and social history.  Review of Systems  HENT:  Positive for congestion, ear pain and sinus pain. Negative for rhinorrhea, sneezing and sore throat.   Respiratory:  Negative for cough and wheezing.   Cardiovascular:  Negative for chest pain.  Gastrointestinal:  Negative for abdominal pain, diarrhea, nausea and vomiting.  Genitourinary:  Negative for dysuria.  Musculoskeletal:  Negative for joint pain and neck pain.  Skin:  Negative for rash.  Neurological:  Positive  for headaches.   Per HPI     Objective:    Physical Exam Constitutional:      General: She is not in acute distress. HENT:     Right Ear: External ear normal. There is impacted cerumen.     Left Ear: External ear normal. There is impacted cerumen.     Nose: Mucosal edema, congestion and rhinorrhea present. No nasal tenderness.     Right Nostril: No occlusion.     Left Nostril: No occlusion.     Right Turbinates: Swollen. Not enlarged or pale.     Left Turbinates: Swollen. Not enlarged or pale.      Right Sinus: Maxillary sinus tenderness present. No frontal sinus tenderness.     Left Sinus: Maxillary sinus tenderness present. No frontal sinus tenderness.     Comments: Unsuccessful ear lavage    Mouth/Throat:     Pharynx: Oropharynx is clear. Uvula midline.     Tonsils: No tonsillar exudate or tonsillar abscesses.  Eyes:     Extraocular Movements: Extraocular movements intact.     Conjunctiva/sclera: Conjunctivae normal.  Cardiovascular:     Rate and Rhythm: Normal rate and regular rhythm.     Pulses: Normal pulses.     Heart sounds: Normal heart sounds.  Pulmonary:     Effort: Pulmonary effort is normal.     Breath sounds: Normal breath sounds.  Musculoskeletal:     Cervical back: Normal range of motion and neck supple.  Lymphadenopathy:     Cervical: No cervical adenopathy.  Neurological:     Mental Status: She is alert and oriented to person, place, and time.    BP 110/64 (BP Location: Right Arm)   Pulse 78   Temp 98.1 F (36.7 C)   Wt 160 lb 9.6 oz (72.8 kg)   SpO2 98%   BMI 22.72 kg/m    No results found for any visits on 09/07/23. Procedure Note :   Procedure :  Ear irrigation Indication:  Bilateral Cerumen impaction Risks, including pain, dizziness, eardrum perforation, bleeding, infection and others as well as benefits were explained to the patient in detail. Verbal consent was obtained and the patient agreed to proceed.   We used "The Elephant Ear Irrigation Device" filled with lukewarm water for irrigation. Unable to dislodge any ear wax with water and ear lope  Tolerated well. Complications: None. Advised to use debrox ear drop and return to office for another attempt.    Assessment & Plan:   Problem List Items Addressed This Visit     Upper respiratory infection - Primary   Relevant Medications   fluticasone (FLONASE) 50 MCG/ACT nasal spray   Other Relevant Orders   POC COVID-19   POCT Influenza A/B   Other Visit Diagnoses     Bilateral  hearing loss due to cerumen impaction          Meds ordered this encounter  Medications   fluticasone (FLONASE) 50 MCG/ACT nasal spray    Sig: Place 2 sprays into both nostrils daily.    Dispense:  16 g    Refill:  0    Order Specific Question:   Supervising Provider    Answer:   Mliss Sax [[id number]]   Return if symptoms worsen or fail to improve.  Alysia Penna, NP

## 2023-09-07 NOTE — Patient Instructions (Addendum)
URI Instructions: Flonase and Afrin use: apply 1spray of afrin in each nare, wait , then apply 2sprays of flonase in each nare. Use both nasal spray consecutively x 3days, then flonase only for at least 14days.  Avoid decongestants if you have high blood pressure. Encourage adequate oral hydration. Use over-the-counter  cold" medicine  such as Dayquil/nyquil for cough and congestion.  Use mucinex DM or Robitussin  or delsym for cough without congestion  You can use plain "Tylenol" or "Advil" for fever, chills and achyness. Use cool mist humidifier at bedtime to help with nasal congestion and cough.  Cold/cough medications may have tylenol or ibuprofen or guaifenesin or dextromethophan in them, so be careful not to take beyond the recommended dose for each of these medications.   "Common cold" symptoms are usually triggered by a virus.  The antibiotics are usually not necessary. On average, a" viral cold" illness may take 7-10 days to resolve. Please, make an appointment if you are not better or if you're worse.

## 2023-09-11 DIAGNOSIS — C9110 Chronic lymphocytic leukemia of B-cell type not having achieved remission: Secondary | ICD-10-CM | POA: Diagnosis not present

## 2023-09-13 ENCOUNTER — Ambulatory Visit: Payer: BC Managed Care – PPO | Attending: Cardiology | Admitting: Cardiology

## 2023-09-13 ENCOUNTER — Encounter: Payer: Self-pay | Admitting: Cardiology

## 2023-09-13 VITALS — BP 108/60 | HR 65 | Ht 70.5 in | Wt 157.2 lb

## 2023-09-13 DIAGNOSIS — I499 Cardiac arrhythmia, unspecified: Secondary | ICD-10-CM

## 2023-09-13 DIAGNOSIS — I951 Orthostatic hypotension: Secondary | ICD-10-CM

## 2023-09-13 DIAGNOSIS — R7303 Prediabetes: Secondary | ICD-10-CM

## 2023-09-13 DIAGNOSIS — Q2111 Secundum atrial septal defect: Secondary | ICD-10-CM | POA: Diagnosis not present

## 2023-09-13 DIAGNOSIS — R002 Palpitations: Secondary | ICD-10-CM | POA: Diagnosis not present

## 2023-09-13 DIAGNOSIS — I8390 Asymptomatic varicose veins of unspecified lower extremity: Secondary | ICD-10-CM

## 2023-09-13 DIAGNOSIS — I4729 Other ventricular tachycardia: Secondary | ICD-10-CM

## 2023-09-13 NOTE — Patient Instructions (Addendum)
Medication Instructions:   No changes  *If you need a refill on your cardiac medications before your next appointment, please call your pharmacy*  Other Instructions   Hydrate , hydrate   Monitor palp. With your Apple Watch, if become more frequent meaning occurring at least twice week or more. Contact office.   We will schedule a   holter monitor for to wear  ( if you wear monitor will follow up with an appointment)    Lab Work:  If you have labs (blood work) drawn today and your tests are completely normal, you will receive your results only by: MyChart Message (if you have MyChart) OR A paper copy in the mail If you have any lab test that is abnormal or we need to change your treatment, we will call you to review the results.   Testing/Procedures:    Follow-Up: At [facility], you and your health needs are our priority.  As part of our continuing mission to provide you with exceptional heart care, we have created designated Provider Care Teams.  These Care Teams include your primary Cardiologist (physician) and Advanced Practice Providers (APPs -  Physician Assistants and Nurse Practitioners) who all work together to provide you with the care you need, when you need it.     Your next appointment:   4 month(s)  The format for your next appointment:   In Person  Provider:   Juanda Crumble, PA-C    Then, Bryan Lemma, MD will plan to see you again in 12 month(s).   Other Instructions   Hydrate , hydrate   Monitor palp. With your Apple Watch, if become more frequent meaning occurring at least twice week or more. Contact office.   We will schedule a   holter monitor for to wear

## 2023-09-13 NOTE — Progress Notes (Unsigned)
Cardiology Office Note:  .   Date:  09/18/2023  ID:  [name], DOB 1961-07-08, MRN [id number] PCP: Candace Sax, MD  [facility] Providers Cardiologist:  Candace Lemma, MD     No chief complaint on file.   Patient Profile: .     [name] is a  62 y.o. female  with a PMH below who presents here for 6 month f/u at the request of [name],*.  atrial septal defect s/p repair in 1989, varicose veins status post SSV ablation, asymptomatic bradycardia, vasovagal syncope and CLL. Fam hx of CAD: sister CAD in her 67s (unhealthy lifestyle)   [name] was last seen in March 2024 -> doing well.  No further presyncope/syncope.   She states she was diagnosed with CLL last year.  She is currently under the watch and wait status with a good prognosis.  She has a 13 Q mutation per pt.  She states there is a [facility] team has found that exercise can help CLL patients.  She is working out 3-4 times per week and starting to incorporate running.  With exercise, she denies chest pain, shortness of breath and lightheadedness.  She denies significant palpitations and LE edema.  She is doing weight watchers and has lost 25 pounds since starting it.  She plans to retire in the next 1 to 2 years.  She works in [employer] for [employer].  [place].  Planned Echo f/u in 1 yr.    Subjective   INTERVAL HPI Discussed the use of AI scribe software for clinical note transcription with the patient, who gave verbal consent to proceed.  History of Present Illness   [name] has a history of atrial septal defect (ASD) repair in 1989 (recent Echo 09/2023 reviewed below), presents with intermittent heart flutters and lightheadedness. The episodes are brief, lasting about ten seconds, and occur approximately once or twice a month. The patient reports feeling her heart racing during these episodes of lightheadedness. She also experiences heart flutters when  lying down but does not feel lightheaded during these episodes.  The patient has a family history of atrial fibrillation (AFib - Sister). She is currently on metformin for prediabetes and exercises four times a week without any chest pain or pressure.      ROS:  Cardiovascular ROS: no chest pain or dyspnea on exertion positive for - irregular heartbeat, palpitations, rapid heart rate, and as noted above-associated lightheadedness and dizziness negative for - edema, orthopnea, paroxysmal nocturnal dyspnea, shortness of breath, or , syncope or near syncope, TIA. Review of Systems - Negative except symptoms noted above     Objective   Studies Reviewed: [name]   EKG Interpretation Date/Time:  Wednesday September 13 2023 12:12:28 EDT Ventricular Rate:  65 PR Interval:  190 QRS Duration:  90 QT Interval:  434 QTC Calculation: 451 R Axis:   34  Text Interpretation: Normal sinus rhythm Normal ECG When compared with ECG of 07-Nov-2019 05:25, PREVIOUS ECG IS PRESENT Confirmed by [name] ([id number]) on 09/13/2023 12:55:41 PM    Results LABS Total cholesterol: 171 mg/dL (62/1308) Triglycerides: 85 mg/dL (65/7846) HDL: 49 mg/dL (96/2952) LDL: 841 mg/dL (32/4401) U2V: 2.5% (36/6440) Hemoglobin: 14.3 g/dL (34/7425) Creatinine: 9.56 mg/dL (38/7564) Potassium: 3.9 mmol/L (07/2023) TSH: normal (03/2022)  DIAGNOSTIC Stress test: normal (2020) Echocardiogram: Normal pump function (EF 60-65%), no valve disease, mildly dilated left atrium (2020)   Risk Assessment/Calculations:  Physical Exam:   VS:  BP 108/60 (BP Location: Left Arm, Patient Position: Sitting, Cuff Size: Normal)   Pulse 65   Ht 5' 10.5" (1.791 m)   Wt 157 lb 3.2 oz (71.3 kg)   SpO2 97%   BMI 22.24 kg/m    Wt Readings from Last 3 Encounters:  09/13/23 157 lb 3.2 oz (71.3 kg)  09/07/23 160 lb 9.6 oz (72.8 kg)  08/29/23 152 lb (68.9 kg)    GEN: Well nourished, well developed in no acute distress; healthy  appearing, well groomed.  NECK: No JVD; No carotid bruits CARDIAC: Normal S1, S2; RRR, no murmurs, rubs, gallops RESPIRATORY:  Clear to auscultation without rales, wheezing or rhonchi ; nonlabored, good air movement. ABDOMEN: Soft, non-tender, non-distended EXTREMITIES:  No edema; No deformity      ASSESSMENT AND PLAN: .    Problem List Items Addressed This Visit       Cardiology Problems   ATRIAL SEPTAL DEFECT - Status Post Repair (Chronic)    ASD Repair (1989) No changes or complications since repair. Scheduled for echocardiogram in March 2024 to assess structural integrity. -Continue with scheduled echocardiogram.      Hypotension (Chronic)    Borderline blood pressure today, but not hypotensive.  Would not tolerate much in the way of any significant medications.  Holding off on beta-blocker. . Adequate hydration, avoid dehydration.      Nonsustained ventricular tachycardia    Evaluated with echocardiogram and Myoview relatively normal.  Nonischemic Myoview.  1 short run on monitor.  Not symptomatic.      VARICOSE VEINS, LOWER EXTREMITIES (Chronic)    She is status post left SSV ablation.  Left GSV looked okay. Seems to be okay, continue foot elevation and support stockings.        Other   Palpitations - Primary (Chronic)    Infrequent episodes of palpitations, described as "heart flutters" and "skipping, flipping" beats, lasting about 10 seconds. Episodes associated with transient lightheadedness. No syncope. Family history of atrial fibrillation. Normal EKG today. -Use Apple Watch to monitor and record episodes. -If frequency increases, consider ambulatory EKG monitoring. -Ensure adequate hydration.      Prediabetes (Chronic)    On Metformin. Last A1C was 5.6, below prediabetes cutoff. -Continue Metformin.      Other Visit Diagnoses     Irregular heart beat       Relevant Orders   EKG 12-Lead (Completed)          Follow-up: Return in about 5 months  (around 02/11/2024) for Alternate 5-6 month follow-up with APP & MD. => Scheduled to see [name] after March 2024 echocardiogram. Plan for annual follow-up with MD & then would follow annually thereafter unless issues arise.   Total time spent: 20 min spent with patient + 16 min spent charting = 36 min      Signed, Marykay Lex, MD, MS [name], M.D., M.S. Interventional Cardiologist  [facility]  Pager # [phone] Phone # [phone] [address] [place], [place] [address]

## 2023-09-14 DIAGNOSIS — J301 Allergic rhinitis due to pollen: Secondary | ICD-10-CM | POA: Diagnosis not present

## 2023-09-14 DIAGNOSIS — J3089 Other allergic rhinitis: Secondary | ICD-10-CM | POA: Diagnosis not present

## 2023-09-14 DIAGNOSIS — J3081 Allergic rhinitis due to animal (cat) (dog) hair and dander: Secondary | ICD-10-CM | POA: Diagnosis not present

## 2023-09-18 DIAGNOSIS — R052 Subacute cough: Secondary | ICD-10-CM | POA: Diagnosis not present

## 2023-09-18 DIAGNOSIS — J3089 Other allergic rhinitis: Secondary | ICD-10-CM | POA: Diagnosis not present

## 2023-09-18 DIAGNOSIS — J301 Allergic rhinitis due to pollen: Secondary | ICD-10-CM | POA: Diagnosis not present

## 2023-09-18 DIAGNOSIS — H1045 Other chronic allergic conjunctivitis: Secondary | ICD-10-CM | POA: Diagnosis not present

## 2023-09-18 DIAGNOSIS — J3081 Allergic rhinitis due to animal (cat) (dog) hair and dander: Secondary | ICD-10-CM | POA: Diagnosis not present

## 2023-09-18 NOTE — Assessment & Plan Note (Signed)
On Metformin. Last A1C was 5.6, below prediabetes cutoff. -Continue Metformin.

## 2023-09-18 NOTE — Assessment & Plan Note (Signed)
Evaluated with echocardiogram and Myoview relatively normal.  Nonischemic Myoview.  1 short run on monitor.  Not symptomatic.

## 2023-09-18 NOTE — Assessment & Plan Note (Signed)
Infrequent episodes of palpitations, described as "heart flutters" and "skipping, flipping" beats, lasting about 10 seconds. Episodes associated with transient lightheadedness. No syncope. Family history of atrial fibrillation. Normal EKG today. -Use Apple Watch to monitor and record episodes. -If frequency increases, consider ambulatory EKG monitoring. -Ensure adequate hydration.

## 2023-09-18 NOTE — Assessment & Plan Note (Addendum)
Borderline blood pressure today, but not hypotensive.  Would not tolerate much in the way of any significant medications.  Holding off on beta-blocker. . Adequate hydration, avoid dehydration.

## 2023-09-18 NOTE — Assessment & Plan Note (Signed)
She is status post left SSV ablation.  Left GSV looked okay. Seems to be okay, continue foot elevation and support stockings.

## 2023-09-18 NOTE — Assessment & Plan Note (Signed)
ASD Repair (1989) No changes or complications since repair. Scheduled for echocardiogram in March 2024 to assess structural integrity. -Continue with scheduled echocardiogram.

## 2023-09-27 ENCOUNTER — Ambulatory Visit: Payer: BC Managed Care – PPO | Admitting: Physician Assistant

## 2023-09-27 ENCOUNTER — Encounter: Payer: Self-pay | Admitting: Physician Assistant

## 2023-09-27 VITALS — BP 90/70 | HR 69 | Ht 70.5 in | Wt 160.2 lb

## 2023-09-27 DIAGNOSIS — Z8719 Personal history of other diseases of the digestive system: Secondary | ICD-10-CM | POA: Diagnosis not present

## 2023-09-27 DIAGNOSIS — K581 Irritable bowel syndrome with constipation: Secondary | ICD-10-CM | POA: Diagnosis not present

## 2023-09-27 NOTE — Patient Instructions (Addendum)
Please let us know how the Linzess 290 mcg works and if you would like a prescription. You can [employer] or call the office and ask for Amy's nurse Beth.  Please follow low gas diet.   Continue  Miralax.  Start Benefiber  2 teaspoons in 8 ounces of liquid daily and may increase to twice daily if tolerated.   Avoid chewing gum.  _______________________________________________________  If your blood pressure at your visit was 140/90 or greater, please contact your primary care physician to follow up on this.  _______________________________________________________  If you are age 51 or older, your body mass index should be between 23-30. Your Body mass index is 22.67 kg/m. If this is out of the aforementioned range listed, please consider follow up with your Primary Care Provider.  If you are age 19 or younger, your body mass index should be between 19-25. Your Body mass index is 22.67 kg/m. If this is out of the aformentioned range listed, please consider follow up with your Primary Care Provider.   ________________________________________________________  The [facility] providers would like to encourage you to use [facility] to communicate with providers for non-urgent requests or questions.  Due to long hold times on the telephone, sending your provider a message by [facility] may be a faster and more efficient way to get a response.  Please allow 48 business hours for a response.  Please remember that this is for non-urgent requests.    It was a pleasure to see you today!  Thank you for trusting me with your gastrointestinal care!

## 2023-09-29 DIAGNOSIS — J301 Allergic rhinitis due to pollen: Secondary | ICD-10-CM | POA: Diagnosis not present

## 2023-09-29 DIAGNOSIS — J3089 Other allergic rhinitis: Secondary | ICD-10-CM | POA: Diagnosis not present

## 2023-09-29 DIAGNOSIS — J3081 Allergic rhinitis due to animal (cat) (dog) hair and dander: Secondary | ICD-10-CM | POA: Diagnosis not present

## 2023-10-03 ENCOUNTER — Encounter: Payer: Self-pay | Admitting: Physician Assistant

## 2023-10-03 NOTE — Progress Notes (Signed)
Subjective:    Patient ID: [name], female    DOB: [birth year], 62 y.o.   MRN: [id number]  HPI [name] is a pleasant 62 year old female, established with Dr. Rhea Belton who comes in today for medication refill.  She was last seen in the fall 2022.  She does have history of radiation esophagitis noted on EGD in March 2022, she was treated with PPI therapy at that time, biopsies negative for H. pylori.  She had Bravo done which did show evidence of significant acid reflux, and DeMeester score of 17.6.  After that she had been evaluated by Dr. Barron Alvine for discussion of TIF. Per notes she altered her diet, started eating healthier, and smaller portions and was able to titrate herself off of omeprazole.  She says she continues to do well and does not have any current symptoms of heartburn or indigestion. She is also been followed for constipation.  She had been on Linzess 145 mcg daily over the past couple of years. She says currently she is usually taking a dose of MiraLAX every day in addition to the Linzess, and Senokot 1-2 at bedtime.  This usually will produce a bowel movement every couple of days.  He has no current complaints of abdominal pain, usually feels better if she has bowel movement every day, no complaints of melena or hematochezia. She did have colonoscopy done in 2020, no polyps found was noted to have multiple sigmoid diverticuli. She has not been tried on any other prescription medications for constipation or higher dose Linzess.  She does have history of nonsustained V. tach, ASD, sleep apnea, and history of CLL which she has not required treatment for and is being monitored.  Review of Systems Pertinent positive and negative review of systems were noted in the above HPI section.  All other review of systems was otherwise negative.   Outpatient Encounter Medications as of 09/27/2023  Medication Sig   acetaminophen (TYLENOL) 500 MG tablet Take 1,000 mg by mouth every 6  (six) hours as needed for mild pain.   ASTAXANTHIN PO Take 12 mg by mouth daily.   Azelastine HCl 137 MCG/SPRAY SOLN Place 1 spray into both nostrils as needed.   b complex vitamins capsule Take 1 capsule by mouth daily.   Calcium Carb-Cholecalciferol 600-800 MG-UNIT TABS Take 1 tablet by mouth 2 (two) times daily.   cycloSPORINE (RESTASIS) 0.05 % ophthalmic emulsion INSTILL 1 DROP INTO EACH EYE TWICE DAILY   EPIPEN 2-PAK 0.3 MG/0.3ML SOAJ injection    estradiol (ESTRACE) 0.1 MG/GM vaginal cream Place 1 g vaginally 2 (two) times a week.   fluticasone (FLONASE) 50 MCG/ACT nasal spray Place 2 sprays into both nostrils daily.   Green Tea, Camellia sinensis, (GREEN TEA 600) 600 MG CAPS Take by mouth daily.   ketotifen (ZADITOR) 0.035 % ophthalmic solution Place 1 drop into both eyes 2 (two) times daily.   levocetirizine (XYZAL) 5 MG tablet Take 5 mg by mouth every evening.    linaclotide (LINZESS) 145 MCG CAPS capsule Take 1 capsule (145 mcg total) by mouth daily before breakfast.   Magnesium Citrate 100 MG TABS Take by mouth daily.   MELATONIN FAST DISSOLVE PO Take by mouth at bedtime. 11/25/2022 Takes (2) 15 mg dissolvable tabs.   metFORMIN (GLUCOPHAGE) 500 MG tablet Take 1 tablet (500 mg total) by mouth daily with breakfast.   methocarbamol (ROBAXIN-750) 750 MG tablet Take 1 tablet (750 mg total) by mouth every 6 (six) hours as needed for muscle  spasms.   Misc Natural Products (JOINT HEALTH PO) Take by mouth daily.   montelukast (SINGULAIR) 10 MG tablet Take 1 tablet by mouth every evening.   Multiple Vitamin (MULTIVITAMINS PO) Take 1 tablet by mouth daily.   Omega-3 Fatty Acids (FISH OIL PO) Take 500 mg by mouth daily.   OVER THE COUNTER MEDICATION daily. Nature Made Stress Relief   polyethylene glycol (MIRALAX / GLYCOLAX) packet Take 17 g by mouth daily.   QUERCETIN PO Take 1,000 mg by mouth daily.   Turmeric (QC TUMERIC COMPLEX) 500 MG CAPS Take by mouth daily.   valACYclovir (VALTREX)  500 MG tablet Take 1 tablet (500 mg total) by mouth daily.   ZINC-VITAMIN C PO Take 2 capsules by mouth daily with breakfast.   No facility-administered encounter medications on file as of 09/27/2023.   Allergies  Allergen Reactions   Chlordiazepoxide-Clidinium Other (See Comments)    "loopy"   Codeine Nausea And Vomiting    Dizziness   Penicillins Hives   Patient Active Problem List   Diagnosis Date Noted   Need for influenza vaccination 08/31/2022   Need for RSV immunization 08/31/2022   Weight gain 08/31/2022   Strain of lumbar region 06/02/2022   Thoracic myofascial strain 06/02/2022   Osteopenia 06/02/2022   CLL (chronic lymphocytic leukemia) (HCC) 12/16/2021   Goals of care, counseling/discussion 12/16/2021   Right hip pain 07/13/2021   Right lower quadrant abdominal mass 07/13/2021   Prediabetes 08/12/2020   Healthcare maintenance 11/28/2019   Nonsustained ventricular tachycardia 11/27/2019   Hospital discharge follow-up 11/11/2019   Hypotension 11/11/2019   Hypokalemia    Pain of upper abdomen    Syncope and collapse 11/07/2019   Bradycardia on ECG 11/07/2019   Upper respiratory infection 10/03/2019   Allergic rhinitis    OSA (obstructive sleep apnea) 02/20/2019   HSV (herpes simplex virus) anogenital infection 08/31/2018   Chronic constipation 05/31/2018   Eczema 01/09/2016   GERD (gastroesophageal reflux disease)-probable paroxsysmal relaxation LES 03/14/2012   Hair loss 06/29/2011   GERD 02/28/2011   Palpitations 12/20/2010   VARICOSE VEINS, LOWER EXTREMITIES 01/08/2008   INTESTINAL VOLVULUS, LARGE BOWEL 01/08/2008   Anxiety 01/07/2008   IBS (irritable bowel syndrome) 01/07/2008   MIGRAINES, HX OF 01/07/2008   ATRIAL SEPTAL DEFECT - Status Post Repair 01/08/1988   Social History   Socioeconomic History   Marital status: Married    Spouse name: Richard   Number of children: Not on file   Years of education: Not on file   Highest education level:  12th grade  Occupational History   Occupation: remodel houses    Employer: [employer]  Tobacco Use   Smoking status: Former    Current packs/day: 0.00    Average packs/day: 1 pack/day for 10.0 years (10.0 ttl pk-yrs)    Types: Cigarettes    Start date: 12/12/1978    Quit date: 12/12/1988    Years since quitting: 34.8   Smokeless tobacco: Never  Vaping Use   Vaping status: Never Used  Substance and Sexual Activity   Alcohol use: Yes    Alcohol/week: 1.0 standard drink of alcohol    Types: 1 Glasses of wine per week    Comment: social use/wine rare   Drug use: No   Sexual activity: Yes    Partners: Male    Birth control/protection: Surgical    Comment: Hysterectomy, menarche 62yo, sexual debut 62yo  Other Topics Concern   Not on file  Social History Narrative  They have 2 children and at least two grandchildren. She does all her activities of daily living. 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 She does other days of the gym and she is able to at least 60 minutes a day 5 days a week.   She is a former smoker who quit in 1990. This was after smoking a pack a day for 10 years. She drinks social alcohol on occasion.   Social Determinants of Health   Financial Resource Strain: Low Risk  (09/07/2023)   Overall Financial Resource Strain (CARDIA)    Difficulty of Paying Living Expenses: Not hard at all  Food Insecurity: No Food Insecurity (09/07/2023)   Hunger Vital Sign    Worried About Running Out of Food in the Last Year: Never true    Ran Out of Food in the Last Year: Never true  Transportation Needs: No Transportation Needs (09/07/2023)   PRAPARE - Administrator, Civil Service (Medical): No    Lack of Transportation (Non-Medical): No  Physical Activity: Sufficiently Active (09/07/2023)   Exercise Vital Sign     Days of Exercise per Week: 5 days    Minutes of Exercise per Session: 60 min  Stress: No Stress Concern Present (09/07/2023)   [employer] of Occupational Health - Occupational Stress Questionnaire    Feeling of Stress : Only a little  Social Connections: Moderately Integrated (09/07/2023)   Social Connection and Isolation Panel [NHANES]    Frequency of Communication with Friends and Family: More than three times a week    Frequency of Social Gatherings with Friends and Family: Once a week    Attends Religious Services: More than 4 times per year    Active Member of [employer] or Organizations: No    Attends Engineer, structural: Not on file    Marital Status: Married  Catering manager Violence: Not on file    Ms. Higginbotham family history includes Clotting disorder in her maternal grandmother and sister; Colon cancer in her paternal grandfather; Diabetes in her mother; Heart disease in her maternal grandfather and sister; Lung cancer in her maternal grandmother; Macular degeneration in her mother; Osteoporosis in her mother; Prostate cancer in her maternal grandfather; Scoliosis in her mother.      Objective:    Vitals:   09/27/23 1500  BP: 90/70  Pulse: 69    Physical Exam Well-developed well-nourished WF in no acute distress.  Height, Weight,160 BMI22.67  HEENT; nontraumatic normocephalic, EOMI, PE R LA, sclera anicteric.  Extremities; no clubbing cyanosis or edema skin warm and dry Neuro/Psych; alert and oriented x4, grossly nonfocal mood and affect appropriate        Assessment & Plan:   #63 62 year old white female with IBS-C, who has been on Linzess 145 mcg daily which she feels helps but currently is also using MiraLAX on a daily basis as well as Senokot.  #2 colon cancer screening-up-to-date with last colonoscopy 2020, no polyps #3 diverticulosis #4 history of GERD, currently asymptomatic and not requiring any PPI therapy, has done well with dietary  manipulation. #5 CLL-not requiring treatment, monitoring #6 history of ASD, status postrepair #7.  History of nonsustained V. tach #8.  Sleep apnea  Plan; Will give her a trial of Linzess 290 mcg p.o. daily, she was given samples  today and if she finds the higher dose beneficial of asked her to call back and we will send a prescription for 1 year.  If she does not find this helpful or causes diarrhea etc. then we will need refill on Linzess 145 mcg p.o. daily x 1 year Have asked her to stop the Senokot when she increases dose of Linzess, she can use MiraLAX on an as-needed basis, and we also discussed adding a psyllium fiber supplement.  Will follow-up with Dr. Rhea Belton or myself on an as-needed basis.  Myrtha Tonkovich Oswald Hillock PA-C 10/03/2023   Cc: Mliss Sax,*

## 2023-10-04 DIAGNOSIS — J3089 Other allergic rhinitis: Secondary | ICD-10-CM | POA: Diagnosis not present

## 2023-10-04 DIAGNOSIS — J301 Allergic rhinitis due to pollen: Secondary | ICD-10-CM | POA: Diagnosis not present

## 2023-10-04 DIAGNOSIS — J3081 Allergic rhinitis due to animal (cat) (dog) hair and dander: Secondary | ICD-10-CM | POA: Diagnosis not present

## 2023-10-05 ENCOUNTER — Ambulatory Visit (INDEPENDENT_AMBULATORY_CARE_PROVIDER_SITE_OTHER): Payer: BC Managed Care – PPO | Admitting: Otolaryngology

## 2023-10-05 ENCOUNTER — Encounter (INDEPENDENT_AMBULATORY_CARE_PROVIDER_SITE_OTHER): Payer: Self-pay

## 2023-10-05 VITALS — Ht 70.0 in | Wt 154.0 lb

## 2023-10-05 DIAGNOSIS — H6123 Impacted cerumen, bilateral: Secondary | ICD-10-CM | POA: Diagnosis not present

## 2023-10-05 DIAGNOSIS — J324 Chronic pansinusitis: Secondary | ICD-10-CM

## 2023-10-05 DIAGNOSIS — H900 Conductive hearing loss, bilateral: Secondary | ICD-10-CM | POA: Diagnosis not present

## 2023-10-05 DIAGNOSIS — J338 Other polyp of sinus: Secondary | ICD-10-CM

## 2023-10-09 DIAGNOSIS — J324 Chronic pansinusitis: Secondary | ICD-10-CM | POA: Insufficient documentation

## 2023-10-09 DIAGNOSIS — H6123 Impacted cerumen, bilateral: Secondary | ICD-10-CM | POA: Insufficient documentation

## 2023-10-09 DIAGNOSIS — H900 Conductive hearing loss, bilateral: Secondary | ICD-10-CM | POA: Insufficient documentation

## 2023-10-09 DIAGNOSIS — J338 Other polyp of sinus: Secondary | ICD-10-CM | POA: Insufficient documentation

## 2023-10-09 NOTE — Progress Notes (Signed)
Patient ID: [name], female   DOB: December 22, 1960, 62 y.o.   MRN: [id number]  Cc: Chronic rhinosinusitis, clogging sensation in ears, hearing loss  HPI: The patient is a 62 year old female who returns today for her follow-up evaluation. The patient has a history of bilateral chronic rhinosinusitis. She previously underwent bilateral endoscopic sinus surgery in 2018.  At her last visit 1 year ago, her sinus openings were widely patent.  Her allergy symptoms were treated with Flonase, Xyzal, and immunotherapy.  The patient returns today reporting occasional nasal congestion.  She has no recent known sinusitis.  She uses nasal saline irrigation as needed.  She has a new complaint today of clogging sensation in her ears and muffled hearing.  She has been symptomatic for several months.  She denies any significant otalgia, otorrhea, or vertigo.  Exam: General: Communicates without difficulty, well nourished, no acute distress. Head: Normocephalic, no evidence injury, no tenderness, facial buttresses intact without stepoff. Face/sinus: No tenderness to palpation and percussion. Facial movement is normal and symmetric. Eyes: PERRL, EOMI. No scleral icterus, conjunctivae clear. Neuro: CN II exam reveals vision grossly intact.  No nystagmus at any point of gaze. Ears: Auricles well formed without lesions.  EAC: Bilateral cerumen impaction.  Under the operating microscope, the cerumen is carefully removed with a combination of cerumen currette, alligator forceps, and suction catheters.  After the cerumen is removed, the TMs are noted to be normal.  Nose: External evaluation reveals normal support and skin without lesions.  Dorsum is intact.  Anterior rhinoscopy reveals congested mucosa over anterior aspect of inferior turbinates and intact septum.  No purulence noted. Oral:  Oral cavity and oropharynx are intact, symmetric, without erythema or edema.  Mucosa is moist without lesions. Neck: Full range of motion  without pain.  There is no significant lymphadenopathy.  No masses palpable.  Thyroid bed within normal limits to palpation.  Parotid glands and submandibular glands equal bilaterally without mass.  Trachea is midline. Neuro:  CN 2-12 grossly intact.    Procedure:  Flexible Nasal Endoscopy: Description: Risks, benefits, and alternatives of flexible endoscopy were explained to the patient.  Specific mention was made of the risk of throat numbness with difficulty swallowing, possible bleeding from the nose and mouth, and pain from the procedure.  The patient gave oral consent to proceed.  The flexible scope was inserted into the right nasal cavity.  Endoscopy of the interior nasal cavity, superior, inferior, and middle meatus was performed. The sphenoid-ethmoid recess was examined. Edematous mucosa was noted.  No polyp, mass, or lesion was appreciated.  The sinus openings were patent.  Olfactory cleft was clear.  Nasopharynx was clear.  Turbinates were hypertrophied but without mass. The procedure was repeated on the contralateral side with similar findings.  The patient tolerated the procedure well.  Assessment: 1.  Bilateral transient conductive hearing loss, likely secondary to the cerumen impaction.  She reports improvement in her hearing after the disimpaction procedure. 2.  Chronic rhinitis with mild nasal mucosal congestion bilaterally. 3.  The patient's sinus openings are widely patent.  There is no evidence of acute infection or recurrent polyposis.  Plan: 1.  Otomicroscopy with bilateral cerumen removal. 2.  The physical exam and nasal endoscopy findings are reviewed with the patient. 3.  Continue with her immunotherapy, Xyzal, and Flonase. 4.  The patient will return for reevaluation in 1 year, sooner if needed.

## 2023-10-09 NOTE — Progress Notes (Signed)
Addendum: Reviewed and agree with assessment and management plan. Kadijah Shamoon M, MD  

## 2023-10-16 DIAGNOSIS — Z1231 Encounter for screening mammogram for malignant neoplasm of breast: Secondary | ICD-10-CM | POA: Diagnosis not present

## 2023-10-16 DIAGNOSIS — Z01419 Encounter for gynecological examination (general) (routine) without abnormal findings: Secondary | ICD-10-CM | POA: Diagnosis not present

## 2023-10-16 DIAGNOSIS — Z6822 Body mass index (BMI) 22.0-22.9, adult: Secondary | ICD-10-CM | POA: Diagnosis not present

## 2023-10-19 DIAGNOSIS — J3089 Other allergic rhinitis: Secondary | ICD-10-CM | POA: Diagnosis not present

## 2023-10-19 DIAGNOSIS — J301 Allergic rhinitis due to pollen: Secondary | ICD-10-CM | POA: Diagnosis not present

## 2023-10-19 DIAGNOSIS — J3081 Allergic rhinitis due to animal (cat) (dog) hair and dander: Secondary | ICD-10-CM | POA: Diagnosis not present

## 2023-10-26 ENCOUNTER — Encounter: Payer: Self-pay | Admitting: Hematology & Oncology

## 2023-10-27 DIAGNOSIS — J3081 Allergic rhinitis due to animal (cat) (dog) hair and dander: Secondary | ICD-10-CM | POA: Diagnosis not present

## 2023-10-27 DIAGNOSIS — J3089 Other allergic rhinitis: Secondary | ICD-10-CM | POA: Diagnosis not present

## 2023-10-27 DIAGNOSIS — J301 Allergic rhinitis due to pollen: Secondary | ICD-10-CM | POA: Diagnosis not present

## 2023-10-30 DIAGNOSIS — H2513 Age-related nuclear cataract, bilateral: Secondary | ICD-10-CM | POA: Diagnosis not present

## 2023-11-01 ENCOUNTER — Other Ambulatory Visit: Payer: Self-pay

## 2023-11-01 MED ORDER — LINACLOTIDE 290 MCG PO CAPS: 290.0000 ug | ORAL_CAPSULE | Freq: Every day | ORAL | 6 refills | Status: DC

## 2023-11-02 DIAGNOSIS — L814 Other melanin hyperpigmentation: Secondary | ICD-10-CM | POA: Diagnosis not present

## 2023-11-02 DIAGNOSIS — L578 Other skin changes due to chronic exposure to nonionizing radiation: Secondary | ICD-10-CM | POA: Diagnosis not present

## 2023-11-02 DIAGNOSIS — D225 Melanocytic nevi of trunk: Secondary | ICD-10-CM | POA: Diagnosis not present

## 2023-11-02 DIAGNOSIS — L821 Other seborrheic keratosis: Secondary | ICD-10-CM | POA: Diagnosis not present

## 2023-11-03 DIAGNOSIS — J301 Allergic rhinitis due to pollen: Secondary | ICD-10-CM | POA: Diagnosis not present

## 2023-11-03 DIAGNOSIS — J3089 Other allergic rhinitis: Secondary | ICD-10-CM | POA: Diagnosis not present

## 2023-11-03 DIAGNOSIS — J3081 Allergic rhinitis due to animal (cat) (dog) hair and dander: Secondary | ICD-10-CM | POA: Diagnosis not present

## 2023-11-17 DIAGNOSIS — J3089 Other allergic rhinitis: Secondary | ICD-10-CM | POA: Diagnosis not present

## 2023-11-17 DIAGNOSIS — J3081 Allergic rhinitis due to animal (cat) (dog) hair and dander: Secondary | ICD-10-CM | POA: Diagnosis not present

## 2023-11-17 DIAGNOSIS — J301 Allergic rhinitis due to pollen: Secondary | ICD-10-CM | POA: Diagnosis not present

## 2023-11-21 ENCOUNTER — Encounter: Payer: Self-pay | Admitting: Hematology & Oncology

## 2023-11-22 DIAGNOSIS — J3081 Allergic rhinitis due to animal (cat) (dog) hair and dander: Secondary | ICD-10-CM | POA: Diagnosis not present

## 2023-11-22 DIAGNOSIS — J3089 Other allergic rhinitis: Secondary | ICD-10-CM | POA: Diagnosis not present

## 2023-11-22 DIAGNOSIS — J301 Allergic rhinitis due to pollen: Secondary | ICD-10-CM | POA: Diagnosis not present

## 2023-11-24 ENCOUNTER — Inpatient Hospital Stay: Payer: BC Managed Care – PPO | Admitting: Hematology & Oncology

## 2023-11-24 ENCOUNTER — Encounter: Payer: Self-pay | Admitting: Hematology & Oncology

## 2023-11-24 ENCOUNTER — Inpatient Hospital Stay: Payer: BC Managed Care – PPO

## 2023-11-24 ENCOUNTER — Inpatient Hospital Stay: Payer: BC Managed Care – PPO | Attending: Hematology & Oncology

## 2023-11-24 VITALS — BP 109/55 | HR 59 | Temp 98.2°F | Resp 18 | Ht 70.0 in | Wt 161.8 lb

## 2023-11-24 DIAGNOSIS — Z79624 Long term (current) use of inhibitors of nucleotide synthesis: Secondary | ICD-10-CM | POA: Insufficient documentation

## 2023-11-24 DIAGNOSIS — Z79899 Other long term (current) drug therapy: Secondary | ICD-10-CM | POA: Insufficient documentation

## 2023-11-24 DIAGNOSIS — M8580 Other specified disorders of bone density and structure, unspecified site: Secondary | ICD-10-CM

## 2023-11-24 DIAGNOSIS — C9110 Chronic lymphocytic leukemia of B-cell type not having achieved remission: Secondary | ICD-10-CM | POA: Diagnosis not present

## 2023-11-24 DIAGNOSIS — I83812 Varicose veins of left lower extremities with pain: Secondary | ICD-10-CM | POA: Diagnosis not present

## 2023-11-24 LAB — CBC WITH DIFFERENTIAL (CANCER CENTER ONLY)
Abs Immature Granulocytes: 0.03 10*3/uL (ref 0.00–0.07)
Basophils Absolute: 0.1 10*3/uL (ref 0.0–0.1)
Basophils Relative: 1 %
Eosinophils Absolute: 0.2 10*3/uL (ref 0.0–0.5)
Eosinophils Relative: 1 %
HCT: 41.8 % (ref 36.0–46.0)
Hemoglobin: 13.8 g/dL (ref 12.0–15.0)
Immature Granulocytes: 0 %
Lymphocytes Relative: 75 %
Lymphs Abs: 12.6 10*3/uL — ABNORMAL HIGH (ref 0.7–4.0)
MCH: 31.9 pg (ref 26.0–34.0)
MCHC: 33 g/dL (ref 30.0–36.0)
MCV: 96.8 fL (ref 80.0–100.0)
Monocytes Absolute: 0.8 10*3/uL (ref 0.1–1.0)
Monocytes Relative: 5 %
Neutro Abs: 2.9 10*3/uL (ref 1.7–7.7)
Neutrophils Relative %: 18 %
Platelet Count: 321 10*3/uL (ref 150–400)
RBC: 4.32 MIL/uL (ref 3.87–5.11)
RDW: 12.9 % (ref 11.5–15.5)
Smear Review: NORMAL
WBC Count: 16.7 10*3/uL — ABNORMAL HIGH (ref 4.0–10.5)
nRBC: 0 % (ref 0.0–0.2)

## 2023-11-24 LAB — CMP (CANCER CENTER ONLY)
ALT: 26 U/L (ref 0–44)
AST: 27 U/L (ref 15–41)
Albumin: 4.3 g/dL (ref 3.5–5.0)
Alkaline Phosphatase: 51 U/L (ref 38–126)
Anion gap: 8 (ref 5–15)
BUN: 22 mg/dL (ref 8–23)
CO2: 28 mmol/L (ref 22–32)
Calcium: 9.9 mg/dL (ref 8.9–10.3)
Chloride: 104 mmol/L (ref 98–111)
Creatinine: 0.77 mg/dL (ref 0.44–1.00)
GFR, Estimated: >60 mL/min (ref >60)
Glucose, Bld: 97 mg/dL (ref 70–99)
Potassium: 4.3 mmol/L (ref 3.5–5.1)
Sodium: 140 mmol/L (ref 135–145)
Total Bilirubin: 0.8 mg/dL (ref <1.2)
Total Protein: 7.1 g/dL (ref 6.5–8.1)

## 2023-11-24 LAB — SAVE SMEAR(SSMR), FOR PROVIDER SLIDE REVIEW

## 2023-11-24 LAB — VITAMIN D 25 HYDROXY (VIT D DEFICIENCY, FRACTURES): Vit D, 25-Hydroxy: 86.17 ng/mL (ref 30–100)

## 2023-11-24 NOTE — Progress Notes (Signed)
Hematology and Oncology Follow Up Visit  [name] [id number] 12/02/1961 62 y.o. 11/24/2023   Principle Diagnosis:  CLL-  Stage A  --  13q-/ IVGH mutated  Current Therapy:   Observation     Interim History:  [name] is back for a follow-up.  We see her every 6 months.  As always, she is doing quite nicely.  She is still busy.  She is still working.  She had a very nice Thanksgiving.  She is looking forward to a nice Christmas.  She really has had no problems with infection.  She has no problems with swollen lymph nodes.  She has had no change in bowel or bladder habits.  She has had no cough or shortness of breath.  There is been no problems with COVID.  She continues to exercise.  She really is in great shape.    Currently, I would say her performance status is ECOG 0.    Medications:  Current Outpatient Medications:    acetaminophen (TYLENOL) 500 MG tablet, Take 1,000 mg by mouth every 6 (six) hours as needed for mild pain., Disp: , Rfl:    ASTAXANTHIN PO, Take 12 mg by mouth daily., Disp: , Rfl:    Azelastine HCl 137 MCG/SPRAY SOLN, Place 1 spray into both nostrils as needed., Disp: , Rfl:    b complex vitamins capsule, Take 1 capsule by mouth daily., Disp: , Rfl:    Calcium Carb-Cholecalciferol 600-800 MG-UNIT TABS, Take 1 tablet by mouth 2 (two) times daily., Disp: , Rfl:    clindamycin (CLEOCIN T) 1 % external solution, Apply 1 Application topically daily., Disp: , Rfl:    cycloSPORINE (RESTASIS) 0.05 % ophthalmic emulsion, INSTILL 1 DROP INTO EACH EYE TWICE DAILY, Disp: 60 each, Rfl: 0   estradiol (ESTRACE) 0.1 MG/GM vaginal cream, Place 1 g vaginally 2 (two) times a week., Disp: , Rfl:    fluticasone (FLONASE) 50 MCG/ACT nasal spray, Place 2 sprays into both nostrils daily., Disp: 16 g, Rfl: 0   Green Tea, Camellia sinensis, (GREEN TEA 600) 600 MG CAPS, Take by mouth daily., Disp: , Rfl:    ketotifen (ZADITOR) 0.035 % ophthalmic solution, Place 1 drop into both  eyes 2 (two) times daily., Disp: , Rfl:    levocetirizine (XYZAL) 5 MG tablet, Take 5 mg by mouth every evening. , Disp: , Rfl:    linaclotide (LINZESS) 290 MCG CAPS capsule, Take 1 capsule (290 mcg total) by mouth daily before breakfast., Disp: 30 capsule, Rfl: 6   Magnesium Citrate 100 MG TABS, Take by mouth daily., Disp: , Rfl:    MELATONIN FAST DISSOLVE PO, Take by mouth at bedtime. 11/25/2022 Takes (2) 15 mg dissolvable tabs., Disp: , Rfl:    metFORMIN (GLUCOPHAGE) 500 MG tablet, Take 1 tablet (500 mg total) by mouth daily with breakfast., Disp: 90 tablet, Rfl: 3   methocarbamol (ROBAXIN-750) 750 MG tablet, Take 1 tablet (750 mg total) by mouth every 6 (six) hours as needed for muscle spasms., Disp: 60 tablet, Rfl: 0   Misc Natural Products (JOINT HEALTH PO), Take by mouth daily., Disp: , Rfl:    montelukast (SINGULAIR) 10 MG tablet, Take 1 tablet by mouth every evening., Disp: , Rfl:    Multiple Vitamin (MULTIVITAMINS PO), Take 1 tablet by mouth daily., Disp: , Rfl:    Omega-3 Fatty Acids (FISH OIL PO), Take 500 mg by mouth daily., Disp: , Rfl:    OVER THE COUNTER MEDICATION, daily. Nature Made Stress Relief,  Disp: , Rfl:    polyethylene glycol (MIRALAX / GLYCOLAX) packet, Take 17 g by mouth daily., Disp: , Rfl:    QUERCETIN PO, Take 1,000 mg by mouth daily., Disp: , Rfl:    Turmeric (QC TUMERIC COMPLEX) 500 MG CAPS, Take by mouth daily., Disp: , Rfl:    valACYclovir (VALTREX) 500 MG tablet, Take 1 tablet (500 mg total) by mouth daily., Disp: 90 tablet, Rfl: 3   ZINC-VITAMIN C PO, Take 2 capsules by mouth daily with breakfast., Disp: , Rfl:    EPIPEN 2-PAK 0.3 MG/0.3ML SOAJ injection, , Disp: , Rfl:   Allergies:  Allergies  Allergen Reactions   Chlordiazepoxide-Clidinium Other (See Comments)    "loopy"   Codeine Nausea And Vomiting    Dizziness   Penicillins Hives    Past Medical History, Surgical history, Social history, and Family History were reviewed and updated.  Review of  Systems: Review of Systems  Constitutional: Negative.   HENT:  Negative.    Eyes: Negative.   Respiratory: Negative.    Cardiovascular: Negative.   Gastrointestinal: Negative.   Endocrine: Negative.   Genitourinary: Negative.    Musculoskeletal: Negative.   Skin: Negative.   Neurological: Negative.   Hematological: Negative.   Psychiatric/Behavioral: Negative.      Physical Exam:  height is 5\' 10"  (1.778 m) and weight is 161 lb 12.8 oz (73.4 kg). Her oral temperature is 98.2 F (36.8 C). Her blood pressure is 109/55 (abnormal) and her pulse is 59 (abnormal). Her respiration is 18 and oxygen saturation is 98%.   Wt Readings from Last 3 Encounters:  11/24/23 161 lb 12.8 oz (73.4 kg)  10/05/23 154 lb (69.9 kg)  09/27/23 160 lb 4 oz (72.7 kg)    Physical Exam Vitals reviewed.  HENT:     Head: Normocephalic and atraumatic.  Eyes:     Pupils: Pupils are equal, round, and reactive to light.  Cardiovascular:     Rate and Rhythm: Normal rate and regular rhythm.     Heart sounds: Normal heart sounds.  Pulmonary:     Effort: Pulmonary effort is normal.     Breath sounds: Normal breath sounds.  Abdominal:     General: Bowel sounds are normal.     Palpations: Abdomen is soft.  Musculoskeletal:        General: No tenderness or deformity. Normal range of motion.     Cervical back: Normal range of motion.  Lymphadenopathy:     Cervical: No cervical adenopathy.  Skin:    General: Skin is warm and dry.     Findings: No erythema or rash.  Neurological:     Mental Status: She is alert and oriented to person, place, and time.  Psychiatric:        Behavior: Behavior normal.        Thought Content: Thought content normal.        Judgment: Judgment normal.      Lab Results  Component Value Date   WBC 16.7 (H) 11/24/2023   HGB 13.8 11/24/2023   HCT 41.8 11/24/2023   MCV 96.8 11/24/2023   PLT 321 11/24/2023     Chemistry      Component Value Date/Time   NA 142 05/26/2023  0921   K 3.9 05/26/2023 0921   CL 104 05/26/2023 0921   CO2 31 05/26/2023 0921   BUN 24 (H) 05/26/2023 0921   CREATININE 0.78 05/26/2023 0921      Component Value Date/Time   CALCIUM  9.4 05/26/2023 0921   ALKPHOS 54 05/26/2023 0921   AST 29 05/26/2023 0921   ALT 28 05/26/2023 0921   BILITOT 0.9 05/26/2023 0921      Impression and Plan: Ms. Luca is a very charming 62 year old white female.  She has CLL.  I think this is a low risk CLL.  Everything is so stable right now.  Very happy for her.  We have been following her now for several years.  We will still plan to get her back in 6 months.   12/13/20243:08 PM

## 2023-11-27 ENCOUNTER — Encounter: Payer: Self-pay | Admitting: *Deleted

## 2023-12-01 DIAGNOSIS — J3089 Other allergic rhinitis: Secondary | ICD-10-CM | POA: Diagnosis not present

## 2023-12-07 DIAGNOSIS — J3089 Other allergic rhinitis: Secondary | ICD-10-CM | POA: Diagnosis not present

## 2023-12-15 DIAGNOSIS — J3089 Other allergic rhinitis: Secondary | ICD-10-CM | POA: Diagnosis not present

## 2023-12-18 ENCOUNTER — Ambulatory Visit: Payer: BC Managed Care – PPO | Admitting: Family Medicine

## 2023-12-18 ENCOUNTER — Ambulatory Visit: Payer: Self-pay | Admitting: Family Medicine

## 2023-12-18 VITALS — BP 104/58 | HR 76 | Temp 97.7°F | Wt 159.2 lb

## 2023-12-18 DIAGNOSIS — K5792 Diverticulitis of intestine, part unspecified, without perforation or abscess without bleeding: Secondary | ICD-10-CM

## 2023-12-18 DIAGNOSIS — R1032 Left lower quadrant pain: Secondary | ICD-10-CM | POA: Diagnosis not present

## 2023-12-18 DIAGNOSIS — R112 Nausea with vomiting, unspecified: Secondary | ICD-10-CM

## 2023-12-18 LAB — POC URINALSYSI DIPSTICK (AUTOMATED)
Bilirubin, UA: NEGATIVE
Blood, UA: NEGATIVE
Glucose, UA: NEGATIVE
Ketones, UA: NEGATIVE
Leukocytes, UA: NEGATIVE
Nitrite, UA: NEGATIVE
Protein, UA: NEGATIVE
Spec Grav, UA: 1.01 (ref 1.010–1.025)
Urobilinogen, UA: 0.2 U/dL
pH, UA: 6.5 (ref 5.0–8.0)

## 2023-12-18 MED ORDER — METRONIDAZOLE 500 MG PO TABS: 500.0000 mg | ORAL_TABLET | Freq: Four times a day (QID) | ORAL | 0 refills | Status: AC

## 2023-12-18 MED ORDER — ONDANSETRON 4 MG PO TBDP: 4.0000 mg | ORAL_TABLET | Freq: Three times a day (TID) | ORAL | 0 refills | Status: AC | PRN

## 2023-12-18 MED ORDER — CIPROFLOXACIN HCL 500 MG PO TABS: 500.0000 mg | ORAL_TABLET | Freq: Two times a day (BID) | ORAL | 0 refills | Status: AC

## 2023-12-18 NOTE — Progress Notes (Signed)
 Assessment/Plan:   Problem List Items Addressed This Visit   None Visit Diagnoses       Abdominal pain, left lower quadrant    -  Primary   Relevant Medications   ciprofloxacin  (CIPRO ) 500 MG tablet   metroNIDAZOLE  (FLAGYL ) 500 MG tablet   ondansetron  (ZOFRAN -ODT) 4 MG disintegrating tablet   Other Relevant Orders   POCT Urinalysis Dipstick (Automated) (Completed)   CT ABDOMEN PELVIS WO CONTRAST     Diverticulitis       Relevant Medications   ciprofloxacin  (CIPRO ) 500 MG tablet   metroNIDAZOLE  (FLAGYL ) 500 MG tablet   ondansetron  (ZOFRAN -ODT) 4 MG disintegrating tablet   Other Relevant Orders   CT ABDOMEN PELVIS WO CONTRAST     Nausea and vomiting, unspecified vomiting type       Relevant Medications   ondansetron  (ZOFRAN -ODT) 4 MG disintegrating tablet      Diverticulitis  Assessment: Patient exhibits symptoms indicative of acute diverticulitis, including left lower abdominal pain, tenderness, and gastrointestinal disturbances. Plan: Prescribe Ciprofloxacin  500 mg orally every 12 hours for 7 days. Prescribe Metronidazole  500 mg orally every 6 hours for 7 days. Recommend a clear liquid diet until symptoms improve. Order a CT scan of the abdomen to assess the abdominal bulge and confirm the diagnosis. Prescribe Ondansetron  4 mg orally every 8 hours as needed for nausea. Follow-Up Recommendations: Maintain scheduled appointment with gastroenterology next Monday. Advise to seek immediate care if experiencing worsening abdominal pain, fever, vomiting, or blood in stool. There are no discontinued medications.  Return if symptoms worsen or fail to improve.    Subjective:   Encounter date: 12/18/2023  [name] is a 63 y.o. female who has Anxiety; VARICOSE VEINS, LOWER EXTREMITIES; INTESTINAL VOLVULUS, LARGE BOWEL; IBS (irritable bowel syndrome); ATRIAL SEPTAL DEFECT - Status Post Repair; MIGRAINES, HX OF; Palpitations; GERD; Hair loss; GERD (gastroesophageal  reflux disease)-probable paroxsysmal relaxation LES; Eczema; Chronic constipation; HSV (herpes simplex virus) anogenital infection; OSA (obstructive sleep apnea); Allergic rhinitis; Upper respiratory infection; Syncope and collapse; Bradycardia on ECG; Hypokalemia; Pain of upper abdomen; Hospital discharge follow-up; Hypotension; Nonsustained ventricular tachycardia; Healthcare maintenance; Prediabetes; Right hip pain; Right lower quadrant abdominal mass; CLL (chronic lymphocytic leukemia) (HCC); Goals of care, counseling/discussion; Strain of lumbar region; Thoracic myofascial strain; Osteopenia; Need for influenza vaccination; Need for RSV immunization; Weight gain; Conductive hearing loss, bilateral; Impacted cerumen of both ears; Chronic pansinusitis; and Polyp of nasal sinus on their problem list..   She  has a past medical history of Allergic rhinitis, Allergy, Anxiety, Back pain, CLL (chronic lymphocytic leukemia) (HCC) (12/16/2021), Contact lens/glasses fitting, Depression, Endometriosis, Fatigue, GERD (gastroesophageal reflux disease), Goals of care, counseling/discussion (12/16/2021), Hiatal hernia, History of Ostium Secundum Atrial Septal Defect (1989), migraines, IBS (irritable bowel syndrome), Incontinence, Intestinal volvulus (HCC) (04/2006), Loss of appetite, Monoclonal B-cell lymphocytosis, Onychomycosis, Osteopenia of femoral neck (02/24/2020), Sleep apnea, Syncope (11/07/2019), and Varicose veins of leg with edema, bilateral..   Chief Complaint:  Left lower abdominal pain.  History of Present Illness:  Patient presents with a two-week history of left lower abdominal pain that does not fully subside after bowel movements. They report alternating episodes of constipation and diarrhea. The patient notes a bulge in the left lower abdomen when standing. They have been taking ClearLax, stool softeners, and Linzess  (with a recently increased dosage) for irritable bowel syndrome (IBS).  Occasionally, they use Dulcolax for additional relief. There is no history of injury or surgery to the area.  Today, the patient experiences nausea  with decreased appetite but denies vomiting. They also report nocturnal dull pain in the left hip and leg. The patient has a history of IBS, diverticulosis, intestinal volvulus of the large intestine, and lower lumbar disc disease. They have an upcoming appointment with gastroenterology next Monday and express concern about the seriousness of their symptoms.  Review of Systems:  Constitutional: Denies fever. Gastrointestinal: Reports left lower abdominal pain, alternating constipation and diarrhea, nausea, decreased appetite. Denies vomiting and blood in stool. Neurological: Reports nocturnal dull pain in left hip and leg. Denies back pain. Genitourinary: Denies urinary symptoms. Musculoskeletal: Denies injury to the left abdomen. Remainder of systems: Reviewed and negative.  Past Surgical History:  Procedure Laterality Date   ABDOMINAL HYSTERECTOMY  2004   ASD REPAIR, SECUNDUM  1990   Dr. [employer] REITER SINUPLASTY Left    COLONOSCOPY     ENDOVENOUS ABLATION SAPHENOUS VEIN W/ LASER Left 09/03/2020   EVLA  LSSV  and stab phlebectomy  10-20   EYE SURGERY     muscle reattachment 02/2017   hysterectomy for severe endometriosis     NASAL SEPTUM SURGERY     Dr. Orion   OPEN REDUCTION INTERNAL FIXATION (ORIF) DISTAL RADIAL FRACTURE Right 06/20/2017   Procedure: OPEN REDUCTION INTERNAL FIXATION (ORIF) DISTAL RADIAL FRACTURE;  Surgeon: Murrell Drivers, MD;  Location: [facility];  Service: Orthopedics;  Laterality: Right;   right ankle surgery  2010   right leg fracture with surgery     done with ankle surgery   TRANSTHORACIC ECHOCARDIOGRAM   January 2012   Normal LV size and function, EF greater than 55%. Mild LA dilation. No intra-atrial shunt with bubble study. Normal pulmonary pressures. Only trace to mild mitral and tricuspid  regurgitation.   TRANSTHORACIC ECHOCARDIOGRAM  11/07/2019   EF 60 to 55%.  LVH.  GR 1 DD, with moderate LA dilation.  Relatively normal valves.  RA pressure estimated 8 mmHg.   UPPER GASTROINTESTINAL ENDOSCOPY     varicose laser vein surgery  2009    Outpatient Medications Prior to Visit  Medication Sig Dispense Refill   acetaminophen  (TYLENOL ) 500 MG tablet Take 1,000 mg by mouth every 6 (six) hours as needed for mild pain.     ASTAXANTHIN PO Take 12 mg by mouth daily.     Azelastine  HCl 137 MCG/SPRAY SOLN Place 1 spray into both nostrils as needed.     b complex vitamins capsule Take 1 capsule by mouth daily.     Calcium Carb-Cholecalciferol 600-800 MG-UNIT TABS Take 1 tablet by mouth 2 (two) times daily.     clindamycin (CLEOCIN T) 1 % external solution Apply 1 Application topically daily.     cycloSPORINE  (RESTASIS ) 0.05 % ophthalmic emulsion INSTILL 1 DROP INTO EACH EYE TWICE DAILY 60 each 0   estradiol (ESTRACE) 0.1 MG/GM vaginal cream Place 1 g vaginally 2 (two) times a week.     fluticasone  (FLONASE ) 50 MCG/ACT nasal spray Place 2 sprays into both nostrils daily. 16 g 0   Green Tea, Camellia sinensis, (GREEN TEA 600) 600 MG CAPS Take by mouth daily.     ketotifen (ZADITOR) 0.035 % ophthalmic solution Place 1 drop into both eyes 2 (two) times daily.     levocetirizine (XYZAL ) 5 MG tablet Take 5 mg by mouth every evening.      linaclotide  (LINZESS ) 290 MCG CAPS capsule Take 1 capsule (290 mcg total) by mouth daily before breakfast. 30 capsule 6   Magnesium Citrate 100 MG  TABS Take by mouth daily.     MELATONIN FAST DISSOLVE PO Take by mouth at bedtime. 11/25/2022 Takes (2) 15 mg dissolvable tabs.     metFORMIN  (GLUCOPHAGE ) 500 MG tablet Take 1 tablet (500 mg total) by mouth daily with breakfast. 90 tablet 3   methocarbamol  (ROBAXIN -750) 750 MG tablet Take 1 tablet (750 mg total) by mouth every 6 (six) hours as needed for muscle spasms. 60 tablet 0   Misc Natural Products (JOINT  HEALTH PO) Take by mouth daily.     montelukast  (SINGULAIR ) 10 MG tablet Take 1 tablet by mouth every evening.     Multiple Vitamin (MULTIVITAMINS PO) Take 1 tablet by mouth daily.     Omega-3 Fatty Acids (FISH OIL PO) Take 500 mg by mouth daily.     OVER THE COUNTER MEDICATION daily. Nature Made Stress Relief     polyethylene glycol (MIRALAX  / GLYCOLAX ) packet Take 17 g by mouth daily.     QUERCETIN PO Take 1,000 mg by mouth daily.     Turmeric (QC TUMERIC COMPLEX) 500 MG CAPS Take by mouth daily.     valACYclovir  (VALTREX ) 500 MG tablet Take 1 tablet (500 mg total) by mouth daily. 90 tablet 3   ZINC-VITAMIN C PO Take 2 capsules by mouth daily with breakfast.     EPIPEN  2-PAK 0.3 MG/0.3ML SOAJ injection  (Patient not taking: Reported on 12/18/2023)     No facility-administered medications prior to visit.    Family History  Problem Relation Age of Onset   Diabetes Mother    Osteoporosis Mother    Scoliosis Mother    Macular degeneration Mother    Clotting disorder Sister    Heart disease Sister    Lung cancer Maternal Grandmother        with mets to the brain   Clotting disorder Maternal Grandmother    Heart disease Maternal Grandfather    Prostate cancer Maternal Grandfather    Colon cancer Paternal Grandfather    Colon polyps Neg Hx    Esophageal cancer Neg Hx    Rectal cancer Neg Hx    Stomach cancer Neg Hx     Social History   Socioeconomic History   Marital status: Married    Spouse name: Richard   Number of children: Not on file   Years of education: Not on file   Highest education level: 12th grade  Occupational History   Occupation: remodel houses    Employer: [employer]  Tobacco Use   Smoking status: Former    Current packs/day: 0.00    Average packs/day: 1 pack/day for 10.0 years (10.0 ttl pk-yrs)    Types: Cigarettes    Start date: 12/12/1978    Quit date: 12/12/1988    Years since quitting: 35.0   Smokeless tobacco: Never  Vaping Use   Vaping  status: Never Used  Substance and Sexual Activity   Alcohol use: Yes    Alcohol/week: 1.0 standard drink of alcohol    Types: 1 Glasses of wine per week    Comment: social use/wine rare   Drug use: No   Sexual activity: Yes    Partners: Male    Birth control/protection: Surgical    Comment: Hysterectomy, menarche 63yo, sexual debut 63yo  Other Topics Concern   Not on file  Social History Narrative   They have 2 children and at least two grandchildren. She does all her activities of daily living. 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 She does other days of the gym and she is able to at least 60 minutes a day 5 days a week.   She is a former smoker who quit in 1990. This was after smoking a pack a day for 10 years. She drinks social alcohol on occasion.   Social Drivers of Corporate Investment Banker Strain: Low Risk  (12/18/2023)   Overall Financial Resource Strain (CARDIA)    Difficulty of Paying Living Expenses: Not hard at all  Food Insecurity: No Food Insecurity (12/18/2023)   Hunger Vital Sign    Worried About Running Out of Food in the Last Year: Never true    Ran Out of Food in the Last Year: Never true  Transportation Needs: No Transportation Needs (12/18/2023)   PRAPARE - Administrator, Civil Service (Medical): No    Lack of Transportation (Non-Medical): No  Physical Activity: Sufficiently Active (12/18/2023)   Exercise Vital Sign    Days of Exercise per Week: 4 days    Minutes of Exercise per Session: 60 min  Stress: No Stress Concern Present (12/18/2023)   [employer] of Occupational Health - Occupational Stress Questionnaire    Feeling of Stress : Only a little  Social Connections: Socially Integrated (12/18/2023)   Social Connection and Isolation Panel [NHANES]    Frequency of Communication with Friends and  Family: More than three times a week    Frequency of Social Gatherings with Friends and Family: Once a week    Attends Religious Services: More than 4 times per year    Active Member of [employer] or Organizations: Yes    Attends Banker Meetings: 1 to 4 times per year    Marital Status: Married  Catering Manager Violence: Not on file                                                                                                  Objective:  Physical Exam: BP (!) 104/58   Pulse 76   Temp 97.7 F (36.5 C) (Temporal)   Wt 159 lb 3.2 oz (72.2 kg)   SpO2 96%   BMI 22.84 kg/m     Physical Exam Constitutional:      General: She is not in acute distress.    Appearance: Normal appearance. She is not ill-appearing or toxic-appearing.  HENT:     Head: Normocephalic and atraumatic.     Nose: Nose normal. No congestion.  Eyes:     General: No scleral icterus.    Extraocular Movements: Extraocular movements intact.  Cardiovascular:     Rate and Rhythm: Normal rate and regular rhythm.     Pulses: Normal pulses.     Heart sounds: Normal heart sounds.  Pulmonary:     Effort: Pulmonary effort is normal. No respiratory distress.     Breath sounds: Normal breath sounds.  Abdominal:     General: Abdomen  is flat. Bowel sounds are normal.     Palpations: Abdomen is soft.     Tenderness: There is abdominal tenderness in the left lower quadrant. There is no guarding or rebound.  Musculoskeletal:        General: Normal range of motion.  Lymphadenopathy:     Cervical: No cervical adenopathy.  Skin:    General: Skin is warm and dry.     Findings: No rash.  Neurological:     General: No focal deficit present.     Mental Status: She is alert and oriented to person, place, and time. Mental status is at baseline.  Psychiatric:        Mood and Affect: Mood normal.        Behavior: Behavior normal.        Thought Content: Thought content normal.        Judgment: Judgment normal.      No results found.  Recent Results (from the past 2160 hours)  CBC with Differential (Cancer Center Only)     Status: Abnormal   Collection Time: 11/24/23  2:46 PM  Result Value Ref Range   WBC Count 16.7 (H) 4.0 - 10.5 K/uL   RBC 4.32 3.87 - 5.11 MIL/uL   Hemoglobin 13.8 12.0 - 15.0 g/dL   HCT 58.1 63.9 - 53.9 %   MCV 96.8 80.0 - 100.0 fL   MCH 31.9 26.0 - 34.0 pg   MCHC 33.0 30.0 - 36.0 g/dL   RDW 87.0 88.4 - 84.4 %   Platelet Count 321 150 - 400 K/uL   nRBC 0.0 0.0 - 0.2 %   Neutrophils Relative % 18 %   Neutro Abs 2.9 1.7 - 7.7 K/uL   Lymphocytes Relative 75 %   Lymphs Abs 12.6 (H) 0.7 - 4.0 K/uL   Monocytes Relative 5 %   Monocytes Absolute 0.8 0.1 - 1.0 K/uL   Eosinophils Relative 1 %   Eosinophils Absolute 0.2 0.0 - 0.5 K/uL   Basophils Relative 1 %   Basophils Absolute 0.1 0.0 - 0.1 K/uL   WBC Morphology      Lymphocytosis with morphology consistent with known diagnosis of CLL.   Smear Review Normal platelet morphology    Immature Granulocytes 0 %   Abs Immature Granulocytes 0.03 0.00 - 0.07 K/uL   Smudge Cells PRESENT     Comment: Performed at [facility] at [facility], [address], [place], [place] [address]  CMP (Cancer Center only)     Status: None   Collection Time: 11/24/23  2:46 PM  Result Value Ref Range   Sodium 140 135 - 145 mmol/L   Potassium 4.3 3.5 - 5.1 mmol/L   Chloride 104 98 - 111 mmol/L   CO2 28 22 - 32 mmol/L   Glucose, Bld 97 70 - 99 mg/dL    Comment: Glucose reference range applies only to samples taken after fasting for at least 8 hours.   BUN 22 8 - 23 mg/dL   Creatinine 9.22 9.55 - 1.00 mg/dL   Calcium 9.9 8.9 - 89.6 mg/dL   Total Protein 7.1 6.5 - 8.1 g/dL   Albumin 4.3 3.5 - 5.0 g/dL   AST 27 15 - 41 U/L   ALT 26 0 - 44 U/L   Alkaline Phosphatase 51 38 - 126 U/L   Total Bilirubin 0.8 <1.2 mg/dL   GFR, Estimated >39 >39 mL/min    Comment: (NOTE) Calculated using the CKD-EPI  Creatinine Equation  (2021)    Anion gap 8 5 - 15    Comment: Performed at [facility], [address] [place], [place] [address]  Save Smear for Provider Slide Review     Status: None   Collection Time: 11/24/23  2:46 PM  Result Value Ref Range   Smear Review SMEAR STAINED AND AVAILABLE FOR REVIEW     Comment: Performed at [facility] at [facility], [address] [place], [place] [address]  VITAMIN D  25 Hydroxy (Vit-D Deficiency, Fractures)     Status: None   Collection Time: 11/24/23  3:21 PM  Result Value Ref Range   Vit D, 25-Hydroxy 86.17 30 - 100 ng/mL    Comment: (NOTE) Vitamin D  deficiency has been defined by the Institute of Medicine  and an Endocrine Society practice guideline as a level of serum 25-OH  vitamin D  less than 20 ng/mL (1,2). The Endocrine Society went on to  further define vitamin D  insufficiency as a level between 21 and 29  ng/mL (2).  1. IOM (Institute of Medicine). 2010. Dietary reference intakes for  calcium and D. [place]: [employer]. 2. Holick MF, Binkley [place], Bischoff-Ferrari HA, et al. Evaluation,  treatment, and prevention of vitamin D  deficiency: an Endocrine  Society clinical practice guideline, JCEM. 2011 Jul; 96(7): 1911-30.  Performed at [facility], [address] [place], [place] [address]   POCT Urinalysis Dipstick (Automated)     Status: None   Collection Time: 12/18/23  1:53 PM  Result Value Ref Range   Color, UA yellow    Clarity, UA clear    Glucose, UA Negative Negative   Bilirubin, UA neg    Ketones, UA neg    Spec Grav, UA 1.010 1.010 - 1.025   Blood, UA neg    pH, UA 6.5 5.0 - 8.0   Protein, UA Negative Negative   Urobilinogen, UA 0.2 0.2 or 1.0 E.U./dL   Nitrite, UA neg    Leukocytes, UA Negative Negative        Beverley Adine Hummer, MD, MS

## 2023-12-18 NOTE — Telephone Encounter (Signed)
 Chief Complaint:  Lower left abdomen Pain  Symptoms: Pain  Nausea Frequency:  Started  a couple of weeks after the Holidays and seem to be getting worse. Pertinent Negatives: Patient denies vomiting Disposition: [] ED /[] Urgent Care (no appt availability in office) / [x] Appointment(In office/virtual)/ []  [facility]/ [] Home Care/ [] Refused Recommended Disposition /[] [facility]/ []  Follow-up with PCP Additional Notes:  Patient advised she spoke to someone earlier but called back  because she didn't get to tell everything. Reason for Disposition  [1] MODERATE pain (e.g., interferes with normal activities) AND [2] pain comes and goes (cramps) AND [3] present > 24 hours  (Exception: Pain with Vomiting or Diarrhea - see that Guideline.)  Answer Assessment - Initial Assessment Questions 1. LOCATION: Where does it hurt?      Lower left Side  abdominal pain  with  Diarrhea  but not  fever. 2. RADIATION: Does the pain shoot anywhere else? (e.g., chest, back)      This morning it went  up chest on the left from her abdomen 3. ONSET: When did the pain begin? (e.g., minutes, hours or days ago)       Couple of  weeks after the holidays 4. SUDDEN: Gradual or sudden onset?      5. PATTERN Does the pain come and go, or is it constant?    - If it comes and goes: How long does it last? Do you have pain now?     (Note: Comes and goes means the pain is intermittent. It goes away completely between bouts.)    - If constant: Is it getting better, staying the same, or getting worse?      (Note: Constant means the pain never goes away completely; most serious pain is constant and gets worse.)       Staying  on the left and never leaves 6. SEVERITY: How bad is the pain?  (e.g., Scale 1-10; mild, moderate, or severe)    - MILD (1-3): Doesn't interfere with normal activities, abdomen soft and not tender to touch.     - MODERATE (4-7): Interferes with normal activities or  awakens from sleep, abdomen tender to touch.   Interferring with Sleep and  work 7. RECURRENT SYMPTOM: Have you ever had this type of stomach pain before? If Yes, ask: When was the last time? and What happened that time?  denies      8. Cause What do you think is causing the stomach pain?    Denies 9. RELIEVING/AGGRAVATING FACTORS: What makes it better or worse? (e.g., antacids, bending or twisting motion, bowel movement)    Tylenol  10. OTHER SYMPTOMS: Do you have any other symptoms? (e.g., back pain, diarrhea, fever, urination pain, vomiting)        Diarrhea ,  NAUSEOUS, Urinating coming  11. PREGNANCY: Is there any chance you are pregnant? When was your last menstrual period?       hysterectomy  Protocols used: Abdominal Pain - [facility]

## 2023-12-18 NOTE — Telephone Encounter (Deleted)
  Chief Complaint: *** Symptoms: *** Frequency: *** Pertinent Negatives: Patient denies *** Disposition: [] ED /[] Urgent Care (no appt availability in office) / [] Appointment(In office/virtual)/ []  [facility]/ [] Home Care/ [] Refused Recommended Disposition /[] [facility]/ []  Follow-up with PCP Additional Notes: ***  Reason for Disposition  [1] MODERATE pain (e.g., interferes with normal activities) AND [2] pain comes and goes (cramps) AND [3] present > 24 hours  (Exception: Pain with Vomiting or Diarrhea - see that Guideline.)  Answer Assessment - Initial Assessment Questions 1. LOCATION: Where does it hurt?      Lower left Side  abdominal pain  with  Diarrhea  but not  fever. 2. RADIATION: Does the pain shoot anywhere else? (e.g., chest, back)      This morning it went  up chest on the left 3. ONSET: When did the pain begin? (e.g., minutes, hours or days ago)       Couple of  weeks after the holidays 4. SUDDEN: Gradual or sudden onset?      5. PATTERN Does the pain come and go, or is it constant?    - If it comes and goes: How long does it last? Do you have pain now?     (Note: Comes and goes means the pain is intermittent. It goes away completely between bouts.)    - If constant: Is it getting better, staying the same, or getting worse?      (Note: Constant means the pain never goes away completely; most serious pain is constant and gets worse.)       Staying  on the left and never leaves 6. SEVERITY: How bad is the pain?  (e.g., Scale 1-10; mild, moderate, or severe)    - MILD (1-3): Doesn't interfere with normal activities, abdomen soft and not tender to touch.     - MODERATE (4-7): Interferes with normal activities or awakens from sleep, abdomen tender to touch.   Interferring with Sleep and  work 7. RECURRENT SYMPTOM: Have you ever had this type of stomach pain before? If Yes, ask: When was the last time? and What happened that time?   denies      8. Cause What do you think is causing the stomach pain?    Denies 9. RELIEVING/AGGRAVATING FACTORS: What makes it better or worse? (e.g., antacids, bending or twisting motion, bowel movement)    Tylenol  10. OTHER SYMPTOMS: Do you have any other symptoms? (e.g., back pain, diarrhea, fever, urination pain, vomiting)        Diarrhea ,  NAUSEOUS, Urinating coming  11. PREGNANCY: Is there any chance you are pregnant? When was your last menstrual period?       hysterectomy  Protocols used: Abdominal Pain - [facility]

## 2023-12-18 NOTE — Patient Instructions (Signed)
-   Take ciprofloxacin  500 mg every 12 hours and metronidazole  500 mg every 6 hours for 7 days as prescribed. - Use ondansetron  4 mg as needed to relieve nausea. - Undergo the ordered CT scan of your abdomen to evaluate the bulge and abdominal pain. - Follow a clear liquid diet, including broths and fluids, until your symptoms improve. - Watch for worsening symptoms like increased abdominal pain, nausea, vomiting, or blood in your stool, and seek immediate medical attention if they occur.

## 2023-12-19 ENCOUNTER — Ambulatory Visit: Payer: BC Managed Care – PPO

## 2023-12-19 DIAGNOSIS — K5792 Diverticulitis of intestine, part unspecified, without perforation or abscess without bleeding: Secondary | ICD-10-CM

## 2023-12-19 DIAGNOSIS — K5730 Diverticulosis of large intestine without perforation or abscess without bleeding: Secondary | ICD-10-CM | POA: Diagnosis not present

## 2023-12-19 DIAGNOSIS — R1032 Left lower quadrant pain: Secondary | ICD-10-CM

## 2023-12-19 DIAGNOSIS — D1803 Hemangioma of intra-abdominal structures: Secondary | ICD-10-CM | POA: Diagnosis not present

## 2023-12-19 DIAGNOSIS — C9110 Chronic lymphocytic leukemia of B-cell type not having achieved remission: Secondary | ICD-10-CM | POA: Diagnosis not present

## 2023-12-20 DIAGNOSIS — J3081 Allergic rhinitis due to animal (cat) (dog) hair and dander: Secondary | ICD-10-CM | POA: Diagnosis not present

## 2023-12-20 DIAGNOSIS — J3089 Other allergic rhinitis: Secondary | ICD-10-CM | POA: Diagnosis not present

## 2023-12-20 DIAGNOSIS — J301 Allergic rhinitis due to pollen: Secondary | ICD-10-CM | POA: Diagnosis not present

## 2023-12-25 ENCOUNTER — Ambulatory Visit: Payer: BC Managed Care – PPO | Admitting: Physician Assistant

## 2023-12-25 ENCOUNTER — Other Ambulatory Visit: Payer: BC Managed Care – PPO

## 2023-12-25 ENCOUNTER — Encounter: Payer: Self-pay | Admitting: Physician Assistant

## 2023-12-25 VITALS — BP 110/70 | HR 59 | Ht 70.0 in | Wt 163.0 lb

## 2023-12-25 DIAGNOSIS — Z856 Personal history of leukemia: Secondary | ICD-10-CM

## 2023-12-25 DIAGNOSIS — K5909 Other constipation: Secondary | ICD-10-CM

## 2023-12-25 DIAGNOSIS — K581 Irritable bowel syndrome with constipation: Secondary | ICD-10-CM

## 2023-12-25 DIAGNOSIS — K5732 Diverticulitis of large intestine without perforation or abscess without bleeding: Secondary | ICD-10-CM

## 2023-12-25 DIAGNOSIS — K219 Gastro-esophageal reflux disease without esophagitis: Secondary | ICD-10-CM | POA: Diagnosis not present

## 2023-12-25 DIAGNOSIS — R109 Unspecified abdominal pain: Secondary | ICD-10-CM

## 2023-12-25 DIAGNOSIS — K5790 Diverticulosis of intestine, part unspecified, without perforation or abscess without bleeding: Secondary | ICD-10-CM | POA: Diagnosis not present

## 2023-12-25 DIAGNOSIS — Z87891 Personal history of nicotine dependence: Secondary | ICD-10-CM

## 2023-12-25 DIAGNOSIS — R1032 Left lower quadrant pain: Secondary | ICD-10-CM | POA: Diagnosis not present

## 2023-12-25 DIAGNOSIS — F1090 Alcohol use, unspecified, uncomplicated: Secondary | ICD-10-CM

## 2023-12-25 DIAGNOSIS — R194 Change in bowel habit: Secondary | ICD-10-CM

## 2023-12-25 MED ORDER — GLYCOPYRROLATE 2 MG PO TABS: 2.0000 mg | ORAL_TABLET | Freq: Two times a day (BID) | ORAL | 3 refills | Status: DC | PRN

## 2023-12-25 NOTE — Patient Instructions (Addendum)
 Your provider has requested that you go to the basement level for lab work before leaving today. Press B on the elevator. The lab is located at the first door on the left as you exit the elevator.  We have sent the following medications to your pharmacy for you to pick up at your convenience: Glycopyrrolate  2 mg twice daily as needed for abdominal pain and cramping.   Continue Linzess  290 mcg daily before breakfast.   _______________________________________________________  If your blood pressure at your visit was 140/90 or greater, please contact your primary care physician to follow up on this.  _______________________________________________________  If you are age 65 or older, your body mass index should be between 23-30. Your Body mass index is 23.39 kg/m. If this is out of the aforementioned range listed, please consider follow up with your Primary Care Provider.  If you are age 19 or younger, your body mass index should be between 19-25. Your Body mass index is 23.39 kg/m. If this is out of the aformentioned range listed, please consider follow up with your Primary Care Provider.   ________________________________________________________  The [facility] providers would like to encourage you to use MYCHART to communicate with providers for non-urgent requests or questions.  Due to long hold times on the telephone, sending your provider a message by [facility] may be a faster and more efficient way to get a response.  Please allow 48 business hours for a response.  Please remember that this is for non-urgent requests.  _______________________________________________________

## 2023-12-27 ENCOUNTER — Encounter: Payer: Self-pay | Admitting: Physician Assistant

## 2023-12-27 LAB — TISSUE TRANSGLUTAMINASE ABS,IGG,IGA
(tTG) Ab, IgA: <1 U/mL
(tTG) Ab, IgG: <1 U/mL

## 2023-12-27 LAB — IGA: Immunoglobulin A: 213 mg/dL (ref 70–320)

## 2023-12-27 NOTE — Progress Notes (Addendum)
Subjective:    Patient ID: [name], female    DOB: 1961-08-16, 63 y.o.   MRN: [id number]  HPI [name] is a pleasant 63 year old white female, established with Dr. Rhea Belton who comes in today for follow-up.  She was last seen here in October 2024 by myself.  She does have history of IBS, chronic constipation, and at that time we increased her Linzess from 145 to 290 mcg daily. She does have prior history of a colonic volvulus.  Other medical problems include history of atrial septal defect status postrepair, NSVT, GERD, anxiety, and was diagnosed with CLL in 2022.  She is being followed and has not required any treatment to date. Her last colonoscopy was done in December 2020 with finding of multiple diverticuli in the sigmoid colon and otherwise negative exam. Last EGD March 2022 with grade a esophagitis, and multiple fundal sessile polyps.  She did have pH study done at that same setting.  Biopsy showed fundic land polyps no intestinal metaplasia.  She was very recently seen at the med center [facility] and underwent a CT scan of the abdomen and pelvis on 12/19/2023 for complaints of left lower quadrant abdominal pain.  Did not have any acute findings in the abdomen or pelvis, diverticulosis noted of the sigmoid colon but no acute diverticulitis, degenerative disc disease in the lumbar spine also noted.  No associated fever or chills, no bleeding She was given an empiric course of Cipro and Flagyl by her PCP and is finishing the Flagyl/metronidazole today, has completed the Cipro.  Around that same time she had onset of nausea, poor appetite, and then developed the which she describes as "bad" pain in the left side which he became constant.  She also was feeling very constipated at that time and full. She is continue taking the Linzess and now over the past day she feels that her bowel is finally opened back up and she is actually having loose stools as of today having had 4 bowel movements  prior to this visit.  She was concerned because her weight had been creeping up as well and now thinks that may have been due to constipation.  She is also been using MiraLAX as needed Her lower abdominal pain has definitely improved. She is inquiring about celiac disease today as she does feel that a lot of gluten products exacerbate her constipation symptoms.  Review of Systems Pertinent positive and negative review of systems were noted in the above HPI section.  All other review of systems was otherwise negative.   Outpatient Encounter Medications as of 12/25/2023  Medication Sig   acetaminophen (TYLENOL) 500 MG tablet Take 1,000 mg by mouth every 6 (six) hours as needed for mild pain.   ASTAXANTHIN PO Take 12 mg by mouth daily.   Azelastine HCl 137 MCG/SPRAY SOLN Place 1 spray into both nostrils as needed.   b complex vitamins capsule Take 1 capsule by mouth daily.   Calcium Carb-Cholecalciferol 600-800 MG-UNIT TABS Take 1 tablet by mouth 2 (two) times daily.   [EXPIRED] ciprofloxacin (CIPRO) 500 MG tablet Take 1 tablet (500 mg total) by mouth 2 (two) times daily for 7 days.   clindamycin (CLEOCIN T) 1 % external solution Apply 1 Application topically daily.   cycloSPORINE (RESTASIS) 0.05 % ophthalmic emulsion INSTILL 1 DROP INTO EACH EYE TWICE DAILY   EPIPEN 2-PAK 0.3 MG/0.3ML SOAJ injection    estradiol (ESTRACE) 0.1 MG/GM vaginal cream Place 1 g vaginally 2 (two) times a  week.   fluticasone (FLONASE) 50 MCG/ACT nasal spray Place 2 sprays into both nostrils daily.   glycopyrrolate (ROBINUL) 2 MG tablet Take 1 tablet (2 mg total) by mouth 2 (two) times daily as needed.   Green Tea, Camellia sinensis, (GREEN TEA 600) 600 MG CAPS Take by mouth daily.   ketotifen (ZADITOR) 0.035 % ophthalmic solution Place 1 drop into both eyes 2 (two) times daily.   levocetirizine (XYZAL) 5 MG tablet Take 5 mg by mouth every evening.    linaclotide (LINZESS) 290 MCG CAPS capsule Take 1 capsule (290 mcg  total) by mouth daily before breakfast.   Magnesium Citrate 100 MG TABS Take by mouth daily.   MELATONIN FAST DISSOLVE PO Take by mouth at bedtime. 11/25/2022 Takes (2) 15 mg dissolvable tabs.   metFORMIN (GLUCOPHAGE) 500 MG tablet Take 1 tablet (500 mg total) by mouth daily with breakfast.   methocarbamol (ROBAXIN-750) 750 MG tablet Take 1 tablet (750 mg total) by mouth every 6 (six) hours as needed for muscle spasms.   [EXPIRED] metroNIDAZOLE (FLAGYL) 500 MG tablet Take 1 tablet (500 mg total) by mouth 4 (four) times daily for 7 days.   Misc Natural Products (JOINT HEALTH PO) Take by mouth daily.   montelukast (SINGULAIR) 10 MG tablet Take 1 tablet by mouth every evening.   Multiple Vitamin (MULTIVITAMINS PO) Take 1 tablet by mouth daily.   Omega-3 Fatty Acids (FISH OIL PO) Take 500 mg by mouth daily.   ondansetron (ZOFRAN-ODT) 4 MG disintegrating tablet Take 1 tablet (4 mg total) by mouth every 8 (eight) hours as needed for nausea or vomiting.   OVER THE COUNTER MEDICATION daily. Nature Made Stress Relief   polyethylene glycol (MIRALAX / GLYCOLAX) packet Take 17 g by mouth daily.   QUERCETIN PO Take 1,000 mg by mouth daily.   Turmeric (QC TUMERIC COMPLEX) 500 MG CAPS Take by mouth daily.   valACYclovir (VALTREX) 500 MG tablet Take 1 tablet (500 mg total) by mouth daily.   ZINC-VITAMIN C PO Take 2 capsules by mouth daily with breakfast.   No facility-administered encounter medications on file as of 12/25/2023.   Allergies  Allergen Reactions   Chlordiazepoxide-Clidinium Other (See Comments)    "loopy"   Codeine Nausea And Vomiting    Dizziness   Penicillins Hives   Patient Active Problem List   Diagnosis Date Noted   Conductive hearing loss, bilateral 10/09/2023   Impacted cerumen of both ears 10/09/2023   Chronic pansinusitis 10/09/2023   Polyp of nasal sinus 10/09/2023   Need for influenza vaccination 08/31/2022   Need for RSV immunization 08/31/2022   Weight gain 08/31/2022    Strain of lumbar region 06/02/2022   Thoracic myofascial strain 06/02/2022   Osteopenia 06/02/2022   CLL (chronic lymphocytic leukemia) (HCC) 12/16/2021   Goals of care, counseling/discussion 12/16/2021   Right hip pain 07/13/2021   Right lower quadrant abdominal mass 07/13/2021   Prediabetes 08/12/2020   Healthcare maintenance 11/28/2019   Nonsustained ventricular tachycardia 11/27/2019   Hospital discharge follow-up 11/11/2019   Hypotension 11/11/2019   Hypokalemia    Pain of upper abdomen    Syncope and collapse 11/07/2019   Bradycardia on ECG 11/07/2019   Upper respiratory infection 10/03/2019   Allergic rhinitis    OSA (obstructive sleep apnea) 02/20/2019   HSV (herpes simplex virus) anogenital infection 08/31/2018   Chronic constipation 05/31/2018   Eczema 01/09/2016   GERD (gastroesophageal reflux disease)-probable paroxsysmal relaxation LES 03/14/2012   Hair loss 06/29/2011  GERD 02/28/2011   Palpitations 12/20/2010   VARICOSE VEINS, LOWER EXTREMITIES 01/08/2008   INTESTINAL VOLVULUS, LARGE BOWEL 01/08/2008   Anxiety 01/07/2008   IBS (irritable bowel syndrome) 01/07/2008   MIGRAINES, HX OF 01/07/2008   ATRIAL SEPTAL DEFECT - Status Post Repair 01/08/1988   Social History   Socioeconomic History   Marital status: Married    Spouse name: Richard   Number of children: Not on file   Years of education: Not on file   Highest education level: 12th grade  Occupational History   Occupation: remodel houses    Employer: [employer]  Tobacco Use   Smoking status: Former    Current packs/day: 0.00    Average packs/day: 1 pack/day for 10.0 years (10.0 ttl pk-yrs)    Types: Cigarettes    Start date: 12/12/1978    Quit date: 12/12/1988    Years since quitting: 35.0   Smokeless tobacco: Never  Vaping Use   Vaping status: Never Used  Substance and Sexual Activity   Alcohol use: Yes    Alcohol/week: 1.0 standard drink of alcohol    Types: 1 Glasses of wine  per week    Comment: social use/wine rare   Drug use: No   Sexual activity: Yes    Partners: Male    Birth control/protection: Surgical    Comment: Hysterectomy, menarche 63yo, sexual debut 63yo  Other Topics Concern   Not on file  Social History Narrative   They have 2 children and at least two grandchildren. She does all her activities of daily living. 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 She does other days of the gym and she is able to at least 60 minutes a day 5 days a week.   She is a former smoker who quit in 1990. This was after smoking a pack a day for 10 years. She drinks social alcohol on occasion.   Social Drivers of Corporate investment banker Strain: Low Risk  (12/18/2023)   Overall Financial Resource Strain (CARDIA)    Difficulty of Paying Living Expenses: Not hard at all  Food Insecurity: No Food Insecurity (12/18/2023)   Hunger Vital Sign    Worried About Running Out of Food in the Last Year: Never true    Ran Out of Food in the Last Year: Never true  Transportation Needs: No Transportation Needs (12/18/2023)   PRAPARE - Administrator, Civil Service (Medical): No    Lack of Transportation (Non-Medical): No  Physical Activity: Sufficiently Active (12/18/2023)   Exercise Vital Sign    Days of Exercise per Week: 4 days    Minutes of Exercise per Session: 60 min  Stress: No Stress Concern Present (12/18/2023)   [employer] of Occupational Health - Occupational Stress Questionnaire    Feeling of Stress : Only a little  Social Connections: Socially Integrated (12/18/2023)   Social Connection and Isolation Panel [NHANES]    Frequency of Communication with Friends and Family: More than three times a week    Frequency of Social Gatherings with Friends and Family: Once a week    Attends Religious Services: More  than 4 times per year    Active Member of [employer] or Organizations: Yes    Attends Banker Meetings: 1 to 4 times per  year    Marital Status: Married  Catering manager Violence: Not on file    Ms. Lacosse family history includes Clotting disorder in her maternal grandmother and sister; Colon cancer in her paternal grandfather; Diabetes in her mother; Heart disease in her maternal grandfather and sister; Lung cancer in her maternal grandmother; Macular degeneration in her mother; Osteoporosis in her mother; Prostate cancer in her maternal grandfather; Scoliosis in her mother.      Objective:    Vitals:   12/25/23 1321  BP: 110/70  Pulse: (!) 59    Physical Exam Well-developed well-nourished older white female in no acute distress.  Height, Weight, 163 BMI 23.3  HEENT; nontraumatic normocephalic, EOMI, PE R LA, sclera anicteric. Oropharynx; not examined today Neck; supple, no JVD Cardiovascular; regular rate and rhythm with S1-S2, no murmur rub or gallop Pulmonary; Clear bilaterally Abdomen; soft, slight tenderness left mid quadrant, no guarding or rebound, nondistended, no palpable mass or hepatosplenomegaly, bowel sounds are active Rectal; not done today Skin; benign exam, no jaundice rash or appreciable lesions Extremities; no clubbing cyanosis or edema skin warm and dry Neuro/Psych; alert and oriented x4, grossly nonfocal mood and affect appropriate        Assessment & Plan:   #4 63 year old white female with history of diverticulosis, episode of left lower quadrant pain occurring about 9 days ago and persisting, associated with sense of constipation and fullness. CT imaging was done on 12/19/2023 showing diverticulosis but no diverticulitis.  She was treated by her PCP with a course of Cipro and Flagyl which she is completing today. She continued taking her Linzess 290 mcg daily while on the antibiotics, has also been using MiraLAX and now finally feels that her  bowel has opened up and she is feeling better.  Had 4 diarrheal/loose stools today for the first time. Abdominal exam is benign  She may have had some subtle diverticulitis, versus symptoms all secondary to obstipation/IBS.  #2 chronic constipation #3 GERD-does not require regular medication #4.  History of CLL diagnosed 2022, has not required treatment, undergoing serial monitoring #5.  Prior history of colonic volvulus #6.  Sleep apnea #7.  Status post remote ASD repair  #8 colon cancer screening-last colonoscopy December 2020 negative for diverticulosis and indicated for 10-year interval follow-up  Plan; Patient is completing antibiotics today Check TTG/IGA Continue Linzess 290 mcg daily Start trial of glycopyrrolate 2 mg p.o. daily to twice daily as needed for abdominal pain/spasm Continue MiraLAX emetine grams in 8 ounces of water on an as-needed basis in addition to Linzess Follow-up with Dr. Rhea Belton on an as-needed basis  Kaeleen Odom Oswald Hillock PA-C 12/27/2023   Cc: Mliss Sax,*

## 2023-12-29 NOTE — Progress Notes (Signed)
Addendum: Reviewed and agree with assessment and management plan. Kadijah Shamoon M, MD  

## 2024-01-01 DIAGNOSIS — N760 Acute vaginitis: Secondary | ICD-10-CM | POA: Diagnosis not present

## 2024-01-05 DIAGNOSIS — J3089 Other allergic rhinitis: Secondary | ICD-10-CM | POA: Diagnosis not present

## 2024-01-05 DIAGNOSIS — J3081 Allergic rhinitis due to animal (cat) (dog) hair and dander: Secondary | ICD-10-CM | POA: Diagnosis not present

## 2024-01-05 DIAGNOSIS — J301 Allergic rhinitis due to pollen: Secondary | ICD-10-CM | POA: Diagnosis not present

## 2024-01-15 DIAGNOSIS — J301 Allergic rhinitis due to pollen: Secondary | ICD-10-CM | POA: Diagnosis not present

## 2024-01-15 DIAGNOSIS — J3081 Allergic rhinitis due to animal (cat) (dog) hair and dander: Secondary | ICD-10-CM | POA: Diagnosis not present

## 2024-01-15 DIAGNOSIS — J3089 Other allergic rhinitis: Secondary | ICD-10-CM | POA: Diagnosis not present

## 2024-01-24 ENCOUNTER — Other Ambulatory Visit: Payer: Self-pay

## 2024-01-24 ENCOUNTER — Telehealth: Payer: Self-pay

## 2024-01-24 DIAGNOSIS — R194 Change in bowel habit: Secondary | ICD-10-CM

## 2024-01-24 DIAGNOSIS — R109 Unspecified abdominal pain: Secondary | ICD-10-CM

## 2024-01-24 MED ORDER — SUFLAVE 178.7 G PO SOLR: 1.0000 | Freq: Once | ORAL | 0 refills | Status: AC

## 2024-01-24 NOTE — Telephone Encounter (Signed)
Spoke with the patient. Arranged procedure date for colonoscopy. No anticoagulation medications. Takes metformin. No GLP1 or insulins. No oxygen. Last ECHO 60-65% EF. She will have another ECHO end of this month.  [facility] explained. Instructions mailed to the patient.

## 2024-01-26 DIAGNOSIS — J3089 Other allergic rhinitis: Secondary | ICD-10-CM | POA: Diagnosis not present

## 2024-02-02 ENCOUNTER — Ambulatory Visit (HOSPITAL_COMMUNITY): Payer: BC Managed Care – PPO | Attending: Cardiology

## 2024-02-02 DIAGNOSIS — J3089 Other allergic rhinitis: Secondary | ICD-10-CM | POA: Diagnosis not present

## 2024-02-02 DIAGNOSIS — Q2111 Secundum atrial septal defect: Secondary | ICD-10-CM | POA: Diagnosis not present

## 2024-02-02 DIAGNOSIS — J301 Allergic rhinitis due to pollen: Secondary | ICD-10-CM | POA: Diagnosis not present

## 2024-02-02 DIAGNOSIS — R55 Syncope and collapse: Secondary | ICD-10-CM | POA: Diagnosis not present

## 2024-02-02 DIAGNOSIS — J3081 Allergic rhinitis due to animal (cat) (dog) hair and dander: Secondary | ICD-10-CM | POA: Diagnosis not present

## 2024-02-02 LAB — ECHOCARDIOGRAM COMPLETE
Area-P 1/2: 3.21 cm2
S' Lateral: 2.9 cm

## 2024-02-06 ENCOUNTER — Telehealth: Payer: Self-pay | Admitting: Cardiology

## 2024-02-06 NOTE — Telephone Encounter (Signed)
  Patient would like to know her results for her Echo and if she will need to make an appointment

## 2024-02-07 ENCOUNTER — Telehealth: Payer: Self-pay

## 2024-02-07 NOTE — Telephone Encounter (Addendum)
 Called patient regarding results. Patient had understanding of results.----- Message from Cannon Kettle sent at 02/06/2024 10:04 PM EST ----- Patient with history of ASD status post repair in 1989  -No residual ASD/shunting noted. -Otherwise normal ejection fraction 60 to 65%, no significant valve disease, unremarkable study.  Return for annual appointment in October 2025.

## 2024-02-09 DIAGNOSIS — J3089 Other allergic rhinitis: Secondary | ICD-10-CM | POA: Diagnosis not present

## 2024-02-16 DIAGNOSIS — J3089 Other allergic rhinitis: Secondary | ICD-10-CM | POA: Diagnosis not present

## 2024-02-16 DIAGNOSIS — J301 Allergic rhinitis due to pollen: Secondary | ICD-10-CM | POA: Diagnosis not present

## 2024-02-16 DIAGNOSIS — J3081 Allergic rhinitis due to animal (cat) (dog) hair and dander: Secondary | ICD-10-CM | POA: Diagnosis not present

## 2024-02-22 ENCOUNTER — Other Ambulatory Visit (HOSPITAL_COMMUNITY): Payer: Managed Care, Other (non HMO)

## 2024-02-23 DIAGNOSIS — J3089 Other allergic rhinitis: Secondary | ICD-10-CM | POA: Diagnosis not present

## 2024-02-28 ENCOUNTER — Encounter: Payer: Self-pay | Admitting: Internal Medicine

## 2024-03-04 DIAGNOSIS — J3089 Other allergic rhinitis: Secondary | ICD-10-CM | POA: Diagnosis not present

## 2024-03-07 ENCOUNTER — Telehealth: Payer: Self-pay

## 2024-03-07 ENCOUNTER — Ambulatory Visit: Payer: BC Managed Care – PPO | Admitting: Internal Medicine

## 2024-03-07 ENCOUNTER — Encounter: Payer: Self-pay | Admitting: Internal Medicine

## 2024-03-07 VITALS — BP 104/40 | HR 46 | Temp 98.2°F | Resp 12 | Ht 70.0 in | Wt 163.0 lb

## 2024-03-07 DIAGNOSIS — K6389 Other specified diseases of intestine: Secondary | ICD-10-CM

## 2024-03-07 DIAGNOSIS — R194 Change in bowel habit: Secondary | ICD-10-CM

## 2024-03-07 DIAGNOSIS — K5730 Diverticulosis of large intestine without perforation or abscess without bleeding: Secondary | ICD-10-CM

## 2024-03-07 DIAGNOSIS — D123 Benign neoplasm of transverse colon: Secondary | ICD-10-CM | POA: Diagnosis not present

## 2024-03-07 DIAGNOSIS — Z8719 Personal history of other diseases of the digestive system: Secondary | ICD-10-CM

## 2024-03-07 DIAGNOSIS — R109 Unspecified abdominal pain: Secondary | ICD-10-CM

## 2024-03-07 MED ORDER — PRUCALOPRIDE SUCCINATE 2 MG PO TABS: 2.0000 mg | ORAL_TABLET | Freq: Every day | ORAL | 5 refills | Status: DC

## 2024-03-07 MED ORDER — SODIUM CHLORIDE 0.9 % IV SOLN: 500.0000 mL | Freq: Once | INTRAVENOUS | Status: DC

## 2024-03-07 NOTE — Patient Instructions (Addendum)
-  Handout on polyps and diverticulosis provided -await pathology results -repeat colonoscopy for surveillance recommended. Date to be determined when pathology result become available   -Continue present medications   YOU HAD AN ENDOSCOPIC PROCEDURE TODAY AT THE [facility]:   Refer to the procedure report that was given to you for any specific questions about what was found during the examination.  If the procedure report does not answer your questions, please call your gastroenterologist to clarify.  If you requested that your care partner not be given the details of your procedure findings, then the procedure report has been included in a sealed envelope for you to review at your convenience later.  YOU SHOULD EXPECT: Some feelings of bloating in the abdomen. Passage of more gas than usual.  Walking can help get rid of the air that was put into your GI tract during the procedure and reduce the bloating. If you had a lower endoscopy (such as a colonoscopy or flexible sigmoidoscopy) you may notice spotting of blood in your stool or on the toilet paper. If you underwent a bowel prep for your procedure, you may not have a normal bowel movement for a few days.  Please Note:  You might notice some irritation and congestion in your nose or some drainage.  This is from the oxygen used during your procedure.  There is no need for concern and it should clear up in a day or so.  SYMPTOMS TO REPORT IMMEDIATELY:  Following lower endoscopy (colonoscopy or flexible sigmoidoscopy):  Excessive amounts of blood in the stool  Significant tenderness or worsening of abdominal pains  Swelling of the abdomen that is new, acute  Fever of 100F or higher   For urgent or emergent issues, a gastroenterologist can be reached at any hour by calling [phone]. Do not use MyChart messaging for urgent concerns.    DIET:  We do recommend a small meal at first, but then you may proceed to your regular  diet.  Drink plenty of fluids but you should avoid alcoholic beverages for 24 hours.  ACTIVITY:  You should plan to take it easy for the rest of today and you should NOT DRIVE or use heavy machinery until tomorrow (because of the sedation medicines used during the test).    FOLLOW UP: Our staff will call the number listed on your records the next business day following your procedure.  We will call around 7:15- 8:00 am to check on you and address any questions or concerns that you may have regarding the information given to you following your procedure. If we do not reach you, we will leave a message.     If any biopsies were taken you will be contacted by phone or by letter within the next 1-3 weeks.  Please call us at [phone] if you have not heard about the biopsies in 3 weeks.    SIGNATURES/CONFIDENTIALITY: You and/or your care partner have signed paperwork which will be entered into your electronic medical record.  These signatures attest to the fact that that the information above on your After Visit Summary has been reviewed and is understood.  Full responsibility of the confidentiality of this discharge information lies with you and/or your care-partner.   

## 2024-03-07 NOTE — Progress Notes (Signed)
 Updated medical record.

## 2024-03-07 NOTE — Telephone Encounter (Signed)
-----   Message from Carie Caddy Pyrtle sent at 03/07/2024  3:29 PM EDT ----- Patient on MiraLAX, Linzess 290 mcg and stool softeners Still in complete evacuation with constipation and left lower quadrant pain  Please discontinue Linzess and start Motegrity 3 mg daily  Patient aware that we will submit this and she will remain on Linzess until [employer] available JMP

## 2024-03-07 NOTE — Telephone Encounter (Signed)
 Prescription for Motegrity 2 mg daily sent to patient's pharmacy.

## 2024-03-07 NOTE — Op Note (Signed)
 [facility] Patient Name: [name] Procedure Date: 03/07/2024 2:32 PM MRN: [id number] Endoscopist: Beverley Fiedler , MD, [id number] Age: 63 Referring MD:  Date of Birth: 1961-04-10 Gender: Female Account #: [id number] Procedure:                Colonoscopy Indications:              Abdominal pain in the left lower quadrant, recently                            treated diverticulitis, last colonoscopy Dec 2020                            (no polyps) Medicines:                Monitored Anesthesia Care Procedure:                Pre-Anesthesia Assessment:                           - Prior to the procedure, a History and Physical                            was performed, and patient medications and                            allergies were reviewed. The patient's tolerance of                            previous anesthesia was also reviewed. The risks                            and benefits of the procedure and the sedation                            options and risks were discussed with the patient.                            All questions were answered, and informed consent                            was obtained. Prior Anticoagulants: The patient has                            taken no anticoagulant or antiplatelet agents. ASA                            Grade Assessment: II - A patient with mild systemic                            disease. After reviewing the risks and benefits,                            the patient was deemed in satisfactory condition to  undergo the procedure.                           After obtaining informed consent, the colonoscope                            was passed under direct vision. Throughout the                            procedure, the patient's blood pressure, pulse, and                            oxygen saturations were monitored continuously. The                            CF [id number] #[id number] was introduced through  the anus                            and advanced to the cecum, identified by                            appendiceal orifice and ileocecal valve. The                            colonoscopy was performed without difficulty. The                            patient tolerated the procedure well. The quality                            of the bowel preparation was good. The ileocecal                            valve, appendiceal orifice, and rectum were                            photographed. Scope In: 2:45:24 PM Scope Out: 3:09:45 PM Scope Withdrawal Time: 0 hours 13 minutes 42 seconds  Total Procedure Duration: 0 hours 24 minutes 21 seconds  Findings:                 The digital rectal exam was normal.                           A 5 mm polyp was found in the transverse colon. The                            polyp was sessile. The polyp was removed with a                            cold snare. Resection and retrieval were complete.                           Multiple medium-mouthed and small-mouthed  diverticula were found in the sigmoid colon and                            descending colon.                           The sigmoid colon, descending colon and transverse                            colon were moderately tortuous.                           A diffuse area of mild melanosis was found in the                            entire colon.                           The retroflexed view of the distal rectum and anal                            verge was normal and showed no anal or rectal                            abnormalities. Complications:            No immediate complications. Estimated Blood Loss:     Estimated blood loss: none. Impression:               - One 5 mm polyp in the transverse colon, removed                            with a cold snare. Resected and retrieved.                           - Moderate diverticulosis in the sigmoid colon and                             in the descending colon.                           - Melanosis in the colon.                           - The distal rectum and anal verge are normal on                            retroflexion view. Recommendation:           - Patient has a contact number available for                            emergencies. The signs and symptoms of potential                            delayed complications were discussed with the  patient. Return to normal activities tomorrow.                            Written discharge instructions were provided to the                            patient.                           - Resume previous diet.                           - Continue present medications.                           - Await pathology results.                           - Query if LLQ pain is constipation related. If                            Linzess not completely effective consider Motegrity.                           - Repeat colonoscopy is recommended. The                            colonoscopy date will be determined after pathology                            results from today's exam become available for                            review. Beverley Fiedler, MD 03/07/2024 3:18:57 PM This report has been signed electronically.

## 2024-03-07 NOTE — Progress Notes (Signed)
 Called to room to assist during endoscopic procedure.  Patient ID and intended procedure confirmed with present staff. Received instructions for my participation in the procedure from the performing physician.

## 2024-03-07 NOTE — Progress Notes (Signed)
 GASTROENTEROLOGY PROCEDURE H&P NOTE   Primary Care Physician: Mliss Sax, MD    Reason for Procedure:  Follow-up diverticulitis and left lower quadrant abdominal pain  Plan:    Colonoscopy  Patient is appropriate for endoscopic procedure(s) in the ambulatory ([facility]) setting.  The nature of the procedure, as well as the risks, benefits, and alternatives were carefully and thoroughly reviewed with the patient. Ample time for discussion and questions allowed. The patient understood, was satisfied, and agreed to proceed.     HPI: [name] is a 63 y.o. female who presents for colonoscopy.  Medical history as below.  Tolerated the prep.  No recent chest pain or shortness of breath.  No abdominal pain today.  Past Medical History:  Diagnosis Date   Allergic rhinitis    Allergy    dust   Anxiety    Back pain    CLL (chronic lymphocytic leukemia) (HCC) 12/16/2021   Contact lens/glasses fitting    Depression    Endometriosis    Fatigue    GERD (gastroesophageal reflux disease)    Goals of care, counseling/discussion 12/16/2021   Hiatal hernia    History of Ostium Secundum Atrial Septal Defect 1989   Status post repair in 1989/1990   Hx of migraines    IBS (irritable bowel syndrome)    Incontinence    Intestinal volvulus (HCC) 04/2006   Loss of appetite    Monoclonal B-cell lymphocytosis    Onychomycosis    Osteopenia of femoral neck 02/24/2020   Prediabetes    Sleep apnea    uses mouthguard   Syncope 11/07/2019   Varicose veins of leg with edema, bilateral    RLE varicose vein ablation in 2008; LLE left SSV ablation September 2021    Past Surgical History:  Procedure Laterality Date   ABDOMINAL HYSTERECTOMY  2004   ASD REPAIR, SECUNDUM  1990   Dr. Rosezetta Schlatter SINUPLASTY Left    COLONOSCOPY     ENDOVENOUS ABLATION SAPHENOUS VEIN W/ LASER Left 09/03/2020   EVLA  LSSV  and stab phlebectomy  10-20   EYE SURGERY     muscle reattachment  02/2017   hysterectomy for severe endometriosis     NASAL SEPTUM SURGERY     Dr. Aurelio Jew   OPEN REDUCTION INTERNAL FIXATION (ORIF) DISTAL RADIAL FRACTURE Right 06/20/2017   Procedure: OPEN REDUCTION INTERNAL FIXATION (ORIF) DISTAL RADIAL FRACTURE;  Surgeon: Betha Loa, MD;  Location: [facility];  Service: Orthopedics;  Laterality: Right;   right ankle surgery  2010   right leg fracture with surgery     done with ankle surgery   TRANSTHORACIC ECHOCARDIOGRAM   January 2012   Normal LV size and function, EF greater than 55%. Mild LA dilation. No intra-atrial shunt with bubble study. Normal pulmonary pressures. Only trace to mild mitral and tricuspid regurgitation.   TRANSTHORACIC ECHOCARDIOGRAM  11/07/2019   EF 60 to 55%.  LVH.  GR 1 DD, with moderate LA dilation.  Relatively normal valves.  RA pressure estimated 8 mmHg.   UPPER GASTROINTESTINAL ENDOSCOPY     varicose laser vein surgery  2009    Prior to Admission medications   Medication Sig Start Date End Date Taking? Authorizing Provider  Azelastine HCl 137 MCG/SPRAY SOLN Place 1 spray into both nostrils as needed. 09/04/21  Yes [provider]  b complex vitamins capsule Take 1 capsule by mouth daily.   Yes [provider]  Calcium Carb-Cholecalciferol 600-800 MG-UNIT  TABS Take 1 tablet by mouth 2 (two) times daily.   Yes [provider]  clindamycin (CLEOCIN T) 1 % external solution Apply 1 Application topically daily. 11/13/23  Yes [provider]  cycloSPORINE (RESTASIS) 0.05 % ophthalmic emulsion INSTILL 1 DROP INTO EACH EYE TWICE DAILY 03/28/22  Yes Mliss Sax, MD  estradiol (ESTRACE) 0.1 MG/GM vaginal cream Place 1 g vaginally 2 (two) times a week. 08/30/23  Yes [provider]  fluticasone (FLONASE) 50 MCG/ACT nasal spray Place 2 sprays into both nostrils daily. 09/07/23  Yes Nche, Bonna Gains, NP  glycopyrrolate (ROBINUL) 2 MG tablet Take 1 tablet (2 mg total) by  mouth 2 (two) times daily as needed. 12/25/23  Yes Esterwood, Amy S, PA-C  Green Tea, Camellia sinensis, (GREEN TEA 600) 600 MG CAPS Take by mouth daily.   Yes [provider]  levocetirizine (XYZAL) 5 MG tablet Take 5 mg by mouth every evening.  04/27/14  Yes [provider]  linaclotide Karlene Einstein) 290 MCG CAPS capsule Take 1 capsule (290 mcg total) by mouth daily before breakfast. 11/01/23  Yes Esterwood, Amy S, PA-C  MELATONIN FAST DISSOLVE PO Take by mouth at bedtime. 11/25/2022 Takes (2) 15 mg dissolvable tabs.   Yes [provider]  metFORMIN (GLUCOPHAGE) 500 MG tablet Take 1 tablet (500 mg total) by mouth daily with breakfast. 07/17/23  Yes Mliss Sax, MD  methocarbamol (ROBAXIN-750) 750 MG tablet Take 1 tablet (750 mg total) by mouth every 6 (six) hours as needed for muscle spasms. 06/02/22  Yes Mliss Sax, MD  MIEBO 1.338 GM/ML SOLN Apply 1 drop to eye 2 (two) times daily. 01/27/24  Yes [provider]  Misc Natural Products (JOINT HEALTH PO) Take by mouth daily.   Yes [provider]  montelukast (SINGULAIR) 10 MG tablet Take 1 tablet by mouth every evening.   Yes [provider]  Multiple Vitamin (MULTIVITAMINS PO) Take 1 tablet by mouth daily.   Yes [provider]  Omega-3 Fatty Acids (FISH OIL PO) Take 500 mg by mouth daily.   Yes [provider]  OVER THE COUNTER MEDICATION daily. Nature Made Stress Relief   Yes [provider]  polyethylene glycol (MIRALAX / GLYCOLAX) packet Take 17 g by mouth daily.   Yes [provider]  QUERCETIN PO Take 1,000 mg by mouth daily.   Yes [provider]  Turmeric (QC TUMERIC COMPLEX) 500 MG CAPS Take by mouth daily.   Yes [provider]  ZINC-VITAMIN C PO Take 2 capsules by mouth daily with breakfast.   Yes [provider]  acetaminophen (TYLENOL) 500 MG tablet Take 1,000 mg by mouth every 6 (six) hours as needed for  mild pain.    [provider]  EPIPEN 2-PAK 0.3 MG/0.3ML SOAJ injection  04/01/14   [provider]  Magnesium Citrate 100 MG TABS Take by mouth daily.    [provider]  ondansetron (ZOFRAN-ODT) 4 MG disintegrating tablet Take 1 tablet (4 mg total) by mouth every 8 (eight) hours as needed for nausea or vomiting. 12/18/23   Garnette Gunner, MD  valACYclovir (VALTREX) 500 MG tablet Take 1 tablet (500 mg total) by mouth daily. 07/17/23   Mliss Sax, MD    Current Outpatient Medications  Medication Sig Dispense Refill   Azelastine HCl 137 MCG/SPRAY SOLN Place 1 spray into both nostrils as needed.     b complex vitamins capsule Take 1 capsule by mouth daily.  Calcium Carb-Cholecalciferol 600-800 MG-UNIT TABS Take 1 tablet by mouth 2 (two) times daily.     clindamycin (CLEOCIN T) 1 % external solution Apply 1 Application topically daily.     cycloSPORINE (RESTASIS) 0.05 % ophthalmic emulsion INSTILL 1 DROP INTO EACH EYE TWICE DAILY 60 each 0   estradiol (ESTRACE) 0.1 MG/GM vaginal cream Place 1 g vaginally 2 (two) times a week.     fluticasone (FLONASE) 50 MCG/ACT nasal spray Place 2 sprays into both nostrils daily. 16 g 0   glycopyrrolate (ROBINUL) 2 MG tablet Take 1 tablet (2 mg total) by mouth 2 (two) times daily as needed. 60 tablet 3   Green Tea, Camellia sinensis, (GREEN TEA 600) 600 MG CAPS Take by mouth daily.     levocetirizine (XYZAL) 5 MG tablet Take 5 mg by mouth every evening.      linaclotide (LINZESS) 290 MCG CAPS capsule Take 1 capsule (290 mcg total) by mouth daily before breakfast. 30 capsule 6   MELATONIN FAST DISSOLVE PO Take by mouth at bedtime. 11/25/2022 Takes (2) 15 mg dissolvable tabs.     metFORMIN (GLUCOPHAGE) 500 MG tablet Take 1 tablet (500 mg total) by mouth daily with breakfast. 90 tablet 3   methocarbamol (ROBAXIN-750) 750 MG tablet Take 1 tablet (750 mg total) by mouth every 6 (six) hours as needed for muscle spasms. 60 tablet  0   MIEBO 1.338 GM/ML SOLN Apply 1 drop to eye 2 (two) times daily.     Misc Natural Products (JOINT HEALTH PO) Take by mouth daily.     montelukast (SINGULAIR) 10 MG tablet Take 1 tablet by mouth every evening.     Multiple Vitamin (MULTIVITAMINS PO) Take 1 tablet by mouth daily.     Omega-3 Fatty Acids (FISH OIL PO) Take 500 mg by mouth daily.     OVER THE COUNTER MEDICATION daily. Nature Made Stress Relief     polyethylene glycol (MIRALAX / GLYCOLAX) packet Take 17 g by mouth daily.     QUERCETIN PO Take 1,000 mg by mouth daily.     Turmeric (QC TUMERIC COMPLEX) 500 MG CAPS Take by mouth daily.     ZINC-VITAMIN C PO Take 2 capsules by mouth daily with breakfast.     acetaminophen (TYLENOL) 500 MG tablet Take 1,000 mg by mouth every 6 (six) hours as needed for mild pain.     EPIPEN 2-PAK 0.3 MG/0.3ML SOAJ injection      Magnesium Citrate 100 MG TABS Take by mouth daily.     ondansetron (ZOFRAN-ODT) 4 MG disintegrating tablet Take 1 tablet (4 mg total) by mouth every 8 (eight) hours as needed for nausea or vomiting. 20 tablet 0   valACYclovir (VALTREX) 500 MG tablet Take 1 tablet (500 mg total) by mouth daily. 90 tablet 3   Current Facility-Administered Medications  Medication Dose Route Frequency Provider Last Rate Last Admin   0.9 %  sodium chloride infusion  500 mL Intravenous Once Bre Pecina, Carie Caddy, MD        Allergies as of 03/07/2024 - Review Complete 03/07/2024  Allergen Reaction Noted   Chlordiazepoxide-clidinium Other (See Comments) 04/26/2011   Codeine Nausea And Vomiting 04/23/2020   Penicillins Hives 04/23/2020    Family History  Problem Relation Age of Onset   Diabetes Mother    Osteoporosis Mother    Scoliosis Mother    Macular degeneration Mother    Clotting disorder Sister    Heart disease Sister    Lung cancer Maternal  Grandmother        with mets to the brain   Clotting disorder Maternal Grandmother    Heart disease Maternal Grandfather    Prostate cancer  Maternal Grandfather    Colon cancer Paternal Grandfather    Colon polyps Neg Hx    Esophageal cancer Neg Hx    Rectal cancer Neg Hx    Stomach cancer Neg Hx     Social History   Socioeconomic History   Marital status: Married    Spouse name: Richard   Number of children: Not on file   Years of education: Not on file   Highest education level: 12th grade  Occupational History   Occupation: remodel houses    Employer: [employer]  Tobacco Use   Smoking status: Former    Current packs/day: 0.00    Average packs/day: 1 pack/day for 10.0 years (10.0 ttl pk-yrs)    Types: Cigarettes    Start date: 12/12/1978    Quit date: 12/12/1988    Years since quitting: 35.2   Smokeless tobacco: Never  Vaping Use   Vaping status: Never Used  Substance and Sexual Activity   Alcohol use: Yes    Alcohol/week: 1.0 standard drink of alcohol    Types: 1 Glasses of wine per week    Comment: social use/wine rare   Drug use: No   Sexual activity: Yes    Partners: Male    Birth control/protection: Surgical    Comment: Hysterectomy, menarche 63yo, sexual debut 63yo  Other Topics Concern   Not on file  Social History Narrative   They have 2 children and at least two grandchildren. She does all her activities of daily living. 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 She does other days of the gym and she is able to at least 60 minutes a day 5 days a week.   She is a former smoker who quit in 1990. This was after smoking a pack a day for 10 years. She drinks social alcohol on occasion.   Social Drivers of Corporate investment banker Strain: Low Risk  (12/18/2023)   Overall Financial Resource Strain (CARDIA)    Difficulty of Paying Living Expenses: Not hard at all  Food Insecurity: No Food Insecurity (12/18/2023)   Hunger Vital Sign    Worried  About Running Out of Food in the Last Year: Never true    Ran Out of Food in the Last Year: Never true  Transportation Needs: No Transportation Needs (12/18/2023)   PRAPARE - Administrator, Civil Service (Medical): No    Lack of Transportation (Non-Medical): No  Physical Activity: Sufficiently Active (12/18/2023)   Exercise Vital Sign    Days of Exercise per Week: 4 days    Minutes of Exercise per Session: 60 min  Stress: No Stress Concern Present (12/18/2023)   [employer] of Occupational Health - Occupational Stress Questionnaire    Feeling of Stress : Only a little  Social Connections: Socially Integrated (12/18/2023)   Social Connection and Isolation Panel [NHANES]    Frequency of Communication with Friends and Family: More than three times a week    Frequency of Social Gatherings with Friends and Family: Once a week    Attends Religious Services: More than 4 times per year  Active Member of Clubs or Organizations: Yes    Attends Banker Meetings: 1 to 4 times per year    Marital Status: Married  Catering manager Violence: Not on file    Physical Exam: Vital signs in last 24 hours: @BP  (!) 120/56   Pulse (!) 48   Temp 98.2 F (36.8 C) (Temporal)   Ht 5\' 10"  (1.778 m)   Wt 163 lb (73.9 kg)   SpO2 99%   BMI 23.39 kg/m  GEN: NAD EYE: Sclerae anicteric ENT: MMM CV: Non-tachycardic Pulm: CTA b/l GI: Soft, NT/ND NEURO:  Alert & Oriented x 3   Erick Blinks, MD [facility]  03/07/2024 2:37 PM

## 2024-03-07 NOTE — Progress Notes (Signed)
 Report to PACU, RN, vss, BBS= Clear.

## 2024-03-08 ENCOUNTER — Telehealth: Payer: Self-pay

## 2024-03-08 ENCOUNTER — Other Ambulatory Visit (HOSPITAL_COMMUNITY): Payer: Self-pay

## 2024-03-08 ENCOUNTER — Telehealth: Payer: Self-pay | Admitting: *Deleted

## 2024-03-08 NOTE — Telephone Encounter (Signed)
 Pharmacy Patient Advocate Encounter   Received notification from CoverMyMeds that prior authorization for Motegrity 2MG  tablets is required/requested.   Insurance verification completed.   The patient is insured through [facility] .   Per test claim: PA required; PA submitted to above mentioned insurance via CoverMyMeds Key/confirmation #/EOC [id number] Status is pending

## 2024-03-08 NOTE — Telephone Encounter (Signed)
  Follow up Call-     03/07/2024    1:46 PM  Call back number  Post procedure Call Back phone  # [phone]  Permission to leave phone message Yes     Patient questions:  Do you have a fever, pain , or abdominal swelling? No. Pain Score  0 *  Have you tolerated food without any problems? Yes.    Have you been able to return to your normal activities? Yes.    Do you have any questions about your discharge instructions: Diet   No. Medications  No. Follow up visit  No.  Do you have questions or concerns about your Care? No.  Actions: * If pain score is 4 or above: No action needed, pain <4.

## 2024-03-11 ENCOUNTER — Other Ambulatory Visit (HOSPITAL_COMMUNITY): Payer: Self-pay

## 2024-03-11 NOTE — Telephone Encounter (Signed)
 Pharmacy Patient Advocate Encounter  Additional information has been requested from the patient's insurance in order to proceed with the prior authorization request. Requested information has been sent, or form has been filled out and faxed back to [phone]

## 2024-03-12 DIAGNOSIS — L570 Actinic keratosis: Secondary | ICD-10-CM | POA: Diagnosis not present

## 2024-03-13 DIAGNOSIS — J3089 Other allergic rhinitis: Secondary | ICD-10-CM | POA: Diagnosis not present

## 2024-03-13 LAB — SURGICAL PATHOLOGY

## 2024-03-13 NOTE — Telephone Encounter (Signed)
 Pharmacy Patient Advocate Encounter  Received notification from [facility] that Prior Authorization for  Motegrity 2MG  tablets has been DENIED.  Full denial letter will be uploaded to the media tab. See denial reason below.  This medication is not on the formulary and is approved for members who have long-term constipation of unknown causes (chronic idiopathic constipation). In this case, the member is not being treated for this condition.  PA #/Case ID/Reference #: [id number]

## 2024-03-14 ENCOUNTER — Encounter: Payer: Self-pay | Admitting: Internal Medicine

## 2024-03-14 NOTE — Telephone Encounter (Signed)
 The patient does have chronic constipation, this indication can be added.  This condition has also been on her problem list since 2020

## 2024-03-15 ENCOUNTER — Encounter: Payer: Self-pay | Admitting: Internal Medicine

## 2024-03-15 NOTE — Telephone Encounter (Signed)
 Pharmacy Patient Advocate Encounter  Information has been sent to clinical pharmacist for appeals review. It may take 5-7 days to prepare the necessary documentation to request the appeal from the insurance.

## 2024-03-15 NOTE — Telephone Encounter (Signed)
 Yes I will add CIC to her problem list as this is consistent with her symptoms as well

## 2024-03-15 NOTE — Telephone Encounter (Signed)
 Please see my last comment

## 2024-03-18 ENCOUNTER — Telehealth: Payer: Self-pay | Admitting: Pharmacist

## 2024-03-18 DIAGNOSIS — J3089 Other allergic rhinitis: Secondary | ICD-10-CM | POA: Diagnosis not present

## 2024-03-18 DIAGNOSIS — J3081 Allergic rhinitis due to animal (cat) (dog) hair and dander: Secondary | ICD-10-CM | POA: Diagnosis not present

## 2024-03-18 DIAGNOSIS — J301 Allergic rhinitis due to pollen: Secondary | ICD-10-CM | POA: Diagnosis not present

## 2024-03-18 NOTE — Telephone Encounter (Signed)
 E-Appeal has been submitted for Motegrity. Will advise when response is received, please be advised that most companies may take 30 days to make a decision. Appeal letter and supporting documentation were uploaded and submitted via the [facility] website on 03/18/2024 @10 :36am.  Thank you, Dellie Burns, PharmD Clinical Pharmacist  [facility]  Direct Dial: [phone]

## 2024-03-21 ENCOUNTER — Other Ambulatory Visit (HOSPITAL_COMMUNITY): Payer: Self-pay

## 2024-03-21 MED ORDER — TRULANCE 3 MG PO TABS: 3.0000 mg | ORAL_TABLET | Freq: Every day | ORAL | 11 refills | Status: DC

## 2024-03-21 NOTE — Telephone Encounter (Signed)
 [employer] denied the appeal for Motegrity with the following explanation:    According to the above rejection, generic Motegrity (prucalopride) is listed as an alternative but would require a PA.  The full denial letter has been uploaded under the Patient's media tab.

## 2024-03-21 NOTE — Telephone Encounter (Signed)
 Informed patient that the appeal for Motegrity has been denied. Her insurance requires her to try and fail all formulary alternatives prior to approving Motegrity. Informed patient we are send Trulance to her pharmacy. Patient verbalized understanding.  Patient states she spoke to her mother last night regarding her family medical history. She found out that her paternal grandfather had colon cancer in his 43's. Patient wanted to make you aware of her family history changes and see if this will change her 7 year colonoscopy recall. Please advise.

## 2024-03-21 NOTE — Addendum Note (Signed)
 Addended by: Jovita Kussmaul L on: 03/21/2024 11:49 AM   Modules accepted: Orders

## 2024-03-21 NOTE — Telephone Encounter (Signed)
 Please see appeal denial for Motegrity. According to the [facility] denial letter patient has to try and fail all formulary medications. Do you want me to send in the formulary suggestion which is Trulance?

## 2024-03-21 NOTE — Telephone Encounter (Signed)
 Yes please prescribe Trulance 3 mg daily Explain to patient why this medication is required

## 2024-03-22 ENCOUNTER — Telehealth: Payer: Self-pay

## 2024-03-22 ENCOUNTER — Other Ambulatory Visit (HOSPITAL_COMMUNITY): Payer: Self-pay

## 2024-03-22 NOTE — Telephone Encounter (Signed)
 Pharmacy Patient Advocate Encounter   Received notification from CoverMyMeds that prior authorization for Trulance 3MG  tablets is required/requested.   Insurance verification completed.   The patient is insured through [facility] .   Per test claim: PA required; PA submitted to above mentioned insurance via CoverMyMeds Key/confirmation #/EOC [id number] Status is pending

## 2024-03-22 NOTE — Telephone Encounter (Signed)
 Pharmacy Patient Advocate Encounter  Received notification from [facility] that Prior Authorization for Trulance 3MG  tablets has been APPROVED from 03-22-2024 to 03-22-2025   PA #/Case ID/Reference #: [id number]

## 2024-03-27 ENCOUNTER — Ambulatory Visit: Admitting: Sports Medicine

## 2024-03-27 ENCOUNTER — Ambulatory Visit
Admission: RE | Admit: 2024-03-27 | Discharge: 2024-03-27 | Disposition: A | Discharge status: Home / Self Care | Source: Ambulatory Visit | Attending: Sports Medicine | Admitting: Sports Medicine

## 2024-03-27 VITALS — BP 102/68 | Ht 70.0 in | Wt 158.0 lb

## 2024-03-27 DIAGNOSIS — M5412 Radiculopathy, cervical region: Secondary | ICD-10-CM | POA: Diagnosis not present

## 2024-03-27 MED ORDER — PREDNISONE 10 MG PO TABS: ORAL_TABLET | ORAL | 0 refills | Status: DC

## 2024-03-27 NOTE — Progress Notes (Signed)
 Subjective:   HPI: Patient is a 63 y.o. female with history of asymptomatic chronic lymphocytic leukemia (diagnosed January 2022) presents with a 2-week history of progressively worsening left arm pain. The pain is continuous, described as stabbing and burning, with intermittent tingling in the middle three fingers. Rates pain 3/10. Over the past two days, the pain has begun radiating down the arm. The patient denies any numbness or weakness. Pain is present at rest and is exacerbated by movement, including routine activities such as putting on a coat. Not particularly worse at night. No history of trauma or injury. Currently taking Tylenol (2 tablets twice daily) with temporary relief. No fever, fatigue, vision problems, night sweats, weight loss, or SOB.   Past Medical History:  Diagnosis Date   Allergic rhinitis    Allergy    dust   Anxiety    Back pain    CLL (chronic lymphocytic leukemia) (HCC) 12/16/2021   Contact lens/glasses fitting    Depression    Endometriosis    Fatigue    GERD (gastroesophageal reflux disease)    Goals of care, counseling/discussion 12/16/2021   Hiatal hernia    History of Ostium Secundum Atrial Septal Defect 1989   Status post repair in 1989/1990   Hx of migraines    IBS (irritable bowel syndrome)    Incontinence    Intestinal volvulus (HCC) 04/2006   Loss of appetite    Monoclonal B-cell lymphocytosis    Onychomycosis    Osteopenia of femoral neck 02/24/2020   Prediabetes    Sleep apnea    uses mouthguard   Syncope 11/07/2019   Varicose veins of leg with edema, bilateral    RLE varicose vein ablation in 2008; LLE left SSV ablation September 2021    Current Outpatient Medications on File Prior to Visit  Medication Sig Dispense Refill   acetaminophen (TYLENOL) 500 MG tablet Take 1,000 mg by mouth every 6 (six) hours as needed for mild pain.     Azelastine HCl 137 MCG/SPRAY SOLN Place 1 spray into both nostrils as needed.     b complex  vitamins capsule Take 1 capsule by mouth daily.     Calcium Carb-Cholecalciferol 600-800 MG-UNIT TABS Take 1 tablet by mouth 2 (two) times daily.     clindamycin (CLEOCIN T) 1 % external solution Apply 1 Application topically daily.     cycloSPORINE (RESTASIS) 0.05 % ophthalmic emulsion INSTILL 1 DROP INTO EACH EYE TWICE DAILY 60 each 0   EPIPEN 2-PAK 0.3 MG/0.3ML SOAJ injection      estradiol (ESTRACE) 0.1 MG/GM vaginal cream Place 1 g vaginally 2 (two) times a week.     fluticasone (FLONASE) 50 MCG/ACT nasal spray Place 2 sprays into both nostrils daily. 16 g 0   glycopyrrolate (ROBINUL) 2 MG tablet Take 1 tablet (2 mg total) by mouth 2 (two) times daily as needed. 60 tablet 3   Green Tea, Camellia sinensis, (GREEN TEA 600) 600 MG CAPS Take by mouth daily.     levocetirizine (XYZAL) 5 MG tablet Take 5 mg by mouth every evening.      Magnesium Citrate 100 MG TABS Take by mouth daily.     MELATONIN FAST DISSOLVE PO Take by mouth at bedtime. 11/25/2022 Takes (2) 15 mg dissolvable tabs.     metFORMIN (GLUCOPHAGE) 500 MG tablet Take 1 tablet (500 mg total) by mouth daily with breakfast. 90 tablet 3   methocarbamol (ROBAXIN-750) 750 MG tablet Take 1 tablet (750 mg total)  by mouth every 6 (six) hours as needed for muscle spasms. 60 tablet 0   MIEBO 1.338 GM/ML SOLN Apply 1 drop to eye 2 (two) times daily.     Misc Natural Products (JOINT HEALTH PO) Take by mouth daily.     montelukast (SINGULAIR) 10 MG tablet Take 1 tablet by mouth every evening.     Multiple Vitamin (MULTIVITAMINS PO) Take 1 tablet by mouth daily.     Omega-3 Fatty Acids (FISH OIL PO) Take 500 mg by mouth daily.     ondansetron (ZOFRAN-ODT) 4 MG disintegrating tablet Take 1 tablet (4 mg total) by mouth every 8 (eight) hours as needed for nausea or vomiting. 20 tablet 0   OVER THE COUNTER MEDICATION daily. Nature Made Stress Relief     Plecanatide (TRULANCE) 3 MG TABS Take 1 tablet (3 mg total) by mouth daily. 30 tablet 11    polyethylene glycol (MIRALAX / GLYCOLAX) packet Take 17 g by mouth daily.     QUERCETIN PO Take 1,000 mg by mouth daily.     Turmeric (QC TUMERIC COMPLEX) 500 MG CAPS Take by mouth daily.     valACYclovir (VALTREX) 500 MG tablet Take 1 tablet (500 mg total) by mouth daily. 90 tablet 3   ZINC-VITAMIN C PO Take 2 capsules by mouth daily with breakfast.     No current facility-administered medications on file prior to visit.    Past Surgical History:  Procedure Laterality Date   ABDOMINAL HYSTERECTOMY  2004   ASD REPAIR, SECUNDUM  1990   Dr. Rosezetta Schlatter SINUPLASTY Left    COLONOSCOPY     ENDOVENOUS ABLATION SAPHENOUS VEIN W/ LASER Left 09/03/2020   EVLA  LSSV  and stab phlebectomy  10-20   EYE SURGERY     muscle reattachment 02/2017   hysterectomy for severe endometriosis     NASAL SEPTUM SURGERY     Dr. Aurelio Jew   OPEN REDUCTION INTERNAL FIXATION (ORIF) DISTAL RADIAL FRACTURE Right 06/20/2017   Procedure: OPEN REDUCTION INTERNAL FIXATION (ORIF) DISTAL RADIAL FRACTURE;  Surgeon: Betha Loa, MD;  Location: [facility];  Service: Orthopedics;  Laterality: Right;   right ankle surgery  2010   right leg fracture with surgery     done with ankle surgery   TRANSTHORACIC ECHOCARDIOGRAM   January 2012   Normal LV size and function, EF greater than 55%. Mild LA dilation. No intra-atrial shunt with bubble study. Normal pulmonary pressures. Only trace to mild mitral and tricuspid regurgitation.   TRANSTHORACIC ECHOCARDIOGRAM  11/07/2019   EF 60 to 55%.  LVH.  GR 1 DD, with moderate LA dilation.  Relatively normal valves.  RA pressure estimated 8 mmHg.   UPPER GASTROINTESTINAL ENDOSCOPY     varicose laser vein surgery  2009    Allergies  Allergen Reactions   Chlordiazepoxide-Clidinium Other (See Comments)    "loopy"   Codeine Nausea And Vomiting    Dizziness   Penicillins Hives    BP 102/68   Ht 5\' 10"  (1.778 m)   Wt 158 lb (71.7 kg)   BMI 22.67 kg/m       No  data to display              No data to display              Objective:  Physical Exam:  Gen: NAD, comfortable in exam room  MSK:   Left arm: no swelling, bruising, or erythema. TTP along left lateral  neck, arm and over left trapezius muscle. ROM full. Strength limited on resisted flexion and empty can test. Hawkins test positive. Neer's test positive.    Neck ROM limited on flexion and extension. Pain elicited when turning side to side. Spurling test positive.  Neuro: sensation and motor function intact.    Assessment & Plan:  1. Left Cervical Radiculopathy   The patient presents with left arm pain likely of cervical radicular origin, given tenderness along the left lateral neck, arm, and trapezius, and radiation of pain with associated paresthesia in the middle fingers. Exam findings of limited ROM due to pain, reduced strength on resisted flexion and empty can testing, and normal sensation and motor function suggest a non-neurologic etiology, though cervical involvement remains a consideration. The absence of swelling, bruising, or erythema reduces concern for acute injury or localized inflammation.  Plan:  - Order cervical spine X-ray and consider MRI if symptoms persist or worsen, to evaluate for cervical impingement. - Rx for 6 day tapered course of prednisone to reduce inflammation followed by continued tylenol and ibuprofen  - Limit activities that exacerbate pain; avoid heavy lifting or overhead movement - Refer to PT for targeted cervical and shoulder stabilization exercises once acute pain improves. Did review some basic rotator cuff rehab exercises (resisted IR, ER and Jobes exercises) she can do at home/gym in the interim. - Follow up in 6 weeks, or sooner if issues arise.  Summers Buendia B. Chhetri, MS4   Patient seen and evaluated independently and agree with their assessment as above. Her left shoulder did have some positive impingement testing, but her rotator cuff  strength was full on my testing of her IR, ER, empty and full can tests without much pain. I agree with the though this is most likely cervical radiculopathy in origin, though discussed if neck pain improves and her shoulder pain persists - she should follow-up sooner and we will U/S the shoulder to evaluate for bursitis and rotator cuff pathologies. Consider subacromial injection if that is the case at follow-up.  Lin Rend, MD PGY-4, Sports Medicine Fellow [facility]

## 2024-04-03 DIAGNOSIS — J3089 Other allergic rhinitis: Secondary | ICD-10-CM | POA: Diagnosis not present

## 2024-04-09 DIAGNOSIS — Z13820 Encounter for screening for osteoporosis: Secondary | ICD-10-CM | POA: Diagnosis not present

## 2024-04-10 ENCOUNTER — Encounter: Payer: Self-pay | Admitting: Internal Medicine

## 2024-04-10 ENCOUNTER — Encounter: Payer: Self-pay | Admitting: Sports Medicine

## 2024-04-10 DIAGNOSIS — M545 Low back pain, unspecified: Secondary | ICD-10-CM | POA: Diagnosis not present

## 2024-04-10 DIAGNOSIS — M5412 Radiculopathy, cervical region: Secondary | ICD-10-CM | POA: Diagnosis not present

## 2024-04-12 DIAGNOSIS — J3089 Other allergic rhinitis: Secondary | ICD-10-CM | POA: Diagnosis not present

## 2024-04-12 DIAGNOSIS — J301 Allergic rhinitis due to pollen: Secondary | ICD-10-CM | POA: Diagnosis not present

## 2024-04-12 DIAGNOSIS — J3081 Allergic rhinitis due to animal (cat) (dog) hair and dander: Secondary | ICD-10-CM | POA: Diagnosis not present

## 2024-04-12 NOTE — Telephone Encounter (Signed)
 Would advise her she can use the glycopyrrolate  every 12 hours as needed not scheduled Would continue the MiraLAX  for now until we have [employer]  approved Any feedback on where we are with medication approval for her she has been waiting for a while?

## 2024-04-13 ENCOUNTER — Other Ambulatory Visit: Payer: Self-pay | Admitting: Physician Assistant

## 2024-04-22 DIAGNOSIS — E119 Type 2 diabetes mellitus without complications: Secondary | ICD-10-CM | POA: Diagnosis not present

## 2024-04-22 DIAGNOSIS — M545 Low back pain, unspecified: Secondary | ICD-10-CM | POA: Diagnosis not present

## 2024-04-22 DIAGNOSIS — M5412 Radiculopathy, cervical region: Secondary | ICD-10-CM | POA: Diagnosis not present

## 2024-04-24 DIAGNOSIS — J3089 Other allergic rhinitis: Secondary | ICD-10-CM | POA: Diagnosis not present

## 2024-04-24 DIAGNOSIS — J3081 Allergic rhinitis due to animal (cat) (dog) hair and dander: Secondary | ICD-10-CM | POA: Diagnosis not present

## 2024-04-24 DIAGNOSIS — J301 Allergic rhinitis due to pollen: Secondary | ICD-10-CM | POA: Diagnosis not present

## 2024-05-03 DIAGNOSIS — J3081 Allergic rhinitis due to animal (cat) (dog) hair and dander: Secondary | ICD-10-CM | POA: Diagnosis not present

## 2024-05-03 DIAGNOSIS — J301 Allergic rhinitis due to pollen: Secondary | ICD-10-CM | POA: Diagnosis not present

## 2024-05-03 DIAGNOSIS — J3089 Other allergic rhinitis: Secondary | ICD-10-CM | POA: Diagnosis not present

## 2024-05-08 ENCOUNTER — Ambulatory Visit (INDEPENDENT_AMBULATORY_CARE_PROVIDER_SITE_OTHER): Admitting: Sports Medicine

## 2024-05-08 VITALS — BP 110/72 | Ht 70.5 in | Wt 159.0 lb

## 2024-05-08 DIAGNOSIS — M545 Low back pain, unspecified: Secondary | ICD-10-CM | POA: Diagnosis not present

## 2024-05-08 DIAGNOSIS — M5412 Radiculopathy, cervical region: Secondary | ICD-10-CM

## 2024-05-08 MED ORDER — KETOROLAC TROMETHAMINE 60 MG/2ML IM SOLN
60.0000 mg | Freq: Once | INTRAMUSCULAR | Status: AC
  Administered 2024-05-08: 60 mg via INTRAMUSCULAR

## 2024-05-08 MED ORDER — METHYLPREDNISOLONE ACETATE 80 MG/ML IJ SUSP
80.0000 mg | Freq: Once | INTRAMUSCULAR | Status: AC
  Administered 2024-05-08: 80 mg via INTRAMUSCULAR

## 2024-05-08 NOTE — Progress Notes (Signed)
   PCP: Tonna Frederic, MD  SUBJECTIVE:   HPI:  Patient is a 63 y.o. female here for follow-up on neck/left arm pain.  Last seen 03/27/24 and found to have cervical radiculopathy. She notes the prednisone  brought her pain down from a 9/10 to a 4/10. She has since been doing physical therapy, home exercises, and NSAIDs but her pain has returned to an 8/10. She continues with radiating numbness and pain down the arm to digits 2-4. She has not felt like PT has made a difference over the past 6 weeks. Interval cervical spine x-rays showed degenerative disc disease, most prominently at C5-6, as well as moderate facet arthropathy.    Pertinent ROS were reviewed with the patient and found to be negative unless otherwise specified above in HPI.   PERTINENT  PMH / PSH / FH / SH:  Past Medical, Surgical, Social, and Family History Reviewed & Updated in the EMR.  Pertinent findings include:  Chronic Lymphocytic Leukemia GERD Prediabetes  Allergies  Allergen Reactions   Chlordiazepoxide-Clidinium Other (See Comments)    "loopy"   Codeine Nausea And Vomiting    Dizziness   Penicillins Hives    OBJECTIVE:  BP 110/72   Ht 5' 10.5" (1.791 m)   Wt 159 lb (72.1 kg)   BMI 22.49 kg/m   PHYSICAL EXAM:  GEN: Alert and Oriented, NAD, comfortable in exam room RESP: Unlabored respirations, symmetric chest rise PSY: normal mood, congruent affect   NECK/LEFT ARM MSK EXAM: No gross deformity, swelling, bruising. TTP along left paraspinals, trapezius.  No midline/bony TTP. FROM BUE strength 5/5, no rotator cuff weakness on flexion, abduction, IR, ER Sensation intact to light touch.   Negative Neers and Hawkins ++ spurlings. NV intact distal BUEs.   Independently reviewed cervical spine x-rays with patient from 04/10/24: Moderate degenerative disc changes at C5-C6 with facet arthropathy throughout cervical spine. No acute bony abnormalities. Assessment & Plan Cervical  radiculopathy Persistent cervical radiculopathy despite Prednisone  taper and 6+ weeks of formal physical therapy.   Plan: - Next step is to get MRI of cervical spine to evaluate for nerve impingement to help guide next steps to determine if surgical consultation is warranted vs referral for injections under fluoroscopy. - IM Depomedrol 80mg  and Toradol  60mg  in office today - Continue PT, HEP, tylenol  and advil prn - Follow-up after MRI to review results and discuss next steps. Patient's questions were answered and they are in agreement with this plan.   Lin Rend, MD PGY-4, Sports Medicine Fellow [facility]

## 2024-05-08 NOTE — Patient Instructions (Signed)
 Today you received an IM injection of depomedrol and toradol . We have ordered an MRI of your cervical spine to evaluate for impingement. Continue the tylenol , ibuprofen and physical therapy.  Let's plan to have a visit to review your MRI and next steps once we get those results.

## 2024-05-09 DIAGNOSIS — M818 Other osteoporosis without current pathological fracture: Secondary | ICD-10-CM | POA: Diagnosis not present

## 2024-05-09 DIAGNOSIS — M8580 Other specified disorders of bone density and structure, unspecified site: Secondary | ICD-10-CM | POA: Diagnosis not present

## 2024-05-10 NOTE — Addendum Note (Signed)
 Addended by: Salina Craver on: 05/10/2024 07:52 AM   Modules accepted: Level of Service

## 2024-05-14 ENCOUNTER — Encounter: Payer: Self-pay | Admitting: Hematology & Oncology

## 2024-05-14 ENCOUNTER — Ambulatory Visit
Admission: RE | Admit: 2024-05-14 | Discharge: 2024-05-14 | Disposition: A | Discharge status: Home / Self Care | Source: Ambulatory Visit | Attending: Sports Medicine | Admitting: Sports Medicine

## 2024-05-14 ENCOUNTER — Other Ambulatory Visit: Payer: Self-pay | Admitting: *Deleted

## 2024-05-14 DIAGNOSIS — M47812 Spondylosis without myelopathy or radiculopathy, cervical region: Secondary | ICD-10-CM | POA: Diagnosis not present

## 2024-05-14 DIAGNOSIS — M5412 Radiculopathy, cervical region: Secondary | ICD-10-CM

## 2024-05-14 DIAGNOSIS — C9110 Chronic lymphocytic leukemia of B-cell type not having achieved remission: Secondary | ICD-10-CM

## 2024-05-15 DIAGNOSIS — M545 Low back pain, unspecified: Secondary | ICD-10-CM | POA: Diagnosis not present

## 2024-05-15 DIAGNOSIS — M5412 Radiculopathy, cervical region: Secondary | ICD-10-CM | POA: Diagnosis not present

## 2024-05-17 ENCOUNTER — Other Ambulatory Visit: Payer: Self-pay | Admitting: Physician Assistant

## 2024-05-17 DIAGNOSIS — J301 Allergic rhinitis due to pollen: Secondary | ICD-10-CM | POA: Diagnosis not present

## 2024-05-17 DIAGNOSIS — J3081 Allergic rhinitis due to animal (cat) (dog) hair and dander: Secondary | ICD-10-CM | POA: Diagnosis not present

## 2024-05-17 DIAGNOSIS — J3089 Other allergic rhinitis: Secondary | ICD-10-CM | POA: Diagnosis not present

## 2024-05-23 DIAGNOSIS — J3089 Other allergic rhinitis: Secondary | ICD-10-CM | POA: Diagnosis not present

## 2024-05-23 DIAGNOSIS — J3081 Allergic rhinitis due to animal (cat) (dog) hair and dander: Secondary | ICD-10-CM | POA: Diagnosis not present

## 2024-05-23 DIAGNOSIS — J301 Allergic rhinitis due to pollen: Secondary | ICD-10-CM | POA: Diagnosis not present

## 2024-05-24 ENCOUNTER — Inpatient Hospital Stay (HOSPITAL_BASED_OUTPATIENT_CLINIC_OR_DEPARTMENT_OTHER): Payer: BC Managed Care – PPO | Admitting: Hematology & Oncology

## 2024-05-24 ENCOUNTER — Inpatient Hospital Stay: Payer: BC Managed Care – PPO | Attending: Hematology & Oncology | Admitting: *Deleted

## 2024-05-24 DIAGNOSIS — K5900 Constipation, unspecified: Secondary | ICD-10-CM | POA: Insufficient documentation

## 2024-05-24 DIAGNOSIS — C9110 Chronic lymphocytic leukemia of B-cell type not having achieved remission: Secondary | ICD-10-CM | POA: Insufficient documentation

## 2024-05-24 LAB — CMP (CANCER CENTER ONLY)
ALT: 15 U/L (ref 0–44)
AST: 20 U/L (ref 15–41)
Albumin: 4.5 g/dL (ref 3.5–5.0)
Alkaline Phosphatase: 43 U/L (ref 38–126)
Anion gap: 7 (ref 5–15)
BUN: 22 mg/dL (ref 8–23)
CO2: 30 mmol/L (ref 22–32)
Calcium: 9.2 mg/dL (ref 8.9–10.3)
Chloride: 102 mmol/L (ref 98–111)
Creatinine: 0.84 mg/dL (ref 0.44–1.00)
GFR, Estimated: >60 mL/min (ref >60)
Glucose, Bld: 108 mg/dL — ABNORMAL HIGH (ref 70–99)
Potassium: 3.9 mmol/L (ref 3.5–5.1)
Sodium: 139 mmol/L (ref 135–145)
Total Bilirubin: 0.6 mg/dL (ref 0.0–1.2)
Total Protein: 6.7 g/dL (ref 6.5–8.1)

## 2024-05-24 LAB — CBC WITH DIFFERENTIAL (CANCER CENTER ONLY)
Abs Immature Granulocytes: 0.04 10*3/uL (ref 0.00–0.07)
Basophils Absolute: 0.1 10*3/uL (ref 0.0–0.1)
Basophils Relative: 0 %
Eosinophils Absolute: 0.2 10*3/uL (ref 0.0–0.5)
Eosinophils Relative: 1 %
HCT: 41 % (ref 36.0–46.0)
Hemoglobin: 13.8 g/dL (ref 12.0–15.0)
Immature Granulocytes: 0 %
Lymphocytes Relative: 71 %
Lymphs Abs: 14.5 10*3/uL — ABNORMAL HIGH (ref 0.7–4.0)
MCH: 32.2 pg (ref 26.0–34.0)
MCHC: 33.7 g/dL (ref 30.0–36.0)
MCV: 95.6 fL (ref 80.0–100.0)
Monocytes Absolute: 0.8 10*3/uL (ref 0.1–1.0)
Monocytes Relative: 4 %
Neutro Abs: 5 10*3/uL (ref 1.7–7.7)
Neutrophils Relative %: 24 %
Platelet Count: 289 10*3/uL (ref 150–400)
RBC: 4.29 MIL/uL (ref 3.87–5.11)
RDW: 13 % (ref 11.5–15.5)
Smear Review: NORMAL
WBC Count: 20.6 10*3/uL — ABNORMAL HIGH (ref 4.0–10.5)
nRBC: 0.2 % (ref 0.0–0.2)

## 2024-05-24 LAB — LACTATE DEHYDROGENASE: LDH: 137 U/L (ref 98–192)

## 2024-05-24 LAB — SAVE SMEAR(SSMR), FOR PROVIDER SLIDE REVIEW

## 2024-05-24 NOTE — Progress Notes (Signed)
 Hematology and Oncology Follow Up Visit  [name] [id number] 1961-06-10 63 y.o. 05/24/2024   Principle Diagnosis:  CLL-  Stage A  --  13q-/ IVGH mutated  Current Therapy:   Observation     Interim History:  Ms. Michels is back for a follow-up.  We see her every 6 months.  As always, she is doing quite nicely.  She is still busy.  She is still working.  However, the company is trying to get her to go part-time.  This follow-up will be next year.  I know this will be a big change for her.  However, I know that she will also accommodate and do well with this..  She is exercising quite a bit.  It certainly shows that she is exercising.  She has had no problem with infections.  She has had no problems with swollen lymph nodes.  There is been no issues with nausea or vomiting.  She has had no cough or shortness of breath..    She has had a mammogram this year.     There has been no bleeding.  There is been no change in bowel or bladder habits.  She does have some constipation on occasion.  Currently, I would have to say that her performance status is ECOG 0.     Medications:  Current Outpatient Medications:    acetaminophen  (TYLENOL ) 500 MG tablet, Take 1,000 mg by mouth every 6 (six) hours as needed for mild pain., Disp: , Rfl:    Azelastine  HCl 137 MCG/SPRAY SOLN, Place 1 spray into both nostrils as needed., Disp: , Rfl:    b complex vitamins capsule, Take 1 capsule by mouth daily., Disp: , Rfl:    Calcium Carb-Cholecalciferol 600-800 MG-UNIT TABS, Take 1 tablet by mouth 2 (two) times daily., Disp: , Rfl:    clindamycin (CLEOCIN T) 1 % external solution, Apply 1 Application topically daily., Disp: , Rfl:    cycloSPORINE  (RESTASIS ) 0.05 % ophthalmic emulsion, INSTILL 1 DROP INTO EACH EYE TWICE DAILY, Disp: 60 each, Rfl: 0   EPIPEN  2-PAK 0.3 MG/0.3ML SOAJ injection, , Disp: , Rfl:    estradiol (ESTRACE) 0.1 MG/GM vaginal cream, Place 1 g vaginally 2 (two) times a week., Disp: ,  Rfl:    fluticasone  (FLONASE ) 50 MCG/ACT nasal spray, Place 2 sprays into both nostrils daily., Disp: 16 g, Rfl: 0   glycopyrrolate  (ROBINUL ) 2 MG tablet, Take 1 tablet (2 mg total) by mouth every 12 (twelve) hours as needed., Disp: 60 tablet, Rfl: 1   Green Tea, Camellia sinensis, (GREEN TEA 600) 600 MG CAPS, Take by mouth daily., Disp: , Rfl:    levocetirizine (XYZAL ) 5 MG tablet, Take 5 mg by mouth every evening. , Disp: , Rfl:    Magnesium Citrate 100 MG TABS, Take by mouth daily., Disp: , Rfl:    MELATONIN FAST DISSOLVE PO, Take by mouth at bedtime. 11/25/2022 Takes (2) 15 mg dissolvable tabs., Disp: , Rfl:    metFORMIN  (GLUCOPHAGE ) 500 MG tablet, Take 1 tablet (500 mg total) by mouth daily with breakfast., Disp: 90 tablet, Rfl: 3   methocarbamol  (ROBAXIN -750) 750 MG tablet, Take 1 tablet (750 mg total) by mouth every 6 (six) hours as needed for muscle spasms., Disp: 60 tablet, Rfl: 0   MIEBO 1.338 GM/ML SOLN, Apply 1 drop to eye 2 (two) times daily., Disp: , Rfl:    Misc Natural Products (JOINT HEALTH PO), Take by mouth daily., Disp: , Rfl:    montelukast  (  SINGULAIR ) 10 MG tablet, Take 1 tablet by mouth every evening., Disp: , Rfl:    Multiple Vitamin (MULTIVITAMINS PO), Take 1 tablet by mouth daily., Disp: , Rfl:    Omega-3 Fatty Acids (FISH OIL PO), Take 500 mg by mouth daily., Disp: , Rfl:    ondansetron  (ZOFRAN -ODT) 4 MG disintegrating tablet, Take 1 tablet (4 mg total) by mouth every 8 (eight) hours as needed for nausea or vomiting., Disp: 20 tablet, Rfl: 0   OVER THE COUNTER MEDICATION, daily. Nature Made Stress Relief, Disp: , Rfl:    Plecanatide  (TRULANCE ) 3 MG TABS, Take 1 tablet (3 mg total) by mouth daily., Disp: 30 tablet, Rfl: 11   polyethylene glycol (MIRALAX  / GLYCOLAX ) packet, Take 17 g by mouth daily., Disp: , Rfl:    QUERCETIN PO, Take 1,000 mg by mouth daily., Disp: , Rfl:    Turmeric (QC TUMERIC COMPLEX) 500 MG CAPS, Take by mouth daily., Disp: , Rfl:    valACYclovir   (VALTREX ) 500 MG tablet, Take 1 tablet (500 mg total) by mouth daily., Disp: 90 tablet, Rfl: 3   ZINC-VITAMIN C PO, Take 2 capsules by mouth daily with breakfast., Disp: , Rfl:   Allergies:  Allergies  Allergen Reactions   Chlordiazepoxide-Clidinium Other (See Comments)    loopy   Codeine Nausea And Vomiting    Dizziness   Penicillins Hives    Past Medical History, Surgical history, Social history, and Family History were reviewed and updated.  Review of Systems: Review of Systems  Constitutional: Negative.   HENT:  Negative.    Eyes: Negative.   Respiratory: Negative.    Cardiovascular: Negative.   Gastrointestinal: Negative.   Endocrine: Negative.   Genitourinary: Negative.    Musculoskeletal: Negative.   Skin: Negative.   Neurological: Negative.   Hematological: Negative.   Psychiatric/Behavioral: Negative.      Physical Exam:  Vital signs are temperature 98.  Pulse 56.  Blood pressure 99/49.  Weight is 162 pounds.  Wt Readings from Last 3 Encounters:  05/24/24 162 lb 1.9 oz (73.5 kg)  05/08/24 159 lb (72.1 kg)  03/27/24 158 lb (71.7 kg)    Physical Exam Vitals reviewed.  HENT:     Head: Normocephalic and atraumatic.   Eyes:     Pupils: Pupils are equal, round, and reactive to light.    Cardiovascular:     Rate and Rhythm: Normal rate and regular rhythm.     Heart sounds: Normal heart sounds.  Pulmonary:     Effort: Pulmonary effort is normal.     Breath sounds: Normal breath sounds.  Abdominal:     General: Bowel sounds are normal.     Palpations: Abdomen is soft.   Musculoskeletal:        General: No tenderness or deformity. Normal range of motion.     Cervical back: Normal range of motion.  Lymphadenopathy:     Cervical: No cervical adenopathy.   Skin:    General: Skin is warm and dry.     Findings: No erythema or rash.   Neurological:     Mental Status: She is alert and oriented to person, place, and time.   Psychiatric:         Behavior: Behavior normal.        Thought Content: Thought content normal.        Judgment: Judgment normal.      Lab Results  Component Value Date   WBC 20.6 (H) 05/24/2024   HGB 13.8 05/24/2024  HCT 41.0 05/24/2024   MCV 95.6 05/24/2024   PLT 289 05/24/2024     Chemistry      Component Value Date/Time   NA 139 05/24/2024 1448   K 3.9 05/24/2024 1448   CL 102 05/24/2024 1448   CO2 30 05/24/2024 1448   BUN 22 05/24/2024 1448   CREATININE 0.84 05/24/2024 1448      Component Value Date/Time   CALCIUM 9.2 05/24/2024 1448   ALKPHOS 43 05/24/2024 1448   AST 20 05/24/2024 1448   ALT 15 05/24/2024 1448   BILITOT 0.6 05/24/2024 1448      Impression and Plan: Ms. Facemire is a very charming 63 year old white female.  She has CLL.  I think this is a low risk CLL.  We have been following her now for several years.  She really has done well.  I really do not see any thing with her CLL that would suggest that she needs to be treated.  We will still plan to get her back in 6 more months.  Happy that she is able to exercise.  I know this is important for her.  I know that she will be quite busy this summer.  6/13/20253:24 PM

## 2024-05-26 ENCOUNTER — Encounter: Payer: Self-pay | Admitting: Hematology & Oncology

## 2024-05-29 ENCOUNTER — Ambulatory Visit: Admitting: Sports Medicine

## 2024-05-29 VITALS — Ht 70.5 in | Wt 160.0 lb

## 2024-05-29 DIAGNOSIS — M5412 Radiculopathy, cervical region: Secondary | ICD-10-CM | POA: Diagnosis not present

## 2024-05-29 NOTE — Progress Notes (Signed)
   PCP: Tonna Frederic, MD  SUBJECTIVE:   HPI:  Patient is a 63 y.o. female here for review of MRI c-spine.  Since her last visit she has continued PT and notes after the IM depomedrol/toradol  injection she feels she is 90% back to her baseline. Does still get radiating pain from her neck to her shoulder with certain movements and this improves with aleve, though would like to minimize NSAIDS as possible. No new concerns.  Pertinent ROS were reviewed with the patient and found to be negative unless otherwise specified above in HPI.   PERTINENT  PMH / PSH / FH / SH:  Past Medical, Surgical, Social, and Family History Reviewed & Updated in the EMR.  Pertinent findings include:  Chronic Lymphocytic Leukemia GERD Prediabetes  Allergies  Allergen Reactions   Chlordiazepoxide-Clidinium Other (See Comments)    loopy   Codeine Nausea And Vomiting    Dizziness   Penicillins Hives    OBJECTIVE:  Ht 5' 10.5 (1.791 m)   Wt 160 lb (72.6 kg)   BMI 22.63 kg/m   PHYSICAL EXAM:  GEN: Alert and Oriented, NAD, comfortable in exam room RESP: Unlabored respirations, symmetric chest rise PSY: normal mood, congruent affect   NECK MSK EXAM: No deformity, swelling or overlaying skin changes. Mild TTP along left paraspinals and trap. Rom is full. BUE is full in strength and ROM, no cuff weakness or pain with resisted ER/IR/Empty and Full can testing.  Independent review of her 05/14/24 MRI c-spine with the patient today shows mild disc protrusion at C4/5 and C5/6 without significant impingement. There is mild foraminal narrowing at C5/6 as well.   Assessment & Plan Cervical radiculopathy Interval improvement in her pain by about 90%. Is pleased with PT.  Plan: - MRI C-spine reviewed as above - Pleased with her progress with conservative management. Encouraged continuation of PT, home exercises, and posture corrections. - Discussed if she plateaus or worsens can consider referral  to PM&R for interventional fluor-guided injections, but think she will do well with the above.  - Follow-up as needed Patient's questions were answered and they are in agreement with this plan.   Lin Rend, MD PGY-4, Sports Medicine Fellow [facility]

## 2024-05-30 DIAGNOSIS — J3089 Other allergic rhinitis: Secondary | ICD-10-CM | POA: Diagnosis not present

## 2024-06-03 ENCOUNTER — Ambulatory Visit: Admitting: Family Medicine

## 2024-06-06 DIAGNOSIS — E559 Vitamin D deficiency, unspecified: Secondary | ICD-10-CM | POA: Diagnosis not present

## 2024-06-07 ENCOUNTER — Encounter: Payer: Self-pay | Admitting: Internal Medicine

## 2024-06-07 ENCOUNTER — Ambulatory Visit: Admitting: Family Medicine

## 2024-06-09 ENCOUNTER — Other Ambulatory Visit: Payer: Self-pay | Admitting: Internal Medicine

## 2024-06-11 DIAGNOSIS — J3081 Allergic rhinitis due to animal (cat) (dog) hair and dander: Secondary | ICD-10-CM | POA: Diagnosis not present

## 2024-06-11 DIAGNOSIS — J301 Allergic rhinitis due to pollen: Secondary | ICD-10-CM | POA: Diagnosis not present

## 2024-06-11 DIAGNOSIS — J3089 Other allergic rhinitis: Secondary | ICD-10-CM | POA: Diagnosis not present

## 2024-06-17 DIAGNOSIS — J029 Acute pharyngitis, unspecified: Secondary | ICD-10-CM | POA: Diagnosis not present

## 2024-07-01 DIAGNOSIS — J3081 Allergic rhinitis due to animal (cat) (dog) hair and dander: Secondary | ICD-10-CM | POA: Diagnosis not present

## 2024-07-01 DIAGNOSIS — J3089 Other allergic rhinitis: Secondary | ICD-10-CM | POA: Diagnosis not present

## 2024-07-01 DIAGNOSIS — J301 Allergic rhinitis due to pollen: Secondary | ICD-10-CM | POA: Diagnosis not present

## 2024-07-03 DIAGNOSIS — M545 Low back pain, unspecified: Secondary | ICD-10-CM | POA: Diagnosis not present

## 2024-07-03 DIAGNOSIS — M5412 Radiculopathy, cervical region: Secondary | ICD-10-CM | POA: Diagnosis not present

## 2024-07-07 ENCOUNTER — Other Ambulatory Visit: Payer: Self-pay | Admitting: Family Medicine

## 2024-07-07 DIAGNOSIS — A609 Anogenital herpesviral infection, unspecified: Secondary | ICD-10-CM

## 2024-07-09 DIAGNOSIS — H6123 Impacted cerumen, bilateral: Secondary | ICD-10-CM | POA: Diagnosis not present

## 2024-07-09 DIAGNOSIS — J329 Chronic sinusitis, unspecified: Secondary | ICD-10-CM | POA: Diagnosis not present

## 2024-07-10 DIAGNOSIS — M545 Low back pain, unspecified: Secondary | ICD-10-CM | POA: Diagnosis not present

## 2024-07-10 DIAGNOSIS — M5412 Radiculopathy, cervical region: Secondary | ICD-10-CM | POA: Diagnosis not present

## 2024-07-22 DIAGNOSIS — C9110 Chronic lymphocytic leukemia of B-cell type not having achieved remission: Secondary | ICD-10-CM | POA: Diagnosis not present

## 2024-07-22 DIAGNOSIS — J329 Chronic sinusitis, unspecified: Secondary | ICD-10-CM | POA: Diagnosis not present

## 2024-07-23 DIAGNOSIS — J329 Chronic sinusitis, unspecified: Secondary | ICD-10-CM | POA: Diagnosis not present

## 2024-08-01 ENCOUNTER — Ambulatory Visit (INDEPENDENT_AMBULATORY_CARE_PROVIDER_SITE_OTHER): Admitting: Family Medicine

## 2024-08-01 ENCOUNTER — Encounter: Payer: Self-pay | Admitting: Family Medicine

## 2024-08-01 ENCOUNTER — Ambulatory Visit: Payer: Self-pay | Admitting: Family Medicine

## 2024-08-01 VITALS — BP 102/72 | HR 67 | Temp 97.1°F | Ht 70.0 in | Wt 163.0 lb

## 2024-08-01 DIAGNOSIS — F418 Other specified anxiety disorders: Secondary | ICD-10-CM | POA: Insufficient documentation

## 2024-08-01 DIAGNOSIS — Z13220 Encounter for screening for lipoid disorders: Secondary | ICD-10-CM

## 2024-08-01 DIAGNOSIS — Z0000 Encounter for general adult medical examination without abnormal findings: Secondary | ICD-10-CM

## 2024-08-01 DIAGNOSIS — R7303 Prediabetes: Secondary | ICD-10-CM | POA: Diagnosis not present

## 2024-08-01 LAB — URINALYSIS, ROUTINE W REFLEX MICROSCOPIC
Bilirubin Urine: NEGATIVE
Hgb urine dipstick: NEGATIVE
Ketones, ur: NEGATIVE
Leukocytes,Ua: NEGATIVE
Nitrite: NEGATIVE
Specific Gravity, Urine: 1.01 (ref 1.000–1.030)
Total Protein, Urine: NEGATIVE
Urine Glucose: NEGATIVE
Urobilinogen, UA: 0.2 (ref 0.0–1.0)
pH: 7.5 (ref 5.0–8.0)

## 2024-08-01 LAB — LIPID PANEL
Cholesterol: 178 mg/dL (ref 0–200)
HDL: 52.9 mg/dL (ref >39.00)
LDL Cholesterol: 106 mg/dL — ABNORMAL HIGH (ref 0–99)
NonHDL: 125.49
Total CHOL/HDL Ratio: 3
Triglycerides: 96 mg/dL (ref 0.0–149.0)
VLDL: 19.2 mg/dL (ref 0.0–40.0)

## 2024-08-01 LAB — HEMOGLOBIN A1C: Hgb A1c MFr Bld: 5.8 % (ref 4.6–6.5)

## 2024-08-01 MED ORDER — BUPROPION HCL ER (XL) 150 MG PO TB24: 150.0000 mg | ORAL_TABLET | Freq: Every morning | ORAL | 1 refills | Status: DC

## 2024-08-01 NOTE — Progress Notes (Signed)
 Established Patient Office Visit   Subjective:  Patient ID: [name], female    DOB: January 01, 1961  Age: 63 y.o. MRN: [id number]  Chief Complaint  Patient presents with   Annual Exam    CPE. Pt is fasting.     HPI Encounter Diagnoses  Name Primary?   Healthcare maintenance Yes   Prediabetes    Screening for hyperlipidemia    Depression with anxiety    For physical and follow-up of above.  Continues exercising aerobically and lifting weights 50 minutes weekly.  Has regular dental care twice yearly.  Continues with Glucophage  for prevention of diabetes.  This has been a stressful time for her.  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  She is being politely forced out of her job.  This has led to a mild depression.  She is planning on retiring at age 6.  Things are well with her husband at home.   Review of Systems  Constitutional: Negative.   HENT: Negative.    Eyes:  Negative for blurred vision, discharge and redness.  Respiratory: Negative.    Cardiovascular: Negative.   Gastrointestinal:  Negative for abdominal pain.  Genitourinary: Negative.   Musculoskeletal: Negative.  Negative for myalgias.  Skin:  Negative for rash.  Neurological:  Negative for tingling, loss of consciousness and weakness.  Endo/Heme/Allergies:  Negative for polydipsia.     Current Outpatient Medications:    acetaminophen  (TYLENOL ) 500 MG tablet, Take 1,000 mg by mouth every 6 (six) hours as needed for mild pain., Disp: , Rfl:    Azelastine  HCl 137 MCG/SPRAY SOLN, Place 1 spray into both nostrils as needed., Disp: , Rfl:    b complex vitamins capsule, Take 1 capsule by mouth daily., Disp: , Rfl:    buPROPion  (WELLBUTRIN  XL) 150 MG 24 hr tablet, Take 1 tablet (150 mg total) by mouth every morning., Disp: 30 tablet, Rfl: 1   Calcium Carb-Cholecalciferol 600-800 MG-UNIT TABS, Take 1 tablet by mouth 2 (two) times daily., Disp: , Rfl:    clindamycin (CLEOCIN T) 1 % external solution, Apply 1 Application  topically daily., Disp: , Rfl:    EPIPEN  2-PAK 0.3 MG/0.3ML SOAJ injection, , Disp: , Rfl:    estradiol (ESTRACE) 0.1 MG/GM vaginal cream, Place 1 g vaginally 2 (two) times a week., Disp: , Rfl:    fluticasone  (FLONASE ) 50 MCG/ACT nasal spray, Place 2 sprays into both nostrils daily., Disp: 16 g, Rfl: 0   glycopyrrolate  (ROBINUL ) 2 MG tablet, TAKE 1 TABLET BY MOUTH EVERY 12 HOURS AS NEEDED, Disp: 60 tablet, Rfl: 5   Green Tea, Camellia sinensis, (GREEN TEA 600) 600 MG CAPS, Take by mouth daily., Disp: , Rfl:    levocetirizine (XYZAL ) 5 MG tablet, Take 5 mg by mouth every evening. , Disp: , Rfl:    Magnesium Citrate 100 MG TABS, Take by mouth daily., Disp: , Rfl:    MELATONIN FAST DISSOLVE PO, Take by mouth at bedtime. 11/25/2022 Takes (2) 15 mg dissolvable tabs., Disp: , Rfl:    metFORMIN  (GLUCOPHAGE ) 500 MG tablet, Take 1 tablet (500 mg total) by mouth daily with breakfast., Disp: 90 tablet, Rfl: 3   methocarbamol  (ROBAXIN -750) 750 MG tablet, Take 1 tablet (750 mg total) by mouth every 6 (six) hours as needed for muscle spasms., Disp: 60 tablet, Rfl: 0   MIEBO 1.338 GM/ML SOLN, Apply 1 drop to eye 2 (two) times daily., Disp: , Rfl:    Misc Natural Products (JOINT HEALTH PO), Take by mouth daily.,  Disp: , Rfl:    montelukast  (SINGULAIR ) 10 MG tablet, Take 1 tablet by mouth every evening., Disp: , Rfl:    Multiple Vitamin (MULTIVITAMINS PO), Take 1 tablet by mouth daily., Disp: , Rfl:    Omega-3 Fatty Acids (FISH OIL PO), Take 500 mg by mouth daily., Disp: , Rfl:    ondansetron  (ZOFRAN -ODT) 4 MG disintegrating tablet, Take 1 tablet (4 mg total) by mouth every 8 (eight) hours as needed for nausea or vomiting., Disp: 20 tablet, Rfl: 0   OVER THE COUNTER MEDICATION, daily. Nature Made Stress Relief, Disp: , Rfl:    Plecanatide  (TRULANCE ) 3 MG TABS, Take 1 tablet (3 mg total) by mouth daily., Disp: 30 tablet, Rfl: 11   polyethylene glycol (MIRALAX  / GLYCOLAX ) packet, Take 17 g by mouth daily., Disp: ,  Rfl:    QUERCETIN PO, Take 1,000 mg by mouth daily., Disp: , Rfl:    Turmeric (QC TUMERIC COMPLEX) 500 MG CAPS, Take by mouth daily., Disp: , Rfl:    valACYclovir  (VALTREX ) 500 MG tablet, Take 1 tablet by mouth once daily, Disp: 90 tablet, Rfl: 0   ZINC-VITAMIN C PO, Take 2 capsules by mouth daily with breakfast., Disp: , Rfl:    cycloSPORINE  (RESTASIS ) 0.05 % ophthalmic emulsion, INSTILL 1 DROP INTO EACH EYE TWICE DAILY (Patient not taking: Reported on 08/01/2024), Disp: 60 each, Rfl: 0   Objective:     BP 102/72 (BP Location: Right Arm, Patient Position: Sitting, Cuff Size: Normal)   Pulse 67   Temp (!) 97.1 F (36.2 C) (Temporal)   Ht 5' 10 (1.778 m)   Wt 163 lb (73.9 kg)   SpO2 98%   BMI 23.39 kg/m  Wt Readings from Last 3 Encounters:  08/01/24 163 lb (73.9 kg)  05/29/24 160 lb (72.6 kg)  05/24/24 162 lb 1.9 oz (73.5 kg)      Physical Exam Constitutional:      General: She is not in acute distress.    Appearance: Normal appearance. She is not ill-appearing, toxic-appearing or diaphoretic.  HENT:     Head: Normocephalic and atraumatic.     Right Ear: Tympanic membrane, ear canal and external ear normal.     Left Ear: Tympanic membrane, ear canal and external ear normal.     Mouth/Throat:     Mouth: Mucous membranes are moist.     Pharynx: Oropharynx is clear. No oropharyngeal exudate or posterior oropharyngeal erythema.  Eyes:     General: No scleral icterus.       Right eye: No discharge.        Left eye: No discharge.     Extraocular Movements: Extraocular movements intact.     Conjunctiva/sclera: Conjunctivae normal.     Pupils: Pupils are equal, round, and reactive to light.  Cardiovascular:     Rate and Rhythm: Normal rate and regular rhythm.  Pulmonary:     Effort: Pulmonary effort is normal. No respiratory distress.     Breath sounds: Normal breath sounds.  Abdominal:     General: Bowel sounds are normal.  Musculoskeletal:     Cervical back: No rigidity  or tenderness.  Lymphadenopathy:     Cervical: No cervical adenopathy.  Skin:    General: Skin is warm and dry.  Neurological:     Mental Status: She is alert and oriented to person, place, and time.  Psychiatric:        Mood and Affect: Mood normal.        Behavior:  Behavior normal.      No results found for any visits on 08/01/24.    The 10-year ASCVD risk score (Arnett DK, et al., 2019) is: 3.1%    Assessment & Plan:   Healthcare maintenance -     Urinalysis, Routine w reflex microscopic  Prediabetes -     Hemoglobin A1c  Screening for hyperlipidemia -     Lipid panel  Depression with anxiety -     buPROPion  HCl ER (XL); Take 1 tablet (150 mg total) by mouth every morning.  Dispense: 30 tablet; Refill: 1    Return in about 6 weeks (around 09/12/2024).  Continue exercising and healthy lifestyle.  Will start Wellbutrin .  Information was given on the medication.  Will expect some elevation in mood over the next 4 to 6 weeks.  Information was given on mindfulness based stress reduction.  Has Pap and mammogram scheduled with GYN provider in October.  Information given on health maintenance and disease mention.  Elsie Sim Lent, MD

## 2024-08-05 DIAGNOSIS — J321 Chronic frontal sinusitis: Secondary | ICD-10-CM | POA: Diagnosis not present

## 2024-08-05 DIAGNOSIS — J320 Chronic maxillary sinusitis: Secondary | ICD-10-CM | POA: Diagnosis not present

## 2024-08-05 DIAGNOSIS — J329 Chronic sinusitis, unspecified: Secondary | ICD-10-CM | POA: Diagnosis not present

## 2024-08-06 DIAGNOSIS — J3089 Other allergic rhinitis: Secondary | ICD-10-CM | POA: Diagnosis not present

## 2024-08-06 DIAGNOSIS — J301 Allergic rhinitis due to pollen: Secondary | ICD-10-CM | POA: Diagnosis not present

## 2024-08-06 DIAGNOSIS — J3081 Allergic rhinitis due to animal (cat) (dog) hair and dander: Secondary | ICD-10-CM | POA: Diagnosis not present

## 2024-08-16 DIAGNOSIS — J3081 Allergic rhinitis due to animal (cat) (dog) hair and dander: Secondary | ICD-10-CM | POA: Diagnosis not present

## 2024-08-16 DIAGNOSIS — J3089 Other allergic rhinitis: Secondary | ICD-10-CM | POA: Diagnosis not present

## 2024-08-16 DIAGNOSIS — J301 Allergic rhinitis due to pollen: Secondary | ICD-10-CM | POA: Diagnosis not present

## 2024-08-20 ENCOUNTER — Encounter: Payer: Self-pay | Admitting: Family Medicine

## 2024-08-22 DIAGNOSIS — J3089 Other allergic rhinitis: Secondary | ICD-10-CM | POA: Diagnosis not present

## 2024-08-27 DIAGNOSIS — J3089 Other allergic rhinitis: Secondary | ICD-10-CM | POA: Diagnosis not present

## 2024-09-07 DIAGNOSIS — R55 Syncope and collapse: Secondary | ICD-10-CM | POA: Diagnosis not present

## 2024-09-11 DIAGNOSIS — J3081 Allergic rhinitis due to animal (cat) (dog) hair and dander: Secondary | ICD-10-CM | POA: Diagnosis not present

## 2024-09-11 DIAGNOSIS — J3089 Other allergic rhinitis: Secondary | ICD-10-CM | POA: Diagnosis not present

## 2024-09-11 DIAGNOSIS — J301 Allergic rhinitis due to pollen: Secondary | ICD-10-CM | POA: Diagnosis not present

## 2024-09-12 ENCOUNTER — Ambulatory Visit: Admitting: Family Medicine

## 2024-09-12 ENCOUNTER — Encounter: Payer: Self-pay | Admitting: Family Medicine

## 2024-09-12 VITALS — BP 88/62 | HR 62 | Temp 96.9°F | Ht 70.0 in | Wt 164.4 lb

## 2024-09-12 DIAGNOSIS — I950 Idiopathic hypotension: Secondary | ICD-10-CM

## 2024-09-12 DIAGNOSIS — F418 Other specified anxiety disorders: Secondary | ICD-10-CM

## 2024-09-12 LAB — URINALYSIS, ROUTINE W REFLEX MICROSCOPIC
Bilirubin Urine: NEGATIVE
Hgb urine dipstick: NEGATIVE
Ketones, ur: NEGATIVE
Leukocytes,Ua: NEGATIVE
Nitrite: NEGATIVE
RBC / HPF: NONE SEEN (ref 0–?)
Specific Gravity, Urine: 1.01 (ref 1.000–1.030)
Total Protein, Urine: NEGATIVE
Urine Glucose: NEGATIVE
Urobilinogen, UA: 0.2 (ref 0.0–1.0)
WBC, UA: NONE SEEN (ref 0–?)
pH: 8 (ref 5.0–8.0)

## 2024-09-12 LAB — BASIC METABOLIC PANEL WITH GFR
BUN: 21 mg/dL (ref 6–23)
CO2: 32 meq/L (ref 19–32)
Calcium: 9.1 mg/dL (ref 8.4–10.5)
Chloride: 102 meq/L (ref 96–112)
Creatinine, Ser: 0.74 mg/dL (ref 0.40–1.20)
GFR: 85.23 mL/min (ref >60.00)
Glucose, Bld: 87 mg/dL (ref 70–99)
Potassium: 3.9 meq/L (ref 3.5–5.1)
Sodium: 142 meq/L (ref 135–145)

## 2024-09-12 MED ORDER — BUPROPION HCL ER (XL) 150 MG PO TB24: 150.0000 mg | ORAL_TABLET | Freq: Every morning | ORAL | 1 refills | Status: AC

## 2024-09-12 NOTE — Progress Notes (Signed)
 Established Patient Office Visit   Subjective:  Patient ID: [name], female    DOB: 04-30-1961  Age: 63 y.o. MRN: [id number]  Chief Complaint  Patient presents with   Medical Management of Chronic Issues    6 week follow up. Pt states improvement with new medications. Pt wanted to inform Dr. Berneta that on Sunday her BP got very low 84/46, pt went to [facility] and she will be seeing her cardiologist. This week.     HPI Encounter Diagnoses  Name Primary?   Idiopathic hypotension Yes   Depression with anxiety    For follow-up of an acute episode of lightheadedness that she experienced this past Saturday morning breakfast.  She was seen at urgent care.  Blood pressure initially measured in the low high 80s over 60s.  Orthostatics taken at that time were normal.  Not tilt.  EKG showed normal sinus rhythm with a rate of 75.  CBC and BMP were stable and/or normal.  White count was 15 which is consistent with her history of dyscrasia.  Wellbutrin  has been quite helpful.  Will start part-time work soon.   Review of Systems  Constitutional: Negative.   HENT: Negative.    Eyes:  Negative for blurred vision, discharge and redness.  Respiratory: Negative.    Cardiovascular: Negative.   Gastrointestinal:  Negative for abdominal pain.  Genitourinary: Negative.   Musculoskeletal: Negative.  Negative for myalgias.  Skin:  Negative for rash.  Neurological:  Negative for tingling, loss of consciousness and weakness.  Endo/Heme/Allergies:  Negative for polydipsia.     Current Outpatient Medications:    acetaminophen  (TYLENOL ) 500 MG tablet, Take 1,000 mg by mouth every 6 (six) hours as needed for mild pain., Disp: , Rfl:    Azelastine  HCl 137 MCG/SPRAY SOLN, Place 1 spray into both nostrils as needed., Disp: , Rfl:    b complex vitamins capsule, Take 1 capsule by mouth daily., Disp: , Rfl:    buPROPion  (WELLBUTRIN  XL) 150 MG 24 hr tablet, Take 1 tablet (150 mg total) by mouth every  morning., Disp: 90 tablet, Rfl: 1   Calcium Carb-Cholecalciferol 600-800 MG-UNIT TABS, Take 1 tablet by mouth 2 (two) times daily., Disp: , Rfl:    clindamycin (CLEOCIN T) 1 % external solution, Apply 1 Application topically daily., Disp: , Rfl:    EPIPEN  2-PAK 0.3 MG/0.3ML SOAJ injection, , Disp: , Rfl:    estradiol (ESTRACE) 0.1 MG/GM vaginal cream, Place 1 g vaginally 2 (two) times a week., Disp: , Rfl:    fluticasone  (FLONASE ) 50 MCG/ACT nasal spray, Place 2 sprays into both nostrils daily., Disp: 16 g, Rfl: 0   glycopyrrolate  (ROBINUL ) 2 MG tablet, TAKE 1 TABLET BY MOUTH EVERY 12 HOURS AS NEEDED, Disp: 60 tablet, Rfl: 5   Green Tea, Camellia sinensis, (GREEN TEA 600) 600 MG CAPS, Take by mouth daily., Disp: , Rfl:    levocetirizine (XYZAL ) 5 MG tablet, Take 5 mg by mouth every evening. , Disp: , Rfl:    Magnesium Citrate 100 MG TABS, Take by mouth daily., Disp: , Rfl:    MELATONIN FAST DISSOLVE PO, Take by mouth at bedtime. 11/25/2022 Takes (2) 15 mg dissolvable tabs., Disp: , Rfl:    metFORMIN  (GLUCOPHAGE ) 500 MG tablet, Take 1 tablet (500 mg total) by mouth daily with breakfast., Disp: 90 tablet, Rfl: 3   methocarbamol  (ROBAXIN -750) 750 MG tablet, Take 1 tablet (750 mg total) by mouth every 6 (six) hours as needed for muscle spasms., Disp:  60 tablet, Rfl: 0   MIEBO 1.338 GM/ML SOLN, Apply 1 drop to eye 2 (two) times daily., Disp: , Rfl:    Misc Natural Products (JOINT HEALTH PO), Take by mouth daily., Disp: , Rfl:    montelukast  (SINGULAIR ) 10 MG tablet, Take 1 tablet by mouth every evening., Disp: , Rfl:    Multiple Vitamin (MULTIVITAMINS PO), Take 1 tablet by mouth daily., Disp: , Rfl:    Omega-3 Fatty Acids (FISH OIL PO), Take 500 mg by mouth daily., Disp: , Rfl:    ondansetron  (ZOFRAN -ODT) 4 MG disintegrating tablet, Take 1 tablet (4 mg total) by mouth every 8 (eight) hours as needed for nausea or vomiting., Disp: 20 tablet, Rfl: 0   OVER THE COUNTER MEDICATION, daily. Nature Made  Stress Relief, Disp: , Rfl:    Plecanatide  (TRULANCE ) 3 MG TABS, Take 1 tablet (3 mg total) by mouth daily., Disp: 30 tablet, Rfl: 11   polyethylene glycol (MIRALAX  / GLYCOLAX ) packet, Take 17 g by mouth daily., Disp: , Rfl:    QUERCETIN PO, Take 1,000 mg by mouth daily., Disp: , Rfl:    Turmeric (QC TUMERIC COMPLEX) 500 MG CAPS, Take by mouth daily., Disp: , Rfl:    valACYclovir  (VALTREX ) 500 MG tablet, Take 1 tablet by mouth once daily, Disp: 90 tablet, Rfl: 0   ZINC-VITAMIN C PO, Take 2 capsules by mouth daily with breakfast., Disp: , Rfl:    Objective:     BP (!) 88/62 (BP Location: Left Arm, Patient Position: Sitting, Cuff Size: Normal)   Pulse 62   Temp (!) 96.9 F (36.1 C) (Temporal)   Ht 5' 10 (1.778 m)   Wt 164 lb 6.4 oz (74.6 kg)   SpO2 98%   BMI 23.59 kg/m  BP Readings from Last 3 Encounters:  09/12/24 (!) 88/62  08/01/24 102/72  05/24/24 (!) 99/49   Wt Readings from Last 3 Encounters:  09/12/24 164 lb 6.4 oz (74.6 kg)  08/01/24 163 lb (73.9 kg)  05/29/24 160 lb (72.6 kg)      Physical Exam Constitutional:      General: She is not in acute distress.    Appearance: Normal appearance. She is not ill-appearing, toxic-appearing or diaphoretic.  HENT:     Head: Normocephalic and atraumatic.     Right Ear: External ear normal.     Left Ear: External ear normal.     Mouth/Throat:     Mouth: Mucous membranes are moist.     Pharynx: Oropharynx is clear. No oropharyngeal exudate or posterior oropharyngeal erythema.  Eyes:     General: No scleral icterus.       Right eye: No discharge.        Left eye: No discharge.     Extraocular Movements: Extraocular movements intact.     Conjunctiva/sclera: Conjunctivae normal.     Pupils: Pupils are equal, round, and reactive to light.  Cardiovascular:     Rate and Rhythm: Normal rate and regular rhythm.  Pulmonary:     Effort: Pulmonary effort is normal. No respiratory distress.     Breath sounds: Normal breath sounds. No  wheezing or rales.  Abdominal:     General: Bowel sounds are normal.  Musculoskeletal:     Cervical back: No rigidity or tenderness.  Skin:    General: Skin is warm and dry.  Neurological:     Mental Status: She is alert and oriented to person, place, and time.  Psychiatric:  Mood and Affect: Mood normal.        Behavior: Behavior normal.      No results found for any visits on 09/12/24.    The ASCVD Risk score (Arnett DK, et al., 2019) failed to calculate for the following reasons:   The valid systolic blood pressure range is 90 to 200 mmHg    Assessment & Plan:   Idiopathic hypotension -     Urinalysis, Routine w reflex microscopic -     Basic metabolic panel with GFR  Depression with anxiety -     buPROPion  HCl ER (XL); Take 1 tablet (150 mg total) by mouth every morning.  Dispense: 90 tablet; Refill: 1    Return in about 8 weeks (around 11/07/2024).  She has follow-up with cardiology for history of atrial septal defect repair in a few weeks.  Continue hydrating well.  Encouraged more liberal use of sodium.  Elsie Sim Lent, MD

## 2024-09-13 ENCOUNTER — Ambulatory Visit: Payer: Self-pay | Admitting: Family Medicine

## 2024-09-17 DIAGNOSIS — H1045 Other chronic allergic conjunctivitis: Secondary | ICD-10-CM | POA: Diagnosis not present

## 2024-09-17 DIAGNOSIS — J301 Allergic rhinitis due to pollen: Secondary | ICD-10-CM | POA: Diagnosis not present

## 2024-09-17 DIAGNOSIS — J3081 Allergic rhinitis due to animal (cat) (dog) hair and dander: Secondary | ICD-10-CM | POA: Diagnosis not present

## 2024-09-17 DIAGNOSIS — J3089 Other allergic rhinitis: Secondary | ICD-10-CM | POA: Diagnosis not present

## 2024-09-17 DIAGNOSIS — R052 Subacute cough: Secondary | ICD-10-CM | POA: Diagnosis not present

## 2024-09-18 ENCOUNTER — Ambulatory Visit: Attending: Cardiology | Admitting: Cardiology

## 2024-09-18 ENCOUNTER — Encounter: Payer: Self-pay | Admitting: Cardiology

## 2024-09-18 VITALS — BP 100/62 | HR 59 | Ht 70.5 in | Wt 165.8 lb

## 2024-09-18 DIAGNOSIS — Q2111 Secundum atrial septal defect: Secondary | ICD-10-CM | POA: Diagnosis not present

## 2024-09-18 DIAGNOSIS — R55 Syncope and collapse: Secondary | ICD-10-CM | POA: Diagnosis not present

## 2024-09-18 DIAGNOSIS — I499 Cardiac arrhythmia, unspecified: Secondary | ICD-10-CM | POA: Diagnosis not present

## 2024-09-18 DIAGNOSIS — K581 Irritable bowel syndrome with constipation: Secondary | ICD-10-CM

## 2024-09-18 DIAGNOSIS — R7303 Prediabetes: Secondary | ICD-10-CM | POA: Diagnosis not present

## 2024-09-18 DIAGNOSIS — R002 Palpitations: Secondary | ICD-10-CM

## 2024-09-18 DIAGNOSIS — I950 Idiopathic hypotension: Secondary | ICD-10-CM

## 2024-09-18 NOTE — Patient Instructions (Signed)
 Other Instructions    If symptoms return - dizziness or feeling like you are going to pass out -- HYDRATE  -AND EAT and DRINK SOMETHING   Medication Instructions:   No changes  *If you need a refill on your cardiac medications before your next appointment, please call your pharmacy*   Lab Work: Not needed    Testing/Procedures:  Not needed  Follow-Up: At [facility], you and your health needs are our priority.  As part of our continuing mission to provide you with exceptional heart care, we have created designated Provider Care Teams.  These Care Teams include your primary Cardiologist (physician) and Advanced Practice Providers (APPs -  Physician Assistants and Nurse Practitioners) who all work together to provide you with the care you need, when you need it.     Your next appointment:   12 month(s)  The format for your next appointment:   In Person  Provider:   Alm Clay, MD   Other Instructions    If symptoms return - dizziness or feeling like you are going to pass out -- HYDRATE  -AND EAT and DRINK SOMETHING

## 2024-09-18 NOTE — Progress Notes (Signed)
 Cardiology Office Note:  .   Date:  09/21/2024  ID:  [name], DOB 1961-02-09, MRN [id number] PCP: Berneta Elsie Sayre, MD  [facility] Providers Cardiologist:  Alm Clay, MD     Chief Complaint  Patient presents with   Follow-up    Was due for annual follow-up, but referred back because of syncope and collapse.    Patient Profile: .     [name] is a  63 y.o. female with a PMH noted below who presents here for annual f/u and evaluation of low blood pressures/near syncope at the request of Berneta Elsie Candace,*.  PMH: Atrial Septal Defect s/p repair in 1989, varicose veins status post SSV ablation, asymptomatic bradycardia, vasovagal syncope and CLL.  Fam hx of CAD: sister CAD in her 36s (unhealthy lifestyle)       [name] was last seen on 10/01/2023 as a 43-month follow up after echocardiogram to reevaluate.  History of ASD repair.  Still noted intermittent heart flutters and lightheadedness but very brief and lasting few seconds.  Maybe once twice a month.  She also noted fluttering when she is lying down but did not feel like.  Was concerned about possibility of atrial fibrillation.  Echo had been ordered.  Noted to have borderline hypotension.  Decided to avoid beta-blocker.  Recommend adequate hydration.  Avoiding dehydration.  Recommend foot elevation and support socks for foot for leg swelling.  Discussed big maneuvers and adequate hydration for palpitations.  Subjective  Discussed the use of AI scribe software for clinical note transcription with the patient, who gave verbal consent to proceed.  History of Present Illness [name] is a 63 year old female with vasovagal syncope who presents with a near-syncopal episode.  She experienced a near-syncopal episode two Saturdays ago while cooking breakfast, feeling dizzy and nearly losing consciousness. She managed to sit down and lower her head. The episode occurred  after receiving COVID and flu vaccines the previous night, which she suspects may have contributed. Her blood pressure at the time was 84/46 mmHg, which she could not elevate despite efforts.  She has a history of passing out four years ago, leading to hospitalization, but has not had similar episodes until the recent event. Her baseline blood pressure is typically low, ranging from 86-90/60-65 mmHg, but not as low as during the recent episode.  Following the episode, she visited a walk-in clinic where an EKG and orthostatic blood pressure measurements were performed. By the time of the clinic visit, her blood pressure had improved to 90/60 mmHg. No heart racing, skipping, shortness of breath, or chest pain occurred during the episode.  She maintains an active lifestyle, exercising four times a week, and does not usually experience dizziness or lightheadedness during physical activity. She felt better after eating and drinking following the episode, although recovery took some time.  Recent lab work included BUN, creatinine, hemoglobin, and electrolytes. She is not on any medication for blood pressure but takes metformin  for prediabetes and fish oil for cholesterol management.  She is planning to reduce her work hours to part-time in January and is currently working on two large projects. She ensures to stay hydrated, especially after receiving vaccines, and does not consume alcohol.     Objective   She is not on any cardiac medications.  Studies Reviewed: SABRA   EKG Interpretation Date/Time:  Wednesday September 18 2024 10:32:52 EDT Ventricular Rate:  59 PR Interval:  186 QRS Duration:  96 QT Interval:  450 QTC Calculation: 445 R Axis:   62  Text Interpretation: Sinus bradycardia When compared with ECG of 13-Sep-2023 12:12, No significant change was found Confirmed by Anner Lenis ([id number]) on 09/18/2024 10:52:56 AM    Results LABS BUN: 23 (09/08/2024) Creatinine: 0.65  (09/08/2024) Hemoglobin: 13.9 (09/08/2024) Potassium: 3.9 (09/08/2024) LDL: 106 (07/2024) Triglycerides: 96 (07/2024) Total Cholesterol: 170 (07/2024) HDL: 52 (07/2024) A1c: 5.8 (07/2024)  DIAGNOSTIC EKG: Normal Echocardiogram: Atrial septal defect repair appeared satisfactory with no residual shunting noted.  EF 60 to 65%.  Normal LV function.  No RWMA.  Normal diastolic parameters.  Normal RV size and function with normal RVP and RAP.  Mild LA dilation.  Normal valves.  (01/2024)   Risk Assessment/Calculations:          Physical Exam:   VS:  BP 100/62   Pulse (!) 59   Ht 5' 10.5 (1.791 m)   Wt 165 lb 12.8 oz (75.2 kg)   SpO2 97%   BMI 23.45 kg/m    Wt Readings from Last 3 Encounters:  09/18/24 165 lb 12.8 oz (75.2 kg)  09/12/24 164 lb 6.4 oz (74.6 kg)  08/01/24 163 lb (73.9 kg)     GEN: Well nourished, well groomed in no acute distress; healthy-appearing NECK: No JVD; No carotid bruits CARDIAC: Normal S1, S2; RRR, no murmurs, rubs, gallops RESPIRATORY:  Clear to auscultation without rales, wheezing or rhonchi ; nonlabored, good air movement. ABDOMEN: Soft, non-tender, non-distended EXTREMITIES:  No edema; No deformity     ASSESSMENT AND PLAN: .    Problem List Items Addressed This Visit       Cardiology Problems   ATRIAL SEPTAL DEFECT - Status Post Repair (Chronic)   Echocardiogram was not done until February of this year.  Looked good.  No residual ASD. Would probably not need to recheck an echocardiogram unless symptoms change.      Hypotension (Chronic)   Hi she is still hypotensive with very low blood pressure today.  We talked about potentially using midodrine as an option, for now we will continue to encourage adequate hydration and nutrition.  Low threshold to consider midodrine.  Would probably avoid Florinef.        Other   IBS (irritable bowel syndrome)   IBS may contribute to vasovagal episodes. Hectic lifestyle may exacerbate symptoms. -  Continue Trulance  as needed.      Irregular heart beat (Chronic)   She still has intermittent palpitations but nothing prolonged.  This was not the etiology for her syncope.  She had no symptoms of palpitations when that happened.      Relevant Orders   EKG 12-Lead (Completed)   Prediabetes (Chronic)   A1c at 5.8%, indicating prediabetes. - Continue management with metformin . => Ensure that she is not having GI upset as a side effect of metformin  which would potentially exacerbate dehydration       Relevant Orders   EKG 12-Lead (Completed)   Vasovagal syncope - Primary (Chronic)   Vasovagal syncope with orthostatic hypotension Recent near syncope likely related to vaccinations. Low blood pressure noted. Symptoms resolved with hydration and rest. No arrhythmia or heart block on EKG. - Advise hydration with water or electrolyte solutions during episodes. - Encourage liberal salt intake to maintain blood pressure. - Recommend drinking water upon waking. - Advise eating when dizzy or nauseated. - Reassure no further cardiac monitoring needed unless symptoms change.  Follow-Up: Return in about 1 year (around 09/18/2025) for [employer], Routine follow up with me.  I spent 43 minutes in the care of Heron VEAR Console today including reviewing outside labs from PCP via scanned notes (1 minute), reviewing studies (echo reviewed-2 minutes), face to face time discussing treatment options (24 minutes), reviewing records from visits with PCP and urgent care (6 minutes), 10 minutes dictating, and documenting in the encounter.      Signed, Alm MICAEL Clay, MD, MS Alm Clay, M.D., M.S. Interventional Cardiologist  [facility] Pager # [phone]

## 2024-09-20 ENCOUNTER — Encounter: Payer: Self-pay | Admitting: Family Medicine

## 2024-09-20 DIAGNOSIS — E538 Deficiency of other specified B group vitamins: Secondary | ICD-10-CM

## 2024-09-21 ENCOUNTER — Encounter: Payer: Self-pay | Admitting: Cardiology

## 2024-09-21 DIAGNOSIS — I499 Cardiac arrhythmia, unspecified: Secondary | ICD-10-CM | POA: Insufficient documentation

## 2024-09-21 NOTE — Assessment & Plan Note (Signed)
 She still has intermittent palpitations but nothing prolonged.  This was not the etiology for her syncope.  She had no symptoms of palpitations when that happened.

## 2024-09-21 NOTE — Assessment & Plan Note (Signed)
 A1c at 5.8%, indicating prediabetes. - Continue management with metformin . => Ensure that she is not having GI upset as a side effect of metformin  which would potentially exacerbate dehydration

## 2024-09-21 NOTE — Assessment & Plan Note (Signed)
 Hi she is still hypotensive with very low blood pressure today.  We talked about potentially using midodrine as an option, for now we will continue to encourage adequate hydration and nutrition.  Low threshold to consider midodrine.  Would probably avoid Florinef.

## 2024-09-21 NOTE — Assessment & Plan Note (Signed)
 IBS may contribute to vasovagal episodes. Hectic lifestyle may exacerbate symptoms. - Continue Trulance  as needed.

## 2024-09-21 NOTE — Assessment & Plan Note (Signed)
 Vasovagal syncope with orthostatic hypotension Recent near syncope likely related to vaccinations. Low blood pressure noted. Symptoms resolved with hydration and rest. No arrhythmia or heart block on EKG. - Advise hydration with water or electrolyte solutions during episodes. - Encourage liberal salt intake to maintain blood pressure. - Recommend drinking water upon waking. - Advise eating when dizzy or nauseated. - Reassure no further cardiac monitoring needed unless symptoms change.

## 2024-09-21 NOTE — Assessment & Plan Note (Signed)
 Echocardiogram was not done until February of this year.  Looked good.  No residual ASD. Would probably not need to recheck an echocardiogram unless symptoms change.

## 2024-09-25 ENCOUNTER — Other Ambulatory Visit (INDEPENDENT_AMBULATORY_CARE_PROVIDER_SITE_OTHER)

## 2024-09-25 DIAGNOSIS — E538 Deficiency of other specified B group vitamins: Secondary | ICD-10-CM | POA: Diagnosis not present

## 2024-09-25 LAB — VITAMIN B12: Vitamin B-12: 730 pg/mL (ref 211–911)

## 2024-09-27 DIAGNOSIS — J3089 Other allergic rhinitis: Secondary | ICD-10-CM | POA: Diagnosis not present

## 2024-09-27 DIAGNOSIS — J3081 Allergic rhinitis due to animal (cat) (dog) hair and dander: Secondary | ICD-10-CM | POA: Diagnosis not present

## 2024-09-27 DIAGNOSIS — J301 Allergic rhinitis due to pollen: Secondary | ICD-10-CM | POA: Diagnosis not present

## 2024-10-01 DIAGNOSIS — J3089 Other allergic rhinitis: Secondary | ICD-10-CM | POA: Diagnosis not present

## 2024-10-02 ENCOUNTER — Other Ambulatory Visit: Payer: Self-pay | Admitting: Family Medicine

## 2024-10-02 DIAGNOSIS — R635 Abnormal weight gain: Secondary | ICD-10-CM

## 2024-10-02 DIAGNOSIS — J3089 Other allergic rhinitis: Secondary | ICD-10-CM | POA: Diagnosis not present

## 2024-10-02 DIAGNOSIS — J301 Allergic rhinitis due to pollen: Secondary | ICD-10-CM | POA: Diagnosis not present

## 2024-10-02 DIAGNOSIS — J3081 Allergic rhinitis due to animal (cat) (dog) hair and dander: Secondary | ICD-10-CM | POA: Diagnosis not present

## 2024-10-02 DIAGNOSIS — R7309 Other abnormal glucose: Secondary | ICD-10-CM

## 2024-10-05 ENCOUNTER — Other Ambulatory Visit: Payer: Self-pay | Admitting: Family Medicine

## 2024-10-05 DIAGNOSIS — A609 Anogenital herpesviral infection, unspecified: Secondary | ICD-10-CM

## 2024-10-07 NOTE — Telephone Encounter (Signed)
 Requesting: valACYclovir  HCl 500 MG Oral Tablet  Last Visit: 09/12/2024 Next Visit: 11/11/2024 Last Refill: 07/08/2024  Please Advise

## 2024-10-10 DIAGNOSIS — L814 Other melanin hyperpigmentation: Secondary | ICD-10-CM | POA: Diagnosis not present

## 2024-10-10 DIAGNOSIS — L209 Atopic dermatitis, unspecified: Secondary | ICD-10-CM | POA: Diagnosis not present

## 2024-10-10 DIAGNOSIS — L578 Other skin changes due to chronic exposure to nonionizing radiation: Secondary | ICD-10-CM | POA: Diagnosis not present

## 2024-10-10 DIAGNOSIS — D225 Melanocytic nevi of trunk: Secondary | ICD-10-CM | POA: Diagnosis not present

## 2024-10-11 DIAGNOSIS — J3081 Allergic rhinitis due to animal (cat) (dog) hair and dander: Secondary | ICD-10-CM | POA: Diagnosis not present

## 2024-10-11 DIAGNOSIS — J301 Allergic rhinitis due to pollen: Secondary | ICD-10-CM | POA: Diagnosis not present

## 2024-10-11 DIAGNOSIS — J3089 Other allergic rhinitis: Secondary | ICD-10-CM | POA: Diagnosis not present

## 2024-10-17 DIAGNOSIS — Z1231 Encounter for screening mammogram for malignant neoplasm of breast: Secondary | ICD-10-CM | POA: Diagnosis not present

## 2024-10-17 DIAGNOSIS — Z6823 Body mass index (BMI) 23.0-23.9, adult: Secondary | ICD-10-CM | POA: Diagnosis not present

## 2024-10-17 DIAGNOSIS — Z01419 Encounter for gynecological examination (general) (routine) without abnormal findings: Secondary | ICD-10-CM | POA: Diagnosis not present

## 2024-10-21 ENCOUNTER — Encounter: Payer: Self-pay | Admitting: Family Medicine

## 2024-10-22 DIAGNOSIS — J3081 Allergic rhinitis due to animal (cat) (dog) hair and dander: Secondary | ICD-10-CM | POA: Diagnosis not present

## 2024-10-22 DIAGNOSIS — J3089 Other allergic rhinitis: Secondary | ICD-10-CM | POA: Diagnosis not present

## 2024-10-22 DIAGNOSIS — J301 Allergic rhinitis due to pollen: Secondary | ICD-10-CM | POA: Diagnosis not present

## 2024-10-28 ENCOUNTER — Encounter: Payer: Self-pay | Admitting: Family Medicine

## 2024-10-31 DIAGNOSIS — J3089 Other allergic rhinitis: Secondary | ICD-10-CM | POA: Diagnosis not present

## 2024-10-31 DIAGNOSIS — J301 Allergic rhinitis due to pollen: Secondary | ICD-10-CM | POA: Diagnosis not present

## 2024-10-31 DIAGNOSIS — J3081 Allergic rhinitis due to animal (cat) (dog) hair and dander: Secondary | ICD-10-CM | POA: Diagnosis not present

## 2024-11-05 ENCOUNTER — Ambulatory Visit: Admitting: Family Medicine

## 2024-11-05 ENCOUNTER — Encounter: Payer: Self-pay | Admitting: Family Medicine

## 2024-11-05 VITALS — BP 116/78 | HR 62 | Temp 98.6°F | Ht 70.0 in | Wt 167.4 lb

## 2024-11-05 DIAGNOSIS — J301 Allergic rhinitis due to pollen: Secondary | ICD-10-CM | POA: Diagnosis not present

## 2024-11-05 DIAGNOSIS — R7303 Prediabetes: Secondary | ICD-10-CM | POA: Diagnosis not present

## 2024-11-05 DIAGNOSIS — J3081 Allergic rhinitis due to animal (cat) (dog) hair and dander: Secondary | ICD-10-CM | POA: Diagnosis not present

## 2024-11-05 DIAGNOSIS — J3089 Other allergic rhinitis: Secondary | ICD-10-CM | POA: Diagnosis not present

## 2024-11-05 DIAGNOSIS — Z856 Personal history of leukemia: Secondary | ICD-10-CM | POA: Diagnosis not present

## 2024-11-05 NOTE — Progress Notes (Signed)
 Established Patient Office Visit   Subjective:  Patient ID: [name], female    DOB: [birth year]  Age: 63 y.o. MRN: [id number]  Chief Complaint  Patient presents with   Medical Management of Chronic Issues    Pt presents today for a 8 week f/u for her hypotension. Needs to get a handle on her weight bc it is going up     HPI Encounter Diagnoses  Name Primary?   Prediabetes Yes   Personal history of CLL (chronic lymphocytic leukemia)    For follow-up of hypotension and above.  Hypotension has resolved.  She is feeling much better.  She is exercising for at least 30 minutes daily.  She is about to go part-time at her work.  Hopes to work for few more years.  She is planning on focusing on her health and wellbeing.  She has follow-up with oncology on December 8.  Will start Medicare in the next few months.   Review of Systems  Constitutional: Negative.   HENT: Negative.    Eyes:  Negative for blurred vision, discharge and redness.  Respiratory: Negative.    Cardiovascular: Negative.   Gastrointestinal:  Negative for abdominal pain.  Genitourinary: Negative.   Musculoskeletal: Negative.  Negative for myalgias.  Skin:  Negative for rash.  Neurological:  Negative for tingling, loss of consciousness and weakness.  Endo/Heme/Allergies:  Negative for polydipsia.      11/05/2024    9:18 AM 08/01/2024    9:20 AM 07/17/2023    8:52 AM  Depression screen PHQ 2/9  Decreased Interest 0 1 0  Down, Depressed, Hopeless 0 1 0  PHQ - 2 Score 0 2 0  Altered sleeping  0 0  Tired, decreased energy  1 0  Change in appetite  1 0  Feeling bad or failure about yourself   0 0  Trouble concentrating  0 0  Moving slowly or fidgety/restless  0 0  Suicidal thoughts  0 0  PHQ-9 Score  4  0   Difficult doing work/chores  Somewhat difficult Not difficult at all     Data saved with a previous flowsheet row definition       Current Outpatient Medications:    acetaminophen  (TYLENOL ) 500  MG tablet, Take 1,000 mg by mouth every 6 (six) hours as needed for mild pain., Disp: , Rfl:    Azelastine  HCl 137 MCG/SPRAY SOLN, Place 1 spray into both nostrils as needed., Disp: , Rfl:    b complex vitamins capsule, Take 1 capsule by mouth daily., Disp: , Rfl:    buPROPion  (WELLBUTRIN  XL) 150 MG 24 hr tablet, Take 1 tablet (150 mg total) by mouth every morning., Disp: 90 tablet, Rfl: 1   Calcium Carb-Cholecalciferol 600-800 MG-UNIT TABS, Take 1 tablet by mouth 2 (two) times daily., Disp: , Rfl:    clindamycin (CLEOCIN T) 1 % external solution, Apply 1 Application topically daily., Disp: , Rfl:    EPIPEN  2-PAK 0.3 MG/0.3ML SOAJ injection, , Disp: , Rfl:    estradiol (ESTRACE) 0.1 MG/GM vaginal cream, Place 1 g vaginally 2 (two) times a week., Disp: , Rfl:    fluticasone  (FLONASE ) 50 MCG/ACT nasal spray, Place 2 sprays into both nostrils daily., Disp: 16 g, Rfl: 0   glycopyrrolate  (ROBINUL ) 2 MG tablet, TAKE 1 TABLET BY MOUTH EVERY 12 HOURS AS NEEDED, Disp: 60 tablet, Rfl: 5   Green Tea, Camellia sinensis, (GREEN TEA 600) 600 MG CAPS, Take by mouth daily., Disp: , Rfl:  levocetirizine (XYZAL ) 5 MG tablet, Take 5 mg by mouth every evening. , Disp: , Rfl:    Magnesium Citrate 100 MG TABS, Take by mouth daily., Disp: , Rfl:    MELATONIN FAST DISSOLVE PO, Take by mouth at bedtime. 11/25/2022 Takes (2) 15 mg dissolvable tabs., Disp: , Rfl:    metFORMIN  (GLUCOPHAGE ) 500 MG tablet, Take 1 tablet by mouth once daily with breakfast, Disp: 90 tablet, Rfl: 0   methocarbamol  (ROBAXIN -750) 750 MG tablet, Take 1 tablet (750 mg total) by mouth every 6 (six) hours as needed for muscle spasms., Disp: 60 tablet, Rfl: 0   MIEBO 1.338 GM/ML SOLN, Apply 1 drop to eye 2 (two) times daily., Disp: , Rfl:    Misc Natural Products (JOINT HEALTH PO), Take by mouth daily., Disp: , Rfl:    montelukast  (SINGULAIR ) 10 MG tablet, Take 1 tablet by mouth every evening., Disp: , Rfl:    Multiple Vitamin (MULTIVITAMINS PO), Take  1 tablet by mouth daily., Disp: , Rfl:    Omega-3 Fatty Acids (FISH OIL PO), Take 500 mg by mouth daily., Disp: , Rfl:    ondansetron  (ZOFRAN -ODT) 4 MG disintegrating tablet, Take 1 tablet (4 mg total) by mouth every 8 (eight) hours as needed for nausea or vomiting., Disp: 20 tablet, Rfl: 0   OVER THE COUNTER MEDICATION, daily. Nature Made Stress Relief, Disp: , Rfl:    Plecanatide  (TRULANCE ) 3 MG TABS, Take 1 tablet (3 mg total) by mouth daily., Disp: 30 tablet, Rfl: 11   polyethylene glycol (MIRALAX  / GLYCOLAX ) packet, Take 17 g by mouth daily., Disp: , Rfl:    QUERCETIN PO, Take 1,000 mg by mouth daily., Disp: , Rfl:    Turmeric (QC TUMERIC COMPLEX) 500 MG CAPS, Take by mouth daily., Disp: , Rfl:    valACYclovir  (VALTREX ) 500 MG tablet, Take 1 tablet by mouth once daily, Disp: 90 tablet, Rfl: 0   ZINC-VITAMIN C PO, Take 2 capsules by mouth daily with breakfast., Disp: , Rfl:    Objective:     BP 116/78   Pulse 62   Temp 98.6 F (37 C)   Ht 5' 10 (1.778 m)   Wt 167 lb 6.4 oz (75.9 kg)   SpO2 98%   BMI 24.02 kg/m  BP Readings from Last 3 Encounters:  11/05/24 116/78  09/18/24 100/62  09/12/24 (!) 88/62   Wt Readings from Last 3 Encounters:  11/05/24 167 lb 6.4 oz (75.9 kg)  09/18/24 165 lb 12.8 oz (75.2 kg)  09/12/24 164 lb 6.4 oz (74.6 kg)      Physical Exam Constitutional:      General: She is not in acute distress.    Appearance: Normal appearance. She is not ill-appearing, toxic-appearing or diaphoretic.  HENT:     Head: Normocephalic and atraumatic.     Right Ear: External ear normal.     Left Ear: External ear normal.  Eyes:     General: No scleral icterus.       Right eye: No discharge.        Left eye: No discharge.     Extraocular Movements: Extraocular movements intact.     Conjunctiva/sclera: Conjunctivae normal.  Pulmonary:     Effort: Pulmonary effort is normal. No respiratory distress.  Skin:    General: Skin is warm and dry.  Neurological:      Mental Status: She is alert and oriented to person, place, and time.  Psychiatric:        Mood  and Affect: Mood normal.        Behavior: Behavior normal.      No results found for any visits on 11/05/24.    The 10-year ASCVD risk score (Arnett DK, et al., 2019) is: 4.4%    Assessment & Plan:   Prediabetes  Personal history of CLL (chronic lymphocytic leukemia)    Return in about 6 months (around 05/05/2025), or if symptoms worsen or fail to improve.  Continue all medications and scheduled follow-ups.  Continue exercise.  Avoid further weight gain.  Elsie Sim Lent, MD

## 2024-11-11 ENCOUNTER — Encounter: Payer: Self-pay | Admitting: Family Medicine

## 2024-11-11 ENCOUNTER — Ambulatory Visit: Admitting: Family Medicine

## 2024-11-11 DIAGNOSIS — Z856 Personal history of leukemia: Secondary | ICD-10-CM

## 2024-11-19 ENCOUNTER — Encounter: Payer: Self-pay | Admitting: Internal Medicine

## 2024-11-19 NOTE — Telephone Encounter (Signed)
 Ok to change back to Linzess  290 mcg daily -- this replaces Trulance  JMP

## 2024-11-20 ENCOUNTER — Other Ambulatory Visit: Payer: Self-pay

## 2024-11-20 MED ORDER — LINACLOTIDE 290 MCG PO CAPS: 290.0000 ug | ORAL_CAPSULE | Freq: Every day | ORAL | 3 refills | Status: AC

## 2024-11-22 ENCOUNTER — Encounter: Payer: Self-pay | Admitting: Hematology & Oncology

## 2024-11-22 ENCOUNTER — Other Ambulatory Visit: Payer: Self-pay

## 2024-11-22 ENCOUNTER — Ambulatory Visit: Admitting: Hematology & Oncology

## 2024-11-22 ENCOUNTER — Inpatient Hospital Stay: Attending: Hematology & Oncology

## 2024-11-22 VITALS — BP 106/59 | HR 51 | Temp 98.7°F | Resp 16 | Ht 70.0 in | Wt 165.0 lb

## 2024-11-22 DIAGNOSIS — C9110 Chronic lymphocytic leukemia of B-cell type not having achieved remission: Secondary | ICD-10-CM

## 2024-11-22 LAB — CMP (CANCER CENTER ONLY)
ALT: 19 U/L (ref 0–44)
AST: 24 U/L (ref 15–41)
Albumin: 4.7 g/dL (ref 3.5–5.0)
Alkaline Phosphatase: 63 U/L (ref 38–126)
Anion gap: 9 (ref 5–15)
BUN: 27 mg/dL — ABNORMAL HIGH (ref 8–23)
CO2: 29 mmol/L (ref 22–32)
Calcium: 9.9 mg/dL (ref 8.9–10.3)
Chloride: 103 mmol/L (ref 98–111)
Creatinine: 0.75 mg/dL (ref 0.44–1.00)
GFR, Estimated: >60 mL/min (ref >60)
Glucose, Bld: 82 mg/dL (ref 70–99)
Potassium: 3.8 mmol/L (ref 3.5–5.1)
Sodium: 141 mmol/L (ref 135–145)
Total Bilirubin: 0.4 mg/dL (ref 0.0–1.2)
Total Protein: 6.9 g/dL (ref 6.5–8.1)

## 2024-11-22 LAB — CBC WITH DIFFERENTIAL (CANCER CENTER ONLY)
Abs Immature Granulocytes: 0.02 K/uL (ref 0.00–0.07)
Basophils Absolute: 0.1 K/uL (ref 0.0–0.1)
Basophils Relative: 0 %
Eosinophils Absolute: 0.2 K/uL (ref 0.0–0.5)
Eosinophils Relative: 1 %
HCT: 42.7 % (ref 36.0–46.0)
Hemoglobin: 14.1 g/dL (ref 12.0–15.0)
Immature Granulocytes: 0 %
Lymphocytes Relative: 73 %
Lymphs Abs: 14.6 K/uL — ABNORMAL HIGH (ref 0.7–4.0)
MCH: 31.8 pg (ref 26.0–34.0)
MCHC: 33 g/dL (ref 30.0–36.0)
MCV: 96.4 fL (ref 80.0–100.0)
Monocytes Absolute: 0.8 K/uL (ref 0.1–1.0)
Monocytes Relative: 4 %
Neutro Abs: 4.5 K/uL (ref 1.7–7.7)
Neutrophils Relative %: 22 %
Platelet Count: 314 K/uL (ref 150–400)
RBC: 4.43 MIL/uL (ref 3.87–5.11)
RDW: 12.5 % (ref 11.5–15.5)
Smear Review: NORMAL
WBC Count: 20.2 K/uL — ABNORMAL HIGH (ref 4.0–10.5)
nRBC: 0.1 % (ref 0.0–0.2)

## 2024-11-22 LAB — SAVE SMEAR(SSMR), FOR PROVIDER SLIDE REVIEW

## 2024-11-22 LAB — LACTATE DEHYDROGENASE: LDH: 172 U/L (ref 105–235)

## 2024-11-22 NOTE — Progress Notes (Signed)
 Hematology and Oncology Follow Up Visit  [name] [id number] 1961-07-03 63 y.o. 11/22/2024   Principle Diagnosis:  CLL-  Stage A  --  13q-/ IVGH mutated  Current Therapy:   Observation     Interim History:  Ms. Moder is back for a follow-up.  We see her every 6 months.  The big news is that she is going to go part-time.  I am so happy for.  She is going to go part-time in about a week or so.  She really is looking forward to this.  She says that should be able to exercise more.  Since we last saw her, she has been doing quite well.  She has had no problems with fever.  She has had a couple bouts of sinusitis.  She has had no issues with nausea or vomiting.  She has had no change in bowel or bladder habits.  She has had no leg swelling.  She has had no rashes.  She has had no bleeding.  I know that she is quite tired.  She is trying to work a 4-1/2 days and work 40 hours in those days.  She will probably rest over the weekend.  Overall, I would have to say that her performance status is probably ECOG 1.    Medications:  Current Outpatient Medications:    linaclotide  (LINZESS ) 290 MCG CAPS capsule, Take 1 capsule (290 mcg total) by mouth daily before breakfast., Disp: 90 capsule, Rfl: 3   acetaminophen  (TYLENOL ) 500 MG tablet, Take 1,000 mg by mouth every 6 (six) hours as needed for mild pain., Disp: , Rfl:    Azelastine  HCl 137 MCG/SPRAY SOLN, Place 1 spray into both nostrils as needed., Disp: , Rfl:    b complex vitamins capsule, Take 1 capsule by mouth daily., Disp: , Rfl:    buPROPion  (WELLBUTRIN  XL) 150 MG 24 hr tablet, Take 1 tablet (150 mg total) by mouth every morning., Disp: 90 tablet, Rfl: 1   Calcium Carb-Cholecalciferol 600-800 MG-UNIT TABS, Take 1 tablet by mouth 2 (two) times daily., Disp: , Rfl:    clindamycin (CLEOCIN T) 1 % external solution, Apply 1 Application topically daily., Disp: , Rfl:    EPIPEN  2-PAK 0.3 MG/0.3ML SOAJ injection, , Disp: , Rfl:     estradiol (ESTRACE) 0.1 MG/GM vaginal cream, Place 1 g vaginally 2 (two) times a week., Disp: , Rfl:    fluticasone  (FLONASE ) 50 MCG/ACT nasal spray, Place 2 sprays into both nostrils daily., Disp: 16 g, Rfl: 0   glycopyrrolate  (ROBINUL ) 2 MG tablet, TAKE 1 TABLET BY MOUTH EVERY 12 HOURS AS NEEDED, Disp: 60 tablet, Rfl: 5   Green Tea, Camellia sinensis, (GREEN TEA 600) 600 MG CAPS, Take by mouth daily., Disp: , Rfl:    levocetirizine (XYZAL ) 5 MG tablet, Take 5 mg by mouth every evening. , Disp: , Rfl:    Magnesium Citrate 100 MG TABS, Take by mouth daily., Disp: , Rfl:    MELATONIN FAST DISSOLVE PO, Take by mouth at bedtime. 11/25/2022 Takes (2) 15 mg dissolvable tabs., Disp: , Rfl:    metFORMIN  (GLUCOPHAGE ) 500 MG tablet, Take 1 tablet by mouth once daily with breakfast, Disp: 90 tablet, Rfl: 0   methocarbamol  (ROBAXIN -750) 750 MG tablet, Take 1 tablet (750 mg total) by mouth every 6 (six) hours as needed for muscle spasms., Disp: 60 tablet, Rfl: 0   MIEBO 1.338 GM/ML SOLN, Apply 1 drop to eye 2 (two) times daily., Disp: , Rfl:  Misc Natural Products (JOINT HEALTH PO), Take by mouth daily., Disp: , Rfl:    montelukast  (SINGULAIR ) 10 MG tablet, Take 1 tablet by mouth every evening., Disp: , Rfl:    Multiple Vitamin (MULTIVITAMINS PO), Take 1 tablet by mouth daily., Disp: , Rfl:    Omega-3 Fatty Acids (FISH OIL PO), Take 500 mg by mouth daily., Disp: , Rfl:    ondansetron  (ZOFRAN -ODT) 4 MG disintegrating tablet, Take 1 tablet (4 mg total) by mouth every 8 (eight) hours as needed for nausea or vomiting., Disp: 20 tablet, Rfl: 0   OVER THE COUNTER MEDICATION, daily. Nature Made Stress Relief, Disp: , Rfl:    polyethylene glycol (MIRALAX  / GLYCOLAX ) packet, Take 17 g by mouth daily., Disp: , Rfl:    QUERCETIN PO, Take 1,000 mg by mouth daily., Disp: , Rfl:    Turmeric (QC TUMERIC COMPLEX) 500 MG CAPS, Take by mouth daily., Disp: , Rfl:    valACYclovir  (VALTREX ) 500 MG tablet, Take 1 tablet by mouth  once daily, Disp: 90 tablet, Rfl: 0   ZINC-VITAMIN C PO, Take 2 capsules by mouth daily with breakfast., Disp: , Rfl:   Allergies:  Allergies  Allergen Reactions   Chlordiazepoxide-Clidinium Other (See Comments)    loopy   Codeine Nausea And Vomiting    Dizziness   Penicillins Hives    Past Medical History, Surgical history, Social history, and Family History were reviewed and updated.  Review of Systems: Review of Systems  Constitutional: Negative.   HENT:  Negative.    Eyes: Negative.   Respiratory: Negative.    Cardiovascular: Negative.   Gastrointestinal: Negative.   Endocrine: Negative.   Genitourinary: Negative.    Musculoskeletal: Negative.   Skin: Negative.   Neurological: Negative.   Hematological: Negative.   Psychiatric/Behavioral: Negative.      Physical Exam:  Vital signs are temperature 98.7.  Pulse 51.  Blood pressure 106/59.  Weight is 165 pounds.   Wt Readings from Last 3 Encounters:  11/22/24 165 lb (74.8 kg)  11/05/24 167 lb 6.4 oz (75.9 kg)  09/18/24 165 lb 12.8 oz (75.2 kg)    Physical Exam Vitals reviewed.  HENT:     Head: Normocephalic and atraumatic.  Eyes:     Pupils: Pupils are equal, round, and reactive to light.  Cardiovascular:     Rate and Rhythm: Normal rate and regular rhythm.     Heart sounds: Normal heart sounds.  Pulmonary:     Effort: Pulmonary effort is normal.     Breath sounds: Normal breath sounds.  Abdominal:     General: Bowel sounds are normal.     Palpations: Abdomen is soft.  Musculoskeletal:        General: No tenderness or deformity. Normal range of motion.     Cervical back: Normal range of motion.  Lymphadenopathy:     Cervical: No cervical adenopathy.  Skin:    General: Skin is warm and dry.     Findings: No erythema or rash.  Neurological:     Mental Status: She is alert and oriented to person, place, and time.  Psychiatric:        Behavior: Behavior normal.        Thought Content: Thought  content normal.        Judgment: Judgment normal.      Lab Results  Component Value Date   WBC 20.2 (H) 11/22/2024   HGB 14.1 11/22/2024   HCT 42.7 11/22/2024   MCV 96.4 11/22/2024  PLT 314 11/22/2024     Chemistry      Component Value Date/Time   NA 141 11/22/2024 1415   K 3.8 11/22/2024 1415   CL 103 11/22/2024 1415   CO2 29 11/22/2024 1415   BUN 27 (H) 11/22/2024 1415   CREATININE 0.75 11/22/2024 1415      Component Value Date/Time   CALCIUM 9.9 11/22/2024 1415   ALKPHOS 63 11/22/2024 1415   AST 24 11/22/2024 1415   ALT 19 11/22/2024 1415   BILITOT 0.4 11/22/2024 1415      Impression and Plan: Ms. Tarlton is a very charming 63 year old white female.  She has CLL.  I think this is a low risk CLL.   We will still plan to get her back in 6 months.  I really think that she has low risk disease and I hope that we will not have to do any treatment for several more years.  I am so happy that she is going to be able to work part-time.  This will allow her to have a better quality of life.   12/12/20253:14 PM

## 2024-12-11 ENCOUNTER — Ambulatory Visit: Payer: Self-pay

## 2024-12-11 NOTE — Telephone Encounter (Signed)
 FYI Only or Action Required?: FYI only for provider: appointment scheduled on 12/13/24. UC advised if symptoms worsen.   Patient was last seen in primary care on 11/05/2024 by Berneta Elsie Sayre, MD.  Called Nurse Triage reporting Dysuria.  Symptoms began several days ago.  Interventions attempted: Rest, hydration, or home remedies.  Symptoms are: unchanged.  Triage Disposition: See HCP Within 4 Hours (Or PCP Triage)  Patient/caregiver understands and will follow disposition?: Yes  Reason for Disposition  Side (flank) or lower back pain present  Answer Assessment - Initial Assessment Questions Patient states that she started to have burning and pressure with urination a few days ago followed by mild back pain. She also reports cloudy urine. Denies any fevers. Office visit advised. Advised to go to UC/ED if symptoms worsen.   1. SEVERITY: How bad is the pain?  (e.g., Scale 1-10; mild, moderate, or severe)     Mild-moderate  2. FREQUENCY: How many times have you had painful urination today?     With each urination  3. PATTERN: Is pain present every time you urinate or just sometimes?      Yes  4. ONSET: When did the painful urination start?      A few days ago  5. FEVER: Do you have a fever? If Yes, ask: What is your temperature, how was it measured, and when did it start?     No  6. PAST UTI: Have you had a urine infection before? If Yes, ask: When was the last time? and What happened that time?      Unknown  7. CAUSE: What do you think is causing the painful urination?  (e.g., UTI, scratch, Herpes sore)     UTI  8. OTHER SYMPTOMS: Do you have any other symptoms? (e.g., blood in urine, flank pain, genital sores, urgency, vaginal discharge)    Mild back pain, cloudy urine  9. PREGNANCY: Is there any chance you are pregnant? When was your last menstrual period?     NA  Protocols used: Urination Pain - Female-A-AH  Copied from CRM #[id number].  Topic: Clinical - Red Word Triage >> Dec 11, 2024  4:02 PM Aisha D wrote: Red Word that prompted transfer to Nurse Triage: burning and pressure  Pt stated that she is experiencing a burning sensation and pressure. Pt thinks she has a UTI and would like to get an appt scheduled with her PCP.

## 2024-12-13 ENCOUNTER — Ambulatory Visit (INDEPENDENT_AMBULATORY_CARE_PROVIDER_SITE_OTHER): Admitting: Family Medicine

## 2024-12-13 ENCOUNTER — Encounter: Payer: Self-pay | Admitting: Family Medicine

## 2024-12-13 VITALS — BP 110/62 | HR 68 | Temp 98.7°F | Ht 70.0 in | Wt 164.2 lb

## 2024-12-13 DIAGNOSIS — R300 Dysuria: Secondary | ICD-10-CM | POA: Diagnosis not present

## 2024-12-13 DIAGNOSIS — N3001 Acute cystitis with hematuria: Secondary | ICD-10-CM | POA: Diagnosis not present

## 2024-12-13 LAB — POC URINALSYSI DIPSTICK (AUTOMATED)
Bilirubin, UA: NEGATIVE
Glucose, UA: NEGATIVE
Ketones, UA: NEGATIVE
Nitrite, UA: POSITIVE
Protein, UA: POSITIVE — AB
Spec Grav, UA: 1.01
Urobilinogen, UA: 0.2 U/dL
pH, UA: 7

## 2024-12-13 MED ORDER — SULFAMETHOXAZOLE-TRIMETHOPRIM 800-160 MG PO TABS: 1.0000 | ORAL_TABLET | Freq: Two times a day (BID) | ORAL | 0 refills | Status: AC

## 2024-12-13 NOTE — Telephone Encounter (Signed)
 Noted. Appointment today with Dr. Berneta

## 2024-12-13 NOTE — Progress Notes (Signed)
 I  Established Patient Office Visit   Subjective:  Patient ID: [name], female    DOB: 1961/04/20  Age: 64 y.o. MRN: [id number]  Chief Complaint  Patient presents with   Urinary Tract Infection    For 5 days, Burning, lower back pain frequently urinating, hurts to urinate     Urinary Tract Infection  Associated symptoms include frequency. Pertinent negatives include no hematuria or urgency.   Encounter Diagnoses  Name Primary?   Dysuria Yes   Acute cystitis with hematuria    4-day history of urinary frequency with dysuria occurring primarily at towards the end of her void.  Mild lower back pain.  No fevers chills nausea or vomiting.   Review of Systems  Constitutional: Negative.   HENT: Negative.    Eyes:  Negative for blurred vision, discharge and redness.  Respiratory: Negative.    Cardiovascular: Negative.   Gastrointestinal:  Negative for abdominal pain.  Genitourinary:  Positive for dysuria and frequency. Negative for hematuria and urgency.  Musculoskeletal: Negative.  Negative for myalgias.  Skin:  Negative for rash.  Neurological:  Negative for tingling, loss of consciousness and weakness.  Endo/Heme/Allergies:  Negative for polydipsia.    Current Medications[1]   Objective:     BP 110/62   Pulse 68   Temp 98.7 F (37.1 C) (Temporal)   Ht 5' 10 (1.778 m)   Wt 164 lb 3.2 oz (74.5 kg)   SpO2 98%   BMI 23.56 kg/m    Physical Exam Cardiovascular:     Rate and Rhythm: Normal rate and regular rhythm.  Pulmonary:     Effort: Pulmonary effort is normal. No respiratory distress.     Breath sounds: Normal breath sounds. No wheezing, rhonchi or rales.  Abdominal:     Tenderness: There is no right CVA tenderness or left CVA tenderness.      Results for orders placed or performed in visit on 12/13/24  POCT Urinalysis Dipstick (Automated)  Result Value Ref Range   Color, UA     Clarity, UA     Glucose, UA Negative Negative   Bilirubin, UA  negative    Ketones, UA negative    Spec Grav, UA 1.010 1.010 - 1.025   Blood, UA 1+    pH, UA 7.0 5.0 - 8.0   Protein, UA Positive (A) Negative   Urobilinogen, UA 0.2 0.2 or 1.0 E.U./dL   Nitrite, UA positive    Leukocytes, UA Large (3+) (A) Negative      The 10-year ASCVD risk score (Arnett DK, et al., 2019) is: 4%    Assessment & Plan:   Dysuria -     POCT Urinalysis Dipstick (Automated)  Acute cystitis with hematuria -     Urine Culture -     Sulfamethoxazole-Trimethoprim; Take 1 tablet by mouth 2 (two) times daily for 10 days.  Dispense: 20 tablet; Refill: 0    Return if symptoms worsen or fail to improve.    Elsie Sim Lent, MD    [1]  Current Outpatient Medications:    acetaminophen  (TYLENOL ) 500 MG tablet, Take 1,000 mg by mouth every 6 (six) hours as needed for mild pain., Disp: , Rfl:    Azelastine  HCl 137 MCG/SPRAY SOLN, Place 1 spray into both nostrils as needed., Disp: , Rfl:    b complex vitamins capsule, Take 1 capsule by mouth daily., Disp: , Rfl:    buPROPion  (WELLBUTRIN  XL) 150 MG 24 hr tablet, Take 1 tablet (150 mg  total) by mouth every morning., Disp: 90 tablet, Rfl: 1   Calcium Carb-Cholecalciferol 600-800 MG-UNIT TABS, Take 1 tablet by mouth 2 (two) times daily., Disp: , Rfl:    EPIPEN 2-PAK 0.3 MG/0.3ML SOAJ injection, , Disp: , Rfl:    estradiol (ESTRACE) 0.1 MG/GM vaginal cream, Place 1 g vaginally 2 (two) times a week., Disp: , Rfl:    fluticasone  (FLONASE ) 50 MCG/ACT nasal spray, Place 2 sprays into both nostrils daily., Disp: 16 g, Rfl: 0   glycopyrrolate  (ROBINUL ) 2 MG tablet, TAKE 1 TABLET BY MOUTH EVERY 12 HOURS AS NEEDED, Disp: 60 tablet, Rfl: 5   Green Tea, Camellia sinensis, (GREEN TEA 600) 600 MG CAPS, Take by mouth daily., Disp: , Rfl:    levocetirizine (XYZAL ) 5 MG tablet, Take 5 mg by mouth every evening. , Disp: , Rfl:    linaclotide  (LINZESS ) 290 MCG CAPS capsule, Take 1 capsule (290 mcg total) by mouth daily before  breakfast. (Patient not taking: Reported on 12/13/2024), Disp: 90 capsule, Rfl: 3   Magnesium Citrate 100 MG TABS, Take by mouth daily., Disp: , Rfl:    MELATONIN FAST DISSOLVE PO, Take by mouth at bedtime. 11/25/2022 Takes (2) 15 mg dissolvable tabs., Disp: , Rfl:    metFORMIN  (GLUCOPHAGE ) 500 MG tablet, Take 1 tablet by mouth once daily with breakfast, Disp: 90 tablet, Rfl: 0   methocarbamol  (ROBAXIN -750) 750 MG tablet, Take 1 tablet (750 mg total) by mouth every 6 (six) hours as needed for muscle spasms., Disp: 60 tablet, Rfl: 0   MIEBO 1.338 GM/ML SOLN, Apply 1 drop to eye 2 (two) times daily., Disp: , Rfl:    Misc Natural Products (JOINT HEALTH PO), Take by mouth daily., Disp: , Rfl:    montelukast  (SINGULAIR ) 10 MG tablet, Take 1 tablet by mouth every evening., Disp: , Rfl:    Multiple Vitamin (MULTIVITAMINS PO), Take 1 tablet by mouth daily., Disp: , Rfl:    Omega-3 Fatty Acids (FISH OIL PO), Take 500 mg by mouth daily., Disp: , Rfl:    ondansetron  (ZOFRAN -ODT) 4 MG disintegrating tablet, Take 1 tablet (4 mg total) by mouth every 8 (eight) hours as needed for nausea or vomiting., Disp: 20 tablet, Rfl: 0   OVER THE COUNTER MEDICATION, daily. Nature Made Stress Relief, Disp: , Rfl:    polyethylene glycol (MIRALAX  / GLYCOLAX ) packet, Take 17 g by mouth daily., Disp: , Rfl:    QUERCETIN PO, Take 1,000 mg by mouth daily., Disp: , Rfl:    sulfamethoxazole-trimethoprim (BACTRIM DS) 800-160 MG tablet, Take 1 tablet by mouth 2 (two) times daily for 10 days., Disp: 20 tablet, Rfl: 0   Turmeric (QC TUMERIC COMPLEX) 500 MG CAPS, Take by mouth daily., Disp: , Rfl:    valACYclovir  (VALTREX ) 500 MG tablet, Take 1 tablet by mouth once daily, Disp: 90 tablet, Rfl: 0   ZINC-VITAMIN C PO, Take 2 capsules by mouth daily with breakfast., Disp: , Rfl:    clindamycin (CLEOCIN T) 1 % external solution, Apply 1 Application topically daily. (Patient not taking: Reported on 12/13/2024), Disp: , Rfl:

## 2024-12-15 LAB — URINE CULTURE
MICRO NUMBER:: 17419756
SPECIMEN QUALITY:: ADEQUATE

## 2024-12-16 NOTE — Telephone Encounter (Signed)
 She could try Senokot-S or the generic 2 tablets nightly MiraLAX  can be added with this if needed JMP

## 2024-12-17 ENCOUNTER — Other Ambulatory Visit: Payer: Self-pay | Admitting: Internal Medicine

## 2024-12-22 ENCOUNTER — Other Ambulatory Visit: Payer: Self-pay | Admitting: Family Medicine

## 2024-12-22 DIAGNOSIS — R635 Abnormal weight gain: Secondary | ICD-10-CM

## 2024-12-22 DIAGNOSIS — A609 Anogenital herpesviral infection, unspecified: Secondary | ICD-10-CM

## 2024-12-22 DIAGNOSIS — R7309 Other abnormal glucose: Secondary | ICD-10-CM

## 2025-01-16 ENCOUNTER — Other Ambulatory Visit: Payer: Self-pay | Admitting: Family Medicine

## 2025-01-16 ENCOUNTER — Encounter: Admitting: Family Medicine

## 2025-01-16 DIAGNOSIS — F418 Other specified anxiety disorders: Secondary | ICD-10-CM

## 2025-01-17 ENCOUNTER — Other Ambulatory Visit: Payer: Self-pay | Admitting: Internal Medicine

## 2025-02-24 ENCOUNTER — Ambulatory Visit: Admitting: Family Medicine

## 2025-05-22 ENCOUNTER — Inpatient Hospital Stay

## 2025-05-22 ENCOUNTER — Inpatient Hospital Stay: Admitting: Hematology & Oncology

## 2025-08-04 ENCOUNTER — Encounter: Admitting: Family Medicine
